# Patient Record
Sex: Male | Born: 1955 | Race: White | Hispanic: No | Marital: Married | Smoking: Never smoker
Health system: Southern US, Community
[De-identification: ages and names within clinical notes are randomized; demographics above are authoritative.]

## PROBLEM LIST (undated history)

## (undated) DIAGNOSIS — E559 Vitamin D deficiency, unspecified: Secondary | ICD-10-CM

## (undated) DIAGNOSIS — R011 Cardiac murmur, unspecified: Secondary | ICD-10-CM

## (undated) DIAGNOSIS — M1990 Unspecified osteoarthritis, unspecified site: Secondary | ICD-10-CM

## (undated) DIAGNOSIS — I2510 Atherosclerotic heart disease of native coronary artery without angina pectoris: Secondary | ICD-10-CM

## (undated) DIAGNOSIS — M109 Gout, unspecified: Secondary | ICD-10-CM

## (undated) DIAGNOSIS — E669 Obesity, unspecified: Secondary | ICD-10-CM

## (undated) DIAGNOSIS — I10 Essential (primary) hypertension: Secondary | ICD-10-CM

## (undated) DIAGNOSIS — E119 Type 2 diabetes mellitus without complications: Secondary | ICD-10-CM

## (undated) DIAGNOSIS — E785 Hyperlipidemia, unspecified: Secondary | ICD-10-CM

## (undated) DIAGNOSIS — N529 Male erectile dysfunction, unspecified: Secondary | ICD-10-CM

## (undated) HISTORY — DX: Obesity, unspecified: E66.9

## (undated) HISTORY — DX: Vitamin D deficiency, unspecified: E55.9

## (undated) HISTORY — PX: NO PAST SURGERIES: SHX2092

## (undated) HISTORY — DX: Type 2 diabetes mellitus without complications: E11.9

## (undated) HISTORY — PX: CARDIAC CATHETERIZATION: SHX172

## (undated) HISTORY — DX: Male erectile dysfunction, unspecified: N52.9

## (undated) HISTORY — DX: Hyperlipidemia, unspecified: E78.5

## (undated) HISTORY — DX: Gout, unspecified: M10.9

## (undated) HISTORY — DX: Unspecified osteoarthritis, unspecified site: M19.90

---

## 2015-01-07 ENCOUNTER — Other Ambulatory Visit: Payer: Self-pay | Admitting: Family Medicine

## 2015-01-07 ENCOUNTER — Ambulatory Visit (INDEPENDENT_AMBULATORY_CARE_PROVIDER_SITE_OTHER): Payer: No Typology Code available for payment source | Admitting: Family Medicine

## 2015-01-07 ENCOUNTER — Encounter: Payer: Self-pay | Admitting: Family Medicine

## 2015-01-07 VITALS — BP 130/72 | HR 72 | Temp 98.4°F | Resp 16 | Ht 71.5 in | Wt 280.8 lb

## 2015-01-07 DIAGNOSIS — E119 Type 2 diabetes mellitus without complications: Secondary | ICD-10-CM

## 2015-01-07 DIAGNOSIS — H6982 Other specified disorders of Eustachian tube, left ear: Secondary | ICD-10-CM | POA: Diagnosis not present

## 2015-01-07 DIAGNOSIS — N521 Erectile dysfunction due to diseases classified elsewhere: Secondary | ICD-10-CM | POA: Diagnosis not present

## 2015-01-07 DIAGNOSIS — N529 Male erectile dysfunction, unspecified: Secondary | ICD-10-CM

## 2015-01-07 DIAGNOSIS — E1169 Type 2 diabetes mellitus with other specified complication: Secondary | ICD-10-CM

## 2015-01-07 LAB — POCT GLYCOSYLATED HEMOGLOBIN (HGB A1C): Hemoglobin A1C: 7.2

## 2015-01-07 MED ORDER — SILDENAFIL CITRATE 50 MG PO TABS: 50.0000 mg | ORAL_TABLET | Freq: Every day | ORAL | Status: DC | PRN

## 2015-01-07 MED ORDER — FLUTICASONE PROPIONATE 50 MCG/ACT NA SUSP: 2.0000 | Freq: Every day | NASAL | Status: DC

## 2015-01-07 MED ORDER — METFORMIN HCL 500 MG PO TABS: 500.0000 mg | ORAL_TABLET | Freq: Two times a day (BID) | ORAL | Status: DC

## 2015-01-07 MED ORDER — SILDENAFIL CITRATE 20 MG PO TABS: ORAL_TABLET | ORAL | Status: DC

## 2015-01-07 NOTE — Progress Notes (Signed)
Subjective:     Patient ID: Jeffery West, male   DOB: [birth year], 59 y.o.   MRN: [id number]  HPI  Chief Complaint  Patient presents with  . Dizziness    Patient states that he woke up this morning and felt light headed. [name] states that he has had sinus pain/pressure above his eyes and that his left ear has felt "stopped up" for months.   States light headed feeling has improved. 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 He has been lost to f/u for diabetes, has gained weight and is no longer on medication (invokana) due to side effects. Also wishes refill on Viagra.   Review of Systems     Objective:   Physical Exam  Constitutional: He appears well-developed and well-nourished. No distress.  Ears: T.M's dull without inflammation Sinuses: mild frontal sinus tenderness Throat: no tonsillar enlargement or exudate Neck: no cervical adenopathy Lungs: clear     Assessment:    1. Type 2 diabetes mellitus without complication - POCT glycosylated hemoglobin (Hb A1C) - metFORMIN (GLUCOPHAGE) 500 MG tablet; Take 1 tablet (500 mg total) by mouth 2 (two) times daily with a meal.  Dispense: 60 tablet; Refill: 2  2. Eustachian tube dysfunction, left - fluticasone (FLONASE) 50 MCG/ACT nasal spray; Place 2 sprays into both nostrils daily.  Dispense: 16 g; Refill: 1  3. Erectile dysfunction associated with type 2 diabetes mellitus - sildenafil (VIAGRA) 50 MG tablet; Take 1 tablet (50 mg total) by mouth daily as needed for erectile dysfunction.  Dispense: 10 tablet; Refill: 2    Plan:    Schedule f/u or physical with your primary M.D, Dr. Rosanna Randy.

## 2015-01-07 NOTE — Patient Instructions (Signed)
Please schedule physical with Dr. Rosanna Randy

## 2015-01-24 ENCOUNTER — Encounter: Payer: Self-pay | Admitting: Family Medicine

## 2015-02-24 ENCOUNTER — Emergency Department: Payer: No Typology Code available for payment source

## 2015-02-24 ENCOUNTER — Emergency Department
Admission: EM | Admit: 2015-02-24 | Discharge: 2015-02-24 | Disposition: A | Discharge status: Home / Self Care | Payer: No Typology Code available for payment source | Attending: Emergency Medicine | Admitting: Emergency Medicine

## 2015-02-24 ENCOUNTER — Encounter: Payer: Self-pay | Admitting: Emergency Medicine

## 2015-02-24 DIAGNOSIS — Y9289 Other specified places as the place of occurrence of the external cause: Secondary | ICD-10-CM | POA: Diagnosis not present

## 2015-02-24 DIAGNOSIS — E1169 Type 2 diabetes mellitus with other specified complication: Secondary | ICD-10-CM | POA: Diagnosis not present

## 2015-02-24 DIAGNOSIS — Y990 Civilian activity done for income or pay: Secondary | ICD-10-CM | POA: Diagnosis not present

## 2015-02-24 DIAGNOSIS — Z79811 Long term (current) use of aromatase inhibitors: Secondary | ICD-10-CM | POA: Diagnosis not present

## 2015-02-24 DIAGNOSIS — X58XXXA Exposure to other specified factors, initial encounter: Secondary | ICD-10-CM | POA: Insufficient documentation

## 2015-02-24 DIAGNOSIS — Z79899 Other long term (current) drug therapy: Secondary | ICD-10-CM | POA: Insufficient documentation

## 2015-02-24 DIAGNOSIS — N521 Erectile dysfunction due to diseases classified elsewhere: Secondary | ICD-10-CM | POA: Insufficient documentation

## 2015-02-24 DIAGNOSIS — Y93F2 Activity, caregiving, lifting: Secondary | ICD-10-CM | POA: Diagnosis not present

## 2015-02-24 DIAGNOSIS — R1030 Lower abdominal pain, unspecified: Secondary | ICD-10-CM | POA: Diagnosis present

## 2015-02-24 DIAGNOSIS — Z880 Allergy status to penicillin: Secondary | ICD-10-CM | POA: Insufficient documentation

## 2015-02-24 DIAGNOSIS — S76212A Strain of adductor muscle, fascia and tendon of left thigh, initial encounter: Secondary | ICD-10-CM | POA: Insufficient documentation

## 2015-02-24 DIAGNOSIS — S76219A Strain of adductor muscle, fascia and tendon of unspecified thigh, initial encounter: Secondary | ICD-10-CM

## 2015-02-24 MED ORDER — CYCLOBENZAPRINE HCL 10 MG PO TABS
10.0000 mg | ORAL_TABLET | Freq: Once | ORAL | Status: AC
  Administered 2015-02-24: 10 mg via ORAL
  Filled 2015-02-24: qty 1

## 2015-02-24 MED ORDER — CYCLOBENZAPRINE HCL 10 MG PO TABS: 10.0000 mg | ORAL_TABLET | Freq: Three times a day (TID) | ORAL | Status: DC | PRN

## 2015-02-24 NOTE — Discharge Instructions (Signed)
Groin Strain °A groin strain (also called a groin pull) is an injury to the muscles or tendon on the upper inner part of the thigh. These muscles are called the adductor muscles or groin muscles. They are responsible for moving the leg across the body. A muscle strain occurs when a muscle is overstretched and some muscle fibers are torn. A groin strain can range from mild to severe depending on how many muscle fibers are affected and whether the muscle fibers are partially or completely torn.  °Groin strains usually occur during exercise or participation in sports. The injury often happens when a sudden, violent force is placed on a muscle, stretching the muscle too far. A strain is more likely to occur when your muscles are not warmed up or if you are not properly conditioned. Depending on the severity of the groin strain, recovery time may vary from a few weeks to several weeks. Severe injuries often require 4-6 weeks for recovery. In these cases, complete healing can take 4-5 months.  °CAUSES  °· Stretching the groin muscles too far or too suddenly, often during side-to-side motion with an abrupt change in direction. °· Putting repeated stress on the groin muscles over a long period of time. °· Performing vigorous activity without properly stretching the groin muscles beforehand. °SYMPTOMS  °· Pain and tenderness in the groin area. This begins as sharp pain and persists as a dull ache. °· Popping or snapping feeling when the injury occurs (for severe strains). °· Swelling or bruising. °· Muscle spasms. °· Weakness in the leg. °· Stiffness in the groin area with decreased ability to move the affected muscles. °DIAGNOSIS  °Your caregiver will perform a physical exam to diagnose a groin strain. You will be asked about your symptoms and how the injury occurred. X-rays are sometimes needed to rule out a broken bone or cartilage problems. Your caregiver may order a CT scan or MRI if a complete muscle tear is  suspected. °TREATMENT  °A groin strain will often heal on its own. Your caregiver may prescribe medicines to help manage pain and swelling (anti-inflammatory medicine). You may be told to use crutches for the first few days to minimize your pain. °HOME CARE INSTRUCTIONS  °· Rest. Do not use the strained muscle if it causes pain. °· Put ice on the injured area. °¨ Put ice in a plastic bag. °¨ Place a towel between your skin and the bag. °¨ Leave the ice on for 15-20 minutes, every 2-3 hours. Do this for the first 2 days after the injury.  °· Only take over-the-counter or prescription medicines as directed by your caregiver. °· Wrap the injured area with an elastic bandage as directed by your caregiver. °· Keep the injured leg raised (elevated). °· Walk, stretch, and perform range-of-motion exercises to improve blood flow to the injured area. Only perform these activities if you can do so without any pain. °To prevent muscle strains: °· Warm up before exercise. °· Develop proper conditioning and strength in the groin muscles. °SEEK IMMEDIATE MEDICAL CARE IF:  °· You have increased pain or swelling in the affected area.   °· Your symptoms are not improving or are getting worse. °MAKE SURE YOU:  °· Understand these instructions. °· Will watch your condition. °· Will get help right away if you are not doing well or get worse. °Document Released: 02/03/2004 Document Revised: 05/24/2012 Document Reviewed: 02/09/2012 °[employer]® Patient Information ©2015 [employer]. This information is not intended to replace advice given to you   by your health care provider. Make sure you discuss any questions you have with your health care provider. ° °

## 2015-02-24 NOTE — ED Notes (Signed)
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

## 2015-02-24 NOTE — ED Provider Notes (Signed)
[facility] Emergency Department Provider Note  ____________________________________________  Time seen: Approximately 1050AM  I have reviewed the triage vital signs and the nursing notes.   HISTORY  Chief Complaint Groin Pain    HPI Jeffery West is a 59 y.o. male with a history of diabetes who is presenting today with left-sided groin pain over the past 2 weeks. 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 However, over the past 2 weeks she has been having waxing and waning pain to his left groin and hip. He says that it is worse with movement, especially when standing up. He says that when he is lying still, like right now, he has no pain at all. He denies any urinary complaints. Has no history of kidney stones. He does admit to some left lower back pain as well but only mild. No loss of bowel or bladder continence. Has tried muscle relaxers at home as well as Naprosyn without relief.He denies any bulge to his groin or visible hernia sac.   Past Medical History  Diagnosis Date  . Diabetes mellitus without complication   . Vitamin D deficiency   . Hyperlipidemia   . Asthma   . Gout   . Osteoarthritis   . Erectile dysfunction   . Obesity     Patient Active Problem List   Diagnosis Date Noted  . Erectile dysfunction associated with type 2 diabetes mellitus 01/07/2015    History reviewed. No pertinent past surgical history.  Current Outpatient Rx  Name  Route  Sig  Dispense  Refill  . albuterol (PROVENTIL) (2.5 MG/3ML) 0.083% nebulizer solution   Inhalation   Inhale 2.5 mg into the lungs every 4 (four) hours as needed for wheezing or shortness of breath.         . allopurinol (ZYLOPRIM) 300 MG tablet   Oral   Take 300 mg by mouth daily.         . fluticasone (FLONASE) 50 MCG/ACT nasal spray   Each Nare   Place 2 sprays into both nostrils daily.   16 g   1   .  metFORMIN (GLUCOPHAGE) 500 MG tablet   Oral   Take 1 tablet (500 mg total) by mouth 2 (two) times daily with a meal.   60 tablet   2   . naproxen sodium (ANAPROX) 220 MG tablet   Oral   Take 220 mg by mouth daily.         . sildenafil (REVATIO) 20 MG tablet      Take 2-3 tablets daily as needed for E.D.   20 tablet   5   . simvastatin (ZOCOR) 20 MG tablet   Oral   Take 20 mg by mouth daily.           Allergies Penicillins  Family History  Problem Relation Age of Onset  . Cancer Father     possibly in liver and lung    Social History Social History  Substance Use Topics  . Smoking status: Never Smoker   . Smokeless tobacco: Never Used  . Alcohol Use: None    Review of Systems Constitutional: No fever/chills Eyes: No visual changes. ENT: No sore throat. Cardiovascular: Denies chest pain. Respiratory: Denies shortness of breath. Gastrointestinal: No abdominal pain.  No nausea, no vomiting.  No diarrhea.  No constipation. Genitourinary: Negative for dysuria. Musculoskeletal: As above Skin: Negative for  rash. Neurological: Negative for headaches, focal weakness or numbness.  10-point ROS otherwise negative.  ____________________________________________   PHYSICAL EXAM:  VITAL SIGNS: ED Triage Vitals  Enc Vitals Group     BP 02/24/15 0923 158/79 mmHg     Pulse Rate 02/24/15 0923 80     Resp 02/24/15 0923 18     Temp 02/24/15 0923 98.2 F (36.8 C)     Temp Source 02/24/15 0923 Oral     SpO2 02/24/15 0923 100 %     Weight 02/24/15 0923 270 lb (122.471 kg)     Height 02/24/15 0923 5\' 11"  (1.803 m)     Head Cir --      Peak Flow --      Pain Score 02/24/15 0923 8     Pain Loc --      Pain Edu? --      Excl. in [place]? --     Constitutional: Alert and oriented. Well appearing and in no acute distress. Eyes: Conjunctivae are normal. PERRL. EOMI. Head: Atraumatic. Nose: No congestion/rhinnorhea. Mouth/Throat: Mucous membranes are moist.  Oropharynx  non-erythematous. Neck: No stridor.   Cardiovascular: Normal rate, regular rhythm. Grossly normal heart sounds.  Good peripheral circulation. Respiratory: Normal respiratory effort.  No retractions. Lungs CTAB. Gastrointestinal: Soft and nontender. No distention. No abdominal bruits. No CVA tenderness. Genitourinary: No testicular tenderness or masses palpated. No hernia sac. Groin palpated with moderate point tenderness to the left inguinal area without any palpable mass. The tenderness is medial to the inguinal ligament. Musculoskeletal: No lower extremity tenderness nor edema.  No joint effusions. No tenderness to the back or step-off to the spine. Neurologic:  Normal speech and language. No Zentner focal neurologic deficits are appreciated. No gait instability. Able to ambulate without any visible signs of pain with a normal gait. Skin:  Skin is warm, dry and intact. No rash noted. Psychiatric: Mood and affect are normal. Speech and behavior are normal.  ____________________________________________   LABS (all labs ordered are listed, but only abnormal results are displayed)  Labs Reviewed - No data to display ____________________________________________  EKG   ____________________________________________  RADIOLOGY  No visible hernia sac on the ultrasound of the affected area. Hip x-ray with mild osteoarthritis bilaterally. Small 6 m sclerotic lesion in the left proximal femoral diaphysis likely presenting a bone island in the absence of known malignancy.  ____________________________________________   PROCEDURES  ____________________________________________   INITIAL IMPRESSION / ASSESSMENT AND PLAN / ED COURSE  Pertinent labs & imaging results that were available during my care of the patient were reviewed by me and considered in my medical decision making (see chart for details).  ----------------------------------------- 1:16 PM on  02/24/2015 -----------------------------------------  Patient is resting comfortable at this time. Reviewed the radiologic results with him including the x-ray with the bone island. He was given a copy of the radiology report will take with him to his primary care doctor appointment in 2 weeks. The pain does seem to be musculoskeletal in origin. 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 He continues to rest comfortably when not moving and reiterates that the pain is only with motion. I believe this represent groin strain. In addition to the patient's symptomatically treatment I also recommended rest. He will likely take 2-3 days off of work so that he may rest the muscles. ____________________________________________   FINAL CLINICAL IMPRESSION(S) / ED DIAGNOSES  Acute groin strain. Initial  visit.    Orbie Pyo, MD 02/24/15 [id number]

## 2015-02-24 NOTE — ED Notes (Signed)
Patient transported to Ultrasound 

## 2015-02-26 ENCOUNTER — Encounter: Payer: Self-pay | Admitting: Family Medicine

## 2015-02-26 ENCOUNTER — Telehealth: Payer: Self-pay | Admitting: Family Medicine

## 2015-02-26 ENCOUNTER — Ambulatory Visit (INDEPENDENT_AMBULATORY_CARE_PROVIDER_SITE_OTHER): Payer: No Typology Code available for payment source | Admitting: Family Medicine

## 2015-02-26 VITALS — BP 162/60 | HR 78 | Temp 97.5°F | Resp 16 | Wt 271.0 lb

## 2015-02-26 DIAGNOSIS — E785 Hyperlipidemia, unspecified: Secondary | ICD-10-CM | POA: Insufficient documentation

## 2015-02-26 DIAGNOSIS — F4320 Adjustment disorder, unspecified: Secondary | ICD-10-CM | POA: Insufficient documentation

## 2015-02-26 DIAGNOSIS — E66811 Obesity, class 1: Secondary | ICD-10-CM | POA: Insufficient documentation

## 2015-02-26 DIAGNOSIS — M1990 Unspecified osteoarthritis, unspecified site: Secondary | ICD-10-CM | POA: Insufficient documentation

## 2015-02-26 DIAGNOSIS — E1169 Type 2 diabetes mellitus with other specified complication: Secondary | ICD-10-CM | POA: Insufficient documentation

## 2015-02-26 DIAGNOSIS — N529 Male erectile dysfunction, unspecified: Secondary | ICD-10-CM | POA: Insufficient documentation

## 2015-02-26 DIAGNOSIS — J45909 Unspecified asthma, uncomplicated: Secondary | ICD-10-CM | POA: Insufficient documentation

## 2015-02-26 DIAGNOSIS — M109 Gout, unspecified: Secondary | ICD-10-CM | POA: Insufficient documentation

## 2015-02-26 DIAGNOSIS — E119 Type 2 diabetes mellitus without complications: Secondary | ICD-10-CM | POA: Insufficient documentation

## 2015-02-26 DIAGNOSIS — S39011S Strain of muscle, fascia and tendon of abdomen, sequela: Secondary | ICD-10-CM | POA: Diagnosis not present

## 2015-02-26 DIAGNOSIS — M549 Dorsalgia, unspecified: Secondary | ICD-10-CM | POA: Diagnosis not present

## 2015-02-26 DIAGNOSIS — E559 Vitamin D deficiency, unspecified: Secondary | ICD-10-CM | POA: Insufficient documentation

## 2015-02-26 DIAGNOSIS — S76219S Strain of adductor muscle, fascia and tendon of unspecified thigh, sequela: Secondary | ICD-10-CM

## 2015-02-26 DIAGNOSIS — J309 Allergic rhinitis, unspecified: Secondary | ICD-10-CM | POA: Insufficient documentation

## 2015-02-26 DIAGNOSIS — E1165 Type 2 diabetes mellitus with hyperglycemia: Secondary | ICD-10-CM | POA: Insufficient documentation

## 2015-02-26 MED ORDER — PREDNISONE 10 MG (48) PO TBPK: ORAL_TABLET | Freq: Every day | ORAL | Status: DC

## 2015-02-26 NOTE — Progress Notes (Signed)
Patient ID: Jeffery West, male   DOB: 22-Dec-1955, 58 y.o.   MRN: [id number]    Subjective:  HPI Pt was in the ER on 02/24/15 for left groin pain that had been going on 2 weeks prior to going to the ER. He had been lifting heavy objects at work. He reports that the pain is worse with movement or raising his leg. He has tried muscle relaxers and Naproxen. He describes the pain as a an "ache" and its not really too much pain until he gets up walks for goes from sitting to standing an vice versa.  The ER did labs and they were all normal. He had a u/s of the affected area and a xray of the right hip. A small 6 m sclerotic lesion in the left proximal femoral diaphysis likely presenting a bone island in the absence of known malignancy. Pt is still in pain today and is obvious in the room he can not stand or sit for long periods of time.   Prior to Admission medications   Medication Sig Start Date End Date Taking? Authorizing Provider  albuterol (PROAIR HFA) 108 (90 BASE) MCG/ACT inhaler Inhale into the lungs. 06/18/14  Yes Historical Provider, MD  allopurinol (ZYLOPRIM) 300 MG tablet Take by mouth. 05/06/14  Yes Historical Provider, MD  cyclobenzaprine (FLEXERIL) 10 MG tablet Take 1 tablet (10 mg total) by mouth 3 (three) times daily as needed for muscle spasms. 02/24/15  Yes Orbie Pyo, MD  fluticasone [facility]) 50 MCG/ACT nasal spray Place 2 sprays into both nostrils daily. 01/07/15  Yes Carmon Ginsberg, PA  loratadine ([facility]) 10 MG tablet Take by mouth.   Yes Historical Provider, MD  metFORMIN (GLUCOPHAGE) 500 MG tablet Take 1 tablet (500 mg total) by mouth 2 (two) times daily with a meal. 01/07/15  Yes Carmon Ginsberg, PA  Naproxen Sodium 220 MG CAPS Take by mouth.   Yes Historical Provider, MD  sildenafil (REVATIO) 20 MG tablet Take 2-3 tablets daily as needed for E.D. 01/07/15  Yes Carmon Ginsberg, PA  simvastatin (ZOCOR) 20 MG tablet Take by mouth. 04/26/14  Yes Historical Provider,  MD    Patient Active Problem List   Diagnosis Date Noted  . AB (asthmatic bronchitis) 02/26/2015  . Adaptation reaction 02/26/2015  . Allergic rhinitis 02/26/2015  . Diabetes 02/26/2015  . ED (erectile dysfunction) of organic origin 02/26/2015  . Gout 02/26/2015  . HLD (hyperlipidemia) 02/26/2015  . Adiposity 02/26/2015  . Arthritis, degenerative 02/26/2015  . Avitaminosis D 02/26/2015  . Erectile dysfunction associated with type 2 diabetes mellitus 01/07/2015    Past Medical History  Diagnosis Date  . Diabetes mellitus without complication   . Vitamin D deficiency   . Hyperlipidemia   . Asthma   . Gout   . Osteoarthritis   . Erectile dysfunction   . Obesity     Social History   Social History  . Marital Status: Married    Spouse Name: N/A  . Number of Children: N/A  . Years of Education: N/A   Occupational History  . Not on file.   Social History Main Topics  . Smoking status: Current Some Day Smoker    Types: Cigars  . Smokeless tobacco: Never Used     Comment: every once in a while  . Alcohol Use: 0.0 oz/week    0 Standard drinks or equivalent per week     Comment: occassionally  . Drug Use: No  . Sexual Activity: Not  on file   Other Topics Concern  . Not on file   Social History Narrative    Allergies  Allergen Reactions  . Penicillins Hives    Review of Systems  Constitutional: Negative.   HENT: Negative.   Eyes: Negative.   Respiratory: Negative.   Cardiovascular: Negative.   Gastrointestinal: Negative.   Genitourinary: Negative.   Musculoskeletal: Positive for myalgias and joint pain.  Skin: Negative.   Neurological: Negative.   Endo/Heme/Allergies: Negative.   Psychiatric/Behavioral: Negative.      There is no immunization history on file for this patient. Objective:  BP 162/60 mmHg  Pulse 78  Temp(Src) 97.5 F (36.4 C) (Oral)  Resp 16  Wt 271 lb (122.925 kg)  Physical Exam  Constitutional: He is oriented to person,  place, and time and well-developed, well-nourished, and in no distress.  Morbidly obese white male in no acute distress  HENT:  Head: Normocephalic and atraumatic.  Right Ear: External ear normal.  Left Ear: External ear normal.  Nose: Nose normal.  Eyes: Conjunctivae are normal.  Neck: Neck supple.  Cardiovascular: Normal rate, regular rhythm and normal heart sounds.   2/6 systolic murmur at the left lower sternal border  Pulmonary/Chest: Effort normal and breath sounds normal.  Abdominal: Soft.  Musculoskeletal: Normal range of motion.  Patient has full range of motion in his back and hip with negative figure-of-four maneuver. He does have some discomfort with active movement in his back and groin and hip flexors  Neurological: He is alert and oriented to person, place, and time.  Skin: Skin is warm and dry.  Psychiatric: Mood, memory, affect and judgment normal.    Lab Results  Component Value Date   HGBA1C 7.2 01/07/2015    CMP  No results found for: NA, K, CL, CO2, GLUCOSE, BUN, CREATININE, CALCIUM, PROT, ALBUMIN, AST, ALT, ALKPHOS, BILITOT, GFRNONAA, GFRAA  Assessment and Plan :  1. Groin strain, sequela I think this pain is all musculoskeletal pain. May need orthopedic referral or physical therapy referral but I do not think he'll need a repeat surgical intervention. I do not think he needs any further imaging at this time. - predniSONE (STERAPRED UNI-PAK 48 TAB) 10 MG (48) TBPK tablet; Take by mouth daily.  Dispense: 48 tablet; Refill: 0 - Ambulatory referral to Orthopedic Surgery  2. Back pain, unspecified location  - predniSONE (STERAPRED UNI-PAK 48 TAB) 10 MG (48) TBPK tablet; Take by mouth daily.  Dispense: 48 tablet; Refill: 0 - Ambulatory referral to Orthopedic Surgery 3. Morbid obesity with deconditioning Patient will return to work when he is able to. Discussed that he would do well to lose weight and strengthen his core as this may become a more frequent issue  for him  Miguel Aschoff MD [facility] 02/26/2015 2:39 PM

## 2015-02-26 NOTE — Telephone Encounter (Signed)
Pt wa in the ER Monday LKeft Hip pain.  They did [place] and Xray.  With some various findings.  They gave him flexaril and told him to follow up with you this week or next.  He says he needs to see you today.  He is in a lot of pain. Is there any time we can do

## 2015-02-28 NOTE — Telephone Encounter (Signed)
Information faxed and patient was seen on the 7th-aa

## 2015-02-28 NOTE — Telephone Encounter (Signed)
[facility] calling, [place], they need information  Faxed number [phone]. Need any OV notes pertaining to hip issues and his copy of insurance card also please.

## 2015-03-06 ENCOUNTER — Telehealth: Payer: Self-pay | Admitting: Family Medicine

## 2015-03-06 NOTE — Telephone Encounter (Signed)
Pt contacted office for refill request on the following medications:  cyclobenzaprine (FLEXERIL) 10 MG.  [facility] [place].  CB#[phone]/MW

## 2015-03-06 NOTE — Telephone Encounter (Signed)
See below please-aa 

## 2015-03-07 NOTE — Telephone Encounter (Signed)
Okay-3 times a day when necessary, #30, 3 refills

## 2015-03-10 ENCOUNTER — Other Ambulatory Visit: Payer: Self-pay

## 2015-03-10 MED ORDER — CYCLOBENZAPRINE HCL 10 MG PO TABS: 10.0000 mg | ORAL_TABLET | Freq: Three times a day (TID) | ORAL | Status: DC | PRN

## 2015-03-13 ENCOUNTER — Encounter: Payer: No Typology Code available for payment source | Admitting: Family Medicine

## 2015-05-20 ENCOUNTER — Other Ambulatory Visit: Payer: Self-pay | Admitting: Family Medicine

## 2015-05-21 ENCOUNTER — Other Ambulatory Visit: Payer: Self-pay

## 2015-05-21 DIAGNOSIS — H6982 Other specified disorders of Eustachian tube, left ear: Secondary | ICD-10-CM

## 2015-05-21 DIAGNOSIS — H6992 Unspecified Eustachian tube disorder, left ear: Secondary | ICD-10-CM

## 2015-05-21 MED ORDER — FLUTICASONE PROPIONATE 50 MCG/ACT NA SUSP: 2.0000 | Freq: Every day | NASAL | Status: DC

## 2015-06-04 ENCOUNTER — Encounter: Payer: Self-pay | Admitting: Family Medicine

## 2015-06-04 ENCOUNTER — Ambulatory Visit (INDEPENDENT_AMBULATORY_CARE_PROVIDER_SITE_OTHER): Payer: Commercial Managed Care - HMO | Admitting: Family Medicine

## 2015-06-04 VITALS — BP 142/64 | HR 78 | Temp 98.5°F | Resp 20 | Ht 71.0 in | Wt 276.0 lb

## 2015-06-04 DIAGNOSIS — E118 Type 2 diabetes mellitus with unspecified complications: Secondary | ICD-10-CM | POA: Diagnosis not present

## 2015-06-04 DIAGNOSIS — M109 Gout, unspecified: Secondary | ICD-10-CM

## 2015-06-04 DIAGNOSIS — J019 Acute sinusitis, unspecified: Secondary | ICD-10-CM

## 2015-06-04 DIAGNOSIS — J40 Bronchitis, not specified as acute or chronic: Secondary | ICD-10-CM

## 2015-06-04 DIAGNOSIS — E785 Hyperlipidemia, unspecified: Secondary | ICD-10-CM | POA: Diagnosis not present

## 2015-06-04 DIAGNOSIS — H6982 Other specified disorders of Eustachian tube, left ear: Secondary | ICD-10-CM

## 2015-06-04 LAB — POCT GLYCOSYLATED HEMOGLOBIN (HGB A1C): Hemoglobin A1C: 7.7

## 2015-06-04 MED ORDER — FLUTICASONE PROPIONATE 50 MCG/ACT NA SUSP: 2.0000 | Freq: Every day | NASAL | Status: DC

## 2015-06-04 MED ORDER — AZITHROMYCIN 250 MG PO TABS: ORAL_TABLET | ORAL | Status: DC

## 2015-06-04 MED ORDER — ALLOPURINOL 300 MG PO TABS: 300.0000 mg | ORAL_TABLET | Freq: Every day | ORAL | Status: DC

## 2015-06-04 MED ORDER — LORATADINE 10 MG PO TABS: 10.0000 mg | ORAL_TABLET | Freq: Every day | ORAL | Status: DC

## 2015-06-04 MED ORDER — ALBUTEROL SULFATE HFA 108 (90 BASE) MCG/ACT IN AERS: 1.0000 | INHALATION_SPRAY | Freq: Four times a day (QID) | RESPIRATORY_TRACT | Status: DC | PRN

## 2015-06-04 MED ORDER — SIMVASTATIN 20 MG PO TABS: 20.0000 mg | ORAL_TABLET | Freq: Every day | ORAL | Status: DC

## 2015-06-04 NOTE — Progress Notes (Signed)
Patient ID: Jeffery West, male   DOB: 1955-10-17, 59 y.o.   MRN: [id number]       Patient: Jeffery West Male    DOB: January 03, 1956   59 y.o.   MRN: [id number] Visit Date: 06/04/2015  Today's Provider: [name], MD   Chief Complaint  Patient presents with  . Diabetes  . Hyperlipidemia  . URI   Subjective:    Hyperlipidemia This is a chronic problem. Pertinent negatives include no chest pain, focal sensory loss, focal weakness, leg pain, myalgias or shortness of breath. Current antihyperlipidemic treatment includes statins. There are no compliance problems.   URI  This is a new problem. The current episode started in the past 7 days. The problem has been unchanged. There has been no fever. Associated symptoms include coughing, headaches, a plugged ear sensation, rhinorrhea, sinus pain, sneezing, a sore throat and wheezing. Pertinent negatives include no chest pain. He has tried antihistamine and decongestant for the symptoms. The treatment provided mild relief.    Diabetes Mellitus Type II, Follow-up:   Lab Results  Component Value Date   HGBA1C 7.7 06/04/2015   HGBA1C 7.2 01/07/2015    Last seen for diabetes 5 months ago.  Management since then includes change in diet and exercise. He reports fair compliance with treatment. He is not having side effects.  Home blood sugar records: not being checked.   Episodes of hypoglycemia? yes    Current Insulin Regimen: none Weight trend: increasing steadily Current diet: in general, an "unhealthy" diet Current exercise: none  Pertinent Labs: No results found for: CHOL, TRIG, HDL, LDLCALC, CREATININE  Wt Readings from Last 3 Encounters:  06/04/15 276 lb (125.193 kg)  02/26/15 271 lb (122.925 kg)  02/24/15 270 lb (122.471 kg)    ------------------------------------------------------------------------       Allergies  Allergen Reactions  . Penicillins Hives   Previous Medications   ALBUTEROL (PROAIR HFA) 108  (90 BASE) MCG/ACT INHALER    Inhale into the lungs.   ALLOPURINOL (ZYLOPRIM) 300 MG TABLET    TAKE ONE TABLET EVERY DAY   CYCLOBENZAPRINE (FLEXERIL) 10 MG TABLET    Take 1 tablet (10 mg total) by mouth 3 (three) times daily as needed for muscle spasms.   FLUTICASONE (FLONASE) 50 MCG/ACT NASAL SPRAY    Place 2 sprays into both nostrils daily.   LORATADINE ([facility] ALLERGY RELIEF) 10 MG TABLET    Take by mouth.   METFORMIN (GLUCOPHAGE) 500 MG TABLET    Take 1 tablet (500 mg total) by mouth 2 (two) times daily with a meal.   NAPROXEN SODIUM 220 MG CAPS    Take by mouth.   PREDNISONE (STERAPRED UNI-PAK 48 TAB) 10 MG (48) TBPK TABLET    Take by mouth daily.   SILDENAFIL (REVATIO) 20 MG TABLET    Take 2-3 tablets daily as needed for E.D.   SIMVASTATIN (ZOCOR) 20 MG TABLET    TAKE ONE TABLET BY MOUTH EVERY DAY    Review of Systems  HENT: Positive for rhinorrhea, sneezing and sore throat.   Respiratory: Positive for cough and wheezing. Negative for shortness of breath.   Cardiovascular: Negative for chest pain.  Endocrine: Negative.   Musculoskeletal: Positive for back pain. Negative for myalgias.  Allergic/Immunologic: Negative.   Neurological: Positive for headaches. Negative for focal weakness.  Psychiatric/Behavioral: Negative.     Social History  Substance Use Topics  . Smoking status: Current Some Day Smoker    Types: Cigars  . Smokeless  tobacco: Never Used     Comment: every once in a while  . Alcohol Use: 0.0 oz/week    0 Standard drinks or equivalent per week     Comment: occassionally   Objective:   BP 142/64 mmHg  Pulse 78  Temp(Src) 98.5 F (36.9 C)  Resp 20  Ht 5\' 11"  (1.803 m)  Wt 276 lb (125.193 kg)  BMI 38.51 kg/m2  Physical Exam  Constitutional: He is oriented to person, place, and time. He appears well-developed and well-nourished.  HENT:  Head: Normocephalic and atraumatic.  Right Ear: External ear normal.  Left Ear: External ear normal.  Nose: Nose normal.    Mouth/Throat: Oropharynx is clear and moist.  Eyes: Conjunctivae and EOM are normal. Pupils are equal, round, and reactive to light.  Neck: Normal range of motion. Neck supple.  Cardiovascular: Normal rate and regular rhythm.   Murmur heard. 2/6 systolic murmur  Pulmonary/Chest: Effort normal and breath sounds normal.  Neurological: He is alert and oriented to person, place, and time. He has normal reflexes.  Skin: Skin is warm and dry.  Psychiatric: He has a normal mood and affect. His behavior is normal. Judgment and thought content normal.  Nursing note and vitals reviewed.       Assessment & Plan:     1. Type 2 diabetes mellitus with complication, without long-term current use of insulin (HCC)  - POCT glycosylated hemoglobin (Hb A1C) - CBC with Differential/Platelet - Comprehensive metabolic panel - TSH - allopurinol (ZYLOPRIM) 300 MG tablet; Take 1 tablet (300 mg total) by mouth daily.  Dispense: 90 tablet; Refill: 0  2. Acute sinusitis, recurrence not specified, unspecified location   3. Bronchitis Zpak for now although likely viral. Pt very concerned as he might lose insurance end of year. - CBC with Differential/Platelet - loratadine ([facility] ALLERGY RELIEF) 10 MG tablet; Take 1 tablet (10 mg total) by mouth daily.  Dispense: 90 tablet; Refill: 0 - albuterol (PROAIR HFA) 108 (90 BASE) MCG/ACT inhaler; Inhale 1 puff into the lungs every 6 (six) hours as needed for wheezing or shortness of breath.  Dispense: 1 Inhaler; Refill: 3  4. HLD (hyperlipidemia)  - CBC with Differential/Platelet - Comprehensive metabolic panel - Lipid panel - TSH - simvastatin (ZOCOR) 20 MG tablet; Take 1 tablet (20 mg total) by mouth daily.  Dispense: 90 tablet; Refill: 0  5. Eustachian tube dysfunction, left  - fluticasone (FLONASE) 50 MCG/ACT nasal spray; Place 2 sprays into both nostrils daily.  Dispense: 16 g; Refill: 12 - loratadine ([facility] ALLERGY RELIEF) 10 MG tablet; Take 1 tablet (10 mg  total) by mouth daily.  Dispense: 90 tablet; Refill: 0  6. Gout without tophus, unspecified cause, unspecified chronicity, unspecified site 7.Morbid Obesity        [name], MD  [facility]

## 2015-06-05 ENCOUNTER — Telehealth: Payer: Self-pay

## 2015-06-05 LAB — CBC WITH DIFFERENTIAL/PLATELET
BASOS ABS: 0.1 10*3/uL (ref 0.0–0.2)
Basos: 1 %
EOS (ABSOLUTE): 0.3 10*3/uL (ref 0.0–0.4)
EOS: 4 %
Hematocrit: 45.2 % (ref 37.5–51.0)
Hemoglobin: 15.5 g/dL (ref 12.6–17.7)
IMMATURE GRANULOCYTES: 1 %
Immature Grans (Abs): 0.1 10*3/uL (ref 0.0–0.1)
LYMPHS ABS: 2.9 10*3/uL (ref 0.7–3.1)
Lymphs: 40 %
MCH: 30.5 pg (ref 26.6–33.0)
MCHC: 34.3 g/dL (ref 31.5–35.7)
MCV: 89 fL (ref 79–97)
MONOS ABS: 0.6 10*3/uL (ref 0.1–0.9)
Monocytes: 8 %
NEUTROS PCT: 46 %
Neutrophils Absolute: 3.4 10*3/uL (ref 1.4–7.0)
PLATELETS: 171 10*3/uL (ref 150–379)
RBC: 5.09 x10E6/uL (ref 4.14–5.80)
RDW: 13.8 % (ref 12.3–15.4)
WBC: 7.3 10*3/uL (ref 3.4–10.8)

## 2015-06-05 LAB — COMPREHENSIVE METABOLIC PANEL
A/G RATIO: 1.8 (ref 1.1–2.5)
ALK PHOS: 85 IU/L (ref 39–117)
ALT: 48 IU/L — AB (ref 0–44)
AST: 44 IU/L — AB (ref 0–40)
Albumin: 4.6 g/dL (ref 3.5–5.5)
BILIRUBIN TOTAL: 0.6 mg/dL (ref 0.0–1.2)
BUN/Creatinine Ratio: 14 (ref 9–20)
BUN: 11 mg/dL (ref 6–24)
CHLORIDE: 97 mmol/L (ref 96–106)
CO2: 27 mmol/L (ref 18–29)
Calcium: 10 mg/dL (ref 8.7–10.2)
Creatinine, Ser: 0.78 mg/dL (ref 0.76–1.27)
GFR calc Af Amer: 115 mL/min/{1.73_m2} (ref >59)
GFR calc non Af Amer: 99 mL/min/{1.73_m2} (ref >59)
Globulin, Total: 2.6 g/dL (ref 1.5–4.5)
Glucose: 190 mg/dL — ABNORMAL HIGH (ref 65–99)
POTASSIUM: 4.6 mmol/L (ref 3.5–5.2)
Sodium: 140 mmol/L (ref 134–144)
Total Protein: 7.2 g/dL (ref 6.0–8.5)

## 2015-06-05 LAB — LIPID PANEL
Chol/HDL Ratio: 5.5 ratio units — ABNORMAL HIGH (ref 0.0–5.0)
Cholesterol, Total: 188 mg/dL (ref 100–199)
HDL: 34 mg/dL — AB (ref >39)
LDL Calculated: 95 mg/dL (ref 0–99)
TRIGLYCERIDES: 293 mg/dL — AB (ref 0–149)
VLDL Cholesterol Cal: 59 mg/dL — ABNORMAL HIGH (ref 5–40)

## 2015-06-05 LAB — TSH: TSH: 2.77 u[IU]/mL (ref 0.450–4.500)

## 2015-06-05 NOTE — Telephone Encounter (Signed)
[id number] 

## 2015-06-05 NOTE — Telephone Encounter (Signed)
-----   Message from Jerrol Banana., MD sent at 06/05/2015  7:55 AM EST ----- Labs stable. Mild liver inflammation from fatty liver. The treatment for this is weight loss.

## 2015-06-05 NOTE — Telephone Encounter (Signed)
Pt advised-aa 

## 2015-06-20 ENCOUNTER — Ambulatory Visit (INDEPENDENT_AMBULATORY_CARE_PROVIDER_SITE_OTHER): Payer: Commercial Managed Care - HMO | Admitting: Family Medicine

## 2015-06-20 ENCOUNTER — Encounter: Payer: Self-pay | Admitting: Family Medicine

## 2015-06-20 VITALS — BP 158/80 | HR 92 | Temp 98.3°F | Resp 18 | Wt 282.0 lb

## 2015-06-20 DIAGNOSIS — J4520 Mild intermittent asthma, uncomplicated: Secondary | ICD-10-CM

## 2015-06-20 DIAGNOSIS — J683 Other acute and subacute respiratory conditions due to chemicals, gases, fumes and vapors: Secondary | ICD-10-CM

## 2015-06-20 MED ORDER — PREDNISONE 10 MG PO TABS: ORAL_TABLET | ORAL | Status: DC

## 2015-06-20 NOTE — Patient Instructions (Signed)
Continue expectorants 

## 2015-06-20 NOTE — Progress Notes (Signed)
Subjective:     Patient ID: Jeffery West, male   DOB: [birth year], 59 y.o.   MRN: [id number]  HPI  Chief Complaint  Patient presents with  . Wheezing    also has cough. Was treated about 2 weeks ago by Dr. Rosanna Randy with a Zpak. Improved but then got worse again.  States he continues to wheeze and feel some congestion in his airways he can't get up. States sinuses have cleared. Using the albuterol inhaler before bed and has used prior to his office visit.   Review of Systems  Cardiovascular:       Systolic murmur noted at last visit.He will discuss further at next o.v.with Dr. Rosanna Randy unless he develops additional symptoms prior to his appointment time.       Objective:   Physical Exam  Constitutional: He appears well-developed and well-nourished. No distress.  Cardiovascular:  Murmur heard.  Systolic murmur is present with a grade of 2/6  Ears: T.M's intact without inflammation Throat: no tonsillar enlargement or exudate Neck: no cervical adenopathy Lungs: clear     Assessment:    1. Reactive airways dysfunction syndrome, mild intermittent, uncomplicated - predniSONE (DELTASONE) 10 MG tablet; Taper daily as follows: 6 pills, 5, 4, 3, 2, 1  Dispense: 21 tablet; Refill: 0    Plan:    Continue expectorant

## 2015-07-10 ENCOUNTER — Encounter: Payer: Self-pay | Admitting: Emergency Medicine

## 2015-09-11 ENCOUNTER — Other Ambulatory Visit: Payer: Self-pay

## 2015-09-11 DIAGNOSIS — Z1211 Encounter for screening for malignant neoplasm of colon: Secondary | ICD-10-CM

## 2015-09-19 ENCOUNTER — Other Ambulatory Visit: Payer: Self-pay | Admitting: Family Medicine

## 2015-09-23 ENCOUNTER — Other Ambulatory Visit: Payer: Self-pay | Admitting: Family Medicine

## 2015-09-24 ENCOUNTER — Other Ambulatory Visit: Payer: Self-pay | Admitting: Family Medicine

## 2015-09-24 MED ORDER — ALLOPURINOL 300 MG PO TABS: 300.0000 mg | ORAL_TABLET | Freq: Every day | ORAL | Status: DC

## 2015-09-24 NOTE — Telephone Encounter (Signed)
Pt contacted office for refill request on the following medications: allopurinol (ZYLOPRIM) 300 MG tablet to [facility]. Pt is scheduled for a 4 month F/U on 10/06/15 but will run out of medication 10/04/15. Please advise. Thanks TNP

## 2015-09-24 NOTE — Telephone Encounter (Signed)
Done-aa 

## 2015-10-06 ENCOUNTER — Encounter: Payer: Self-pay | Admitting: Family Medicine

## 2015-10-06 ENCOUNTER — Ambulatory Visit (INDEPENDENT_AMBULATORY_CARE_PROVIDER_SITE_OTHER): Payer: 59 | Admitting: Family Medicine

## 2015-10-06 VITALS — BP 132/64 | HR 80 | Temp 98.0°F | Resp 14 | Wt 273.0 lb

## 2015-10-06 DIAGNOSIS — M109 Gout, unspecified: Secondary | ICD-10-CM

## 2015-10-06 DIAGNOSIS — E118 Type 2 diabetes mellitus with unspecified complications: Secondary | ICD-10-CM

## 2015-10-06 DIAGNOSIS — Z1211 Encounter for screening for malignant neoplasm of colon: Secondary | ICD-10-CM | POA: Diagnosis not present

## 2015-10-06 DIAGNOSIS — H6982 Other specified disorders of Eustachian tube, left ear: Secondary | ICD-10-CM

## 2015-10-06 DIAGNOSIS — J40 Bronchitis, not specified as acute or chronic: Secondary | ICD-10-CM | POA: Diagnosis not present

## 2015-10-06 DIAGNOSIS — E049 Nontoxic goiter, unspecified: Secondary | ICD-10-CM

## 2015-10-06 DIAGNOSIS — E785 Hyperlipidemia, unspecified: Secondary | ICD-10-CM

## 2015-10-06 LAB — POCT GLYCOSYLATED HEMOGLOBIN (HGB A1C): Hemoglobin A1C: 7.8

## 2015-10-06 MED ORDER — LORATADINE 10 MG PO TABS: 10.0000 mg | ORAL_TABLET | Freq: Every day | ORAL | Status: DC

## 2015-10-06 NOTE — Progress Notes (Signed)
Patient ID: Jeffery West, male   DOB: 05/13/1956, 60 y.o.   MRN: [id number]    Subjective:  HPI  Diabetes Mellitus Type II, Follow-up:   Lab Results  Component Value Date   HGBA1C 7.7 06/04/2015   HGBA1C 7.2 01/07/2015    Last seen for diabetes 4 months ago.  Management since then includes none. He reports good compliance with treatment. He is not having side effects.  Current symptoms include none and have been unchanged. Home blood sugar records: not being checked.  Episodes of hypoglycemia? no   Current Insulin Regimen: n/a Most Recent Eye Exam: yearly Weight trend: stable Current exercise: very active at work, no regular exercise.  Pertinent Labs:    Component Value Date/Time   CHOL 188 06/04/2015 1648   TRIG 293* 06/04/2015 1648   HDL 34* 06/04/2015 1648   LDLCALC 95 06/04/2015 1648   CREATININE 0.78 06/04/2015 1648    Wt Readings from Last 3 Encounters:  10/06/15 273 lb (123.832 kg)  06/20/15 282 lb (127.914 kg)  06/04/15 276 lb (125.193 kg)    ------------------------------------------------------------------------   Lipid/Cholesterol, Follow-up:   Last seen for this4 months ago.  Management changes since that visit include none. . Last Lipid Panel:    Component Value Date/Time   CHOL 188 06/04/2015 1648   TRIG 293* 06/04/2015 1648   HDL 34* 06/04/2015 1648   CHOLHDL 5.5* 06/04/2015 1648   LDLCALC 95 06/04/2015 1648    Risk factors for vascular disease include diabetes mellitus and hypercholesterolemia  He reports good compliance with treatment. He is not having side effects.  Current symptoms include none and have been unchanged. Weight trend: stable  Wt Readings from Last 3 Encounters:  10/06/15 273 lb (123.832 kg)  06/20/15 282 lb (127.914 kg)  06/04/15 276 lb (125.193 kg)    -------------------------------------------------------------------  Pt reports that since his last OV he has had 2 leg injuries that he has been seen  elsewhere for one. He hurt his left ankle at a customers home and had X-rays done. He is still having soreness and mild swelling in his ankle. He injries his left leg at work as well and is still sore. He had a bruise on his upper thigh area, but has cleared. He declined seeing a doctor for this at the time when his employer offered.    Prior to Admission medications   Medication Sig Start Date End Date Taking? Authorizing Provider  albuterol (PROAIR HFA) 108 (90 BASE) MCG/ACT inhaler Inhale 1 puff into the lungs every 6 (six) hours as needed for wheezing or shortness of breath. 06/04/15   Richard Maceo Pro., MD  allopurinol (ZYLOPRIM) 300 MG tablet Take 1 tablet (300 mg total) by mouth daily. 09/24/15   Richard Maceo Pro., MD  cyclobenzaprine (FLEXERIL) 10 MG tablet Take 1 tablet (10 mg total) by mouth 3 (three) times daily as needed for muscle spasms. 03/10/15   Richard Maceo Pro., MD  fluticasone (FLONASE) 50 MCG/ACT nasal spray Place 2 sprays into both nostrils daily. 06/04/15   Richard Maceo Pro., MD  loratadine ([facility] ALLERGY RELIEF) 10 MG tablet Take 1 tablet (10 mg total) by mouth daily. 06/04/15   Richard Maceo Pro., MD  metFORMIN (GLUCOPHAGE) 500 MG tablet Take 1 tablet (500 mg total) by mouth 2 (two) times daily with a meal. Patient not taking: Reported on 06/04/2015 01/07/15   Carmon Ginsberg, PA  Naproxen Sodium 220 MG CAPS Take by mouth.  Historical Provider, MD  predniSONE (DELTASONE) 10 MG tablet Taper daily as follows: 6 pills, 5, 4, 3, 2, 1 06/20/15   Carmon Ginsberg, PA  sildenafil (REVATIO) 20 MG tablet Take 2-3 tablets daily as needed for E.D. 01/07/15   Carmon Ginsberg, PA  simvastatin (ZOCOR) 20 MG tablet TAKE 1 TABLET (20 MG TOTAL) BY MOUTH DAILY. 09/19/15   Richard Maceo Pro., MD    Patient Active Problem List   Diagnosis Date Noted  . AB (asthmatic bronchitis) 02/26/2015  . Adaptation reaction 02/26/2015  . Allergic rhinitis 02/26/2015  . Diabetes ([place])  02/26/2015  . ED (erectile dysfunction) of organic origin 02/26/2015  . Gout 02/26/2015  . HLD (hyperlipidemia) 02/26/2015  . Adiposity 02/26/2015  . Arthritis, degenerative 02/26/2015  . Avitaminosis D 02/26/2015  . Erectile dysfunction associated with type 2 diabetes mellitus ([place]) 01/07/2015    Past Medical History  Diagnosis Date  . Diabetes mellitus without complication ([place])   . Vitamin D deficiency   . Hyperlipidemia   . Asthma   . Gout   . Osteoarthritis   . Erectile dysfunction   . Obesity     Social History   Social History  . Marital Status: Married    Spouse Name: N/A  . Number of Children: N/A  . Years of Education: N/A   Occupational History  . Not on file.   Social History Main Topics  . Smoking status: Former Smoker    Types: Cigars  . Smokeless tobacco: Never Used     Comment: every once in a while, very rare.  . Alcohol Use: 0.0 oz/week    0 Standard drinks or equivalent per week     Comment: occassionally  . Drug Use: No  . Sexual Activity: Not on file   Other Topics Concern  . Not on file   Social History Narrative    Allergies  Allergen Reactions  . Penicillins Hives    Review of Systems  Constitutional: Negative.   HENT: Negative.        Left ear fullness  Eyes: Negative.   Respiratory: Negative.   Cardiovascular: Negative.   Gastrointestinal: Negative.   Genitourinary: Negative.   Musculoskeletal: Positive for myalgias and joint pain.  Skin: Negative.   Neurological: Positive for dizziness.  Endo/Heme/Allergies: Negative.   Psychiatric/Behavioral: Negative.      There is no immunization history on file for this patient. Objective:  BP 132/64 mmHg  Pulse 80  Temp(Src) 98 F (36.7 C) (Oral)  Resp 14  Wt 273 lb (123.832 kg)  Physical Exam  Constitutional: He is oriented to person, place, and time and well-developed, well-nourished, and in no distress.  HENT:  Head: Normocephalic and atraumatic.  Right Ear:  External ear normal.  Nose: Nose normal.  Mouth/Throat: Oropharynx is clear and moist.  Left TM more dull than the right.   Eyes: Conjunctivae and EOM are normal. Pupils are equal, round, and reactive to light.  Neck: Normal range of motion. Neck supple. Thyromegaly (mild diffused on left side) present.  Cardiovascular: Normal rate, regular rhythm and intact distal pulses.   Murmur (low pitched 3/6 systolic murmur) heard. Pulmonary/Chest: Effort normal and breath sounds normal.  Abdominal: Soft.  Musculoskeletal: Normal range of motion.  Neurological: He is alert and oriented to person, place, and time. He has normal reflexes. Gait normal. GCS score is 15.  Skin: Skin is warm and dry.  Psychiatric: Mood, memory, affect and judgment normal.    Lab Results  Component  Value Date   WBC 7.3 06/04/2015   HCT 45.2 06/04/2015   PLT 171 06/04/2015   GLUCOSE 190* 06/04/2015   CHOL 188 06/04/2015   TRIG 293* 06/04/2015   HDL 34* 06/04/2015   LDLCALC 95 06/04/2015   TSH 2.770 06/04/2015   HGBA1C 7.7 06/04/2015    CMP     Component Value Date/Time   NA 140 06/04/2015 1648   K 4.6 06/04/2015 1648   CL 97 06/04/2015 1648   CO2 27 06/04/2015 1648   GLUCOSE 190* 06/04/2015 1648   BUN 11 06/04/2015 1648   CREATININE 0.78 06/04/2015 1648   CALCIUM 10.0 06/04/2015 1648   PROT 7.2 06/04/2015 1648   ALBUMIN 4.6 06/04/2015 1648   AST 44* 06/04/2015 1648   ALT 48* 06/04/2015 1648   ALKPHOS 85 06/04/2015 1648   BILITOT 0.6 06/04/2015 1648   GFRNONAA 99 06/04/2015 1648   GFRAA 115 06/04/2015 1648    Assessment and Plan :  1. Type 2 diabetes mellitus with complication, without long-term current use of insulin (HCC)  - POCT HgB A1C- 7.8 today. Discussed diet and exercise.   2. Gout without tophus, unspecified cause, unspecified chronicity, unspecified site   3. HLD (hyperlipidemia)   4. Eustachian tube dysfunction, left  - loratadine ([facility] ALLERGY RELIEF) 10 MG tablet; Take 1  tablet (10 mg total) by mouth daily.  Dispense: 90 tablet; Refill: 3  5. Thyroid enlargement Left side of thyroid. Questionable goiter. - Ambulatory referral to Endocrinology 6.Morbid obesity Diet  and exercise discussed today.  Patient was seen and examined by Dr. Miguel Aschoff, and noted scribed by Webb Laws, [place] [facility] 10/06/2015 8:47 AM

## 2015-12-03 ENCOUNTER — Ambulatory Visit (INDEPENDENT_AMBULATORY_CARE_PROVIDER_SITE_OTHER): Payer: Managed Care, Other (non HMO) | Admitting: Family Medicine

## 2015-12-03 ENCOUNTER — Encounter: Payer: Self-pay | Admitting: Family Medicine

## 2015-12-03 VITALS — BP 128/78 | HR 84 | Temp 97.7°F | Resp 16 | Wt 268.0 lb

## 2015-12-03 DIAGNOSIS — I10 Essential (primary) hypertension: Secondary | ICD-10-CM

## 2015-12-03 DIAGNOSIS — E118 Type 2 diabetes mellitus with unspecified complications: Secondary | ICD-10-CM | POA: Diagnosis not present

## 2015-12-03 LAB — POCT UA - MICROALBUMIN: MICROALBUMIN (UR) POC: 20 mg/L

## 2015-12-03 MED ORDER — LOSARTAN POTASSIUM 50 MG PO TABS: 50.0000 mg | ORAL_TABLET | Freq: Every day | ORAL | Status: DC
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
## 2015-12-04 ENCOUNTER — Encounter: Payer: Self-pay | Admitting: Family Medicine

## 2015-12-04 ENCOUNTER — Telehealth: Payer: Self-pay | Admitting: Family Medicine

## 2015-12-04 NOTE — Telephone Encounter (Signed)
Sop potassium.

## 2015-12-04 NOTE — Telephone Encounter (Signed)
Is he ok to take potassium supplement with losartan?-aa

## 2015-12-04 NOTE — Telephone Encounter (Signed)
LMTCB

## 2015-12-04 NOTE — Telephone Encounter (Signed)
Pt informed and voiced understanding of results. 

## 2015-12-04 NOTE — Telephone Encounter (Signed)
Pt states he was taking a potassium supplement and noticed the medication he was perscribe yesterday advised him not to take potassium with the medication.  He want to know if it is okay to take the supplement or not with the new medication given yesterday.

## 2016-01-01 ENCOUNTER — Ambulatory Visit: Payer: Managed Care, Other (non HMO) | Admitting: Family Medicine

## 2016-02-09 ENCOUNTER — Encounter: Payer: Self-pay | Admitting: Family Medicine

## 2016-02-09 ENCOUNTER — Ambulatory Visit (INDEPENDENT_AMBULATORY_CARE_PROVIDER_SITE_OTHER): Payer: Managed Care, Other (non HMO) | Admitting: Family Medicine

## 2016-02-09 VITALS — BP 128/70 | HR 84 | Temp 97.4°F | Resp 16 | Wt 274.0 lb

## 2016-02-09 DIAGNOSIS — I10 Essential (primary) hypertension: Secondary | ICD-10-CM | POA: Diagnosis not present

## 2016-02-09 DIAGNOSIS — E119 Type 2 diabetes mellitus without complications: Secondary | ICD-10-CM | POA: Diagnosis not present

## 2016-02-09 DIAGNOSIS — J309 Allergic rhinitis, unspecified: Secondary | ICD-10-CM | POA: Diagnosis not present

## 2016-02-09 LAB — POCT GLYCOSYLATED HEMOGLOBIN (HGB A1C): Hemoglobin A1C: 7.9

## 2016-02-09 MED ORDER — METFORMIN HCL 500 MG PO TABS: 500.0000 mg | ORAL_TABLET | Freq: Two times a day (BID) | ORAL | 12 refills | Status: DC

## 2016-02-09 NOTE — Progress Notes (Signed)
Subjective:  HPI   Hypertension, follow-up:  BP Readings from Last 3 Encounters:  02/09/16 128/70  12/03/15 128/78  10/06/15 132/64    He was last seen for hypertension 4 months ago.  BP at that visit was 128/78. Management since that visit includes started Losartan. He reports good compliance with treatment. He is not having side effects.  He is not exercising. He is not adherent to low salt diet.   Outside blood pressures are not being checked. He is experiencing none.  Patient denies chest pain, chest pressure/discomfort, claudication, dyspnea, exertional chest pressure/discomfort, fatigue, irregular heart beat, lower extremity edema, near-syncope, orthopnea, palpitations, paroxysmal nocturnal dyspnea and syncope.   Cardiovascular risk factors include diabetes mellitus, male gender, obesity (BMI >= 30 kg/m2) and smoking/ tobacco exposure.   Wt Readings from Last 3 Encounters:  02/09/16 274 lb (124.3 kg)  12/03/15 268 lb (121.6 kg)  10/06/15 273 lb (123.8 kg)   ------------------------------------------------------------------------    Diabetes Mellitus Type II, Follow-up:   Lab Results  Component Value Date   HGBA1C 7.8 10/06/2015   HGBA1C 7.7 06/04/2015   HGBA1C 7.2 01/07/2015    Last seen for diabetes 4 months ago.  Management since then includes none. However pt is still not taking Metformin "because I see a lot of stuff on TV about it" He reports good compliance with treatment. He is not having side effects.    Current Insulin Regimen: n/a  Pertinent Labs:    Component Value Date/Time   CHOL 188 06/04/2015 1648   TRIG 293 (H) 06/04/2015 1648   HDL 34 (L) 06/04/2015 1648   LDLCALC 95 06/04/2015 1648   CREATININE 0.78 06/04/2015 1648    Wt Readings from Last 3 Encounters:  02/09/16 274 lb (124.3 kg)  12/03/15 268 lb (121.6 kg)  10/06/15 273 lb (123.8 kg)    ------------------------------------------------------------------------    Prior  to Admission medications   Medication Sig Start Date End Date Taking? Authorizing Provider  albuterol (PROAIR HFA) 108 (90 BASE) MCG/ACT inhaler Inhale 1 puff into the lungs every 6 (six) hours as needed for wheezing or shortness of breath. 06/04/15  Yes Tavaras Goody Maceo Pro., MD  allopurinol (ZYLOPRIM) 300 MG tablet Take 1 tablet (300 mg total) by mouth daily. 09/24/15  Yes Jerene Yeager Maceo Pro., MD  losartan (COZAAR) 50 MG tablet Take 1 tablet (50 mg total) by mouth daily. 12/03/15  Yes Nam Vossler Maceo Pro., MD  meloxicam (MOBIC) 15 MG tablet Take by mouth. Reported on 12/03/2015 09/30/15 09/29/16 Yes Historical Provider, MD  Naproxen Sodium 220 MG CAPS Take by mouth. Reported on 12/03/2015   Yes Historical Provider, MD  sildenafil (REVATIO) 20 MG tablet Take 2-3 tablets daily as needed for E.D. 01/07/15  Yes Carmon Ginsberg, PA  simvastatin (ZOCOR) 20 MG tablet TAKE 1 TABLET (20 MG TOTAL) BY MOUTH DAILY. 09/19/15  Yes Meshelle Holness Maceo Pro., MD  fluticasone [facility]) 50 MCG/ACT nasal spray Place 2 sprays into both nostrils daily. Patient not taking: Reported on 10/06/2015 06/04/15   Jerrol Banana., MD  loratadine (CLARITIN) 10 MG tablet Take by mouth.    Historical Provider, MD  metFORMIN (GLUCOPHAGE) 500 MG tablet Take 1 tablet (500 mg total) by mouth 2 (two) times daily with a meal. Patient not taking: Reported on 06/04/2015 01/07/15   Carmon Ginsberg, PA    Patient Active Problem List   Diagnosis Date Noted  . AB (asthmatic bronchitis) 02/26/2015  . Adaptation reaction 02/26/2015  .  Allergic rhinitis 02/26/2015  . Diabetes ([place]) 02/26/2015  . ED (erectile dysfunction) of organic origin 02/26/2015  . Gout 02/26/2015  . HLD (hyperlipidemia) 02/26/2015  . Adiposity 02/26/2015  . Arthritis, degenerative 02/26/2015  . Avitaminosis D 02/26/2015  . Airway hyperreactivity 02/26/2015  . Controlled type 2 diabetes mellitus without complication ([place]) 99991111  . Erectile dysfunction associated  with type 2 diabetes mellitus ([place]) 01/07/2015    Past Medical History:  Diagnosis Date  . Asthma   . Diabetes mellitus without complication ([place])   . Erectile dysfunction   . Gout   . Hyperlipidemia   . Obesity   . Osteoarthritis   . Vitamin D deficiency     Social History   Social History  . Marital status: Married    Spouse name: N/A  . Number of children: N/A  . Years of education: N/A   Occupational History  . Not on file.   Social History Main Topics  . Smoking status: Former Smoker    Types: Cigars  . Smokeless tobacco: Never Used     Comment: every once in a while, very rare.  . Alcohol use 0.0 oz/week     Comment: occassionally  . Drug use: No  . Sexual activity: Not on file   Other Topics Concern  . Not on file   Social History Narrative  . No narrative on file    Allergies  Allergen Reactions  . Penicillins Hives    Review of Systems  Constitutional: Negative.   HENT: Negative.   Eyes: Negative.   Respiratory: Negative.   Cardiovascular: Negative.   Gastrointestinal: Negative.   Genitourinary: Negative.   Musculoskeletal: Negative.   Skin: Negative.   Neurological: Negative for headaches.  Endo/Heme/Allergies: Negative.   Psychiatric/Behavioral: Negative.      There is no immunization history on file for this patient. Objective:  BP 128/70 (BP Location: Left Arm, Patient Position: Sitting, Cuff Size: Large)   Pulse 84   Temp 97.4 F (36.3 C) (Oral)   Resp 16   Wt 274 lb (124.3 kg)   BMI 38.22 kg/m   Physical Exam  Constitutional: He is oriented to person, place, and time and well-developed, well-nourished, and in no distress.  HENT:  Head: Normocephalic and atraumatic.  Right Ear: External ear normal.  Left Ear: External ear normal.  Nose: Nose normal.  Eyes: Conjunctivae and EOM are normal. Pupils are equal, round, and reactive to light.  Neck: Normal range of motion. Neck supple.  Cardiovascular:  Murmur (3/6 systolic  murmur heard best at the left upper sternal boarder) heard. Pulmonary/Chest: Effort normal and breath sounds normal.  Musculoskeletal: Normal range of motion.  Neurological: He is alert and oriented to person, place, and time. He has normal reflexes. Gait normal. GCS score is 15.  Skin: Skin is warm and dry.  Psychiatric: Mood, memory, affect and judgment normal.    Lab Results  Component Value Date   WBC 7.3 06/04/2015   HCT 45.2 06/04/2015   PLT 171 06/04/2015   GLUCOSE 190 (H) 06/04/2015   CHOL 188 06/04/2015   TRIG 293 (H) 06/04/2015   HDL 34 (L) 06/04/2015   LDLCALC 95 06/04/2015   TSH 2.770 06/04/2015   HGBA1C 7.8 10/06/2015    CMP     Component Value Date/Time   NA 140 06/04/2015 1648   K 4.6 06/04/2015 1648   CL 97 06/04/2015 1648   CO2 27 06/04/2015 1648   GLUCOSE 190 (H) 06/04/2015 1648  BUN 11 06/04/2015 1648   CREATININE 0.78 06/04/2015 1648   CALCIUM 10.0 06/04/2015 1648   PROT 7.2 06/04/2015 1648   ALBUMIN 4.6 06/04/2015 1648   AST 44 (H) 06/04/2015 1648   ALT 48 (H) 06/04/2015 1648   ALKPHOS 85 06/04/2015 1648   BILITOT 0.6 06/04/2015 1648   GFRNONAA 99 06/04/2015 1648   GFRAA 115 06/04/2015 1648    Assessment and Plan :  1. Controlled type 2 diabetes mellitus without complication, without long-term current use of insulin (HCC)  - POCT HgB A1C 7.9 today. Restart Metformin.   - metFORMIN (GLUCOPHAGE) 500 MG tablet; Take 1 tablet (500 mg total) by mouth 2 (two) times daily with a meal.  Dispense: 60 tablet; Refill: 12  2. Essential hypertension Improved. Continue Losartan  3. Allergic rhinitis, unspecified allergic rhinitis type   4. Type 2 diabetes mellitus without complication, without long-term current use of insulin (HCC)  - metFORMIN (GLUCOPHAGE) 500 MG tablet; Take 1 tablet (500 mg total) by mouth 2 (two) times daily with a meal.  Dispense: 60 tablet; Refill: 12 5. Morbid obesity Weight loss through dietary changes discussed. Patient  says that he can work on a low carb diet.  Patient was seen and examined by Dr. Miguel Aschoff, and noted scribed by Webb Laws, CMA I have done the exam and reviewed the above chart and it is accurate to the best of my knowledge.  Miguel Aschoff MD [facility] 02/09/2016 8:33 AM

## 2016-02-09 NOTE — Progress Notes (Signed)
       Patient: Jeffery West Male    DOB: May 18, 1956   60 y.o.   MRN: [id number] Visit Date: 02/09/2016  Today's Provider: [name], MD   No chief complaint on file.  Subjective:    HPI  Diabetes Mellitus Type II, Follow-up:   Lab Results  Component Value Date   HGBA1C 7.8 10/06/2015   HGBA1C 7.7 06/04/2015   HGBA1C 7.2 01/07/2015    Last seen for diabetes 4 months ago.  Management since then includes Patient stopped his Invokamet due to commercials on TV He reports fair compliance with treatment. He is having side effects.--none Current symptoms include none and have been stable. Home blood sugar records: fasting range: 150s  Episodes of hypoglycemia? no   Current Insulin Regimen:  Most Recent Eye Exam:  Weight trend: stable Prior visit with dietician: no Current diet: well balanced Current exercise: none  Pertinent Labs:    Component Value Date/Time   CHOL 188 06/04/2015 1648   TRIG 293 (H) 06/04/2015 1648   HDL 34 (L) 06/04/2015 1648   LDLCALC 95 06/04/2015 1648   CREATININE 0.78 06/04/2015 1648    Wt Readings from Last 3 Encounters:  12/03/15 268 lb (121.6 kg)  10/06/15 273 lb (123.8 kg)  06/20/15 282 lb (127.9 kg)    ------------------------------------------------------------------------      Allergies  Allergen Reactions  . Penicillins Hives   No outpatient prescriptions have been marked as taking for the 02/09/16 encounter (Appointment) with Jerrol Banana., MD.    Review of Systems  Social History  Substance Use Topics  . Smoking status: Former Smoker    Types: Cigars  . Smokeless tobacco: Never Used     Comment: every once in a while, very rare.  . Alcohol use 0.0 oz/week     Comment: occassionally   Objective:   There were no vitals taken for this visit.  Physical Exam      Assessment & Plan:           [name], MD  [facility]

## 2016-03-05 ENCOUNTER — Other Ambulatory Visit: Payer: Self-pay | Admitting: Family Medicine

## 2016-03-05 NOTE — Telephone Encounter (Signed)
Dr. Gilbert's pt.   Thanks,   -Miesha Bachmann  

## 2016-06-28 ENCOUNTER — Ambulatory Visit: Payer: Managed Care, Other (non HMO) | Admitting: Family Medicine

## 2016-07-26 ENCOUNTER — Ambulatory Visit (INDEPENDENT_AMBULATORY_CARE_PROVIDER_SITE_OTHER): Payer: Managed Care, Other (non HMO) | Admitting: Family Medicine

## 2016-07-26 VITALS — BP 160/78 | HR 76 | Temp 97.5°F | Resp 16 | Wt 265.0 lb

## 2016-07-26 DIAGNOSIS — E785 Hyperlipidemia, unspecified: Secondary | ICD-10-CM | POA: Diagnosis not present

## 2016-07-26 DIAGNOSIS — Z125 Encounter for screening for malignant neoplasm of prostate: Secondary | ICD-10-CM | POA: Diagnosis not present

## 2016-07-26 DIAGNOSIS — E119 Type 2 diabetes mellitus without complications: Secondary | ICD-10-CM | POA: Diagnosis not present

## 2016-07-26 DIAGNOSIS — E1159 Type 2 diabetes mellitus with other circulatory complications: Secondary | ICD-10-CM | POA: Diagnosis not present

## 2016-07-26 DIAGNOSIS — R252 Cramp and spasm: Secondary | ICD-10-CM | POA: Diagnosis not present

## 2016-07-26 DIAGNOSIS — I10 Essential (primary) hypertension: Secondary | ICD-10-CM | POA: Diagnosis not present

## 2016-07-26 NOTE — Progress Notes (Signed)
Jeffery West  MRN: [id number] DOB: [birth year]  Subjective:  HPI   1. Controlled type 2 diabetes mellitus without complication, without long-term current use of insulin [facility]) The patient is a 61 year old male who presents for follow up of his diabetes.  He was last seen on 02/09/16.  His A1C at that time was 7.9.  He does not check his glucose at home.  It is of note that his weight is down 9 pounds.  2. Hyperlipidemia, unspecified hyperlipidemia type The patient is due for his labs   3. Hypertension associated with diabetes [facility]) The patient is also here for follow up of his hypertension.  He does not check his pressure outside of the office.   Patient Active Problem List   Diagnosis Date Noted  . AB (asthmatic bronchitis) 02/26/2015  . Adaptation reaction 02/26/2015  . Allergic rhinitis 02/26/2015  . Diabetes ([place]) 02/26/2015  . ED (erectile dysfunction) of organic origin 02/26/2015  . Gout 02/26/2015  . HLD (hyperlipidemia) 02/26/2015  . Adiposity 02/26/2015  . Arthritis, degenerative 02/26/2015  . Avitaminosis D 02/26/2015  . Airway hyperreactivity 02/26/2015  . Controlled type 2 diabetes mellitus without complication ([place]) 99991111  . Erectile dysfunction associated with type 2 diabetes mellitus ([place]) 01/07/2015    Past Medical History:  Diagnosis Date  . Asthma   . Diabetes mellitus without complication ([place])   . Erectile dysfunction   . Gout   . Hyperlipidemia   . Obesity   . Osteoarthritis   . Vitamin D deficiency     Social History   Social History  . Marital status: Married    Spouse name: N/A  . Number of children: N/A  . Years of education: N/A   Occupational History  . Not on file.   Social History Main Topics  . Smoking status: Former Smoker    Types: Cigars  . Smokeless tobacco: Never Used     Comment: every once in a while, very rare.  . Alcohol use 0.0 oz/week     Comment: occassionally  . Drug use: No  . Sexual activity: Not on  file   Other Topics Concern  . Not on file   Social History Narrative  . No narrative on file    Outpatient Encounter Prescriptions as of 07/26/2016  Medication Sig Note  . albuterol (PROAIR HFA) 108 (90 BASE) MCG/ACT inhaler Inhale 1 puff into the lungs every 6 (six) hours as needed for wheezing or shortness of breath.   . allopurinol (ZYLOPRIM) 300 MG tablet TAKE 1 TABLET (300 MG TOTAL) BY MOUTH DAILY.   . fluticasone (FLONASE) 50 MCG/ACT nasal spray Place 2 sprays into both nostrils daily.   Marland Kitchen loratadine (CLARITIN) 10 MG tablet Take by mouth. 02/09/2016: Received from: [place]: Take 10 mg by mouth once daily.  Marland Kitchen losartan (COZAAR) 50 MG tablet Take 1 tablet (50 mg total) by mouth daily.   . meloxicam (MOBIC) 15 MG tablet Take by mouth. Reported on 12/03/2015 10/06/2015: Received from: [place]  . metFORMIN (GLUCOPHAGE) 500 MG tablet Take 1 tablet (500 mg total) by mouth 2 (two) times daily with a meal.   . sildenafil (REVATIO) 20 MG tablet Take 2-3 tablets daily as needed for E.D.   . simvastatin (ZOCOR) 20 MG tablet TAKE 1 TABLET (20 MG TOTAL) BY MOUTH DAILY.   . [DISCONTINUED] Naproxen Sodium 220 MG CAPS Take by mouth. Reported on 12/03/2015 02/26/2015: Received from: [facility]  No facility-administered encounter medications on file as of 07/26/2016.     Allergies  Allergen Reactions  . Penicillins Hives    Review of Systems  Constitutional: Positive for weight loss (intentional). Negative for fever and malaise/fatigue.  Respiratory: Negative for cough, shortness of breath and wheezing.   Cardiovascular: Negative for chest pain, palpitations and leg swelling.  Gastrointestinal: Negative.   Musculoskeletal: Positive for myalgias (leg cramps).  Neurological: Negative for weakness.  Psychiatric/Behavioral: Negative.     Objective:  BP (!) 160/78 (BP Location: Right Arm, Patient Position: Sitting, Cuff Size: Normal)   Pulse  76   Temp 97.5 F (36.4 C) (Oral)   Resp 16   Wt 265 lb (120.2 kg)   BMI 36.96 kg/m   Physical Exam  Constitutional: He is oriented to person, place, and time and well-developed, well-nourished, and in no distress.  HENT:  Head: Normocephalic and atraumatic.  Right Ear: External ear normal.  Left Ear: External ear normal.  Nose: Nose normal.  Eyes: Conjunctivae are normal. Pupils are equal, round, and reactive to light. No scleral icterus.  Neck: Normal range of motion. No thyromegaly present.  Cardiovascular: Normal rate and regular rhythm.   Murmur (II/VI systolic murmur) heard. Pulmonary/Chest: Effort normal and breath sounds normal.  Abdominal: Soft.  Musculoskeletal: He exhibits no edema.  Neurological: He is alert and oriented to person, place, and time. Gait normal. GCS score is 15.  Skin: Skin is warm.  Psychiatric: Mood, memory, affect and judgment normal.   Diabetic Foot Exam - Simple   Simple Foot Form Diabetic Foot exam was performed with the following findings:  Yes 07/26/2016 10:56 AM  Visual Inspection No deformities, no ulcerations, no other skin breakdown bilaterally:  Yes Sensation Testing Intact to touch and monofilament testing bilaterally:  Yes Pulse Check Posterior Tibialis and Dorsalis pulse intact bilaterally:  Yes Comments      Assessment and Plan :   1. Controlled type 2 diabetes mellitus without complication, without long-term current use of insulin (HCC)  - Hemoglobin A1c  2. Hyperlipidemia, unspecified hyperlipidemia type Will discontinue today to see if having side effects. - Lipid Panel With LDL/HDL Ratio  3. Hypertension associated with diabetes ([place])  - CBC with Differential/Platelet - Comprehensive metabolic panel - TSH  4. Prostate cancer screening  - PSA  5. Muscle cramps Will discontinue Simvastatin to see if this is a side effect.  Will start Magnesium on his next visit if necessary. 6.Morbid obesity  HPI, Exam and  A&P Transcribed under the direction and in the presence of Miguel Aschoff, Brooke Bonito., MD. Electronically Signed: Althea Charon, RMA I have done the exam and reviewed the chart and it is accurate to the best of my knowledge. Development worker, community has been used and  any errors in dictation or transcription are unintentional. Miguel Aschoff M.D. [facility]

## 2016-07-27 ENCOUNTER — Telehealth: Payer: Self-pay

## 2016-07-27 LAB — LIPID PANEL WITH LDL/HDL RATIO
Cholesterol, Total: 197 mg/dL (ref 100–199)
HDL: 37 mg/dL — AB (ref >39)
LDL CALC: 89 mg/dL (ref 0–99)
LDl/HDL Ratio: 2.4 ratio units (ref 0.0–3.6)
TRIGLYCERIDES: 353 mg/dL — AB (ref 0–149)
VLDL Cholesterol Cal: 71 mg/dL — ABNORMAL HIGH (ref 5–40)

## 2016-07-27 LAB — COMPREHENSIVE METABOLIC PANEL
ALBUMIN: 4.5 g/dL (ref 3.5–5.5)
ALT: 33 IU/L (ref 0–44)
AST: 30 IU/L (ref 0–40)
Albumin/Globulin Ratio: 1.7 (ref 1.2–2.2)
Alkaline Phosphatase: 60 IU/L (ref 39–117)
BUN/Creatinine Ratio: 15 (ref 9–20)
BUN: 11 mg/dL (ref 6–24)
Bilirubin Total: 0.6 mg/dL (ref 0.0–1.2)
CALCIUM: 9.6 mg/dL (ref 8.7–10.2)
CHLORIDE: 98 mmol/L (ref 96–106)
CO2: 25 mmol/L (ref 18–29)
CREATININE: 0.74 mg/dL — AB (ref 0.76–1.27)
GFR calc non Af Amer: 101 mL/min/{1.73_m2} (ref >59)
GFR, EST AFRICAN AMERICAN: 117 mL/min/{1.73_m2} (ref >59)
Globulin, Total: 2.7 g/dL (ref 1.5–4.5)
Glucose: 127 mg/dL — ABNORMAL HIGH (ref 65–99)
Potassium: 4.2 mmol/L (ref 3.5–5.2)
Sodium: 140 mmol/L (ref 134–144)
TOTAL PROTEIN: 7.2 g/dL (ref 6.0–8.5)

## 2016-07-27 LAB — TSH: TSH: 2.67 u[IU]/mL (ref 0.450–4.500)

## 2016-07-27 LAB — CBC WITH DIFFERENTIAL/PLATELET
BASOS ABS: 0 10*3/uL (ref 0.0–0.2)
Basos: 1 %
EOS (ABSOLUTE): 0.2 10*3/uL (ref 0.0–0.4)
Eos: 2 %
HEMOGLOBIN: 14.8 g/dL (ref 13.0–17.7)
Hematocrit: 45.7 % (ref 37.5–51.0)
IMMATURE GRANS (ABS): 0.1 10*3/uL (ref 0.0–0.1)
IMMATURE GRANULOCYTES: 1 %
LYMPHS: 36 %
Lymphocytes Absolute: 3.1 10*3/uL (ref 0.7–3.1)
MCH: 30 pg (ref 26.6–33.0)
MCHC: 32.4 g/dL (ref 31.5–35.7)
MCV: 93 fL (ref 79–97)
MONOCYTES: 5 %
Monocytes Absolute: 0.5 10*3/uL (ref 0.1–0.9)
NEUTROS PCT: 55 %
Neutrophils Absolute: 4.8 10*3/uL (ref 1.4–7.0)
PLATELETS: 169 10*3/uL (ref 150–379)
RBC: 4.94 x10E6/uL (ref 4.14–5.80)
RDW: 14.1 % (ref 12.3–15.4)
WBC: 8.5 10*3/uL (ref 3.4–10.8)

## 2016-07-27 LAB — HEMOGLOBIN A1C
Est. average glucose Bld gHb Est-mCnc: 171 mg/dL
Hgb A1c MFr Bld: 7.6 % — ABNORMAL HIGH (ref 4.8–5.6)

## 2016-07-27 LAB — PSA: Prostate Specific Ag, Serum: 2.3 ng/mL (ref 0.0–4.0)

## 2016-07-27 NOTE — Telephone Encounter (Signed)
Lmtcb. Emily Drozdowski, CMA  

## 2016-07-27 NOTE — Telephone Encounter (Signed)
-----   Message from Jerrol Banana., MD sent at 07/27/2016  1:48 PM EST ----- Labs okay. Diet and exercise as discussed.

## 2016-07-28 NOTE — Telephone Encounter (Signed)
Patient advised.

## 2016-08-26 ENCOUNTER — Ambulatory Visit (INDEPENDENT_AMBULATORY_CARE_PROVIDER_SITE_OTHER): Payer: 59 | Admitting: Family Medicine

## 2016-08-26 VITALS — BP 134/72 | HR 68 | Temp 98.0°F | Resp 16 | Wt 266.0 lb

## 2016-08-26 DIAGNOSIS — M7541 Impingement syndrome of right shoulder: Secondary | ICD-10-CM | POA: Diagnosis not present

## 2016-08-26 DIAGNOSIS — R011 Cardiac murmur, unspecified: Secondary | ICD-10-CM | POA: Diagnosis not present

## 2016-08-26 DIAGNOSIS — M791 Myalgia, unspecified site: Secondary | ICD-10-CM

## 2016-08-26 DIAGNOSIS — E1159 Type 2 diabetes mellitus with other circulatory complications: Secondary | ICD-10-CM

## 2016-08-26 DIAGNOSIS — E785 Hyperlipidemia, unspecified: Secondary | ICD-10-CM

## 2016-08-26 DIAGNOSIS — I152 Hypertension secondary to endocrine disorders: Secondary | ICD-10-CM

## 2016-08-26 DIAGNOSIS — I10 Essential (primary) hypertension: Secondary | ICD-10-CM

## 2016-08-26 MED ORDER — ROSUVASTATIN CALCIUM 5 MG PO TABS: 5.0000 mg | ORAL_TABLET | Freq: Every day | ORAL | 12 refills | Status: DC

## 2016-08-26 MED ORDER — COENZYME Q-10 200 MG PO CAPS: 1.0000 | ORAL_CAPSULE | Freq: Every day | ORAL | 12 refills | Status: DC

## 2016-08-26 NOTE — Progress Notes (Signed)
Jeffery West  MRN: [id number] DOB: 11-26-1955  Subjective:  HPI   The patient is a 61 year old male who presents for follow up on his hypertension and myalgias.  Hypertension-The pateint was last seen on 07/26/16 and his blood pressure at that time was 160/78.  He is on Losartan and reports good compliance.  Myalgias-The patient was complaining of muscle pain on his last visit and it was decided to stop his Simvastatin to see if that helped.  He reports that the cramps are somewhat better but he is still having problems.  During the last visit it was noted that he may need to start Magnesium if they were not better today.  Shoulder pain-The patient started to ask for an evaluation of his right shoulder pain.  He states he has had problems that date back at least 10 years.  He was seen by Dr Precious Reel who told him he had arthritis in the shoulder.  He is questioning if there is something more wrong with it.  He takes Meloxicam but can not really say if it helps.   He does not have pain when not using the arm and complains mostly that certain movements are difficult for him.  He appears to have decrease range of motion with that arm.  When asked if he wanted to try something different of be referred he then said he needed to wait due to finances.   Patient Active Problem List   Diagnosis Date Noted  . AB (asthmatic bronchitis) 02/26/2015  . Adaptation reaction 02/26/2015  . Allergic rhinitis 02/26/2015  . Diabetes ([place]) 02/26/2015  . ED (erectile dysfunction) of organic origin 02/26/2015  . Gout 02/26/2015  . HLD (hyperlipidemia) 02/26/2015  . Adiposity 02/26/2015  . Arthritis, degenerative 02/26/2015  . Avitaminosis D 02/26/2015  . Airway hyperreactivity 02/26/2015  . Controlled type 2 diabetes mellitus without complication ([place]) 40/98/1191  . Erectile dysfunction associated with type 2 diabetes mellitus ([place]) 01/07/2015    Past Medical History:  Diagnosis Date  . Asthma   .  Diabetes mellitus without complication ([place])   . Erectile dysfunction   . Gout   . Hyperlipidemia   . Obesity   . Osteoarthritis   . Vitamin D deficiency     Social History   Social History  . Marital status: Married    Spouse name: N/A  . Number of children: N/A  . Years of education: N/A   Occupational History  . Not on file.   Social History Main Topics  . Smoking status: Former Smoker    Types: Cigars  . Smokeless tobacco: Never Used     Comment: every once in a while, very rare.  . Alcohol use 0.0 oz/week     Comment: occassionally  . Drug use: No  . Sexual activity: Not on file   Other Topics Concern  . Not on file   Social History Narrative  . No narrative on file    Outpatient Encounter Prescriptions as of 08/26/2016  Medication Sig Note  . albuterol (PROAIR HFA) 108 (90 BASE) MCG/ACT inhaler Inhale 1 puff into the lungs every 6 (six) hours as needed for wheezing or shortness of breath.   . allopurinol (ZYLOPRIM) 300 MG tablet TAKE 1 TABLET (300 MG TOTAL) BY MOUTH DAILY.   . fluticasone (FLONASE) 50 MCG/ACT nasal spray Place 2 sprays into both nostrils daily.   Marland Kitchen loratadine (CLARITIN) 10 MG tablet Take by mouth. 02/09/2016: Received from: [facility]  System Received Sig: Take 10 mg by mouth once daily.  Marland Kitchen losartan (COZAAR) 50 MG tablet Take 1 tablet (50 mg total) by mouth daily.   . meloxicam (MOBIC) 15 MG tablet Take by mouth. Reported on 12/03/2015 10/06/2015: Received from: [place]  . metFORMIN (GLUCOPHAGE) 500 MG tablet Take 1 tablet (500 mg total) by mouth 2 (two) times daily with a meal.   . sildenafil (REVATIO) 20 MG tablet Take 2-3 tablets daily as needed for E.D.    No facility-administered encounter medications on file as of 08/26/2016.     Allergies  Allergen Reactions  . Penicillins Hives    Review of Systems  Constitutional: Negative.   Eyes: Negative.   Respiratory: Negative.   Cardiovascular: Negative.   Gastrointestinal:  Negative.   Musculoskeletal: Positive for joint pain and myalgias.  Skin: Negative.   Neurological: Negative.   Endo/Heme/Allergies: Negative.   Psychiatric/Behavioral: Negative.       PHQ9 PATIENT REFUSED TO DO PHQ9 TODAY.  WILL ATTEMPT AGAIN AT A LATER DATE.  Objective:  BP 134/72 (BP Location: Right Arm, Patient Position: Sitting, Cuff Size: Normal)   Pulse 68   Temp 98 F (36.7 C) (Oral)   Resp 16   Wt 266 lb (120.7 kg)   BMI 37.10 kg/m   Physical Exam  Constitutional: He is oriented to person, place, and time and well-developed, well-nourished, and in no distress.  HENT:  Head: Normocephalic and atraumatic.  Right Ear: External ear normal.  Eyes: Pupils are equal, round, and reactive to light.  Neck: Normal range of motion.  Cardiovascular: Normal rate, regular rhythm and normal heart sounds.   Murmur: Low 2/6 M RUSB. Pulmonary/Chest: Effort normal and breath sounds normal.  Abdominal: Soft.  Musculoskeletal:  Decreased ROM of the right shoulder  Neurological: He is alert and oriented to person, place, and time.  Skin: Skin is warm and dry.  Psychiatric: Affect normal.    Assessment and Plan :   1. Hyperlipidemia, unspecified hyperlipidemia type Patient did not have significant improvement of the myalgias off of the Simvastatin.  Due to risk factor with diabetes we will put the patient on Crestor low dose.  Check lipids on his next visit.  - rosuvastatin (CRESTOR) 5 MG tablet; Take 1 tablet (5 mg total) by mouth daily.  Dispense: 30 tablet; Refill: 12  2. Myalgia Minimal imprpovement off of the Simvastatin.  Will start him on Co Q 10 today and put him on Crestor low dose as it may have less side effects. Follow up with CK on his next visit.  - Coenzyme Q-10 200 MG CAPS; Take 1 capsule (200 mg total) by mouth daily at 2 PM.  Dispense: 30 capsule; Refill: 12  3. Impingement syndrome of right shoulder Offered referral and patient declined.  Recommend continue  anti-inflammatory medication and will make referral when patient is ready.  4. Murmur, cardiac Patient states he never went and had this evaluated.  Will refer to cardiology today.  - Ambulatory referral to Cardiology  5. Hypertension associated with diabetes ([place])  Stable.  Improved reading today, continue current medication. 6.Morbid obesity with comorbididy of DM/HTN HPI, Exam and A&P Transcribed under the direction and in the presence of [name]., MD. Electronically Signed: Althea Charon, RMA

## 2016-09-01 ENCOUNTER — Encounter: Payer: Self-pay | Admitting: *Deleted

## 2016-09-15 ENCOUNTER — Other Ambulatory Visit: Payer: Self-pay | Admitting: Physician Assistant

## 2016-09-15 NOTE — Telephone Encounter (Signed)
Please review-aa 

## 2016-09-17 ENCOUNTER — Other Ambulatory Visit: Payer: Self-pay | Admitting: Physician Assistant

## 2016-10-11 LAB — HM DIABETES EYE EXAM

## 2016-11-10 ENCOUNTER — Ambulatory Visit: Payer: 59 | Admitting: Family Medicine

## 2016-12-19 ENCOUNTER — Other Ambulatory Visit: Payer: Self-pay | Admitting: Family Medicine

## 2016-12-19 DIAGNOSIS — I10 Essential (primary) hypertension: Secondary | ICD-10-CM

## 2017-02-14 ENCOUNTER — Ambulatory Visit (INDEPENDENT_AMBULATORY_CARE_PROVIDER_SITE_OTHER): Payer: 59 | Admitting: Family Medicine

## 2017-02-14 ENCOUNTER — Encounter: Payer: Self-pay | Admitting: Family Medicine

## 2017-02-14 VITALS — BP 132/68 | HR 88 | Temp 98.2°F | Resp 16 | Wt 275.0 lb

## 2017-02-14 DIAGNOSIS — R252 Cramp and spasm: Secondary | ICD-10-CM

## 2017-02-14 MED ORDER — MAGNESIUM OXIDE 400 MG PO TABS: 400.0000 mg | ORAL_TABLET | Freq: Two times a day (BID) | ORAL | 11 refills | Status: DC

## 2017-02-14 NOTE — Patient Instructions (Addendum)
Restart statin. Try magnesium Oxide that was sent to the pharmacy.

## 2017-02-14 NOTE — Progress Notes (Signed)
Patient: Jeffery West Male    DOB: 05-15-1956   61 y.o.   MRN: [id number] Visit Date: 02/14/2017  Today's Provider: [name], MD   Chief Complaint  Patient presents with  . Muscle Pain    muscle cramps   Subjective:    HPI Pt is here today for muscle cramps in his inner thighs and his calf muscle. He reports that he had been seen for this this in the past and his statin was changed, the cramps got worse. He has since stopped his statin and the cramps are still occurring. He stopped it around July 20 th. He gets them in the middle of the night. He drinks about a half gallon of water per day and does not sweat as much as he used to. He would like to have labs done to see what the cramps could be coming from.     Allergies  Allergen Reactions  . Penicillins Hives     Current Outpatient Prescriptions:  .  albuterol (PROAIR HFA) 108 (90 BASE) MCG/ACT inhaler, Inhale 1 puff into the lungs every 6 (six) hours as needed for wheezing or shortness of breath., Disp: 1 Inhaler, Rfl: 3 .  allopurinol (ZYLOPRIM) 300 MG tablet, TAKE 1 TABLET (300 MG TOTAL) BY MOUTH DAILY., Disp: 90 tablet, Rfl: 1 .  Coenzyme Q-10 200 MG CAPS, Take 1 capsule (200 mg total) by mouth daily at 2 PM., Disp: 30 capsule, Rfl: 12 .  losartan (COZAAR) 50 MG tablet, TAKE 1 TABLET (50 MG TOTAL) BY MOUTH DAILY., Disp: 30 tablet, Rfl: 6 .  metFORMIN (GLUCOPHAGE) 500 MG tablet, Take 1 tablet (500 mg total) by mouth 2 (two) times daily with a meal., Disp: 60 tablet, Rfl: 12 .  fluticasone (FLONASE) 50 MCG/ACT nasal spray, Place 2 sprays into both nostrils daily. (Patient not taking: Reported on 02/14/2017), Disp: 16 g, Rfl: 12 .  loratadine (CLARITIN) 10 MG tablet, Take by mouth., Disp: , Rfl:  .  rosuvastatin (CRESTOR) 5 MG tablet, Take 1 tablet (5 mg total) by mouth daily. (Patient not taking: Reported on 02/14/2017), Disp: 30 tablet, Rfl: 12 .  sildenafil (REVATIO) 20 MG tablet, Take 2-3 tablets daily as needed  for E.D., Disp: 20 tablet, Rfl: 5  Review of Systems  Constitutional: Negative.   HENT: Negative.   Eyes: Negative.   Respiratory: Negative.   Cardiovascular: Negative.   Gastrointestinal: Negative.   Endocrine: Negative.   Genitourinary: Negative.   Musculoskeletal: Positive for myalgias.  Skin: Negative.   Allergic/Immunologic: Negative.   Neurological: Negative.   Hematological: Negative.   Psychiatric/Behavioral: Negative.     Social History  Substance Use Topics  . Smoking status: Former Smoker    Types: Cigars  . Smokeless tobacco: Never Used     Comment: every once in a while, very rare.  . Alcohol use 0.0 oz/week     Comment: occassionally   Objective:   BP 132/68 (BP Location: Left Arm, Patient Position: Sitting, Cuff Size: Large)   Pulse 88   Temp 98.2 F (36.8 C) (Oral)   Resp 16   Wt 275 lb (124.7 kg)   BMI 38.35 kg/m  Vitals:   02/14/17 1116  BP: 132/68  Pulse: 88  Resp: 16  Temp: 98.2 F (36.8 C)  TempSrc: Oral  Weight: 275 lb (124.7 kg)     Physical Exam  Constitutional: He is oriented to person, place, and time. He appears well-developed and well-nourished.  Eyes: Pupils are equal, round, and reactive to light. Conjunctivae and EOM are normal.  Neck: Normal range of motion. Neck supple.  Cardiovascular: Normal rate, regular rhythm, normal heart sounds and intact distal pulses.   Pulmonary/Chest: Effort normal and breath sounds normal.  Musculoskeletal: Normal range of motion.  Neurological: He is alert and oriented to person, place, and time. He has normal reflexes.  Skin: Skin is warm and dry.  Psychiatric: He has a normal mood and affect. His behavior is normal. Judgment and thought content normal.        Assessment & Plan:     1. Muscle cramps  - Comprehensive metabolic panel - CK - Magnesium - magnesium oxide (MAG-OX) 400 MG tablet; Take 1 tablet (400 mg total) by mouth 2 (two) times daily.  Dispense: 60 tablet; Refill:  11 2.Obesity 3.TIIDM 4.HLD     HPI, Exam, and A&P Transcribed under the direction and in the presence of Tauni Sanks L. Cranford Mon, MD  Electronically Signed: Katina Dung, CMA I have done the exam and reviewed the above chart and it is accurate to the best of my knowledge. Development worker, community has been used in this note in any air is in the dictation or transcription are unintentional.  [name], MD  [place]

## 2017-02-15 LAB — COMPREHENSIVE METABOLIC PANEL
A/G RATIO: 1.5 (ref 1.2–2.2)
ALT: 58 IU/L — ABNORMAL HIGH (ref 0–44)
AST: 49 IU/L — AB (ref 0–40)
Albumin: 4.3 g/dL (ref 3.5–5.5)
Alkaline Phosphatase: 80 IU/L (ref 39–117)
BUN/Creatinine Ratio: 17 (ref 9–20)
BUN: 13 mg/dL (ref 6–24)
Bilirubin Total: 0.4 mg/dL (ref 0.0–1.2)
CALCIUM: 9.9 mg/dL (ref 8.7–10.2)
CO2: 25 mmol/L (ref 20–29)
Chloride: 97 mmol/L (ref 96–106)
Creatinine, Ser: 0.78 mg/dL (ref 0.76–1.27)
GFR calc Af Amer: 114 mL/min/{1.73_m2} (ref >59)
GFR, EST NON AFRICAN AMERICAN: 99 mL/min/{1.73_m2} (ref >59)
Globulin, Total: 2.8 g/dL (ref 1.5–4.5)
Glucose: 231 mg/dL — ABNORMAL HIGH (ref 65–99)
POTASSIUM: 4.9 mmol/L (ref 3.5–5.2)
Sodium: 137 mmol/L (ref 134–144)
Total Protein: 7.1 g/dL (ref 6.0–8.5)

## 2017-02-15 LAB — MAGNESIUM: MAGNESIUM: 1.8 mg/dL (ref 1.6–2.3)

## 2017-02-15 LAB — CK: Total CK: 1041 U/L (ref 24–204)

## 2017-02-17 ENCOUNTER — Other Ambulatory Visit: Payer: Self-pay | Admitting: Family Medicine

## 2017-02-17 DIAGNOSIS — E119 Type 2 diabetes mellitus without complications: Secondary | ICD-10-CM

## 2017-02-23 ENCOUNTER — Other Ambulatory Visit: Payer: Self-pay | Admitting: Emergency Medicine

## 2017-02-23 DIAGNOSIS — I10 Essential (primary) hypertension: Secondary | ICD-10-CM

## 2017-02-23 DIAGNOSIS — M791 Myalgia, unspecified site: Secondary | ICD-10-CM

## 2017-02-23 DIAGNOSIS — E1159 Type 2 diabetes mellitus with other circulatory complications: Secondary | ICD-10-CM

## 2017-02-23 DIAGNOSIS — E119 Type 2 diabetes mellitus without complications: Secondary | ICD-10-CM

## 2017-02-24 LAB — CK: Total CK: 261 U/L — ABNORMAL HIGH (ref 44–196)

## 2017-02-25 LAB — RENAL FUNCTION PANEL
Albumin: 4.4 g/dL (ref 3.6–5.1)
BUN: 17 mg/dL (ref 7–25)
CALCIUM: 10.2 mg/dL (ref 8.6–10.3)
CO2: 29 mmol/L (ref 20–32)
Chloride: 100 mmol/L (ref 98–110)
Creat: 0.82 mg/dL (ref 0.70–1.33)
Glucose, Bld: 239 mg/dL — ABNORMAL HIGH (ref 65–99)
POTASSIUM: 4.7 mmol/L (ref 3.5–5.3)
Phosphorus: 3.9 mg/dL (ref 2.5–4.5)
SODIUM: 136 mmol/L (ref 135–146)

## 2017-03-16 ENCOUNTER — Ambulatory Visit (INDEPENDENT_AMBULATORY_CARE_PROVIDER_SITE_OTHER): Payer: 59 | Admitting: Physician Assistant

## 2017-03-16 ENCOUNTER — Encounter: Payer: Self-pay | Admitting: Physician Assistant

## 2017-03-16 VITALS — BP 130/70 | HR 71 | Temp 98.3°F | Wt 274.2 lb

## 2017-03-16 DIAGNOSIS — R05 Cough: Secondary | ICD-10-CM | POA: Diagnosis not present

## 2017-03-16 DIAGNOSIS — J0110 Acute frontal sinusitis, unspecified: Secondary | ICD-10-CM | POA: Diagnosis not present

## 2017-03-16 DIAGNOSIS — R059 Cough, unspecified: Secondary | ICD-10-CM

## 2017-03-16 MED ORDER — DOXYCYCLINE HYCLATE 100 MG PO TABS: 100.0000 mg | ORAL_TABLET | Freq: Two times a day (BID) | ORAL | 0 refills | Status: AC

## 2017-03-16 MED ORDER — BENZONATATE 100 MG PO CAPS: 100.0000 mg | ORAL_CAPSULE | Freq: Three times a day (TID) | ORAL | 0 refills | Status: AC | PRN

## 2017-03-16 NOTE — Patient Instructions (Signed)

## 2017-03-16 NOTE — Progress Notes (Signed)
Patient: Jeffery West Male    DOB: 03-Jan-1956   61 y.o.   MRN: [id number] Visit Date: 03/17/2017  Today's Provider: Trinna Post, PA-C   Chief Complaint  Patient presents with  . URI   Subjective:      Jeffery West is a 61 y/o man with history of Type II DM presenting with 10 days of sinus congestion/pain. Has tried alkaseltzer cough and congestion without relief. Now with purulent rhinorrhea.   URI   This is a new problem. Episode onset: 03/05/17. The problem has been gradually improving. There has been no fever. Associated symptoms include congestion and coughing. Sinus pain: sinus pressure. Treatments tried: OTC cold and cough medication. The treatment provided mild relief.     Previous Medications   ALBUTEROL (PROAIR HFA) 108 (90 BASE) MCG/ACT INHALER    Inhale 1 puff into the lungs every 6 (six) hours as needed for wheezing or shortness of breath.   ALLOPURINOL (ZYLOPRIM) 300 MG TABLET    TAKE 1 TABLET (300 MG TOTAL) BY MOUTH DAILY.   COENZYME Q-10 200 MG CAPS    Take 1 capsule (200 mg total) by mouth daily at 2 PM.   FLUTICASONE (FLONASE) 50 MCG/ACT NASAL SPRAY    Place 2 sprays into both nostrils daily.   LORATADINE (CLARITIN) 10 MG TABLET    Take by mouth.   LOSARTAN (COZAAR) 50 MG TABLET    TAKE 1 TABLET (50 MG TOTAL) BY MOUTH DAILY.   MAGNESIUM OXIDE (MAG-OX) 400 MG TABLET    Take 1 tablet (400 mg total) by mouth 2 (two) times daily.   MELOXICAM (MOBIC) 15 MG TABLET       METFORMIN (GLUCOPHAGE) 500 MG TABLET    TAKE 1 TABLET (500 MG TOTAL) BY MOUTH 2 (TWO) TIMES DAILY WITH A MEAL.   ROSUVASTATIN (CRESTOR) 5 MG TABLET    Take 1 tablet (5 mg total) by mouth daily.   SILDENAFIL (REVATIO) 20 MG TABLET    Take 2-3 tablets daily as needed for E.D.    Review of Systems  Constitutional: Negative.   HENT: Positive for congestion and sinus pressure. Sinus pain: sinus pressure.   Respiratory: Positive for cough.   Cardiovascular: Negative.     Social History  Substance  Use Topics  . Smoking status: Former Smoker    Types: Cigars  . Smokeless tobacco: Never Used     Comment: every once in a while, very rare.  . Alcohol use 0.0 oz/week     Comment: occassionally   Objective:   BP 130/70 (BP Location: Right Arm, Patient Position: Sitting, Cuff Size: Normal)   Pulse 71   Temp 98.3 F (36.8 C) (Oral)   Wt 274 lb 3.2 oz (124.4 kg)   SpO2 97%   BMI 38.24 kg/m   Physical Exam  Constitutional: He is oriented to person, place, and time. He appears well-developed and well-nourished.  HENT:  Right Ear: Tympanic membrane and external ear normal.  Left Ear: Tympanic membrane and external ear normal.  Nose: Right sinus exhibits frontal sinus tenderness. Left sinus exhibits frontal sinus tenderness.  Mouth/Throat: Oropharynx is clear and moist. No oropharyngeal exudate.  Cardiovascular: Normal rate and regular rhythm.   Pulmonary/Chest: Effort normal and breath sounds normal. No respiratory distress. He has no wheezes.  Lymphadenopathy:    He has cervical adenopathy.  Neurological: He is alert and oriented to person, place, and time.  Skin: Skin is warm and dry.  Psychiatric: He has  a normal mood and affect. His behavior is normal.        Assessment & Plan:     1. Acute non-recurrent frontal sinusitis  - doxycycline (VIBRA-TABS) 100 MG tablet; Take 1 tablet (100 mg total) by mouth 2 (two) times daily.  Dispense: 20 tablet; Refill: 0  2. Cough  - benzonatate (TESSALON PERLES) 100 MG capsule; Take 1 capsule (100 mg total) by mouth 3 (three) times daily as needed for cough.  Dispense: 21 capsule; Refill: 0  The entirety of the information documented in the History of Present Illness, Review of Systems and Physical Exam were personally obtained by me. Portions of this information were initially documented by [facility], CMA and reviewed by me for thoroughness and accuracy.    Follow up: Return if symptoms worsen or fail to improve.

## 2017-03-23 ENCOUNTER — Encounter: Payer: Self-pay | Admitting: Family Medicine

## 2017-03-23 ENCOUNTER — Ambulatory Visit (INDEPENDENT_AMBULATORY_CARE_PROVIDER_SITE_OTHER): Payer: 59 | Admitting: Family Medicine

## 2017-03-23 VITALS — BP 114/60 | HR 84 | Temp 97.7°F | Resp 16 | Wt 273.0 lb

## 2017-03-23 DIAGNOSIS — Z1211 Encounter for screening for malignant neoplasm of colon: Secondary | ICD-10-CM

## 2017-03-23 DIAGNOSIS — R252 Cramp and spasm: Secondary | ICD-10-CM

## 2017-03-23 NOTE — Progress Notes (Signed)
Patient: Jeffery West Male    DOB: Aug 14, 1955   61 y.o.   MRN: [id number] Visit Date: 03/23/2017  Today's Provider: [name], MD   Chief Complaint  Patient presents with  . Follow-up   Subjective:    HPI Pt was seen for muscle cramps, started Mag ox checked met C and CK. CK was elevated showing muscle inflammation. Stopped Crestor and RTC 2-4 weeks. Labs rechecked on 02/24/17 and had improved. Pt is here today to follow up on this. He reports that he is feeling better, and having less cramps. He is due for colonoscopy (will place ordered today), Tdap, flu, pneumovax and shingles vaccines. He would like to wait on the pneumonia and Tdap until he comes back for his normal follow up in November. He has had a cold last week and was not treat with antibiotics, he is getting better.        Allergies  Allergen Reactions  . Penicillins Hives     Current Outpatient Prescriptions:  .  allopurinol (ZYLOPRIM) 300 MG tablet, TAKE 1 TABLET (300 MG TOTAL) BY MOUTH DAILY., Disp: 90 tablet, Rfl: 1 .  benzonatate (TESSALON PERLES) 100 MG capsule, Take 1 capsule (100 mg total) by mouth 3 (three) times daily as needed for cough., Disp: 21 capsule, Rfl: 0 .  losartan (COZAAR) 50 MG tablet, TAKE 1 TABLET (50 MG TOTAL) BY MOUTH DAILY., Disp: 30 tablet, Rfl: 6 .  magnesium oxide (MAG-OX) 400 MG tablet, Take 1 tablet (400 mg total) by mouth 2 (two) times daily., Disp: 60 tablet, Rfl: 11 .  meloxicam (MOBIC) 15 MG tablet, , Disp: , Rfl:  .  metFORMIN (GLUCOPHAGE) 500 MG tablet, TAKE 1 TABLET (500 MG TOTAL) BY MOUTH 2 (TWO) TIMES DAILY WITH A MEAL., Disp: 60 tablet, Rfl: 11 .  sildenafil (REVATIO) 20 MG tablet, Take 2-3 tablets daily as needed for E.D., Disp: 20 tablet, Rfl: 5 .  albuterol (PROAIR HFA) 108 (90 BASE) MCG/ACT inhaler, Inhale 1 puff into the lungs every 6 (six) hours as needed for wheezing or shortness of breath. (Patient not taking: Reported on 03/16/2017), Disp: 1 Inhaler, Rfl:  3 .  Coenzyme Q-10 200 MG CAPS, Take 1 capsule (200 mg total) by mouth daily at 2 PM. (Patient not taking: Reported on 03/23/2017), Disp: 30 capsule, Rfl: 12 .  doxycycline (VIBRA-TABS) 100 MG tablet, Take 1 tablet (100 mg total) by mouth 2 (two) times daily. (Patient not taking: Reported on 03/23/2017), Disp: 20 tablet, Rfl: 0 .  fluticasone (FLONASE) 50 MCG/ACT nasal spray, Place 2 sprays into both nostrils daily. (Patient not taking: Reported on 02/14/2017), Disp: 16 g, Rfl: 12 .  loratadine (CLARITIN) 10 MG tablet, Take by mouth., Disp: , Rfl:  .  rosuvastatin (CRESTOR) 5 MG tablet, Take 1 tablet (5 mg total) by mouth daily. (Patient not taking: Reported on 03/16/2017), Disp: 30 tablet, Rfl: 12  Review of Systems  Constitutional: Negative.   HENT: Negative.   Eyes: Negative.   Respiratory: Negative.   Cardiovascular: Negative.   Gastrointestinal: Negative.   Endocrine: Negative.   Genitourinary: Negative.   Musculoskeletal: Positive for myalgias.  Skin: Negative.   Allergic/Immunologic: Negative.   Neurological: Negative.   Hematological: Negative.   Psychiatric/Behavioral: Negative.     Social History  Substance Use Topics  . Smoking status: Former Smoker    Types: Cigars  . Smokeless tobacco: Never Used     Comment: every once in a while, very  rare.  . Alcohol use 0.0 oz/week     Comment: occassionally   Objective:   BP 114/60 (BP Location: Left Arm, Patient Position: Sitting, Cuff Size: Large)   Pulse 84   Temp 97.7 F (36.5 C) (Oral)   Resp 16   Wt 273 lb (123.8 kg)   BMI 38.08 kg/m  Vitals:   03/23/17 1452  BP: 114/60  Pulse: 84  Resp: 16  Temp: 97.7 F (36.5 C)  TempSrc: Oral  Weight: 273 lb (123.8 kg)     Physical Exam  Constitutional: He is oriented to person, place, and time. He appears well-developed and well-nourished.  HENT:  Head: Normocephalic and atraumatic.  Right Ear: External ear normal.  Left Ear: External ear normal.  Nose: Nose normal.   Eyes: Conjunctivae are normal. No scleral icterus.  Neck: No thyromegaly present.  Cardiovascular: Normal rate, regular rhythm and normal heart sounds.   Pulmonary/Chest: Effort normal and breath sounds normal.  Abdominal: Soft.  Neurological: He is alert and oriented to person, place, and time.  Skin: Skin is warm and dry.  Psychiatric: He has a normal mood and affect. His behavior is normal. Judgment and thought content normal.        Assessment & Plan:     1. Muscle cramps Improved.   2. Colon cancer screening  - Ambulatory referral to Gastroenterology  No vaccines given today as pt has had a URI. Will get vaccines at next OV.  3.Obesity 4.TIIDM     HPI, Exam, and A&P Transcribed under the direction and in the presence of Heiress Williamson L. Cranford Mon, MD  Electronically Signed: Katina Dung, CMA I have done the exam and reviewed the chart and it is accurate to the best of my knowledge. Development worker, community has been used and  any errors in dictation or transcription are unintentional. Miguel Aschoff M.D. [place], MD  [facility]

## 2017-03-30 ENCOUNTER — Other Ambulatory Visit: Payer: Self-pay | Admitting: Family Medicine

## 2017-03-30 NOTE — Telephone Encounter (Signed)
Pt contacted office for refill request on the following medications:  1. benzonatate (TESSALON PERLES) 100 MG capsule 2. doxycycline (VIBRA-TABS) 100 MG tablet  [facility]  Pt stated he saw Adriana on 03/16/17 and was started on the medications for sinus infection. Pt stated that his symptoms had started getting better but now they have returned and pt is going out of town Friday. Pt would like another round of the medication sent to [facility]. Please advise. Thanks TNP

## 2017-03-31 MED ORDER — DOXYCYCLINE HYCLATE 100 MG PO TABS: 100.0000 mg | ORAL_TABLET | Freq: Two times a day (BID) | ORAL | 0 refills | Status: DC

## 2017-03-31 MED ORDER — BENZONATATE 100 MG PO CAPS: 100.0000 mg | ORAL_CAPSULE | Freq: Two times a day (BID) | ORAL | 0 refills | Status: DC | PRN

## 2017-03-31 NOTE — Telephone Encounter (Signed)
Medication was sent into the pharmacy. L/M stating it was sent in.

## 2017-03-31 NOTE — Telephone Encounter (Signed)
ok 

## 2017-04-14 ENCOUNTER — Encounter: Payer: Self-pay | Admitting: Family Medicine

## 2017-05-04 ENCOUNTER — Encounter: Payer: Self-pay | Admitting: Family Medicine

## 2017-05-04 ENCOUNTER — Ambulatory Visit (INDEPENDENT_AMBULATORY_CARE_PROVIDER_SITE_OTHER): Payer: 59 | Admitting: Family Medicine

## 2017-05-04 VITALS — BP 150/82 | HR 90 | Temp 98.4°F | Resp 16 | Wt 279.2 lb

## 2017-05-04 DIAGNOSIS — K1120 Sialoadenitis, unspecified: Secondary | ICD-10-CM

## 2017-05-04 MED ORDER — CLINDAMYCIN HCL 300 MG PO CAPS: 300.0000 mg | ORAL_CAPSULE | Freq: Three times a day (TID) | ORAL | 0 refills | Status: DC

## 2017-05-04 NOTE — Patient Instructions (Signed)
Discussed use of sour candy to stimulate salivary gland secretion. Let us know if not improving over the next 24-48 hours.

## 2017-05-04 NOTE — Progress Notes (Signed)
Subjective:     Patient ID: Jeffery West, male   DOB: [birth year], 61 y.o.   MRN: [id number]  HPI  Chief Complaint  Patient presents with  . Facial Swelling    Patient comes in office this morning after noticing this morning upon awakening that the left side of his face was swollen. Patient reports sorness when pressing again left ear that radiates down to his jawline.   Reports regular dental visits and completing course of doxycycline last month for a sinus infection. No fever, chills or pain with chewing.   Review of Systems     Objective:   Physical Exam  Constitutional: He appears well-developed and well-nourished. No distress.  HENT:  No cervical adenopathy or tenderness on percussion of his teeth. Fluctuant swelling below posterior left lower jaw line. Prominence of left Stensen's duct but no purulence expressed or stone felt on palpation. No TMJ tenderness or crepitus. TM's intact without inflammation       Assessment:    1. Parotiditis: clindamycin 300 mg.tid #21    Plan:    Encouraged use of sialogogues. To call if not improving in the next 24-48 hours for ENT referral.

## 2017-05-23 ENCOUNTER — Ambulatory Visit (INDEPENDENT_AMBULATORY_CARE_PROVIDER_SITE_OTHER): Payer: 59 | Admitting: Family Medicine

## 2017-05-23 ENCOUNTER — Encounter: Payer: Self-pay | Admitting: Family Medicine

## 2017-05-23 VITALS — BP 124/64 | HR 82 | Temp 98.0°F | Resp 16 | Wt 279.0 lb

## 2017-05-23 DIAGNOSIS — Z1159 Encounter for screening for other viral diseases: Secondary | ICD-10-CM | POA: Diagnosis not present

## 2017-05-23 DIAGNOSIS — Z23 Encounter for immunization: Secondary | ICD-10-CM

## 2017-05-23 DIAGNOSIS — R011 Cardiac murmur, unspecified: Secondary | ICD-10-CM | POA: Diagnosis not present

## 2017-05-23 DIAGNOSIS — Z1211 Encounter for screening for malignant neoplasm of colon: Secondary | ICD-10-CM | POA: Diagnosis not present

## 2017-05-23 DIAGNOSIS — I10 Essential (primary) hypertension: Secondary | ICD-10-CM

## 2017-05-23 DIAGNOSIS — E785 Hyperlipidemia, unspecified: Secondary | ICD-10-CM | POA: Diagnosis not present

## 2017-05-23 DIAGNOSIS — E119 Type 2 diabetes mellitus without complications: Secondary | ICD-10-CM | POA: Diagnosis not present

## 2017-05-23 LAB — POCT GLYCOSYLATED HEMOGLOBIN (HGB A1C): Hemoglobin A1C: 9.9

## 2017-05-23 NOTE — Progress Notes (Signed)
Patient: Jeffery West Male    DOB: May 29, 1956   61 y.o.   MRN: [id number] Visit Date: 05/23/2017  Today's Provider: [name], MD   Chief Complaint  Patient presents with  . Diabetes   Subjective:    HPI  Diabetes Mellitus Type II, Follow-up:   Lab Results  Component Value Date   HGBA1C 9.9 05/23/2017   HGBA1C 7.6 (H) 07/26/2016   HGBA1C 7.9 02/09/2016    Last seen for diabetes 8 months ago.  Management since then includes none. He reports good compliance with treatment. He is not having side effects.  Home blood sugar records: not being checked  Episodes of hypoglycemia? no   Current Insulin Regimen: n/a  Pertinent Labs:    Component Value Date/Time   CHOL 197 07/26/2016 1507   TRIG 353 (H) 07/26/2016 1507   HDL 37 (L) 07/26/2016 1507   LDLCALC 89 07/26/2016 1507   CREATININE 0.82 02/24/2017 1543    Wt Readings from Last 3 Encounters:  05/23/17 279 lb (126.6 kg)  05/04/17 279 lb 3.2 oz (126.6 kg)  03/23/17 273 lb (123.8 kg)    ------------------------------------------------------------------------ Pt reports that he will also need labs today.      Allergies  Allergen Reactions  . Penicillins Hives     Current Outpatient Medications:  .  albuterol (PROAIR HFA) 108 (90 BASE) MCG/ACT inhaler, Inhale 1 puff into the lungs every 6 (six) hours as needed for wheezing or shortness of breath., Disp: 1 Inhaler, Rfl: 3 .  allopurinol (ZYLOPRIM) 300 MG tablet, TAKE 1 TABLET (300 MG TOTAL) BY MOUTH DAILY., Disp: 90 tablet, Rfl: 1 .  losartan (COZAAR) 50 MG tablet, TAKE 1 TABLET (50 MG TOTAL) BY MOUTH DAILY., Disp: 30 tablet, Rfl: 6 .  magnesium oxide (MAG-OX) 400 MG tablet, Take 1 tablet (400 mg total) by mouth 2 (two) times daily., Disp: 60 tablet, Rfl: 11 .  meloxicam (MOBIC) 15 MG tablet, , Disp: , Rfl:  .  metFORMIN (GLUCOPHAGE) 500 MG tablet, TAKE 1 TABLET (500 MG TOTAL) BY MOUTH 2 (TWO) TIMES DAILY WITH A MEAL., Disp: 60 tablet, Rfl:  11 .  clindamycin (CLEOCIN) 300 MG capsule, Take 1 capsule (300 mg total) 3 (three) times daily by mouth. (Patient not taking: Reported on 05/23/2017), Disp: 21 capsule, Rfl: 0 .  Coenzyme Q-10 200 MG CAPS, Take 1 capsule (200 mg total) by mouth daily at 2 PM. (Patient not taking: Reported on 05/23/2017), Disp: 30 capsule, Rfl: 12 .  fluticasone (FLONASE) 50 MCG/ACT nasal spray, Place 2 sprays into both nostrils daily. (Patient not taking: Reported on 05/23/2017), Disp: 16 g, Rfl: 12 .  loratadine (CLARITIN) 10 MG tablet, Take by mouth., Disp: , Rfl:  .  sildenafil (REVATIO) 20 MG tablet, Take 2-3 tablets daily as needed for E.D. (Patient not taking: Reported on 05/23/2017), Disp: 20 tablet, Rfl: 5  Review of Systems  Constitutional: Negative.   HENT: Negative.   Eyes: Negative.   Respiratory: Negative.   Cardiovascular: Negative.   Gastrointestinal: Negative.   Endocrine: Negative.   Genitourinary: Negative.   Musculoskeletal: Positive for arthralgias.  Skin: Negative.   Allergic/Immunologic: Negative.   Neurological: Negative.   Hematological: Negative.   Psychiatric/Behavioral: Negative.     Social History   Tobacco Use  . Smoking status: Former Smoker    Types: Cigars  . Smokeless tobacco: Never Used  . Tobacco comment: every once in a while, very rare.  Substance Use Topics  .  Alcohol use: Yes    Alcohol/week: 0.0 oz    Comment: occassionally   Objective:   BP 124/64 (BP Location: Left Arm, Patient Position: Sitting, Cuff Size: Large)   Pulse 82   Temp 98 F (36.7 C) (Oral)   Resp 16   Wt 279 lb (126.6 kg)   BMI 38.91 kg/m  Vitals:   05/23/17 1134  BP: 124/64  Pulse: 82  Resp: 16  Temp: 98 F (36.7 C)  TempSrc: Oral  Weight: 279 lb (126.6 kg)     Physical Exam  Constitutional: He is oriented to person, place, and time. He appears well-developed and well-nourished.  Eyes: Conjunctivae and EOM are normal. Pupils are equal, round, and reactive to light.   Neck: Neck supple.  Cardiovascular:  Murmur (2/6 systolic murmur across the entire chest) heard. Pulmonary/Chest: Effort normal and breath sounds normal.  Musculoskeletal: Normal range of motion.  Neurological: He is alert and oriented to person, place, and time. He has normal reflexes.  Skin: Skin is warm and dry.  Psychiatric: He has a normal mood and affect. His behavior is normal. Judgment and thought content normal.        Assessment & Plan:     1. Controlled type 2 diabetes mellitus without complication, without long-term current use of insulin ([place]) Pt states he had yeast balanitis from [place]. - POCT HgB A1C 9.9 today. Worse. Pt wants to work on habits first. Follow up 3 months. May need to start additional medications/injections.   2. Colon cancer screening  - Ambulatory referral to Gastroenterology  3. Murmur, cardiac  - Ambulatory referral to Cardiology  4. Need for hepatitis C screening test  - Hepatitis C Antibody  5. Hyperlipidemia, unspecified hyperlipidemia type  - Lipid panel - Comprehensive metabolic panel  6. Essential hypertension - TSH - CBC with Differential/Platelet 7.Morbid Obesity 8.Right Shoulder Pain Per Dr Sabra Heck.     HPI, Exam, and A&P Transcribed under the direction and in the presence of Aroura Vasudevan L. Cranford Mon, MD  Electronically Signed: Katina Dung, [place], MD  [facility]

## 2017-05-24 LAB — CBC WITH DIFFERENTIAL/PLATELET
BASOS ABS: 90 {cells}/uL (ref 0–200)
Basophils Relative: 1.4 %
EOS PCT: 1.9 %
Eosinophils Absolute: 122 cells/uL (ref 15–500)
HCT: 43 % (ref 38.5–50.0)
Hemoglobin: 14.9 g/dL (ref 13.2–17.1)
Lymphs Abs: 2259 cells/uL (ref 850–3900)
MCH: 31 pg (ref 27.0–33.0)
MCHC: 34.7 g/dL (ref 32.0–36.0)
MCV: 89.4 fL (ref 80.0–100.0)
MONOS PCT: 7.5 %
MPV: 12.1 fL (ref 7.5–12.5)
NEUTROS PCT: 53.9 %
Neutro Abs: 3450 cells/uL (ref 1500–7800)
Platelets: 136 10*3/uL — ABNORMAL LOW (ref 140–400)
RBC: 4.81 10*6/uL (ref 4.20–5.80)
RDW: 12.7 % (ref 11.0–15.0)
Total Lymphocyte: 35.3 %
WBC mixed population: 480 cells/uL (ref 200–950)
WBC: 6.4 10*3/uL (ref 3.8–10.8)

## 2017-05-24 LAB — COMPLETE METABOLIC PANEL WITH GFR
AG RATIO: 1.4 (calc) (ref 1.0–2.5)
ALBUMIN MSPROF: 4.2 g/dL (ref 3.6–5.1)
ALKALINE PHOSPHATASE (APISO): 75 U/L (ref 40–115)
ALT: 68 U/L — ABNORMAL HIGH (ref 9–46)
AST: 60 U/L — AB (ref 10–35)
BUN: 11 mg/dL (ref 7–25)
CO2: 28 mmol/L (ref 20–32)
CREATININE: 0.77 mg/dL (ref 0.70–1.25)
Calcium: 9.6 mg/dL (ref 8.6–10.3)
Chloride: 99 mmol/L (ref 98–110)
GFR, EST NON AFRICAN AMERICAN: 99 mL/min/{1.73_m2} (ref >=60)
GFR, Est African American: 114 mL/min/{1.73_m2} (ref >=60)
GLOBULIN: 2.9 g/dL (ref 1.9–3.7)
Glucose, Bld: 228 mg/dL — ABNORMAL HIGH (ref 65–99)
POTASSIUM: 4.3 mmol/L (ref 3.5–5.3)
SODIUM: 136 mmol/L (ref 135–146)
Total Bilirubin: 0.5 mg/dL (ref 0.2–1.2)
Total Protein: 7.1 g/dL (ref 6.1–8.1)

## 2017-05-24 LAB — LIPID PANEL
CHOL/HDL RATIO: 7.4 (calc) — AB (ref <5.0)
Cholesterol: 251 mg/dL — ABNORMAL HIGH (ref <200)
HDL: 34 mg/dL — AB (ref >40)
NON-HDL CHOLESTEROL (CALC): 217 mg/dL — AB (ref <130)
TRIGLYCERIDES: 489 mg/dL — AB (ref <150)

## 2017-05-24 LAB — TSH: TSH: 1.99 mIU/L (ref 0.40–4.50)

## 2017-05-24 LAB — HEPATITIS C ANTIBODY
Hepatitis C Ab: NONREACTIVE
SIGNAL TO CUT-OFF: 0.01 (ref <1.00)

## 2017-05-25 ENCOUNTER — Telehealth: Payer: Self-pay | Admitting: Family Medicine

## 2017-05-25 NOTE — Telephone Encounter (Signed)
Pt returned call about lab results. Please advise. Thanks TNP °

## 2017-06-07 DIAGNOSIS — M75120 Complete rotator cuff tear or rupture of unspecified shoulder, not specified as traumatic: Secondary | ICD-10-CM | POA: Insufficient documentation

## 2017-06-28 ENCOUNTER — Encounter: Payer: Self-pay | Admitting: Family Medicine

## 2017-06-28 ENCOUNTER — Ambulatory Visit (INDEPENDENT_AMBULATORY_CARE_PROVIDER_SITE_OTHER): Payer: 59 | Admitting: Family Medicine

## 2017-06-28 VITALS — BP 132/62 | HR 90 | Temp 97.8°F | Resp 14 | Ht 71.0 in | Wt 274.0 lb

## 2017-06-28 DIAGNOSIS — Z0001 Encounter for general adult medical examination with abnormal findings: Secondary | ICD-10-CM

## 2017-06-28 DIAGNOSIS — E785 Hyperlipidemia, unspecified: Secondary | ICD-10-CM | POA: Diagnosis not present

## 2017-06-28 DIAGNOSIS — E119 Type 2 diabetes mellitus without complications: Secondary | ICD-10-CM

## 2017-06-28 DIAGNOSIS — B354 Tinea corporis: Secondary | ICD-10-CM | POA: Diagnosis not present

## 2017-06-28 DIAGNOSIS — N529 Male erectile dysfunction, unspecified: Secondary | ICD-10-CM

## 2017-06-28 DIAGNOSIS — Z0000 Encounter for general adult medical examination without abnormal findings: Secondary | ICD-10-CM

## 2017-06-28 MED ORDER — CLOTRIMAZOLE-BETAMETHASONE 1-0.05 % EX CREA: 1.0000 "application " | TOPICAL_CREAM | Freq: Two times a day (BID) | CUTANEOUS | 0 refills | Status: DC

## 2017-06-28 MED ORDER — EZETIMIBE 10 MG PO TABS: 10.0000 mg | ORAL_TABLET | Freq: Every day | ORAL | 3 refills | Status: DC

## 2017-06-28 NOTE — Progress Notes (Signed)
Patient: Jeffery West, Male    DOB: October 14, 1955, 62 y.o.   MRN: [id number] Visit Date: 06/28/2017  Today's Provider: [name], MD   Chief Complaint  Patient presents with  . Annual Exam   Subjective:    Annual physical exam Jeffery West is a 62 y.o. male who presents today for health maintenance and complete physical. He feels well. He reports exercising, some. He has been trying to walk but cannot do much because of his shoulder. He reports he is sleeping fairly well, he is still getting some cramps at night but not as severe.  ----------------------------------------------------------------- Colonoscopy- never, referred pt at last OV. Pt wants to hold off right now due him possibly having to have shoulder surgery.    Review of Systems  Constitutional: Negative.   HENT: Positive for facial swelling and sinus pressure.   Eyes: Positive for itching.  Respiratory: Negative.   Cardiovascular: Negative.   Gastrointestinal: Negative.   Endocrine: Negative.   Genitourinary: Negative.   Musculoskeletal: Positive for arthralgias.  Skin: Negative.   Allergic/Immunologic: Negative.   Neurological: Negative.   Hematological: Negative.   Psychiatric/Behavioral: Negative.     Social History      He  reports that he has quit smoking. His smoking use included cigars. he has never used smokeless tobacco. He reports that he drinks alcohol. He reports that he does not use drugs.       Social History   Socioeconomic History  . Marital status: Married    Spouse name: None  . Number of children: None  . Years of education: None  . Highest education level: None  Social Needs  . Financial resource strain: None  . Food insecurity - worry: None  . Food insecurity - inability: None  . Transportation needs - medical: None  . Transportation needs - non-medical: None  Occupational History  . None  Tobacco Use  . Smoking status: Former Smoker    Types: Cigars  .  Smokeless tobacco: Never Used  . Tobacco comment: every once in a while, very rare.  Substance and Sexual Activity  . Alcohol use: Yes    Alcohol/week: 0.0 oz    Comment: occassionally  . Drug use: No  . Sexual activity: None  Other Topics Concern  . None  Social History Narrative  . None    Past Medical History:  Diagnosis Date  . Asthma   . Diabetes mellitus without complication ([place])   . Erectile dysfunction   . Gout   . Hyperlipidemia   . Obesity   . Osteoarthritis   . Vitamin D deficiency      Patient Active Problem List   Diagnosis Date Noted  . AB (asthmatic bronchitis) 02/26/2015  . Adaptation reaction 02/26/2015  . Allergic rhinitis 02/26/2015  . Diabetes ([place]) 02/26/2015  . ED (erectile dysfunction) of organic origin 02/26/2015  . Gout 02/26/2015  . HLD (hyperlipidemia) 02/26/2015  . Adiposity 02/26/2015  . Arthritis, degenerative 02/26/2015  . Avitaminosis D 02/26/2015  . Airway hyperreactivity 02/26/2015  . Controlled type 2 diabetes mellitus without complication ([place]) 77/41/2878  . Erectile dysfunction associated with type 2 diabetes mellitus ([place]) 01/07/2015    History reviewed. No pertinent surgical history.  Family History        Family Status  Relation Name Status  . Mother  Alive  . Father  Deceased        His family history includes Cancer in his father.  Allergies  Allergen Reactions  . Penicillins Hives     Current Outpatient Medications:  .  albuterol (PROAIR HFA) 108 (90 BASE) MCG/ACT inhaler, Inhale 1 puff into the lungs every 6 (six) hours as needed for wheezing or shortness of breath., Disp: 1 Inhaler, Rfl: 3 .  allopurinol (ZYLOPRIM) 300 MG tablet, TAKE 1 TABLET (300 MG TOTAL) BY MOUTH DAILY., Disp: 90 tablet, Rfl: 1 .  losartan (COZAAR) 50 MG tablet, TAKE 1 TABLET (50 MG TOTAL) BY MOUTH DAILY., Disp: 30 tablet, Rfl: 6 .  magnesium oxide (MAG-OX) 400 MG tablet, Take 1 tablet (400 mg total) by mouth 2 (two) times daily.,  Disp: 60 tablet, Rfl: 11 .  meloxicam (MOBIC) 15 MG tablet, , Disp: , Rfl:  .  metFORMIN (GLUCOPHAGE) 500 MG tablet, TAKE 1 TABLET (500 MG TOTAL) BY MOUTH 2 (TWO) TIMES DAILY WITH A MEAL., Disp: 60 tablet, Rfl: 11 .  clindamycin (CLEOCIN) 300 MG capsule, Take 1 capsule (300 mg total) 3 (three) times daily by mouth. (Patient not taking: Reported on 05/23/2017), Disp: 21 capsule, Rfl: 0 .  Coenzyme Q-10 200 MG CAPS, Take 1 capsule (200 mg total) by mouth daily at 2 PM. (Patient not taking: Reported on 05/23/2017), Disp: 30 capsule, Rfl: 12 .  fluticasone (FLONASE) 50 MCG/ACT nasal spray, Place 2 sprays into both nostrils daily. (Patient not taking: Reported on 05/23/2017), Disp: 16 g, Rfl: 12 .  loratadine (CLARITIN) 10 MG tablet, Take by mouth., Disp: , Rfl:  .  sildenafil (REVATIO) 20 MG tablet, Take 2-3 tablets daily as needed for E.D. (Patient not taking: Reported on 05/23/2017), Disp: 20 tablet, Rfl: 5   Patient Care Team: Jerrol Banana., MD as PCP - General (Family Medicine)      Objective:   Vitals: BP 132/62 (BP Location: Left Arm, Patient Position: Sitting, Cuff Size: Large)   Pulse 90   Temp 97.8 F (36.6 C) (Oral)   Resp 14   Ht 5\' 11"  (1.803 m)   Wt 274 lb (124.3 kg)   BMI 38.22 kg/m    Vitals:   06/28/17 1035  BP: 132/62  Pulse: 90  Resp: 14  Temp: 97.8 F (36.6 C)  TempSrc: Oral  Weight: 274 lb (124.3 kg)  Height: 5\' 11"  (1.803 m)     Physical Exam  Constitutional: He is oriented to person, place, and time. He appears well-developed and well-nourished.  HENT:  Head: Normocephalic and atraumatic.  Right Ear: External ear normal.  Left Ear: External ear normal.  Nose: Nose normal.  Mouth/Throat: Oropharynx is clear and moist.  Eyes: Conjunctivae and EOM are normal. Pupils are equal, round, and reactive to light.  Neck: Normal range of motion. Neck supple.  Cardiovascular: Normal rate, regular rhythm, normal heart sounds and intact distal pulses.    Pulmonary/Chest: Effort normal and breath sounds normal.  Abdominal: Soft. Bowel sounds are normal.  Genitourinary: Rectum normal, prostate normal and penis normal.  Genitourinary Comments: Yeast balanitis almost resolved.  Musculoskeletal: Normal range of motion.  Neurological: He is alert and oriented to person, place, and time. He has normal reflexes.  Skin: Skin is warm and dry.  Sligh rash left chest c/w tinea  Psychiatric: He has a normal mood and affect. His behavior is normal. Judgment and thought content normal.     Depression Screen PHQ 2/9 Scores 06/28/2017 02/14/2017  PHQ - 2 Score 0 0  PHQ- 9 Score 0 -      Assessment & Plan:  Routine Health Maintenance and Physical Exam  Exercise Activities and Dietary recommendations Goals    None      Immunization History  Administered Date(s) Administered  . Influenza-Unspecified 04/29/2017  . Pneumococcal Polysaccharide-23 04/29/2017  . Tdap 05/23/2017    Health Maintenance  Topic Date Due  . HIV Screening  03/12/1972  . COLONOSCOPY  11/26/2017 (Originally 03/13/2007)  . FOOT EXAM  07/26/2017  . OPHTHALMOLOGY EXAM  10/11/2017  . HEMOGLOBIN A1C  11/21/2017  . PNEUMOCOCCAL POLYSACCHARIDE VACCINE (2) 04/29/2022  . TETANUS/TDAP  05/24/2027  . INFLUENZA VACCINE  Completed  . Hepatitis C Screening  Completed     Discussed health benefits of physical activity, and encouraged him to engage in regular exercise appropriate for his age and condition.  Morbid Obesity TIIDM HLD Myalgias better off of statin. Try Zetia. Tinea Corporis Shoulder Arthropathy For surgery per Dr Raliegh Ip.   --------------------------------------------------------------------   I have done the exam and reviewed the above chart and it is accurate to the best of my knowledge. Development worker, community has been used in this note in any air is in the dictation or transcription are unintentional.  [name], MD  [place]

## 2017-07-09 DIAGNOSIS — E1159 Type 2 diabetes mellitus with other circulatory complications: Secondary | ICD-10-CM | POA: Insufficient documentation

## 2017-07-09 DIAGNOSIS — F17200 Nicotine dependence, unspecified, uncomplicated: Secondary | ICD-10-CM | POA: Insufficient documentation

## 2017-07-09 DIAGNOSIS — R011 Cardiac murmur, unspecified: Secondary | ICD-10-CM | POA: Insufficient documentation

## 2017-07-09 DIAGNOSIS — I10 Essential (primary) hypertension: Secondary | ICD-10-CM | POA: Insufficient documentation

## 2017-07-09 NOTE — Progress Notes (Signed)
Cardiology Office Note  Date:  07/11/2017   ID:  [name], DOB Feb 16, 1956, MRN [id number]  PCP:  Jerrol Banana., MD   Chief Complaint  Patient presents with  . other    Heart murmur. Pt would like to discuss if he should be on BP medications. Meds reviewed verbally with pt.    HPI:  Mr Cammack is a 62 year old gentleman with past medical history of Diabetes  HBA1C 9.9 Smoker, remote HTN Hyperlipidemia Morbid obesity  aortic valve stenosis  on echocardiogram June 2017 Mild to Moderate aortic valve stenosis Who presents by referral from Dr. Rosanna Randy for consultation of his aortic valve stenosis and murmur  In follow-up today he reports having Torn tendon right shoulder Scheduled to see sports medicine orthopedic surgery tomorrow  Referred by Dr. Rosanna Randy for murmur, Murmur noted over the past several years  Previous echocardiogram done by endocrinology for thyroid nodule, Dr. 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  PSH:   History reviewed. No pertinent surgical history.  Current Outpatient Medications  Medication Sig Dispense Refill  . albuterol (PROAIR HFA) 108 (90 BASE) MCG/ACT inhaler Inhale 1 puff into the lungs  every 6 (six) hours as needed for wheezing or shortness of breath. 1 Inhaler 3  . allopurinol (ZYLOPRIM) 300 MG tablet TAKE 1 TABLET (300 MG TOTAL) BY MOUTH DAILY. 90 tablet 1  . Cholecalciferol (VITAMIN D) 2000 units CAPS Take by mouth daily.    Marland Kitchen ezetimibe (ZETIA) 10 MG tablet Take 1 tablet (10 mg total) by mouth daily. 90 tablet 3  . losartan (COZAAR) 50 MG tablet TAKE 1 TABLET (50 MG TOTAL) BY MOUTH DAILY. 30 tablet 6  . magnesium oxide (MAG-OX) 400 MG tablet Take 1 tablet (400 mg total) by mouth 2 (two) times daily. 60 tablet 11  . meloxicam (MOBIC) 15 MG tablet Take 15 mg by mouth daily.     . metFORMIN (GLUCOPHAGE) 500 MG tablet TAKE 1 TABLET (500 MG TOTAL) BY MOUTH 2 (TWO) TIMES DAILY WITH A MEAL. 60 tablet 11   No current facility-administered medications for this visit.      Allergies:   Penicillins   Social History:  The patient  reports that he has quit smoking. His smoking use included cigars. he has never used smokeless tobacco. He reports that he drinks alcohol. He reports that he does not use drugs.   Family History:   family history includes Cancer in his father.    Review of Systems: Review of Systems  Constitutional: Negative.   Respiratory: Negative.   Cardiovascular: Negative.   Gastrointestinal: Negative.   Musculoskeletal: Negative.        Right shoulder pain  Neurological: Negative.   Psychiatric/Behavioral: Negative.   All other systems reviewed and are negative.    PHYSICAL  EXAM: VS:  BP 140/74 (BP Location: Right Arm, Patient Position: Sitting, Cuff Size: Normal)   Pulse 80   Ht 5\' 11"  (1.803 m)   Wt 271 lb 8 oz (123.2 kg)   BMI 37.87 kg/m  , BMI Body mass index is 37.87 kg/m. GEN: Well nourished, well developed, in no acute distress obese HEENT: normal  Neck: no JVD, carotid bruits, or masses Cardiac: RRR; no murmurs, rubs, or gallops,no edema  Respiratory:  clear to auscultation bilaterally, normal work of breathing GI: soft, nontender,  nondistended, + BS MS: no deformity or atrophy  Skin: warm and dry, no rash Neuro:  Strength and sensation are intact Psych: euthymic mood, full affect    Recent Labs: 02/14/2017: Magnesium 1.8 05/23/2017: ALT 68; BUN 11; Creat 0.77; Hemoglobin 14.9; Platelets 136; Potassium 4.3; Sodium 136; TSH 1.99    Lipid Panel Lab Results  Component Value Date   CHOL 251 (H) 05/23/2017   HDL 34 (L) 05/23/2017   LDLCALC 89 07/26/2016   TRIG 489 (H) 05/23/2017      Wt Readings from Last 3 Encounters:  07/11/17 271 lb 8 oz (123.2 kg)  06/28/17 274 lb (124.3 kg)  05/23/17 279 lb (126.6 kg)       ASSESSMENT AND PLAN:  Poorly controlled type 2 diabetes mellitus (HCC) Recommended weight loss, low carbohydrates Dietary guide provided Recommended regular walking for weight loss  Mixed hyperlipidemia Tolerating Zetia, started several weeks ago Would likely benefit from low-dose statin. Reports having difficulty on 2 statins, possible myalgias CT coronary calcium score discussed with him, could be done for risk stratification He will think about this and let us know   Essential hypertension Blood pressure is well controlled on today's visit. No changes made to the medications.  Smoker Reports that he stop smoking   Murmur - Plan: EKG 12-Lead Secondary to aortic valve stenosis  as documented on echocardiogram June 2017   Aortic valve stenosis, etiology of cardiac valve disease unspecified - Plan: ECHOCARDIOGRAM COMPLETE Recommended repeat echocardiogram June 2019   currently asymptomatic   discussed surgical options if valve stenosis becomes severe or critical  Erectile dysfunction Acceptable risk for him to take medications for ED  Preoperative cardiovascular Acceptable risk for shoulder surgery if needed No further testing needed  Patient seen in consultation for Dr. Rosanna Randy and will be referred back to his office for ongoing care of the issues to tell above   Total  encounter time more than 60 minutes  Greater than 50% was spent in counseling and coordination of care with the patient  Disposition:   F/U  12 months   Orders Placed This Encounter  Procedures  . EKG 12-Lead  . ECHOCARDIOGRAM COMPLETE     Signed, Esmond Plants, M.D., Ph.D. 07/11/2017  [place], [place]

## 2017-07-11 ENCOUNTER — Encounter: Payer: Self-pay | Admitting: *Deleted

## 2017-07-11 ENCOUNTER — Encounter: Payer: Self-pay | Admitting: Cardiovascular Disease

## 2017-07-11 ENCOUNTER — Ambulatory Visit: Payer: 59 | Admitting: Cardiovascular Disease

## 2017-07-11 VITALS — BP 140/74 | HR 80 | Ht 71.0 in | Wt 271.5 lb

## 2017-07-11 DIAGNOSIS — F17200 Nicotine dependence, unspecified, uncomplicated: Secondary | ICD-10-CM

## 2017-07-11 DIAGNOSIS — I350 Nonrheumatic aortic (valve) stenosis: Secondary | ICD-10-CM

## 2017-07-11 DIAGNOSIS — I060 Rheumatic aortic stenosis: Secondary | ICD-10-CM

## 2017-07-11 DIAGNOSIS — N521 Erectile dysfunction due to diseases classified elsewhere: Secondary | ICD-10-CM

## 2017-07-11 DIAGNOSIS — E1165 Type 2 diabetes mellitus with hyperglycemia: Secondary | ICD-10-CM

## 2017-07-11 DIAGNOSIS — E782 Mixed hyperlipidemia: Secondary | ICD-10-CM | POA: Diagnosis not present

## 2017-07-11 DIAGNOSIS — E1169 Type 2 diabetes mellitus with other specified complication: Secondary | ICD-10-CM

## 2017-07-11 DIAGNOSIS — R011 Cardiac murmur, unspecified: Secondary | ICD-10-CM

## 2017-07-11 DIAGNOSIS — I10 Essential (primary) hypertension: Secondary | ICD-10-CM

## 2017-07-11 NOTE — Patient Instructions (Addendum)
Medication Instructions:   No medication changes made  Labwork:  No new labs needed  Testing/Procedures:  We will schedule an echocardiogram for aortic valve stenosis June 2018   Follow-Up: It was a pleasure seeing you in the office today. Please call us if you have new issues that need to be addressed before your next appt.  [phone]  Your physician wants you to follow-up in: 12 months.  You will receive a reminder letter in the mail two months in advance. If you don't receive a letter, please call our office to schedule the follow-up appointment.  If you need a refill on your cardiac medications before your next appointment, please call your pharmacy.

## 2017-07-13 ENCOUNTER — Other Ambulatory Visit: Payer: Self-pay | Admitting: Orthopedic Surgery

## 2017-07-13 ENCOUNTER — Telehealth: Payer: Self-pay | Admitting: Cardiovascular Disease

## 2017-07-13 NOTE — Telephone Encounter (Signed)
   [facility] Pre-operative Risk Assessment    Request for surgical clearance:  1. What type of surgery is being performed? Right shoulder arthroscopy  When is this surgery scheduled? Not listed  2. What type of clearance is required (medical clearance vs. Pharmacy clearance to hold med vs. Both)? Not listed  3. Are there any medications that need to be held prior to surgery and how long?not listed  4. Practice name and name of physician performing surgery? [facility], Dr. Mack Guise  5. What is your office phone and fax number? [phone], fax [phone]  6. Anesthesia type (None, local, MAC, general) ? general   Lucienne Minks 07/13/2017, 7:20 AM  _________________________________________________________________   (provider comments below)

## 2017-07-13 NOTE — Telephone Encounter (Signed)
Office visit note with cardiac clearance routed to number provided.

## 2017-07-15 ENCOUNTER — Encounter: Payer: Self-pay | Admitting: *Deleted

## 2017-07-15 NOTE — Telephone Encounter (Signed)
Faxed most recent OV notes addressing cardiac clearance to [facility], [phone]. Called [facility] to confirm receipt but Judeen Hammans, Medical sales representative, is not in office.  LM for her to call back Monday if not received.  Left detailed message on pt's home VM (ok per DPR) with update.

## 2017-07-15 NOTE — Telephone Encounter (Signed)
Patient calling in regards to cardiac clearance for [facility] Checking on status - [facility] as of today states they have not received

## 2017-07-16 ENCOUNTER — Other Ambulatory Visit: Payer: Self-pay | Admitting: Family Medicine

## 2017-07-16 DIAGNOSIS — I10 Essential (primary) hypertension: Secondary | ICD-10-CM

## 2017-07-19 ENCOUNTER — Other Ambulatory Visit: Payer: Self-pay

## 2017-07-19 ENCOUNTER — Telehealth: Payer: Self-pay | Admitting: Gastroenterology

## 2017-07-19 ENCOUNTER — Encounter
Admission: RE | Admit: 2017-07-19 | Discharge: 2017-07-19 | Disposition: A | Discharge status: Home / Self Care | Payer: 59 | Source: Ambulatory Visit | Attending: Orthopedic Surgery | Admitting: Orthopedic Surgery

## 2017-07-19 DIAGNOSIS — I350 Nonrheumatic aortic (valve) stenosis: Secondary | ICD-10-CM | POA: Diagnosis not present

## 2017-07-19 DIAGNOSIS — M109 Gout, unspecified: Secondary | ICD-10-CM | POA: Diagnosis not present

## 2017-07-19 DIAGNOSIS — E785 Hyperlipidemia, unspecified: Secondary | ICD-10-CM | POA: Diagnosis not present

## 2017-07-19 DIAGNOSIS — N521 Erectile dysfunction due to diseases classified elsewhere: Secondary | ICD-10-CM | POA: Insufficient documentation

## 2017-07-19 DIAGNOSIS — E1169 Type 2 diabetes mellitus with other specified complication: Secondary | ICD-10-CM | POA: Insufficient documentation

## 2017-07-19 DIAGNOSIS — R011 Cardiac murmur, unspecified: Secondary | ICD-10-CM | POA: Diagnosis not present

## 2017-07-19 DIAGNOSIS — F17200 Nicotine dependence, unspecified, uncomplicated: Secondary | ICD-10-CM | POA: Insufficient documentation

## 2017-07-19 DIAGNOSIS — I10 Essential (primary) hypertension: Secondary | ICD-10-CM | POA: Insufficient documentation

## 2017-07-19 DIAGNOSIS — J45909 Unspecified asthma, uncomplicated: Secondary | ICD-10-CM | POA: Diagnosis not present

## 2017-07-19 DIAGNOSIS — Z01812 Encounter for preprocedural laboratory examination: Secondary | ICD-10-CM | POA: Diagnosis not present

## 2017-07-19 DIAGNOSIS — E559 Vitamin D deficiency, unspecified: Secondary | ICD-10-CM | POA: Insufficient documentation

## 2017-07-19 DIAGNOSIS — E1165 Type 2 diabetes mellitus with hyperglycemia: Secondary | ICD-10-CM | POA: Insufficient documentation

## 2017-07-19 HISTORY — DX: Cardiac murmur, unspecified: R01.1

## 2017-07-19 HISTORY — DX: Essential (primary) hypertension: I10

## 2017-07-19 LAB — CBC WITH DIFFERENTIAL/PLATELET
Basophils Absolute: 0 10*3/uL (ref 0–0.1)
Basophils Relative: 1 %
EOS ABS: 0.1 10*3/uL (ref 0–0.7)
EOS PCT: 1 %
HCT: 46.6 % (ref 40.0–52.0)
Hemoglobin: 15.5 g/dL (ref 13.0–18.0)
LYMPHS ABS: 2.1 10*3/uL (ref 1.0–3.6)
Lymphocytes Relative: 31 %
MCH: 30.3 pg (ref 26.0–34.0)
MCHC: 33.4 g/dL (ref 32.0–36.0)
MCV: 90.7 fL (ref 80.0–100.0)
MONO ABS: 0.5 10*3/uL (ref 0.2–1.0)
Monocytes Relative: 7 %
Neutro Abs: 4.1 10*3/uL (ref 1.4–6.5)
Neutrophils Relative %: 60 %
PLATELETS: 131 10*3/uL — AB (ref 150–440)
RBC: 5.13 MIL/uL (ref 4.40–5.90)
RDW: 13.4 % (ref 11.5–14.5)
WBC: 6.8 10*3/uL (ref 3.8–10.6)

## 2017-07-19 LAB — BASIC METABOLIC PANEL
Anion gap: 9 (ref 5–15)
BUN: 15 mg/dL (ref 6–20)
CHLORIDE: 101 mmol/L (ref 101–111)
CO2: 24 mmol/L (ref 22–32)
CREATININE: 0.88 mg/dL (ref 0.61–1.24)
Calcium: 9.6 mg/dL (ref 8.9–10.3)
GFR calc Af Amer: >60 mL/min (ref >60)
GFR calc non Af Amer: >60 mL/min (ref >60)
GLUCOSE: 370 mg/dL — AB (ref 65–99)
Potassium: 4.3 mmol/L (ref 3.5–5.1)
Sodium: 134 mmol/L — ABNORMAL LOW (ref 135–145)

## 2017-07-19 LAB — APTT: APTT: 26 s (ref 24–36)

## 2017-07-19 LAB — PROTIME-INR
INR: 0.91
PROTHROMBIN TIME: 12.2 s (ref 11.4–15.2)

## 2017-07-19 NOTE — Telephone Encounter (Signed)
Patient called to schedule colonoscopy.

## 2017-07-19 NOTE — Patient Instructions (Signed)
Your procedure is scheduled on:  Tuesday, July 26, 2017 Report to Same Day Surgery on the 2nd floor in the [facility]. To find out your arrival time, please call [phone] between 1PM - 3PM on: Monday, July 25, 2017  REMEMBER: Instructions that are not followed completely may result in serious medical risk, up to and including death; or upon the discretion of your surgeon and anesthesiologist your surgery may need to be rescheduled.  Do not eat food after midnight the night before your procedure.  No gum chewing or hard candies.  You may however, drink water up to 2 hours before you are scheduled to arrive at the hospital for your procedure.  Do not drink water within 2 hours of the start of your surgery.  No Alcohol for 24 hours before or after surgery.  No Smoking including e-cigarettes for 24 hours prior to surgery. No chewable tobacco products for at least 6 hours prior to surgery. No nicotine patches on the day of surgery.  On the morning of surgery brush your teeth with toothpaste and water, you may rinse your mouth with mouthwash if you wish. Do not swallow any  toothpaste of mouthwash.  Notify your doctor if there is any change in your medical condition (cold, fever, infection).  Do not wear jewelry, make-up, hairpins, clips or nail polish.  Do not wear lotions, powders, or perfumes. You may wear deodorant.  Do not shave 48 hours prior to surgery. Men may shave face and neck.  Contacts and dentures may not be worn into surgery.  Do not bring valuables to the hospital. [facility] is not responsible for any belongings or valuables.  TAKE THESE MEDICATIONS THE MORNING OF SURGERY WITH A SIP OF WATER:  1.  Albuterol inhaler (and bring to hospital with you)  Use CHG Soap as directed on instruction sheet.  Stop Metformin 2 days prior to surgery. Last dose taken on Saturday, February 2); then resume after surgery.  NOW!  Stop Anti-inflammatories such as  MELOXICAM, Advil, Aleve, Ibuprofen, Motrin, Naproxen, Naprosyn, Goodie powder, or aspirin products. (May take Tylenol or Acetaminophen if needed.)  NOW!  Stop ANY OVER THE COUNTER supplements until after surgery. (May continue Vitamin D.)  If you are being discharged the day of surgery, you will not be allowed to drive home. You will need someone to drive you home and stay with you that night.   If you are taking public transportation, you will need to have a responsible adult to with you.  Please call the number above if you have any questions about these instructions.

## 2017-07-20 NOTE — Pre-Procedure Instructions (Signed)
Dr. Rosey Bath (anesthesia) notified of increased glucose level on pre-admission testing labs and that this patient has medical and cardiac clearances on the chart. Ok to proceed with surgery. Will check FBS morning of surgery. Labs faxed to Dr. Harden Mo office for review also.

## 2017-07-20 NOTE — Telephone Encounter (Signed)
Returned patients call to schedule colonoscopy.  Unable to leave message on cell or home no voice mail.

## 2017-07-21 ENCOUNTER — Telehealth: Payer: Self-pay | Admitting: Family Medicine

## 2017-07-21 NOTE — Telephone Encounter (Signed)
Jeffery West with [facility] stated Dr. Mack Guise asked that we recheck pt blood sugar as a fasting lab. Pt is scheduled for surgery on 07/26/17. Pt had his sugar checked at the hospital on 07/19/17 and it was 370 non-fasting. Dr. Mack Guise is asking that it be rechecked and discussed prior to pt having surgery on 07/26/17. Please advise. Thanks TNP  CB# [phone]

## 2017-07-21 NOTE — Telephone Encounter (Signed)
He needs fasting random sugar and appt early am to adjust. Maybe Monday with me or tomorrow with Dr B?

## 2017-07-21 NOTE — Telephone Encounter (Signed)
Is this ok to just order or do you need to see patient for an appointment?-Yashua Bracco V Khairi Garman, RMA

## 2017-07-21 NOTE — Telephone Encounter (Signed)
Pt called to make sure that [facility] contacted [place] about his sugar and get an update on what he should do about getting it rechecked. Pt is concerned that this will delay his surgery that is scheduled for 07/26/17.   Pt requested a call back on CB#[phone]. Pt stated that if he doesn't answer to please leave him a message. Pt stated not to leave a message on his cell phone b/c he is likely not to get the message.  Please advise. Thanks TNP

## 2017-07-21 NOTE — Telephone Encounter (Signed)
Pt advised and appointment made to see Fenton Malling, Venia Carbon, RMA

## 2017-07-21 NOTE — Progress Notes (Signed)
Patient: Jeffery West Male    DOB: 1955-06-27   62 y.o.   MRN: [id number] Visit Date: 07/22/2017  Today's Provider: Mar Daring, PA-C   Chief Complaint  Patient presents with  . Diabetes   Subjective:    HPI Patient here today to recheck fasting blood sugar.  Patient was seen at [facility] for pre admission for surgery on 07/19/17 and non fasting blood sugar was over 370. Patient is scheduled to have torn tendon right shoulder surgery with Dr. Mack Guise on 07/26/2017. Patient reports that he does not have a meter at home to check fasting blood sugars. Patient is currently on Metformin 500mg  BID. Last A1c was 9.9 on 12.3.18.     Allergies  Allergen Reactions  . Penicillins Hives, Swelling and Other (See Comments)    Has patient had a PCN reaction causing immediate rash, facial/tongue/throat swelling, SOB or lightheadedness with hypotension: Unknown Has patient had a PCN reaction causing severe rash involving mucus membranes or skin necrosis: No Has patient had a PCN reaction that required hospitalization: Yes Has patient had a PCN reaction occurring within the last 10 years: No If all of the above answers are "NO", then may proceed with Cephalosporin use.      Current Outpatient Medications:  .  acetaminophen (TYLENOL) 500 MG tablet, Take 1,000 mg by mouth every 6 (six) hours as needed for moderate pain or headache., Disp: , Rfl:  .  albuterol (PROAIR HFA) 108 (90 BASE) MCG/ACT inhaler, Inhale 1 puff into the lungs every 6 (six) hours as needed for wheezing or shortness of breath. (Patient taking differently: Inhale 1-2 puffs into the lungs every 6 (six) hours as needed for wheezing or shortness of breath. ), Disp: 1 Inhaler, Rfl: 3 .  allopurinol (ZYLOPRIM) 300 MG tablet, TAKE 1 TABLET (300 MG TOTAL) BY MOUTH DAILY., Disp: 90 tablet, Rfl: 1 .  Carboxymethylcellulose Sodium (MOISTURIZING LUBRICANT EYE OP), Place 1 drop into both eyes daily as needed (for dry eyes)., Disp: ,  Rfl:  .  Cholecalciferol (VITAMIN D) 2000 units CAPS, Take 2,000 Units by mouth every evening. , Disp: , Rfl:  .  ezetimibe (ZETIA) 10 MG tablet, Take 1 tablet (10 mg total) by mouth daily., Disp: 90 tablet, Rfl: 3 .  losartan (COZAAR) 50 MG tablet, TAKE 1 TABLET BY MOUTH EVERY DAY, Disp: 30 tablet, Rfl: 6 .  magnesium oxide (MAG-OX) 400 MG tablet, Take 1 tablet (400 mg total) by mouth 2 (two) times daily., Disp: 60 tablet, Rfl: 11 .  meloxicam (MOBIC) 15 MG tablet, Take 15 mg by mouth daily. , Disp: , Rfl:  .  metFORMIN (GLUCOPHAGE) 500 MG tablet, TAKE 1 TABLET (500 MG TOTAL) BY MOUTH 2 (TWO) TIMES DAILY WITH A MEAL., Disp: 60 tablet, Rfl: 11  Review of Systems  Constitutional: Negative.   HENT: Negative.   Cardiovascular: Negative.   Endocrine: Negative.   Musculoskeletal: Positive for arthralgias.    Social History   Tobacco Use  . Smoking status: Former Smoker    Types: Cigars  . Smokeless tobacco: Never Used  . Tobacco comment: every once in a while, very rare.  Substance Use Topics  . Alcohol use: Yes    Alcohol/week: 0.0 oz    Comment: social   Objective:   BP 130/68 (BP Location: Left Arm, Patient Position: Sitting, Cuff Size: Large)   Pulse 84   Temp 97.9 F (36.6 C) (Oral)   Resp 16   Wt 274  lb (124.3 kg)   SpO2 96%   BMI 38.22 kg/m  Vitals:   07/22/17 0811  BP: 130/68  Pulse: 84  Resp: 16  Temp: 97.9 F (36.6 C)  TempSrc: Oral  SpO2: 96%  Weight: 274 lb (124.3 kg)     Physical Exam  Constitutional: He appears well-developed and well-nourished. No distress.  HENT:  Head: Normocephalic and atraumatic.  Neck: Normal range of motion. Neck supple.  Cardiovascular: Normal rate, regular rhythm and normal heart sounds. Exam reveals no gallop and no friction rub.  No murmur heard. Pulmonary/Chest: Effort normal and breath sounds normal. No respiratory distress. He has no wheezes. He has no rales.  Musculoskeletal: He exhibits no edema.  Skin: He is not  diaphoretic.  Vitals reviewed.      Assessment & Plan:     1. Type 2 diabetes mellitus without complication, without long-term current use of insulin (HCC) Fasting glucose this morning is 316. Patient was instructed on how to perform insulin injections with autoinjector pen today in the office. He performed the first injection today in the office. Instructed to start home injections Saturday night before bedtime. He is on 24 units of Tresiba for the next 4 days prior to surgery to optimize sugars. He is to continue metformin 500mg  BID. Call if has any questions.  - POCT CBG (Fasting - Glucose) - insulin degludec (TRESIBA) 100 UNIT/ML SOPN FlexTouch Pen; Inject 0.24 mLs (24 Units total) into the skin daily at 10 pm.  Dispense: 1 pen; Refill: 0       Mar Daring, PA-C  [facility]

## 2017-07-22 ENCOUNTER — Ambulatory Visit (INDEPENDENT_AMBULATORY_CARE_PROVIDER_SITE_OTHER): Payer: 59 | Admitting: Physician Assistant

## 2017-07-22 ENCOUNTER — Encounter: Payer: Self-pay | Admitting: Physician Assistant

## 2017-07-22 VITALS — BP 130/68 | HR 84 | Temp 97.9°F | Resp 16 | Wt 274.0 lb

## 2017-07-22 DIAGNOSIS — E119 Type 2 diabetes mellitus without complications: Secondary | ICD-10-CM | POA: Diagnosis not present

## 2017-07-22 LAB — POCT CBG (FASTING - GLUCOSE)-MANUAL ENTRY: GLUCOSE FASTING, POC: 313 mg/dL — AB (ref 70–99)

## 2017-07-22 MED ORDER — INSULIN DEGLUDEC 100 UNIT/ML ~~LOC~~ SOPN
24.0000 [IU] | PEN_INJECTOR | Freq: Every day | SUBCUTANEOUS | 0 refills | Status: DC
Start: 2017-07-22 — End: 2017-08-08

## 2017-07-22 NOTE — Patient Instructions (Signed)
Insulin Degludec injection What is this medicine? INSULIN DEGLUDEC (IN su lin de GLOO dek) is a human-made form of insulin. This drug lowers the amount of sugar in your blood. It is a long-acting insulin that is usually given once a day. This medicine may be used for other purposes; ask your health care provider or pharmacist if you have questions. COMMON BRAND NAME(S): Tyler Aas What should I tell my health care provider before I take this medicine? They need to know if you have any of these conditions: -episodes of low blood sugar -kidney disease -liver disease -an unusual or allergic reaction to insulin, other medicines, foods, dyes, or preservatives -pregnant or trying to get pregnant -breast-feeding How should I use this medicine? This medicine is for injection under the skin. Use exactly as directed. This insulin should never be mixed in the same syringe with other insulins before injection. Do not vigorously shake before use. You will be taught how to use this medicine and how to adjust doses for activities and illness. Do not use more insulin than prescribed. Always check the appearance of your insulin before using it. This medicine should be clear and colorless like water. Do not use it if it is cloudy, thickened, colored, or has solid particles in it. It is important that you put your used needles and syringes in a special sharps container. Do not put them in a trash can. If you do not have a sharps container, call your pharmacist or healthcare provider to get one. Talk to your pediatrician regarding the use of this medicine in children. While this drug may be prescribed for children as young as 1 year for selected conditions, precautions do apply. Overdosage: If you think you have taken too much of this medicine contact a poison control center or emergency room at once. NOTE: This medicine is only for you. Do not share this medicine with others. What if I miss a dose? For adults: If you  miss a dose, take it as soon as you can. Make sure your next dose is taken at least 8 hours later. Do not take double or extra doses. For adolescents and children: It is important not to miss a dose. Your health care professional or doctor should discuss a plan for missed doses with you. If you do miss a dose, follow their plan. Do not take double doses. What may interact with this medicine? -other medicines for diabetes Many medications may cause changes in blood sugar, these include: -alcohol containing beverages -antiviral medicines for HIV or AIDS -aspirin and aspirin-like drugs -certain medicines for blood pressure, heart disease, irregular heart beat -chromium -diuretics -male hormones, such as estrogens or progestins, birth control pills -fenofibrate -gemfibrozil -isoniazid -lanreotide -male hormones or anabolic steroids -MAOIs like Carbex, Eldepryl, Marplan, Nardil, and Parnate -medicines for weight loss -medicines for allergies, asthma, cold, or cough -medicines for depression, anxiety, or psychotic disturbances -niacin -nicotine -NSAIDs, medicines for pain and inflammation, like ibuprofen or naproxen -octreotide -pasireotide -pentamidine -phenytoin -probenecid -quinolone antibiotics such as ciprofloxacin, levofloxacin, ofloxacin -some herbal dietary supplements -steroid medicines such as prednisone or cortisone -sulfamethoxazole; trimethoprim -thyroid hormones Some medications can hide the warning symptoms of low blood sugar (hypoglycemia). You may need to monitor your blood sugar more closely if you are taking one of these medications. These include: -beta-blockers, often used for high blood pressure or heart problems (examples include atenolol, metoprolol, propranolol) -clonidine -guanethidine -reserpine This list may not describe all possible interactions. Give your health care provider a  list of all the medicines, herbs, non-prescription drugs, or dietary  supplements you use. Also tell them if you smoke, drink alcohol, or use illegal drugs. Some items may interact with your medicine. What should I watch for while using this medicine? Visit your health care professional or doctor for regular checks on your progress. A test called the HbA1C (A1C) will be monitored. This is a simple blood test. It measures your blood sugar control over the last 2 to 3 months. You will receive this test every 3 to 6 months. Learn how to check your blood sugar. Learn the symptoms of low and high blood sugar and how to manage them. Always carry a quick-source of sugar with you in case you have symptoms of low blood sugar. Examples include hard sugar candy or glucose tablets. Make sure others know that you can choke if you eat or drink when you develop serious symptoms of low blood sugar, such as seizures or unconsciousness. They must get medical help at once. Tell your doctor or health care professional if you have high blood sugar. You might need to change the dose of your medicine. If you are sick or exercising more than usual, you might need to change the dose of your medicine. Do not skip meals. Ask your doctor or health care professional if you should avoid alcohol. Many nonprescription cough and cold products contain sugar or alcohol. These can affect blood sugar. Make sure that you have the right kind of syringe for the type of insulin you use. Try not to change the brand and type of insulin or syringe unless your health care professional or doctor tells you to. Switching insulin brand or type can cause dangerously high or low blood sugar. Always keep an extra supply of insulin, syringes, and needles on hand. Use a syringe one time only. Throw away syringe and needle in a closed container to prevent accidental needle sticks. Insulin pens and cartridges should never be shared. Even if the needle is changed, sharing may result in passing of viruses like hepatitis or  HIV. Wear a medical ID bracelet or chain, and carry a card that describes your disease and details of your medicine and dosage times. What side effects may I notice from receiving this medicine? Side effects that you should report to your doctor or health care professional as soon as possible: -allergic reactions like skin rash, itching or hives, swelling of the face, lips, or tongue -breathing problems -signs and symptoms of high blood sugar such as dizziness, dry mouth, dry skin, fruity breath, nausea, stomach pain, increased hunger or thirst, increased urination -signs and symptoms of low blood sugar such as feeling anxious, confusion, dizziness, increased hunger, unusually weak or tired, sweating, shakiness, cold, irritable, headache, blurred vision, fast heartbeat, loss of consciousness Side effects that usually do not require medical attention (report to your doctor or health care professional if they continue or are bothersome): -increase or decrease in fatty tissue under the skin due to overuse of a particular injection site -itching, burning, swelling, or rash at site where injected This list may not describe all possible side effects. Call your doctor for medical advice about side effects. You may report side effects to FDA at 1-800-FDA-1088. Where should I keep my medicine? Keep out of the reach of children. Store unopened pen in a refrigerator between 2 and 8 degrees C (36 and 46 degrees F). Do not freeze or store directly next to the refrigerator cooling element. Do not use  if the insulin if it has been frozen. Store an in-use pen in a refrigerator between 2 and 8 degrees C (36 and 46 degrees F) or at room temperature, below 30 degrees C (86 degrees F). The in-use pen may be used for up to 56 days (8 weeks) after being opened, if it is refrigerated or kept at room temperature. Protect from light and excessive heat. Throw away any unused medicine after the expiration date or after the  specified time for room temperature storage has passed. NOTE: This sheet is a summary. It may not cover all possible information. If you have questions about this medicine, talk to your doctor, pharmacist, or health care provider.  2018 Elsevier/Gold Standard (2015-12-30 14:31:11)

## 2017-07-25 MED ORDER — VANCOMYCIN HCL 10 G IV SOLR
1500.0000 mg | INTRAVENOUS | Status: AC
  Administered 2017-07-26: 1500 mg via INTRAVENOUS
  Filled 2017-07-25: qty 1500

## 2017-07-25 NOTE — Anesthesia Pain Management Evaluation Note (Signed)
STARTED ON INSULIN 3 DAYS AGO. LD METFORMIN 07/24/17. OK TO TAKE FULL DOSE INSULIN TONIGHT AS INSTRUCTED BY PCP.

## 2017-07-25 NOTE — Anesthesia Pain Management Evaluation Note (Signed)
STARTED ON INSULIN BY PCP SO CAN PROCEED WITH SURGERY 07/26/17

## 2017-07-25 NOTE — Pre-Procedure Instructions (Signed)
PATIENT CALLED IN WITH QUESTIONS ABOUT MEDS. DISCUSSED INSTRUCTIONS BY HIS PCP. INSTRUCED NO METFORMIN TODAY,LD 07/24/17. TO TAKE FULL DOSE OF INSULIN TONIGHT.

## 2017-07-25 NOTE — Pre-Procedure Instructions (Signed)
NOTE FROM PCP 07/22/17 STARTED ON INSULIN  AND INSTRUCTED BY THEM AS TO DOSAGE UNTIL SURGERY 07/26/17

## 2017-07-26 ENCOUNTER — Other Ambulatory Visit: Payer: Self-pay

## 2017-07-26 ENCOUNTER — Ambulatory Visit
Admission: RE | Admit: 2017-07-26 | Discharge: 2017-07-26 | Disposition: A | Discharge status: Home / Self Care | Payer: No Typology Code available for payment source | Source: Ambulatory Visit | Attending: Orthopedic Surgery | Admitting: Orthopedic Surgery

## 2017-07-26 ENCOUNTER — Telehealth: Payer: Self-pay

## 2017-07-26 ENCOUNTER — Encounter: Payer: Self-pay | Admitting: *Deleted

## 2017-07-26 ENCOUNTER — Encounter
Admission: RE | Disposition: A | Discharge status: Home / Self Care | Payer: Self-pay | Source: Ambulatory Visit | Attending: Orthopedic Surgery

## 2017-07-26 ENCOUNTER — Ambulatory Visit: Payer: No Typology Code available for payment source | Admitting: Anesthesiology

## 2017-07-26 DIAGNOSIS — Z825 Family history of asthma and other chronic lower respiratory diseases: Secondary | ICD-10-CM | POA: Diagnosis not present

## 2017-07-26 DIAGNOSIS — M19011 Primary osteoarthritis, right shoulder: Secondary | ICD-10-CM | POA: Insufficient documentation

## 2017-07-26 DIAGNOSIS — Y939 Activity, unspecified: Secondary | ICD-10-CM | POA: Insufficient documentation

## 2017-07-26 DIAGNOSIS — M75121 Complete rotator cuff tear or rupture of right shoulder, not specified as traumatic: Secondary | ICD-10-CM | POA: Insufficient documentation

## 2017-07-26 DIAGNOSIS — Z809 Family history of malignant neoplasm, unspecified: Secondary | ICD-10-CM | POA: Diagnosis not present

## 2017-07-26 DIAGNOSIS — E119 Type 2 diabetes mellitus without complications: Secondary | ICD-10-CM | POA: Diagnosis not present

## 2017-07-26 DIAGNOSIS — Z794 Long term (current) use of insulin: Secondary | ICD-10-CM | POA: Insufficient documentation

## 2017-07-26 DIAGNOSIS — Z6838 Body mass index (BMI) 38.0-38.9, adult: Secondary | ICD-10-CM | POA: Insufficient documentation

## 2017-07-26 DIAGNOSIS — M109 Gout, unspecified: Secondary | ICD-10-CM | POA: Diagnosis not present

## 2017-07-26 DIAGNOSIS — E785 Hyperlipidemia, unspecified: Secondary | ICD-10-CM | POA: Diagnosis not present

## 2017-07-26 DIAGNOSIS — Z87891 Personal history of nicotine dependence: Secondary | ICD-10-CM | POA: Diagnosis not present

## 2017-07-26 DIAGNOSIS — M75101 Unspecified rotator cuff tear or rupture of right shoulder, not specified as traumatic: Secondary | ICD-10-CM | POA: Diagnosis present

## 2017-07-26 DIAGNOSIS — M1990 Unspecified osteoarthritis, unspecified site: Secondary | ICD-10-CM | POA: Insufficient documentation

## 2017-07-26 DIAGNOSIS — M7541 Impingement syndrome of right shoulder: Secondary | ICD-10-CM | POA: Insufficient documentation

## 2017-07-26 DIAGNOSIS — Z880 Allergy status to penicillin: Secondary | ICD-10-CM | POA: Insufficient documentation

## 2017-07-26 DIAGNOSIS — Z79899 Other long term (current) drug therapy: Secondary | ICD-10-CM | POA: Insufficient documentation

## 2017-07-26 DIAGNOSIS — I10 Essential (primary) hypertension: Secondary | ICD-10-CM | POA: Diagnosis not present

## 2017-07-26 DIAGNOSIS — S46111A Strain of muscle, fascia and tendon of long head of biceps, right arm, initial encounter: Secondary | ICD-10-CM | POA: Diagnosis not present

## 2017-07-26 DIAGNOSIS — E669 Obesity, unspecified: Secondary | ICD-10-CM | POA: Insufficient documentation

## 2017-07-26 DIAGNOSIS — R011 Cardiac murmur, unspecified: Secondary | ICD-10-CM | POA: Diagnosis not present

## 2017-07-26 DIAGNOSIS — X58XXXA Exposure to other specified factors, initial encounter: Secondary | ICD-10-CM | POA: Insufficient documentation

## 2017-07-26 DIAGNOSIS — E559 Vitamin D deficiency, unspecified: Secondary | ICD-10-CM | POA: Diagnosis not present

## 2017-07-26 DIAGNOSIS — J449 Chronic obstructive pulmonary disease, unspecified: Secondary | ICD-10-CM | POA: Insufficient documentation

## 2017-07-26 HISTORY — PX: SHOULDER ARTHROSCOPY WITH OPEN ROTATOR CUFF REPAIR: SHX6092

## 2017-07-26 LAB — GLUCOSE, CAPILLARY
GLUCOSE-CAPILLARY: 247 mg/dL — AB (ref 65–99)
GLUCOSE-CAPILLARY: 244 mg/dL — AB (ref 65–99)
GLUCOSE-CAPILLARY: 290 mg/dL — AB (ref 65–99)
GLUCOSE-CAPILLARY: 270 mg/dL — AB (ref 65–99)

## 2017-07-26 SURGERY — ARTHROSCOPY, SHOULDER WITH REPAIR, ROTATOR CUFF, OPEN
Anesthesia: General | Site: Shoulder | Laterality: Right | Wound class: Clean

## 2017-07-26 MED ORDER — OXYCODONE HCL 5 MG PO TABS
ORAL_TABLET | ORAL | Status: AC
  Administered 2017-07-26: 5 mg via ORAL
  Filled 2017-07-26: qty 1

## 2017-07-26 MED ORDER — INSULIN ASPART 100 UNIT/ML ~~LOC~~ SOLN
4.0000 [IU] | Freq: Once | SUBCUTANEOUS | Status: AC
  Administered 2017-07-26: 4 [IU] via SUBCUTANEOUS

## 2017-07-26 MED ORDER — LIDOCAINE HCL (CARDIAC) 20 MG/ML IV SOLN
INTRAVENOUS | Status: DC | PRN
  Administered 2017-07-26: 100 mg via INTRAVENOUS

## 2017-07-26 MED ORDER — BUPIVACAINE HCL (PF) 0.25 % IJ SOLN
INTRAMUSCULAR | Status: DC | PRN
  Administered 2017-07-26: 30 mL

## 2017-07-26 MED ORDER — SUCCINYLCHOLINE CHLORIDE 20 MG/ML IJ SOLN
INTRAMUSCULAR | Status: AC
  Filled 2017-07-26: qty 1

## 2017-07-26 MED ORDER — FENTANYL CITRATE (PF) 100 MCG/2ML IJ SOLN
INTRAMUSCULAR | Status: AC
  Administered 2017-07-26: 25 ug via INTRAVENOUS
  Filled 2017-07-26: qty 2

## 2017-07-26 MED ORDER — ONDANSETRON HCL 4 MG/2ML IJ SOLN
INTRAMUSCULAR | Status: AC
  Filled 2017-07-26: qty 2

## 2017-07-26 MED ORDER — ROCURONIUM BROMIDE 50 MG/5ML IV SOLN
INTRAVENOUS | Status: AC
  Filled 2017-07-26: qty 1

## 2017-07-26 MED ORDER — ROCURONIUM BROMIDE 100 MG/10ML IV SOLN
INTRAVENOUS | Status: DC | PRN
  Administered 2017-07-26: 50 mg via INTRAVENOUS

## 2017-07-26 MED ORDER — LIDOCAINE HCL (PF) 1 % IJ SOLN
INTRAMUSCULAR | Status: AC
  Filled 2017-07-26: qty 30

## 2017-07-26 MED ORDER — ACETAMINOPHEN 10 MG/ML IV SOLN
INTRAVENOUS | Status: AC
  Filled 2017-07-26: qty 100

## 2017-07-26 MED ORDER — ONDANSETRON HCL 4 MG/2ML IJ SOLN: 4.0000 mg | Freq: Once | INTRAMUSCULAR | Status: DC | PRN

## 2017-07-26 MED ORDER — INSULIN ASPART 100 UNIT/ML ~~LOC~~ SOLN
SUBCUTANEOUS | Status: AC
  Administered 2017-07-26: 5 [IU] via SUBCUTANEOUS
  Filled 2017-07-26: qty 1

## 2017-07-26 MED ORDER — IPRATROPIUM-ALBUTEROL 0.5-2.5 (3) MG/3ML IN SOLN
RESPIRATORY_TRACT | Status: AC
  Filled 2017-07-26: qty 3

## 2017-07-26 MED ORDER — ACETAMINOPHEN 10 MG/ML IV SOLN
INTRAVENOUS | Status: DC | PRN
  Administered 2017-07-26: 1000 mg via INTRAVENOUS

## 2017-07-26 MED ORDER — ONDANSETRON HCL 4 MG PO TABS: 4.0000 mg | ORAL_TABLET | Freq: Three times a day (TID) | ORAL | 0 refills | Status: DC | PRN

## 2017-07-26 MED ORDER — INSULIN ASPART 100 UNIT/ML ~~LOC~~ SOLN
SUBCUTANEOUS | Status: AC
  Filled 2017-07-26: qty 1

## 2017-07-26 MED ORDER — INSULIN ASPART 100 UNIT/ML ~~LOC~~ SOLN
5.0000 [IU] | Freq: Once | SUBCUTANEOUS | Status: AC
  Administered 2017-07-26: 5 [IU] via SUBCUTANEOUS

## 2017-07-26 MED ORDER — ONDANSETRON HCL 4 MG/2ML IJ SOLN
INTRAMUSCULAR | Status: DC | PRN
  Administered 2017-07-26: 4 mg via INTRAVENOUS

## 2017-07-26 MED ORDER — IPRATROPIUM-ALBUTEROL 0.5-2.5 (3) MG/3ML IN SOLN
3.0000 mL | Freq: Once | RESPIRATORY_TRACT | Status: AC
  Administered 2017-07-26: 3 mL via RESPIRATORY_TRACT

## 2017-07-26 MED ORDER — LIDOCAINE HCL (PF) 1 % IJ SOLN
INTRAMUSCULAR | Status: DC | PRN
  Administered 2017-07-26: 10 mL

## 2017-07-26 MED ORDER — FENTANYL CITRATE (PF) 100 MCG/2ML IJ SOLN
INTRAMUSCULAR | Status: AC
  Filled 2017-07-26: qty 2

## 2017-07-26 MED ORDER — SUGAMMADEX SODIUM 500 MG/5ML IV SOLN
INTRAVENOUS | Status: DC | PRN
  Administered 2017-07-26: 250 mg via INTRAVENOUS

## 2017-07-26 MED ORDER — CHLORHEXIDINE GLUCONATE CLOTH 2 % EX PADS: 6.0000 | MEDICATED_PAD | Freq: Once | CUTANEOUS | Status: DC

## 2017-07-26 MED ORDER — MIDAZOLAM HCL 2 MG/2ML IJ SOLN
INTRAMUSCULAR | Status: AC
  Filled 2017-07-26: qty 2

## 2017-07-26 MED ORDER — EPINEPHRINE PF 1 MG/ML IJ SOLN
INTRAMUSCULAR | Status: DC | PRN
  Administered 2017-07-26: 14 mL

## 2017-07-26 MED ORDER — OXYCODONE HCL 5 MG PO TABS
5.0000 mg | ORAL_TABLET | ORAL | Status: DC | PRN
  Administered 2017-07-26: 5 mg via ORAL
  Filled 2017-07-26: qty 1

## 2017-07-26 MED ORDER — EPINEPHRINE 30 MG/30ML IJ SOLN
INTRAMUSCULAR | Status: AC
  Filled 2017-07-26: qty 1

## 2017-07-26 MED ORDER — SUCCINYLCHOLINE CHLORIDE 20 MG/ML IJ SOLN
INTRAMUSCULAR | Status: DC | PRN
  Administered 2017-07-26: 120 mg via INTRAVENOUS

## 2017-07-26 MED ORDER — OXYCODONE HCL 5 MG PO TABS
5.0000 mg | ORAL_TABLET | Freq: Once | ORAL | Status: AC
  Administered 2017-07-26: 5 mg via ORAL

## 2017-07-26 MED ORDER — LIDOCAINE HCL (PF) 2 % IJ SOLN
INTRAMUSCULAR | Status: AC
  Filled 2017-07-26: qty 10

## 2017-07-26 MED ORDER — MIDAZOLAM HCL 2 MG/2ML IJ SOLN
INTRAMUSCULAR | Status: DC | PRN
  Administered 2017-07-26: 2 mg via INTRAVENOUS

## 2017-07-26 MED ORDER — SEVOFLURANE IN SOLN
RESPIRATORY_TRACT | Status: AC
  Filled 2017-07-26: qty 250

## 2017-07-26 MED ORDER — BUPIVACAINE HCL (PF) 0.25 % IJ SOLN
INTRAMUSCULAR | Status: AC
  Filled 2017-07-26: qty 30

## 2017-07-26 MED ORDER — OXYCODONE HCL 5 MG PO TABS: 5.0000 mg | ORAL_TABLET | ORAL | 0 refills | Status: DC | PRN

## 2017-07-26 MED ORDER — FENTANYL CITRATE (PF) 100 MCG/2ML IJ SOLN
25.0000 ug | INTRAMUSCULAR | Status: DC | PRN
  Administered 2017-07-26 (×4): 25 ug via INTRAVENOUS

## 2017-07-26 MED ORDER — OXYCODONE HCL 5 MG PO TABS
ORAL_TABLET | ORAL | Status: AC
  Filled 2017-07-26: qty 1

## 2017-07-26 MED ORDER — IPRATROPIUM-ALBUTEROL 0.5-2.5 (3) MG/3ML IN SOLN: 3.0000 mL | Freq: Once | RESPIRATORY_TRACT | Status: DC

## 2017-07-26 MED ORDER — PROPOFOL 10 MG/ML IV BOLUS
INTRAVENOUS | Status: AC
  Filled 2017-07-26: qty 20

## 2017-07-26 MED ORDER — FENTANYL CITRATE (PF) 100 MCG/2ML IJ SOLN
INTRAMUSCULAR | Status: DC | PRN
  Administered 2017-07-26 (×4): 50 ug via INTRAVENOUS

## 2017-07-26 MED ORDER — PROPOFOL 10 MG/ML IV BOLUS
INTRAVENOUS | Status: DC | PRN
  Administered 2017-07-26: 150 mg via INTRAVENOUS

## 2017-07-26 MED ORDER — NEOMYCIN-POLYMYXIN B GU 40-200000 IR SOLN
Status: AC
  Filled 2017-07-26: qty 2

## 2017-07-26 MED ORDER — SUGAMMADEX SODIUM 500 MG/5ML IV SOLN
INTRAVENOUS | Status: AC
  Filled 2017-07-26: qty 5

## 2017-07-26 MED ORDER — NEOMYCIN-POLYMYXIN B GU 40-200000 IR SOLN
Status: DC | PRN
  Administered 2017-07-26: 2 mL

## 2017-07-26 MED ORDER — FAMOTIDINE 20 MG PO TABS
20.0000 mg | ORAL_TABLET | Freq: Once | ORAL | Status: AC
  Administered 2017-07-26: 20 mg via ORAL

## 2017-07-26 MED ORDER — SODIUM CHLORIDE 0.9 % IV SOLN
INTRAVENOUS | Status: DC
  Administered 2017-07-26: 07:00:00 via INTRAVENOUS

## 2017-07-26 MED ORDER — FAMOTIDINE 20 MG PO TABS
ORAL_TABLET | ORAL | Status: AC
  Administered 2017-07-26: 20 mg via ORAL
  Filled 2017-07-26: qty 1

## 2017-07-26 SURGICAL SUPPLY — 73 items
ADAPTER IRRIG TUBE 2 SPIKE SOL (ADAPTER) ×6 IMPLANT
ANCHOR ALL-SUT Q-FIX 2.8 (Anchor) ×6 IMPLANT
BUR RADIUS 4.0X18.5 (BURR) ×3 IMPLANT
BUR RADIUS 5.5 (BURR) ×3 IMPLANT
CANNULA 5.75X7 CRYSTAL CLEAR (CANNULA) ×6 IMPLANT
CANNULA PARTIAL THREAD 2X7 (CANNULA) ×3 IMPLANT
CANNULA TWIST IN 8.25X9CM (CANNULA) IMPLANT
CLOSURE WOUND 1/2 X4 (GAUZE/BANDAGES/DRESSINGS) ×2
CONNECTOR PERFECT PASSER (CONNECTOR) ×3 IMPLANT
COOLER POLAR GLACIER W/PUMP (MISCELLANEOUS) ×3 IMPLANT
CRADLE LAMINECT ARM (MISCELLANEOUS) ×3 IMPLANT
DEVICE SUCT BLK HOLE OR FLOOR (MISCELLANEOUS) ×3 IMPLANT
DRAPE IMP U-DRAPE 54X76 (DRAPES) ×6 IMPLANT
DRAPE INCISE IOBAN 66X45 STRL (DRAPES) ×6 IMPLANT
DRAPE SHEET LG 3/4 BI-LAMINATE (DRAPES) ×3 IMPLANT
DRAPE U-SHAPE 47X51 STRL (DRAPES) IMPLANT
DURAPREP 26ML APPLICATOR (WOUND CARE) ×12 IMPLANT
ELECT REM PT RETURN 9FT ADLT (ELECTROSURGICAL) ×3
ELECTRODE REM PT RTRN 9FT ADLT (ELECTROSURGICAL) ×1 IMPLANT
GAUZE PETRO XEROFOAM 1X8 (MISCELLANEOUS) ×3 IMPLANT
GAUZE SPONGE 4X4 12PLY STRL (GAUZE/BANDAGES/DRESSINGS) ×6 IMPLANT
GLOVE BIOGEL PI IND STRL 9 (GLOVE) ×1 IMPLANT
GLOVE BIOGEL PI INDICATOR 9 (GLOVE) ×2
GLOVE SURG 9.0 ORTHO LTXF (GLOVE) ×6 IMPLANT
GOWN STRL REUS TWL 2XL XL LVL4 (GOWN DISPOSABLE) ×3 IMPLANT
GOWN STRL REUS W/ TWL LRG LVL3 (GOWN DISPOSABLE) ×1 IMPLANT
GOWN STRL REUS W/ TWL LRG LVL4 (GOWN DISPOSABLE) ×3 IMPLANT
GOWN STRL REUS W/TWL LRG LVL3 (GOWN DISPOSABLE) ×2
GOWN STRL REUS W/TWL LRG LVL4 (GOWN DISPOSABLE) ×6
IV LACTATED RINGER IRRG 3000ML (IV SOLUTION) ×28
IV LR IRRIG 3000ML ARTHROMATIC (IV SOLUTION) ×14 IMPLANT
KIT STABILIZATION SHOULDER (MISCELLANEOUS) ×3 IMPLANT
KIT SUTURE 2.8 Q-FIX DISP (MISCELLANEOUS) ×3 IMPLANT
KIT SUTURETAK 3.0 INSERT PERC (KITS) IMPLANT
KIT TURNOVER KIT A (KITS) ×3 IMPLANT
MANIFOLD NEPTUNE II (INSTRUMENTS) ×3 IMPLANT
MASK FACE SPIDER DISP (MASK) ×3 IMPLANT
MAT BLUE FLOOR 46X72 FLO (MISCELLANEOUS) ×6 IMPLANT
NDL SAFETY ECLIPSE 18X1.5 (NEEDLE) ×1 IMPLANT
NEEDLE HYPO 18GX1.5 SHARP (NEEDLE) ×2
NEEDLE HYPO 22GX1.5 SAFETY (NEEDLE) ×3 IMPLANT
NS IRRIG 500ML POUR BTL (IV SOLUTION) ×6 IMPLANT
PACK ARTHROSCOPY SHOULDER (MISCELLANEOUS) ×3 IMPLANT
PAD WRAPON POLAR SHDR XLG (MISCELLANEOUS) ×1 IMPLANT
PASSER SUT CAPTURE FIRST (SUTURE) ×3 IMPLANT
SET TUBE SUCT SHAVER OUTFL 24K (TUBING) ×3 IMPLANT
SET TUBE TIP INTRA-ARTICULAR (MISCELLANEOUS) ×3 IMPLANT
SLING ULTRA II LG (MISCELLANEOUS) ×3 IMPLANT
STRAP SAFETY 5IN WIDE (MISCELLANEOUS) ×6 IMPLANT
STRIP CLOSURE SKIN 1/2X4 (GAUZE/BANDAGES/DRESSINGS) ×4 IMPLANT
SUT ETHILON 4-0 (SUTURE) ×2
SUT ETHILON 4-0 FS2 18XMFL BLK (SUTURE) ×1
SUT KNTLS 2.8 MAGNUM (Anchor) ×12 IMPLANT
SUT LASSO 90 DEG SD STR (SUTURE) IMPLANT
SUT MNCRL 4-0 (SUTURE) ×2
SUT MNCRL 4-0 27XMFL (SUTURE) ×1
SUT PDS AB 0 CT1 27 (SUTURE) ×3 IMPLANT
SUT PERFECTPASSER WHITE CART (SUTURE) ×12 IMPLANT
SUT SMART STITCH CARTRIDGE (SUTURE) ×9 IMPLANT
SUT VIC AB 0 CT1 36 (SUTURE) ×3 IMPLANT
SUT VIC AB 2-0 CT2 27 (SUTURE) ×3 IMPLANT
SUTURE ETHLN 4-0 FS2 18XMF BLK (SUTURE) ×1 IMPLANT
SUTURE MAGNUM WIRE 2X48 BLK (SUTURE) IMPLANT
SUTURE MNCRL 4-0 27XMF (SUTURE) ×1 IMPLANT
SYR 10ML 18GX1 1/2 (NEEDLE) ×3 IMPLANT
SYR 10ML LL (SYRINGE) ×3 IMPLANT
SYR 3ML 18GX1 1/2 (SYRINGE) ×3 IMPLANT
TAPE MICROFOAM 4IN (TAPE) ×3 IMPLANT
TUBING ARTHRO INFLOW-ONLY STRL (TUBING) ×3 IMPLANT
TUBING CONNECTING 10 (TUBING) ×2 IMPLANT
TUBING CONNECTING 10' (TUBING) ×1
WAND HAND CNTRL MULTIVAC 90 (MISCELLANEOUS) ×3 IMPLANT
WRAPON POLAR PAD SHDR XLG (MISCELLANEOUS) ×3

## 2017-07-26 NOTE — Progress Notes (Signed)
Duo neb given for low sat and diminished breath sounds on right side

## 2017-07-26 NOTE — Discharge Instructions (Signed)

## 2017-07-26 NOTE — Anesthesia Postprocedure Evaluation (Signed)
Anesthesia Post Note  Patient: Jeffery West  Procedure(s) Performed: SHOULDER ARTHROSCOPY WITH OPEN ROTATOR CUFF REPAIR,SUBACROMINAL DECOMPRESSION,DISTAL CLAVICLE EXCISION (Right Shoulder)  Patient location during evaluation: PACU Anesthesia Type: General Level of consciousness: awake and alert and oriented Pain management: pain level controlled Vital Signs Assessment: post-procedure vital signs reviewed and stable Respiratory status: spontaneous breathing Cardiovascular status: blood pressure returned to baseline Anesthetic complications: no Comments: Post op sats running 90-94% on room air and will go up to 97% with deep breathing.  Chest is clear to auscultation.  We called the patient's MD and their office will follow the patient's sugars.     Last Vitals:  Vitals:   07/26/17 1310 07/26/17 1333  BP: (!) 158/69 (!) 163/65  Pulse: 90 93  Resp: 16 19  Temp: 36.7 C   SpO2: (!) 89%     Last Pain:  Vitals:   07/26/17 1333  TempSrc:   PainSc: 5                  Jeffery West

## 2017-07-26 NOTE — Telephone Encounter (Signed)
Patient advised as below.  

## 2017-07-26 NOTE — Telephone Encounter (Signed)
Continue Tresiba/insulin  for now.

## 2017-07-26 NOTE — Anesthesia Preprocedure Evaluation (Addendum)
Anesthesia Evaluation  Patient identified by MRN, date of birth, ID band Patient awake    Reviewed: Allergy & Precautions, NPO status , Patient's Chart, lab work & pertinent test results  Airway Mallampati: III  TM Distance: >3 FB     Dental  (+) Chipped, Caps   Pulmonary asthma , COPD,  COPD inhaler, former smoker,           Cardiovascular hypertension, Pt. on medications Normal cardiovascular exam+ Valvular Problems/Murmurs AS      Neuro/Psych negative neurological ROS     GI/Hepatic negative GI ROS, Neg liver ROS,   Endo/Other  diabetes, Poorly Controlled, Insulin Dependent, Oral Hypoglycemic Agents  Renal/GU negative Renal ROS     Musculoskeletal  (+) Arthritis , Osteoarthritis,    Abdominal (+) + obese,   Peds  Hematology negative hematology ROS (+)   Anesthesia Other Findings   Reproductive/Obstetrics                            Anesthesia Physical Anesthesia Plan  ASA: III  Anesthesia Plan: General   Post-op Pain Management:    Induction:   PONV Risk Score and Plan:   Airway Management Planned: Oral ETT  Additional Equipment:   Intra-op Plan:   Post-operative Plan: Extubation in OR  Informed Consent: I have reviewed the patients History and Physical, chart, labs and discussed the procedure including the risks, benefits and alternatives for the proposed anesthesia with the patient or authorized representative who has indicated his/her understanding and acceptance.   Dental advisory given  Plan Discussed with: CRNA and Surgeon  Anesthesia Plan Comments: (Preop sats of 93-94% with a history of reactive airway disease.  We discussed the pros and cons of doing a regional interscalene block for postop pain and decided against thia procedure for fear of driving sats lower with increased difficulty breathing.  We reserved the right to do a reduced block post op if the pain is  intractable.  Patient and family understand and agree.)       Anesthesia Quick Evaluation

## 2017-07-26 NOTE — Anesthesia Procedure Notes (Signed)
Procedure Name: Intubation Date/Time: 07/26/2017 8:08 AM Performed by: Aline Brochure, CRNA Pre-anesthesia Checklist: Patient identified, Emergency Drugs available, Suction available and Patient being monitored Patient Re-evaluated:Patient Re-evaluated prior to induction Oxygen Delivery Method: Circle system utilized Preoxygenation: Pre-oxygenation with 100% oxygen Induction Type: IV induction Ventilation: Mask ventilation with difficulty Laryngoscope Size: Mac and 4 Grade View: Grade II Tube type: Oral Tube size: 7.5 mm Number of attempts: 1 Airway Equipment and Method: Stylet Placement Confirmation: ETT inserted through vocal cords under direct vision,  positive ETCO2 and breath sounds checked- equal and bilateral Secured at: 22 cm Tube secured with: Tape Dental Injury: Teeth and Oropharynx as per pre-operative assessment

## 2017-07-26 NOTE — Progress Notes (Addendum)
Dr. Kayleen Memos stated ok for pt O2 sats to be above 89% on room air to go home. Pt O2 sats maintaining between 89-97%. Pt CBG was 290. Dr. Kayleen Memos notified. Orders received. Jorje Vanatta E 1510PM 07/26/2017

## 2017-07-26 NOTE — Progress Notes (Signed)
Pt arrival to pacu   Pulling at shoulder and crying out in pain

## 2017-07-26 NOTE — Transfer of Care (Signed)
Immediate Anesthesia Transfer of Care Note  Patient: Jeffery West  Procedure(s) Performed: SHOULDER ARTHROSCOPY WITH OPEN ROTATOR CUFF REPAIR,SUBACROMINAL DECOMPRESSION,DISTAL CLAVICLE EXCISION (Right Shoulder)  Patient Location: PACU  Anesthesia Type:General  Level of Consciousness: awake  Airway & Oxygen Therapy: Patient connected to face mask oxygen  Post-op Assessment: Post -op Vital signs reviewed and stable  Post vital signs: stable  Last Vitals:  Vitals:   07/26/17 0718 07/26/17 1110  BP: (!) 148/73 98/70  Pulse: 78 91  Resp: 16   Temp:    SpO2: 96% 97%    Last Pain:  Vitals:   07/26/17 0617  TempSrc: Temporal         Complications: No apparent anesthesia complications

## 2017-07-26 NOTE — H&P (Signed)
PREOPERATIVE H&P  Chief Complaint: M75.121 Complete rotatr-cuff tear/ruptr of r shoulder, not trauma  HPI: Jeffery West is a 62 y.o. male who presents for preoperative history and physical with a diagnosis of full thickness rotator cuff of the right shoulder. Symptoms of pain, weakness and limited ROM are impairing activities of daily living.  He has elected for surgical management.   Past Medical History:  Diagnosis Date  . Diabetes mellitus without complication ([place])   . Erectile dysfunction   . Gout   . Heart murmur   . Hyperlipidemia   . Hypertension   . Obesity   . Osteoarthritis   . Vitamin D deficiency    Past Surgical History:  Procedure Laterality Date  . NO PAST SURGERIES     Social History   Socioeconomic History  . Marital status: Married    Spouse name: None  . Number of children: None  . Years of education: None  . Highest education level: None  Social Needs  . Financial resource strain: None  . Food insecurity - worry: None  . Food insecurity - inability: None  . Transportation needs - medical: None  . Transportation needs - non-medical: None  Occupational History  . None  Tobacco Use  . Smoking status: Former Smoker    Types: Cigars  . Smokeless tobacco: Never Used  . Tobacco comment: every once in a while, very rare.  Substance and Sexual Activity  . Alcohol use: Yes    Alcohol/week: 0.0 oz    Comment: social  . Drug use: No  . Sexual activity: None  Other Topics Concern  . None  Social History Narrative  . None   Family History  Problem Relation Age of Onset  . Asthma Mother   . Cancer Father        possibly in liver and lung   Allergies  Allergen Reactions  . Penicillins Hives, Swelling and Other (See Comments)    Has patient had a PCN reaction causing immediate rash, facial/tongue/throat swelling, SOB or lightheadedness with hypotension: Unknown Has patient had a PCN reaction causing severe rash involving mucus membranes or skin  necrosis: No Has patient had a PCN reaction that required hospitalization: Yes Has patient had a PCN reaction occurring within the last 10 years: No If all of the above answers are "NO", then may proceed with Cephalosporin use.    Prior to Admission medications   Medication Sig Start Date End Date Taking? Authorizing Provider  acetaminophen (TYLENOL) 500 MG tablet Take 1,000 mg by mouth every 6 (six) hours as needed for moderate pain or headache.   Yes [provider]  albuterol (PROAIR HFA) 108 (90 BASE) MCG/ACT inhaler Inhale 1 puff into the lungs every 6 (six) hours as needed for wheezing or shortness of breath. Patient taking differently: Inhale 1-2 puffs into the lungs every 6 (six) hours as needed for wheezing or shortness of breath.  06/04/15  Yes Jerrol Banana., MD  allopurinol (ZYLOPRIM) 300 MG tablet TAKE 1 TABLET (300 MG TOTAL) BY MOUTH DAILY. 09/17/16  Yes Mar Daring, PA-C  Carboxymethylcellulose Sodium (MOISTURIZING LUBRICANT EYE OP) Place 1 drop into both eyes daily as needed (for dry eyes).   Yes [provider]  Cholecalciferol (VITAMIN D) 2000 units CAPS Take 2,000 Units by mouth every evening.    Yes [provider]  ezetimibe (ZETIA) 10 MG tablet Take 1 tablet (10 mg total) by mouth daily. 06/28/17  Yes Jerrol Banana.,  MD  insulin degludec (TRESIBA) 100 UNIT/ML SOPN FlexTouch Pen Inject 0.24 mLs (24 Units total) into the skin daily at 10 pm. 07/22/17  Yes Mar Daring, PA-C  losartan (COZAAR) 50 MG tablet TAKE 1 TABLET BY MOUTH EVERY DAY 07/16/17  Yes Jerrol Banana., MD  magnesium oxide (MAG-OX) 400 MG tablet Take 1 tablet (400 mg total) by mouth 2 (two) times daily. 02/14/17  Yes Jerrol Banana., MD  meloxicam (MOBIC) 15 MG tablet Take 15 mg by mouth daily.  02/16/17  Yes [provider]  metFORMIN (GLUCOPHAGE) 500 MG tablet TAKE 1 TABLET (500 MG TOTAL) BY MOUTH 2 (TWO) TIMES DAILY WITH A MEAL. 02/17/17   Yes Jerrol Banana., MD     Positive ROS: All other systems have been reviewed and were otherwise negative with the exception of those mentioned in the HPI and as above.  Physical Exam: General: Alert, no acute distress Cardiovascular: Regular rate and rhythm, no murmurs rubs or gallops.  No pedal edema Respiratory: Clear to auscultation bilaterally, no wheezes rales or rhonchi. No cyanosis, no use of accessory musculature GI: No organomegaly, abdomen is soft and non-tender nondistended with positive bowel sounds. Skin: Skin intact, no lesions within the operative field. Neurologic: Sensation intact distally Psychiatric: Patient is competent for consent with normal mood and affect Lymphatic: No cervical lymphadenopathy  MUSCULOSKELETAL: Right shoulder:  Skin intact.  NVI.  + External rotator weakness.   Pain with resisted abduction.  Full digit, wrist and elbow ROM.  + impingement signs, no apprehension or instability.  Assessment: M75.121 Complete rotatr-cuff tear/ruptr of r shoulder, not trauma  Plan: Plan for Procedure(s): RIGHT SHOULDER ARTHROSCOPY WITH MINI OPEN ROTATOR CUFF REPAIR  I have discussed the details of surgery and the post-op course.  I discussed the risks and benefits of surgery. The patient understands the risks include but are not limited to infection, bleeding requiring blood transfusion, nerve or blood vessel injury, joint stiffness or loss of motion, persistent pain, weakness or instability, malunion, nonunion and hardware failure and the need for further surgery. Medical risks include but are not limited to DVT and pulmonary embolism, myocardial infarction, stroke, pneumonia, respiratory failure and death. Patient understood these risks and wished to proceed.    Thornton Park, MD   07/26/2017 7:53 AM

## 2017-07-26 NOTE — Op Note (Signed)
07/26/2017  11:24 AM  PATIENT:  Jeffery West  62 y.o. male  PRE-OPERATIVE DIAGNOSIS:  Full thickness and retracted rotator cuff tear, right shoulder  POST-OPERATIVE DIAGNOSIS: Full thickness and retracted rotator cuff tear, spontaneous rupture of biceps tendon right shoulder, subacromial impingement and acromioclavicular joint arthrosis  PROCEDURE:  Procedure(s): RIGHT SHOULDER ARTHROSCOPIC SUBACROMINAL DECOMPRESSION, DISTAL CLAVICLE EXCISION WITH MINI OPEN ROTATOR CUFF REPAIR  SURGEON:  Surgeon(s) and Role:    Thornton Park, MD - Primary  ANESTHESIA:   local, general and paracervical block   PREOPERATIVE INDICATIONS:  Jeffery West is a  62 y.o. male with a diagnosis of full thickness and retracted tear of the rotator cuff or the right shoulder and elected for surgical management.    The risks benefits and alternatives were discussed with the patient preoperatively including but not limited to the risks of infection, bleeding, nerve injury, persistent pain or weakness, failure of the hardware, re-tear of the rotator cuff and the need for further surgery. Medical risks include DVT and pulmonary embolism, myocardial infarction, stroke, pneumonia, respiratory failure and death. Patient understood these risks and wished to proceed.  OPERATIVE IMPLANTS: ArthroCare Magnum 2 anchors x 4 & Smith [employer]hew Q Fix anchors x 2  OPERATIVE FINDINGS: full thickness tear of the supra and infraspinatus tendons, shoulder impingement, acromioclavicular arthritis and spontaneous rupture of the long head of the biceps tendon  OPERATIVE PROCEDURE: The patient was met in the preoperative area. The right shoulder was signed with the word yes and my initials according the hospital's correct site of surgery protocol.   History and physical was performed.   Patient was brought to the operating room where he underwent general anesthesia.  The patient was placed in a beachchair position.  A spider arm positioner  was used for this case. Examination under anesthesia revealed no limitation of motion or instability with load shift testing. The patient had a negative sulcus sign.  Patient was prepped and draped in a sterile fashion. A timeout was performed to verify the patient's name, date of birth, medical record number, correct site of surgery and correct procedure to be performed there was also used to verify the patient received antibiotics that all appropriate instruments, implants and radiographs studies were available in the room. Once all in attendance were in agreement case began.  Bony landmarks were drawn out with a surgical marker along with proposed arthroscopy incisions. These were pre-injected with 1% lidocaine plain. An 11 blade was used to establish a posterior portal through which the arthroscope was placed in the glenohumeral joint. A full diagnostic examination of the shoulder was performed.  Patient was found to have full thickness, retracted tear of the supra and infraspinatus tendons.   The arthroscope was then placed in the subacromial space.  Extensive bursitis was encountered and debrided using a 4-0 resector shaver blade and a 90 ArthroCare wand from a lateral portal which was established under direct visualization using an 18-gauge spinal needle. A subacromial decompression was also performed using a 5.5 mm resector shaver blade from the lateral portal and a distal clavicle excision from the anterior portal also using the 5.5 mm resector blade.  Four Perfect Pass sutures were placed in the lateral border of the rotator cuff tear. All arthroscopic instruments were then removed and the mini-open portion of the procedure began.   A saber-type incision was made along the lateral border of the acromion. The deltoid muscle was identified and split in line with its  fibers which allowed visualization of the rotator cuff. The Perfect Pass sutures previously placed in the lateral border of the  rotator cuff were brought out through the deltoid split.  A 5.5 mm resector shaver blade was then used to debride the greater tuberosity of all torn fibers of the rotator cuff.  Two Smith and [employer] Fix anchors were placed at the articular margin of the humeral head with the greater tuberosity. The suture limbs of the Q Fix anchor were passed medially through the rotator cuff using a First Pass suture passer.   The four Perfect Pass sutures were then anchored to the greater tuberosity footprint using four Magnum 2 anchors. These anchors were tensioned to allow for anatomic reduction of the rotator cuff to the greater tuberosity. The medial row sutures were then tied down using an arthroscopic knot tying technique.  Arthroscopic images of the repair were taken with the arthroscope both externally and from inside the glenohumeral joint.  All incisions were copiously irrigated. The deltoid fascia was repaired using a 0 Vicryl suture.  The subcutaneous tissue of all incisions were closed with a 2-0 Vicryl. Skin closure for the arthroscopic incisions was performed with 4-0 nylon. The skin edges of the saber incision was approximated with a running 4-0 undyed Monocryl.  0.25% Marcaine plain was then injected into the subacromial space for postoperative pain control. A dry sterile dressing was applied.  The patient was placed in an abduction sling and a Polar Care was applied to the shoulder.  All sharp and it instrument counts were correct at the conclusion of the case. I was scrubbed and present for the entire case. I spoke with the patient's wife postoperatively to let her know the case had gone without complication and the patient was stable in recovery room.

## 2017-07-26 NOTE — Anesthesia Post-op Follow-up Note (Signed)
Anesthesia QCDR form completed.        

## 2017-07-26 NOTE — Telephone Encounter (Signed)
Christina from post op at [facility] called to report on patients glucose results for today. Patient is at [facility] at this time for his shoulder surgery. Margreta Journey reports that patients glucose was 247 before going into surgery. Patient was tested again right after surgery and glucose was 244 he was given 4 units of insulin. Patients glucose was retested after about 2 hours and glucose was 290, they gave patient another 5 units of Novalog. Margreta Journey would like to know if any special instructions should be given to patient. Patient will be discharged this afternoon. We will need to call patient at home phone number. Please advise. sd

## 2017-07-26 NOTE — Progress Notes (Signed)
Per request of Dr. Kayleen Memos, [facility] called to notify of pt's elevated CBG prior to and after surgery. Requested for PCP to follow up with pt to recommend treatment for CBG post surgery. PCP to follow up with pt. Jeffery West E 1:50 PM 07/26/2017

## 2017-07-27 ENCOUNTER — Other Ambulatory Visit: Payer: Self-pay | Admitting: Emergency Medicine

## 2017-07-27 ENCOUNTER — Telehealth: Payer: Self-pay | Admitting: Physician Assistant

## 2017-07-27 ENCOUNTER — Encounter: Payer: Self-pay | Admitting: Orthopedic Surgery

## 2017-07-27 DIAGNOSIS — E119 Type 2 diabetes mellitus without complications: Secondary | ICD-10-CM

## 2017-07-27 MED ORDER — ONETOUCH LANCETS MISC: 1.0000 | Freq: Two times a day (BID) | 11 refills | Status: DC

## 2017-07-27 MED ORDER — ONETOUCH VERIO W/DEVICE KIT: 1.0000 | PACK | Freq: Once | Status: AC

## 2017-07-27 MED ORDER — GLUCOSE BLOOD VI STRP: ORAL_STRIP | 12 refills | Status: DC

## 2017-07-27 NOTE — Telephone Encounter (Signed)
Spoke with patient on the phone. He was advised to schedule an appt with Dr. Darnell Level in the next 2 weeks to discuss T2DM. Patient will need needles to go with sample of tresiba given to him to last the 2 weeks. These are currently on my desk for patient and will be picked up tomorrow (07/28/17)

## 2017-07-27 NOTE — Telephone Encounter (Signed)
Pt is requesting to speak with Tawanna Sat. Pt stated that Tawanna Sat gave pt a sample of insulin degludec (TRESIBA) 100 UNIT/ML SOPN FlexTouch Pen on Friday 07/22/17. Pt stated that his wife came to pick up another sample that they were advised to come pick up but when his wife arrived at the office she was advised that there wasn't information left and they didn't have anything listed for pt.   Pt stated that he is confused about his insulin and since Tawanna Sat helped pt last week when Dr. Rosanna Randy was out of the office he would like to discuss this with Tawanna Sat. I tried to get more details of what exactly the pt was needing and he requested that he speak with Tawanna Sat or a nurse. Please advise. Thanks TNP

## 2017-07-27 NOTE — Telephone Encounter (Signed)
Please Review.  Thanks,  -Aneshia Jacquet 

## 2017-08-02 ENCOUNTER — Other Ambulatory Visit: Payer: Self-pay

## 2017-08-08 ENCOUNTER — Ambulatory Visit (INDEPENDENT_AMBULATORY_CARE_PROVIDER_SITE_OTHER): Payer: 59 | Admitting: Family Medicine

## 2017-08-08 ENCOUNTER — Encounter: Payer: Self-pay | Admitting: Family Medicine

## 2017-08-08 VITALS — BP 122/60 | HR 70 | Temp 97.6°F | Resp 16 | Wt 268.0 lb

## 2017-08-08 DIAGNOSIS — E119 Type 2 diabetes mellitus without complications: Secondary | ICD-10-CM

## 2017-08-08 LAB — GLUCOSE, POCT (MANUAL RESULT ENTRY): POC Glucose: 197 mg/dl — AB (ref 70–99)

## 2017-08-08 MED ORDER — ONETOUCH VERIO W/DEVICE KIT: 1.0000 | PACK | Freq: Once | 0 refills | Status: AC

## 2017-08-08 MED ORDER — ONETOUCH VERIO W/DEVICE KIT: 1.0000 | PACK | Freq: Once | 0 refills | Status: DC

## 2017-08-08 MED ORDER — PEN NEEDLES 32G X 4 MM MISC: 1.0000 | Freq: Every day | 11 refills | Status: DC

## 2017-08-08 MED ORDER — ONETOUCH LANCETS MISC: 1.0000 | Freq: Two times a day (BID) | 11 refills | Status: DC

## 2017-08-08 MED ORDER — GLUCOSE BLOOD VI STRP: ORAL_STRIP | 12 refills | Status: DC

## 2017-08-08 MED ORDER — INSULIN DEGLUDEC 100 UNIT/ML ~~LOC~~ SOPN: 30.0000 [IU] | PEN_INJECTOR | Freq: Every day | SUBCUTANEOUS | 11 refills | Status: DC

## 2017-08-08 NOTE — Progress Notes (Signed)
Patient: Jeffery West Male    DOB: 20-Jun-1956   62 y.o.   MRN: [id number] Visit Date: 08/08/2017  Today's Provider: [name], MD   Chief Complaint  Patient presents with  . Diabetes   Subjective:    HPI  Diabetes Mellitus Type II, Follow-up:   Lab Results  Component Value Date   HGBA1C 9.9 05/23/2017   HGBA1C 7.6 (H) 07/26/2016   HGBA1C 7.9 02/09/2016    Last seen for diabetes 2 months ago.  Management since then includes saw Tawanna Sat on 07/22/17 Started Tresiba due to elevated Blood sugar being 316 fasting and having shoulder surgery, continuing metformin. He is taking 24 units every night. He reports good compliance with treatment. It is too early to do his A1c today.    Pertinent Labs:    Component Value Date/Time   CHOL 251 (H) 05/23/2017 1222   CHOL 197 07/26/2016 1507   TRIG 489 (H) 05/23/2017 1222   HDL 34 (L) 05/23/2017 1222   HDL 37 (L) 07/26/2016 1507   LDLCALC 89 07/26/2016 1507   CREATININE 0.88 07/19/2017 1351   CREATININE 0.77 05/23/2017 1222    Wt Readings from Last 3 Encounters:  08/08/17 268 lb (121.6 kg)  07/26/17 274 lb (124.3 kg)  07/22/17 274 lb (124.3 kg)    ------------------------------------------------------------------------    Allergies  Allergen Reactions  . Penicillins Hives, Swelling and Other (See Comments)    Has patient had a PCN reaction causing immediate rash, facial/tongue/throat swelling, SOB or lightheadedness with hypotension: Unknown Has patient had a PCN reaction causing severe rash involving mucus membranes or skin necrosis: No Has patient had a PCN reaction that required hospitalization: Yes Has patient had a PCN reaction occurring within the last 10 years: No If all of the above answers are "NO", then may proceed with Cephalosporin use.      Current Outpatient Medications:  .  acetaminophen (TYLENOL) 500 MG tablet, Take 1,000 mg by mouth every 6 (six) hours as needed for moderate pain or  headache., Disp: , Rfl:  .  albuterol (PROAIR HFA) 108 (90 BASE) MCG/ACT inhaler, Inhale 1 puff into the lungs every 6 (six) hours as needed for wheezing or shortness of breath. (Patient taking differently: Inhale 1-2 puffs into the lungs every 6 (six) hours as needed for wheezing or shortness of breath. ), Disp: 1 Inhaler, Rfl: 3 .  allopurinol (ZYLOPRIM) 300 MG tablet, TAKE 1 TABLET (300 MG TOTAL) BY MOUTH DAILY., Disp: 90 tablet, Rfl: 1 .  Blood Glucose Monitoring Suppl (ONETOUCH VERIO) w/Device KIT, 1 each by Does not apply route once for 1 dose., Disp: 1 kit, Rfl: 0 .  Carboxymethylcellulose Sodium (MOISTURIZING LUBRICANT EYE OP), Place 1 drop into both eyes daily as needed (for dry eyes)., Disp: , Rfl:  .  Cholecalciferol (VITAMIN D) 2000 units CAPS, Take 2,000 Units by mouth every evening. , Disp: , Rfl:  .  ezetimibe (ZETIA) 10 MG tablet, Take 1 tablet (10 mg total) by mouth daily., Disp: 90 tablet, Rfl: 3 .  glucose blood (ONETOUCH VERIO) test strip, Use as instructed; BID, Disp: 100 each, Rfl: 12 .  insulin degludec (TRESIBA) 100 UNIT/ML SOPN FlexTouch Pen, Inject 0.24 mLs (24 Units total) into the skin daily at 10 pm., Disp: 1 pen, Rfl: 0 .  losartan (COZAAR) 50 MG tablet, TAKE 1 TABLET BY MOUTH EVERY DAY, Disp: 30 tablet, Rfl: 6 .  magnesium oxide (MAG-OX) 400 MG tablet, Take 1 tablet (  400 mg total) by mouth 2 (two) times daily., Disp: 60 tablet, Rfl: 11 .  meloxicam (MOBIC) 15 MG tablet, Take 15 mg by mouth daily. , Disp: , Rfl:  .  metFORMIN (GLUCOPHAGE) 500 MG tablet, TAKE 1 TABLET (500 MG TOTAL) BY MOUTH 2 (TWO) TIMES DAILY WITH A MEAL., Disp: 60 tablet, Rfl: 11 .  ondansetron (ZOFRAN) 4 MG tablet, Take 1 tablet (4 mg total) by mouth every 8 (eight) hours as needed for nausea or vomiting., Disp: 30 tablet, Rfl: 0 .  ONE TOUCH LANCETS MISC, 1 Device by Does not apply route 2 (two) times daily., Disp: 60 each, Rfl: 11 .  oxyCODONE (OXY IR/ROXICODONE) 5 MG immediate release tablet, Take 1  tablet (5 mg total) by mouth every 4 (four) hours as needed for severe pain., Disp: 40 tablet, Rfl: 0  Review of Systems  Constitutional: Negative.   HENT: Negative.   Eyes: Negative.   Respiratory: Negative.   Cardiovascular: Negative.   Gastrointestinal: Negative.   Endocrine: Negative.   Genitourinary: Negative.   Musculoskeletal: Positive for arthralgias.  Skin: Negative.   Allergic/Immunologic: Negative.   Hematological: Negative.   Psychiatric/Behavioral: Negative.     Social History   Tobacco Use  . Smoking status: Former Smoker    Types: Cigars  . Smokeless tobacco: Never Used  . Tobacco comment: every once in a while, very rare.  Substance Use Topics  . Alcohol use: Yes    Alcohol/week: 0.0 oz    Comment: social   Objective:   BP 122/60 (BP Location: Left Arm, Patient Position: Sitting, Cuff Size: Large)   Pulse 70   Temp 97.6 F (36.4 C) (Oral)   Resp 16   Wt 268 lb (121.6 kg)   BMI 37.38 kg/m  Vitals:   08/08/17 1125  BP: 122/60  Pulse: 70  Resp: 16  Temp: 97.6 F (36.4 C)  TempSrc: Oral  Weight: 268 lb (121.6 kg)     Physical Exam  Constitutional: He is oriented to person, place, and time. He appears well-developed and well-nourished.  Obese WM NAD.  Eyes: Conjunctivae and EOM are normal. Pupils are equal, round, and reactive to light.  Neck: Normal range of motion. Neck supple.  Cardiovascular: Normal rate, regular rhythm, normal heart sounds and intact distal pulses.  Pulmonary/Chest: Effort normal and breath sounds normal.  Musculoskeletal: Normal range of motion.  Neurological: He is alert and oriented to person, place, and time. He has normal reflexes.  Skin: Skin is warm and dry.  Psychiatric: He has a normal mood and affect. His behavior is normal. Judgment and thought content normal.        Assessment & Plan:     1. UnControlled type 2 diabetes mellitus without complication, without long-term current use of insulin (HCC) Pt  noncompliant with any lifestyle changes. - ONE TOUCH LANCETS MISC; 1 Device by Does not apply route 2 (two) times daily.  Dispense: 60 each; Refill: 11 - glucose blood (ONETOUCH VERIO) test strip; Use as instructed; BID  Dispense: 100 each; Refill: 12 - Blood Glucose Monitoring Suppl (ONETOUCH VERIO) w/Device KIT; 1 each by Does not apply route once for 1 dose. E11.9  Dispense: 1 kit; Refill: 0 - Referral to Nutrition and Diabetes Services - POCT Glucose (CBG)  2. Type 2 diabetes mellitus without complication, without long-term current use of insulin (HCC)  - insulin degludec (TRESIBA) 100 UNIT/ML SOPN FlexTouch Pen; Inject 0.3 mLs (30 Units total) into the skin daily at 10 pm.  Dispense: 1 pen; Refill: 11 3.Morbid Obesity 4.Recent Shoulder Surgery    HPI, Exam, and A&P Transcribed under the direction and in the presence of Tavien Chestnut L. Cranford Mon, MD  Electronically Signed: Katina Dung, [place], MD  [facility]

## 2017-08-11 ENCOUNTER — Telehealth: Payer: Self-pay

## 2017-08-11 NOTE — Telephone Encounter (Signed)
Patient called saying that he was referred to the lifestyle clinic and he reports that he is not able to go because it is too expensive. He was under the impression that this would be covered th

## 2017-08-22 ENCOUNTER — Ambulatory Visit: Payer: Self-pay | Admitting: Family Medicine

## 2017-09-05 ENCOUNTER — Encounter: Payer: Self-pay | Admitting: Family Medicine

## 2017-09-05 ENCOUNTER — Ambulatory Visit (INDEPENDENT_AMBULATORY_CARE_PROVIDER_SITE_OTHER): Payer: 59 | Admitting: Family Medicine

## 2017-09-05 VITALS — BP 140/80 | HR 78 | Temp 97.9°F | Resp 16

## 2017-09-05 DIAGNOSIS — E119 Type 2 diabetes mellitus without complications: Secondary | ICD-10-CM | POA: Diagnosis not present

## 2017-09-05 DIAGNOSIS — E1165 Type 2 diabetes mellitus with hyperglycemia: Secondary | ICD-10-CM

## 2017-09-05 LAB — POCT GLYCOSYLATED HEMOGLOBIN (HGB A1C)
ESTIMATED AVERAGE GLUCOSE: 246
HEMOGLOBIN A1C: 10.2

## 2017-09-05 NOTE — Progress Notes (Signed)
Patient: Jeffery West Male    DOB: 1955-09-02   62 y.o.   MRN: [id number] Visit Date: 09/05/2017  Today's Provider: [name], MD   Chief Complaint  Patient presents with  . Follow-up    T2DM   Subjective:    HPI  Diabetes Mellitus Type II, Follow-up:   Lab Results  Component Value Date   HGBA1C 10.2 09/05/2017   HGBA1C 9.9 05/23/2017   HGBA1C 7.6 (H) 07/26/2016    Last seen for diabetes 1 weeks ago.  Management since then includes Tresiba continuing metformin. He is taking 24 units every night. He reports excellent compliance with treatment. He is not having side effects.  Current symptoms include none and have been stable. Home blood sugar records: not checking Patient reports that the lifestyle center was expensive for him. Reports that he does not have the kit to check his sugar levels.  Episodes of hypoglycemia? Not sure per patient.   Current Insulin Regimen: Tyler Aas Most Recent Eye Exam: Due this spring. Goes to [facility]. Weight trend: stable Prior visit with dietician: was referred to the [facility] Current diet: trying to eat healthy Current exercise: none Is doing stretching and some movement for his shoulder.  Pertinent Labs:    Component Value Date/Time   CHOL 251 (H) 05/23/2017 1222   CHOL 197 07/26/2016 1507   TRIG 489 (H) 05/23/2017 1222   HDL 34 (L) 05/23/2017 1222   HDL 37 (L) 07/26/2016 1507   LDLCALC  05/23/2017 1222     Comment:     . LDL cholesterol not calculated. Triglyceride levels greater than 400 mg/dL invalidate calculated LDL results. . Reference range: <100 . Desirable range <100 mg/dL for primary prevention;   <70 mg/dL for patients with CHD or diabetic patients  with > or = 2 CHD risk factors. Marland Kitchen LDL-C is now calculated using the Martin-Hopkins  calculation, which is a validated novel method providing  better accuracy than the Friedewald equation in the  estimation of LDL-C.  Cresenciano Genre et  al. Annamaria Helling. 0102;725(36): 2061-2068  (http://education.[facility].com/faq/FAQ164)    CREATININE 0.88 07/19/2017 1351   CREATININE 0.77 05/23/2017 1222    Wt Readings from Last 3 Encounters:  08/08/17 268 lb (121.6 kg)  07/26/17 274 lb (124.3 kg)  07/22/17 274 lb (124.3 kg)    ------------------------------------------------------------------------ Shoulder Surgery: He had his surgery July 26, 2017. Reports the therapist told him that he is having good progress with his right arm. Reports he goes BID a week. He is using Oxycodone PRN.     Allergies  Allergen Reactions  . Penicillins Hives, Swelling and Other (See Comments)    Has patient had a PCN reaction causing immediate rash, facial/tongue/throat swelling, SOB or lightheadedness with hypotension: Unknown Has patient had a PCN reaction causing severe rash involving mucus membranes or skin necrosis: No Has patient had a PCN reaction that required hospitalization: Yes Has patient had a PCN reaction occurring within the last 10 years: No If all of the above answers are "NO", then may proceed with Cephalosporin use.      Current Outpatient Medications:  .  acetaminophen (TYLENOL) 500 MG tablet, Take 1,000 mg by mouth every 6 (six) hours as needed for moderate pain or headache., Disp: , Rfl:  .  albuterol (PROAIR HFA) 108 (90 BASE) MCG/ACT inhaler, Inhale 1 puff into the lungs every 6 (six) hours as needed for wheezing or shortness of breath. (Patient taking differently: Inhale  1-2 puffs into the lungs every 6 (six) hours as needed for wheezing or shortness of breath. ), Disp: 1 Inhaler, Rfl: 3 .  allopurinol (ZYLOPRIM) 300 MG tablet, TAKE 1 TABLET (300 MG TOTAL) BY MOUTH DAILY., Disp: 90 tablet, Rfl: 1 .  Cholecalciferol (VITAMIN D) 2000 units CAPS, Take 2,000 Units by mouth every evening. , Disp: , Rfl:  .  ezetimibe (ZETIA) 10 MG tablet, Take 1 tablet (10 mg total) by mouth daily., Disp: 90 tablet, Rfl: 3 .  insulin  degludec (TRESIBA) 100 UNIT/ML SOPN FlexTouch Pen, Inject 0.3 mLs (30 Units total) into the skin daily at 10 pm., Disp: 1 pen, Rfl: 11 .  Insulin Pen Needle (PEN NEEDLES) 32G X 4 MM MISC, 1 each by Does not apply route daily., Disp: 100 each, Rfl: 11 .  losartan (COZAAR) 50 MG tablet, TAKE 1 TABLET BY MOUTH EVERY DAY, Disp: 30 tablet, Rfl: 6 .  magnesium oxide (MAG-OX) 400 MG tablet, Take 1 tablet (400 mg total) by mouth 2 (two) times daily., Disp: 60 tablet, Rfl: 11 .  meloxicam (MOBIC) 15 MG tablet, Take 15 mg by mouth daily. , Disp: , Rfl:  .  metFORMIN (GLUCOPHAGE) 500 MG tablet, TAKE 1 TABLET (500 MG TOTAL) BY MOUTH 2 (TWO) TIMES DAILY WITH A MEAL., Disp: 60 tablet, Rfl: 11 .  oxyCODONE (OXY IR/ROXICODONE) 5 MG immediate release tablet, Take 1 tablet (5 mg total) by mouth every 4 (four) hours as needed for severe pain., Disp: 40 tablet, Rfl: 0 .  Carboxymethylcellulose Sodium (MOISTURIZING LUBRICANT EYE OP), Place 1 drop into both eyes daily as needed (for dry eyes)., Disp: , Rfl:  .  glucose blood (ONETOUCH VERIO) test strip, Use as instructed; BID (Patient not taking: Reported on 09/05/2017), Disp: 100 each, Rfl: 12 .  ondansetron (ZOFRAN) 4 MG tablet, Take 1 tablet (4 mg total) by mouth every 8 (eight) hours as needed for nausea or vomiting. (Patient not taking: Reported on 09/05/2017), Disp: 30 tablet, Rfl: 0 .  ONE TOUCH LANCETS MISC, 1 Device by Does not apply route 2 (two) times daily. (Patient not taking: Reported on 09/05/2017), Disp: 60 each, Rfl: 11  Review of Systems  Constitutional: Negative.   HENT: Negative.   Eyes: Negative.  Negative for visual disturbance.  Cardiovascular: Negative for chest pain, palpitations and leg swelling.  Endocrine: Negative.   Allergic/Immunologic: Negative.   Neurological: Negative for dizziness, light-headedness, numbness and headaches.  Psychiatric/Behavioral: Negative.     Social History   Tobacco Use  . Smoking status: Former Smoker     Types: Cigars  . Smokeless tobacco: Never Used  . Tobacco comment: every once in a while, very rare.  Substance Use Topics  . Alcohol use: Yes    Alcohol/week: 0.0 oz    Comment: social   Objective:   BP 140/80 (BP Location: Left Arm, Patient Position: Sitting, Cuff Size: Normal)   Pulse 78   Temp 97.9 F (36.6 C) (Oral)   Resp 16    Physical Exam      Assessment & Plan:  1. UnControlled type 2 diabetes mellitus without complication, without long-term current use of insulin ([place]): -A1C went from 9.9 to 10.2. Pt not doing anything that has been instructed. He is told at least to check a FBS daily. He does not watch eating nor does he exercise  Continue Metformin 574m BID. - POCT glycosylated hemoglobin (Hb A1C) 2.Obesity 3.Recent Shoulder surgery    I have done the exam and reviewed  the chart and it is accurate to the best of my knowledge. Development worker, community has been used and  any errors in dictation or transcription are unintentional. Miguel Aschoff M.D. [place], MD  [facility]

## 2017-10-12 ENCOUNTER — Encounter: Payer: Self-pay | Admitting: Family Medicine

## 2017-10-12 ENCOUNTER — Ambulatory Visit (INDEPENDENT_AMBULATORY_CARE_PROVIDER_SITE_OTHER): Payer: 59 | Admitting: Family Medicine

## 2017-10-12 VITALS — BP 120/60 | HR 79 | Resp 79 | Ht 71.0 in | Wt 268.0 lb

## 2017-10-12 DIAGNOSIS — I10 Essential (primary) hypertension: Secondary | ICD-10-CM | POA: Diagnosis not present

## 2017-10-12 DIAGNOSIS — E1165 Type 2 diabetes mellitus with hyperglycemia: Secondary | ICD-10-CM | POA: Diagnosis not present

## 2017-10-12 DIAGNOSIS — I060 Rheumatic aortic stenosis: Secondary | ICD-10-CM | POA: Diagnosis not present

## 2017-10-12 NOTE — Progress Notes (Signed)
Patient: Jeffery West Male    DOB: 1955-11-29   62 y.o.   MRN: [id number] Visit Date: 10/12/2017  Today's Provider: [name], MD   Chief Complaint  Patient presents with  . Follow-up  . Diabetes   Subjective:    HPI   Diabetes Mellitus Type II, Follow-up:   Lab Results  Component Value Date   HGBA1C 10.2 09/05/2017   HGBA1C 9.9 05/23/2017   HGBA1C 7.6 (H) 07/26/2016   Last seen for diabetes 1 months ago.  Management since then includes; continue current medications. He reports good compliance with treatment. He is not having side effects. none Current symptoms include none and have been unchanged. Home blood sugar records: fasting range: 199-250  Episodes of hypoglycemia? no   Current Insulin Regimen: Tresbia 26 units qd Most Recent Eye Exam: 10/11/2016 Weight trend: stable Prior visit with dietician: no Current diet: well balanced Current exercise: none  ----------------------------------------------------------------   Allergies  Allergen Reactions  . Penicillins Hives, Swelling and Other (See Comments)    Has patient had a PCN reaction causing immediate rash, facial/tongue/throat swelling, SOB or lightheadedness with hypotension: Unknown Has patient had a PCN reaction causing severe rash involving mucus membranes or skin necrosis: No Has patient had a PCN reaction that required hospitalization: Yes Has patient had a PCN reaction occurring within the last 10 years: No If all of the above answers are "NO", then may proceed with Cephalosporin use.      Current Outpatient Medications:  .  acetaminophen (TYLENOL) 500 MG tablet, Take 1,000 mg by mouth every 6 (six) hours as needed for moderate pain or headache., Disp: , Rfl:  .  albuterol (PROAIR HFA) 108 (90 BASE) MCG/ACT inhaler, Inhale 1 puff into the lungs every 6 (six) hours as needed for wheezing or shortness of breath. (Patient taking differently: Inhale 1-2 puffs into the lungs every 6  (six) hours as needed for wheezing or shortness of breath. ), Disp: 1 Inhaler, Rfl: 3 .  allopurinol (ZYLOPRIM) 300 MG tablet, TAKE 1 TABLET (300 MG TOTAL) BY MOUTH DAILY., Disp: 90 tablet, Rfl: 1 .  Carboxymethylcellulose Sodium (MOISTURIZING LUBRICANT EYE OP), Place 1 drop into both eyes daily as needed (for dry eyes)., Disp: , Rfl:  .  Cholecalciferol (VITAMIN D) 2000 units CAPS, Take 2,000 Units by mouth every evening. , Disp: , Rfl:  .  ezetimibe (ZETIA) 10 MG tablet, Take 1 tablet (10 mg total) by mouth daily., Disp: 90 tablet, Rfl: 3 .  glucose blood (ONETOUCH VERIO) test strip, Use as instructed; BID, Disp: 100 each, Rfl: 12 .  insulin degludec (TRESIBA) 100 UNIT/ML SOPN FlexTouch Pen, Inject 0.3 mLs (30 Units total) into the skin daily at 10 pm., Disp: 1 pen, Rfl: 11 .  Insulin Pen Needle (PEN NEEDLES) 32G X 4 MM MISC, 1 each by Does not apply route daily., Disp: 100 each, Rfl: 11 .  losartan (COZAAR) 50 MG tablet, TAKE 1 TABLET BY MOUTH EVERY DAY, Disp: 30 tablet, Rfl: 6 .  magnesium oxide (MAG-OX) 400 MG tablet, Take 1 tablet (400 mg total) by mouth 2 (two) times daily., Disp: 60 tablet, Rfl: 11 .  meloxicam (MOBIC) 15 MG tablet, Take 15 mg by mouth daily. , Disp: , Rfl:  .  metFORMIN (GLUCOPHAGE) 500 MG tablet, TAKE 1 TABLET (500 MG TOTAL) BY MOUTH 2 (TWO) TIMES DAILY WITH A MEAL., Disp: 60 tablet, Rfl: 11 .  ondansetron (ZOFRAN) 4 MG tablet, Take 1  tablet (4 mg total) by mouth every 8 (eight) hours as needed for nausea or vomiting., Disp: 30 tablet, Rfl: 0 .  ONE TOUCH LANCETS MISC, 1 Device by Does not apply route 2 (two) times daily., Disp: 60 each, Rfl: 11 .  oxyCODONE (OXY IR/ROXICODONE) 5 MG immediate release tablet, Take 1 tablet (5 mg total) by mouth every 4 (four) hours as needed for severe pain., Disp: 40 tablet, Rfl: 0  Review of Systems  Constitutional: Negative for appetite change, chills and fever.  Eyes: Negative.   Respiratory: Negative for chest tightness, shortness of  breath and wheezing.   Cardiovascular: Negative for chest pain and palpitations.  Gastrointestinal: Negative for abdominal pain, nausea and vomiting.  Endocrine: Negative.   Genitourinary: Negative.   Allergic/Immunologic: Negative.   Neurological: Negative.   Psychiatric/Behavioral: Negative.     Social History   Tobacco Use  . Smoking status: Former Smoker    Types: Cigars  . Smokeless tobacco: Never Used  . Tobacco comment: every once in a while, very rare.  Substance Use Topics  . Alcohol use: Yes    Alcohol/week: 0.0 oz    Comment: social   Objective:   BP 120/60 (BP Location: Left Arm, Patient Position: Sitting, Cuff Size: Large)   Pulse 79   Resp (!) 79   Ht 5\' 11"  (1.803 m)   Wt 268 lb (121.6 kg)   SpO2 94%   BMI 37.38 kg/m  Vitals:   10/12/17 1628  BP: 120/60  Pulse: 79  Resp: (!) 79  SpO2: 94%  Weight: 268 lb (121.6 kg)  Height: 5\' 11"  (1.803 m)     Physical Exam  Constitutional: He is oriented to person, place, and time. He appears well-developed and well-nourished.  HENT:  Head: Normocephalic and atraumatic.  Eyes: No scleral icterus.  Neck: No thyromegaly present.  Cardiovascular: Normal rate and regular rhythm.  Murmur heard. 3/6 systolic murmur.  Pulmonary/Chest: Effort normal and breath sounds normal.  Abdominal: Soft.  Musculoskeletal: He exhibits no edema.  Lymphadenopathy:    He has no cervical adenopathy.  Neurological: He is alert and oriented to person, place, and time.  Skin: Skin is warm and dry.  Psychiatric: He has a normal mood and affect. His behavior is normal. Judgment and thought content normal.        Assessment & Plan:     TIIDM Poor control. Increase Insulin to 30 units daily.RTC 2-3 months. Obesity Noncompliance with Lifestyle changes. Recent Shoulder surgery     I have done the exam and reviewed the chart and it is accurate to the best of my knowledge. Development worker, community has been used and  any errors in  dictation or transcription are unintentional. Miguel Aschoff M.D. [place], MD  [facility]

## 2017-10-12 NOTE — Patient Instructions (Addendum)
1. UnControlled type 2 diabetes mellitus without complication, without long-term current use of insulin ([place]): Increase Tresbia to 30 units daily, samples given to patient  Return for follow up in 2-3 months

## 2017-10-20 ENCOUNTER — Other Ambulatory Visit: Payer: Self-pay | Admitting: Physician Assistant

## 2017-10-21 NOTE — Telephone Encounter (Signed)
Pharmacy requesting refills. Thanks!  

## 2017-10-25 LAB — HM DIABETES EYE EXAM

## 2017-12-12 ENCOUNTER — Ambulatory Visit: Payer: Self-pay | Admitting: Family Medicine

## 2017-12-13 ENCOUNTER — Ambulatory Visit: Payer: 59 | Admitting: Family Medicine

## 2017-12-13 ENCOUNTER — Encounter: Payer: Self-pay | Admitting: Family Medicine

## 2017-12-13 VITALS — BP 122/76 | HR 70 | Temp 98.4°F | Resp 16 | Ht 71.0 in | Wt 263.0 lb

## 2017-12-13 DIAGNOSIS — I10 Essential (primary) hypertension: Secondary | ICD-10-CM

## 2017-12-13 DIAGNOSIS — I060 Rheumatic aortic stenosis: Secondary | ICD-10-CM

## 2017-12-13 DIAGNOSIS — E1165 Type 2 diabetes mellitus with hyperglycemia: Secondary | ICD-10-CM | POA: Diagnosis not present

## 2017-12-13 LAB — POCT GLYCOSYLATED HEMOGLOBIN (HGB A1C)
Est. average glucose Bld gHb Est-mCnc: 192
HEMOGLOBIN A1C: 8.3 % — AB (ref 4.0–5.6)

## 2017-12-13 NOTE — Progress Notes (Signed)
Patient: Jeffery West Male    DOB: 02/19/1956   62 y.o.   MRN: [id number] Visit Date: 12/13/2017  Today's Provider: [name], MD   Chief Complaint  Patient presents with  . Diabetes   Subjective:    HPI  Diabetes Mellitus Type II, Follow-up:   Lab Results  Component Value Date   HGBA1C 8.3 (A) 12/13/2017   HGBA1C 10.2 09/05/2017   HGBA1C 9.9 05/23/2017    Last seen for diabetes 2 months ago.  Management since then includes increasing insulin to 30 units daily. He reports good compliance with treatment. He is not having side effects.  Current symptoms include none and have been stable. Home blood sugar records: trend: stable  Episodes of hypoglycemia? no   Current Insulin Regimen: 30 units daily Most Recent Eye Exam: up to date Weight trend: stable Prior visit with dietician: no Current diet: well balanced Current exercise: no regular exercise  Pertinent Labs:    Component Value Date/Time   CHOL 251 (H) 05/23/2017 1222   CHOL 197 07/26/2016 1507   TRIG 489 (H) 05/23/2017 1222   HDL 34 (L) 05/23/2017 1222   HDL 37 (L) 07/26/2016 1507   [place]  05/23/2017 1222     Comment:     . LDL cholesterol not calculated. Triglyceride levels greater than 400 mg/dL invalidate calculated LDL results. . Reference range: <100 . Desirable range <100 mg/dL for primary prevention;   <70 mg/dL for patients with CHD or diabetic patients  with > or = 2 CHD risk factors. Marland Kitchen LDL-C is now calculated using the Martin-Hopkins  calculation, which is a validated novel method providing  better accuracy than the Friedewald equation in the  estimation of LDL-C.  Cresenciano Genre et al. Annamaria Helling. 8250;539(76): 2061-2068  (http://education.[facility].com/faq/FAQ164)    CREATININE 0.88 07/19/2017 1351   CREATININE 0.77 05/23/2017 1222    Wt Readings from Last 3 Encounters:  12/13/17 263 lb (119.3 kg)  10/12/17 268 lb (121.6 kg)  08/08/17 268 lb (121.6 kg)      Allergies  Allergen Reactions  . Penicillins Hives, Swelling and Other (See Comments)    Has patient had a PCN reaction causing immediate rash, facial/tongue/throat swelling, SOB or lightheadedness with hypotension: Unknown Has patient had a PCN reaction causing severe rash involving mucus membranes or skin necrosis: No Has patient had a PCN reaction that required hospitalization: Yes Has patient had a PCN reaction occurring within the last 10 years: No If all of the above answers are "NO", then may proceed with Cephalosporin use.      Current Outpatient Medications:  .  acetaminophen (TYLENOL) 500 MG tablet, Take 1,000 mg by mouth every 6 (six) hours as needed for moderate pain or headache., Disp: , Rfl:  .  albuterol (PROAIR HFA) 108 (90 BASE) MCG/ACT inhaler, Inhale 1 puff into the lungs every 6 (six) hours as needed for wheezing or shortness of breath. (Patient taking differently: Inhale 1-2 puffs into the lungs every 6 (six) hours as needed for wheezing or shortness of breath. ), Disp: 1 Inhaler, Rfl: 3 .  allopurinol (ZYLOPRIM) 300 MG tablet, TAKE 1 TABLET (300 MG TOTAL) BY MOUTH DAILY., Disp: 90 tablet, Rfl: 3 .  Carboxymethylcellulose Sodium (MOISTURIZING LUBRICANT EYE OP), Place 1 drop into both eyes daily as needed (for dry eyes)., Disp: , Rfl:  .  Cholecalciferol (VITAMIN D) 2000 units CAPS, Take 2,000 Units by mouth every evening. , Disp: , Rfl:  .  ezetimibe (ZETIA) 10 MG tablet, Take 1 tablet (10 mg total) by mouth daily., Disp: 90 tablet, Rfl: 3 .  glucose blood (ONETOUCH VERIO) test strip, Use as instructed; BID, Disp: 100 each, Rfl: 12 .  insulin degludec (TRESIBA) 100 UNIT/ML SOPN FlexTouch Pen, Inject 0.3 mLs (30 Units total) into the skin daily at 10 pm., Disp: 1 pen, Rfl: 11 .  Insulin Pen Needle (PEN NEEDLES) 32G X 4 MM MISC, 1 each by Does not apply route daily., Disp: 100 each, Rfl: 11 .  losartan (COZAAR) 50 MG tablet, TAKE 1 TABLET BY MOUTH EVERY DAY, Disp: 30  tablet, Rfl: 6 .  magnesium oxide (MAG-OX) 400 MG tablet, Take 1 tablet (400 mg total) by mouth 2 (two) times daily., Disp: 60 tablet, Rfl: 11 .  meloxicam (MOBIC) 15 MG tablet, Take 15 mg by mouth daily. , Disp: , Rfl:  .  metFORMIN (GLUCOPHAGE) 500 MG tablet, TAKE 1 TABLET (500 MG TOTAL) BY MOUTH 2 (TWO) TIMES DAILY WITH A MEAL., Disp: 60 tablet, Rfl: 11 .  ondansetron (ZOFRAN) 4 MG tablet, Take 1 tablet (4 mg total) by mouth every 8 (eight) hours as needed for nausea or vomiting., Disp: 30 tablet, Rfl: 0 .  ONE TOUCH LANCETS MISC, 1 Device by Does not apply route 2 (two) times daily., Disp: 60 each, Rfl: 11 .  oxyCODONE (OXY IR/ROXICODONE) 5 MG immediate release tablet, Take 1 tablet (5 mg total) by mouth every 4 (four) hours as needed for severe pain., Disp: 40 tablet, Rfl: 0  Review of Systems  Constitutional: Negative for activity change, appetite change, diaphoresis and fatigue.  HENT: Negative.   Eyes: Negative.   Respiratory: Negative for cough.   Cardiovascular: Negative for chest pain, palpitations and leg swelling.  Gastrointestinal: Negative.   Endocrine: Negative for cold intolerance, heat intolerance, polydipsia, polyphagia and polyuria.  Musculoskeletal: Positive for arthralgias.  Allergic/Immunologic: Negative.   Neurological: Negative for dizziness, weakness, light-headedness and headaches.  Psychiatric/Behavioral: Negative.     Social History   Tobacco Use  . Smoking status: Former Smoker    Types: Cigars  . Smokeless tobacco: Never Used  . Tobacco comment: every once in a while, very rare.  Substance Use Topics  . Alcohol use: Yes    Alcohol/week: 0.0 oz    Comment: social   Objective:   BP 122/76 (BP Location: Left Arm, Patient Position: Sitting, Cuff Size: Large)   Pulse 70   Temp 98.4 F (36.9 C)   Resp 16   Ht 5\' 11"  (1.803 m)   Wt 263 lb (119.3 kg)   SpO2 97%   BMI 36.68 kg/m  Vitals:   12/13/17 1328  BP: 122/76  Pulse: 70  Resp: 16  Temp:  98.4 F (36.9 C)  SpO2: 97%  Weight: 263 lb (119.3 kg)  Height: 5\' 11"  (1.803 m)     Physical Exam  Constitutional: He is oriented to person, place, and time. He appears well-developed and well-nourished.  HENT:  Head: Normocephalic and atraumatic.  Right Ear: External ear normal.  Left Ear: External ear normal.  Nose: Nose normal.  Eyes: Conjunctivae are normal. No scleral icterus.  Neck: No thyromegaly present.  Cardiovascular: Normal rate, regular rhythm and normal heart sounds.  Pulmonary/Chest: Effort normal and breath sounds normal.  Abdominal: Soft.  Musculoskeletal: He exhibits no edema.  Neurological: He is alert and oriented to person, place, and time.  Skin: Skin is warm and dry.  Psychiatric: He has a normal mood and  affect. His behavior is normal. Judgment and thought content normal.        Assessment & Plan:     1. Poorly controlled type 2 diabetes mellitus (HCC) Improved from 10.2 to 8.3.Lifestyle stressed.RTC 4 months. - POCT glycosylated hemoglobin (Hb A1C) 2.Morbid Obesity 3.HTN 4.HLD 5.Aortic valve stenosis Per Dr Rockey Situ     I have done the exam and reviewed the above chart and it is accurate to the best of my knowledge. Development worker, community has been used in this note in any air is in the dictation or transcription are unintentional.  [name], MD  [place]

## 2018-02-19 ENCOUNTER — Other Ambulatory Visit: Payer: Self-pay | Admitting: Family Medicine

## 2018-02-19 DIAGNOSIS — I10 Essential (primary) hypertension: Secondary | ICD-10-CM

## 2018-02-20 ENCOUNTER — Other Ambulatory Visit: Payer: Self-pay | Admitting: Family Medicine

## 2018-02-20 DIAGNOSIS — E119 Type 2 diabetes mellitus without complications: Secondary | ICD-10-CM

## 2018-02-20 DIAGNOSIS — R252 Cramp and spasm: Secondary | ICD-10-CM

## 2018-03-25 ENCOUNTER — Other Ambulatory Visit: Payer: Self-pay | Admitting: Family Medicine

## 2018-03-25 DIAGNOSIS — I10 Essential (primary) hypertension: Secondary | ICD-10-CM

## 2018-03-29 MED ORDER — TELMISARTAN 40 MG PO TABS: ORAL_TABLET | ORAL | 11 refills | Status: DC

## 2018-03-29 NOTE — Addendum Note (Signed)
Addended by: Althea Charon D on: 03/29/2018 12:14 PM   Modules accepted: Orders

## 2018-04-11 ENCOUNTER — Encounter: Payer: Self-pay | Admitting: Cardiovascular Disease

## 2018-04-18 ENCOUNTER — Ambulatory Visit (INDEPENDENT_AMBULATORY_CARE_PROVIDER_SITE_OTHER): Payer: 59 | Admitting: Family Medicine

## 2018-04-18 ENCOUNTER — Encounter: Payer: Self-pay | Admitting: Family Medicine

## 2018-04-18 ENCOUNTER — Ambulatory Visit: Payer: Self-pay | Admitting: Family Medicine

## 2018-04-18 VITALS — BP 112/64 | HR 72 | Temp 98.7°F | Resp 16 | Wt 267.0 lb

## 2018-04-18 DIAGNOSIS — E782 Mixed hyperlipidemia: Secondary | ICD-10-CM | POA: Diagnosis not present

## 2018-04-18 DIAGNOSIS — I10 Essential (primary) hypertension: Secondary | ICD-10-CM | POA: Diagnosis not present

## 2018-04-18 DIAGNOSIS — Z23 Encounter for immunization: Secondary | ICD-10-CM | POA: Diagnosis not present

## 2018-04-18 DIAGNOSIS — E1165 Type 2 diabetes mellitus with hyperglycemia: Secondary | ICD-10-CM

## 2018-04-18 LAB — POCT GLYCOSYLATED HEMOGLOBIN (HGB A1C): HEMOGLOBIN A1C: 8.3 % — AB (ref 4.0–5.6)

## 2018-04-18 LAB — POCT UA - MICROALBUMIN: MICROALBUMIN (UR) POC: 50 mg/L

## 2018-04-18 NOTE — Progress Notes (Signed)
Patient: Jeffery West Male    DOB: May 30, 1956   62 y.o.   MRN: [id number] Visit Date: 04/18/2018  Today's Provider: [name], MD   Chief Complaint  Patient presents with  . Hyperlipidemia  . Hypertension  . Diabetes   Subjective:    Hyperlipidemia  Pertinent negatives include no chest pain, myalgias or shortness of breath.  Diabetes  He presents for his follow-up diabetic visit. He has type 2 diabetes mellitus. His disease course has been stable. Pertinent negatives for hypoglycemia include no dizziness or headaches. There are no diabetic associated symptoms. Pertinent negatives for diabetes include no blurred vision and no chest pain. Symptoms are stable. He is following a diabetic and generally healthy diet. (Fasting Blood sugars mid to high 100's. ) He does not see a podiatrist.Eye exam is current.  Hypertension  This is a chronic problem. The problem is unchanged. The problem is controlled. Pertinent negatives include no blurred vision, chest pain, headaches, malaise/fatigue, neck pain, peripheral edema or shortness of breath. There are no associated agents to hypertension. There are no compliance problems.    Lab Results  Component Value Date   HGBA1C 8.3 (A) 12/13/2017   Lab Results  Component Value Date   CHOL 251 (H) 05/23/2017   CHOL 197 07/26/2016   CHOL 188 06/04/2015   Lab Results  Component Value Date   HDL 34 (L) 05/23/2017   HDL 37 (L) 07/26/2016   HDL 34 (L) 06/04/2015   Lab Results  Component Value Date   [facility]  05/23/2017     Comment:     . LDL cholesterol not calculated. Triglyceride levels greater than 400 mg/dL invalidate calculated LDL results. . Reference range: <100 . Desirable range <100 mg/dL for primary prevention;   <70 mg/dL for patients with CHD or diabetic patients  with > or = 2 CHD risk factors. Marland Kitchen LDL-C is now calculated using the Martin-Hopkins  calculation, which is a validated novel method providing    better accuracy than the Friedewald equation in the  estimation of LDL-C.  Cresenciano Genre et al. Annamaria Helling. 7628;315(17): 2061-2068  (http://education.[employer].com/faq/FAQ164)    LDLCALC 89 07/26/2016   LDLCALC 95 06/04/2015   Lab Results  Component Value Date   TRIG 489 (H) 05/23/2017   TRIG 353 (H) 07/26/2016   TRIG 293 (H) 06/04/2015   Lab Results  Component Value Date   CHOLHDL 7.4 (H) 05/23/2017   CHOLHDL 5.5 (H) 06/04/2015   No results found for: LDLDIRECT BP Readings from Last 3 Encounters:  04/18/18 112/64  12/13/17 122/76  10/12/17 120/60   Wt Readings from Last 3 Encounters:  04/18/18 267 lb (121.1 kg)  12/13/17 263 lb (119.3 kg)  10/12/17 268 lb (121.6 kg)       Allergies  Allergen Reactions  . Penicillins Hives, Swelling and Other (See Comments)    Has patient had a PCN reaction causing immediate rash, facial/tongue/throat swelling, SOB or lightheadedness with hypotension: Unknown Has patient had a PCN reaction causing severe rash involving mucus membranes or skin necrosis: No Has patient had a PCN reaction that required hospitalization: Yes Has patient had a PCN reaction occurring within the last 10 years: No If all of the above answers are "NO", then may proceed with Cephalosporin use.      Current Outpatient Medications:  .  acetaminophen (TYLENOL) 500 MG tablet, Take 1,000 mg by mouth every 6 (six) hours as needed for moderate pain or headache., Disp: ,  Rfl:  .  albuterol (PROAIR HFA) 108 (90 BASE) MCG/ACT inhaler, Inhale 1 puff into the lungs every 6 (six) hours as needed for wheezing or shortness of breath. (Patient taking differently: Inhale 1-2 puffs into the lungs every 6 (six) hours as needed for wheezing or shortness of breath. ), Disp: 1 Inhaler, Rfl: 3 .  allopurinol (ZYLOPRIM) 300 MG tablet, TAKE 1 TABLET (300 MG TOTAL) BY MOUTH DAILY., Disp: 90 tablet, Rfl: 3 .  Carboxymethylcellulose Sodium (MOISTURIZING LUBRICANT EYE OP), Place 1 drop into  both eyes daily as needed (for dry eyes)., Disp: , Rfl:  .  Cholecalciferol (VITAMIN D) 2000 units CAPS, Take 2,000 Units by mouth every evening. , Disp: , Rfl:  .  ezetimibe (ZETIA) 10 MG tablet, Take 1 tablet (10 mg total) by mouth daily., Disp: 90 tablet, Rfl: 3 .  glucose blood (ONETOUCH VERIO) test strip, Use as instructed; BID, Disp: 100 each, Rfl: 12 .  insulin degludec (TRESIBA) 100 UNIT/ML SOPN FlexTouch Pen, Inject 0.3 mLs (30 Units total) into the skin daily at 10 pm., Disp: 1 pen, Rfl: 11 .  Insulin Pen Needle (PEN NEEDLES) 32G X 4 MM MISC, 1 each by Does not apply route daily., Disp: 100 each, Rfl: 11 .  magnesium oxide (MAG-OX) 400 (241.3 Mg) MG tablet, TAKE 1 TABLET (400 MG TOTAL) BY MOUTH 2 (TWO) TIMES DAILY., Disp: 60 tablet, Rfl: 11 .  meloxicam (MOBIC) 15 MG tablet, Take 15 mg by mouth daily. , Disp: , Rfl:  .  metFORMIN (GLUCOPHAGE) 500 MG tablet, TAKE 1 TABLET BY MOUTH TWICE A DAY WITH MEALS, Disp: 60 tablet, Rfl: 11 .  ondansetron (ZOFRAN) 4 MG tablet, Take 1 tablet (4 mg total) by mouth every 8 (eight) hours as needed for nausea or vomiting., Disp: 30 tablet, Rfl: 0 .  ONE TOUCH LANCETS MISC, 1 Device by Does not apply route 2 (two) times daily., Disp: 60 each, Rfl: 11 .  oxyCODONE (OXY IR/ROXICODONE) 5 MG immediate release tablet, Take 1 tablet (5 mg total) by mouth every 4 (four) hours as needed for severe pain., Disp: 40 tablet, Rfl: 0 .  telmisartan (MICARDIS) 40 MG tablet, Please specify directions, refills and quantity, Disp: 30 tablet, Rfl: 11  Review of Systems  Constitutional: Negative.  Negative for malaise/fatigue.  Eyes: Negative for blurred vision.  Respiratory: Negative.  Negative for shortness of breath.   Cardiovascular: Negative.  Negative for chest pain.  Gastrointestinal: Negative.   Endocrine: Negative.   Musculoskeletal: Positive for arthralgias. Negative for back pain, gait problem, joint swelling, myalgias, neck pain and neck stiffness.    Allergic/Immunologic: Negative.   Neurological: Negative for dizziness, light-headedness and headaches.  Psychiatric/Behavioral: Negative.     Social History   Tobacco Use  . Smoking status: Former Smoker    Types: Cigars  . Smokeless tobacco: Never Used  . Tobacco comment: every once in a while, very rare.  Substance Use Topics  . Alcohol use: Yes    Alcohol/week: 0.0 standard drinks    Comment: social   Objective:   BP 112/64 (BP Location: Left Arm, Patient Position: Sitting, Cuff Size: Large)   Pulse 72   Temp 98.7 F (37.1 C) (Oral)   Resp 16   Wt 267 lb (121.1 kg)   BMI 37.24 kg/m  Vitals:   04/18/18 1601  BP: 112/64  Pulse: 72  Resp: 16  Temp: 98.7 F (37.1 C)  TempSrc: Oral  Weight: 267 lb (121.1 kg)   Results for  orders placed or performed in visit on 04/18/18  POCT glycosylated hemoglobin (Hb A1C)  Result Value Ref Range   Hemoglobin A1C 8.3 (A) 4.0 - 5.6 %  POCT UA - Microalbumin  Result Value Ref Range   Microalbumin Ur, POC 50 mg/L      Physical Exam  Constitutional: He is oriented to person, place, and time. He appears well-developed and well-nourished.  HENT:  Head: Normocephalic and atraumatic.  Right Ear: External ear normal.  Left Ear: External ear normal.  Nose: Nose normal.  Mouth/Throat: Oropharynx is clear and moist.  Eyes: Conjunctivae are normal.  Cardiovascular: Normal rate and regular rhythm.  Pulmonary/Chest: Effort normal and breath sounds normal.  Abdominal: Soft.  Musculoskeletal: He exhibits no edema.  Neurological: He is alert and oriented to person, place, and time.  Skin: Skin is warm and dry.  Psychiatric: He has a normal mood and affect. His behavior is normal. Judgment and thought content normal.        Assessment & Plan:     1. Essential hypertension  - CBC with Differential/Platelet - TSH - Comprehensive metabolic panel  2. Poorly controlled type 2 diabetes mellitus ([place]) Stressed the need to work on  lifestyle. Pt noncompliant with this.RTC 3-4 months. - CBC with Differential/Platelet - TSH - Comprehensive metabolic panel - POCT glycosylated hemoglobin (Hb A1C)--8.3  - POCT UA - Microalbumin  3. Mixed hyperlipidemia  - CBC with Differential/Platelet - TSH - Comprehensive metabolic panel - Lipid panel  4. Need for influenza vaccination  - Flu Vaccine QUAD 6+ mos PF IM (Fluarix Quad PF) 5.Morbid Obesity     I have done the exam and reviewed the above chart and it is accurate to the best of my knowledge. Development worker, community has been used in this note in any air is in the dictation or transcription are unintentional.  [name], MD  [place]

## 2018-06-19 ENCOUNTER — Other Ambulatory Visit: Payer: Self-pay | Admitting: Family Medicine

## 2018-06-19 DIAGNOSIS — I10 Essential (primary) hypertension: Secondary | ICD-10-CM

## 2018-06-26 ENCOUNTER — Other Ambulatory Visit: Payer: Self-pay | Admitting: Family Medicine

## 2018-06-26 MED ORDER — LOSARTAN POTASSIUM 25 MG PO TABS: 50.0000 mg | ORAL_TABLET | Freq: Every day | ORAL | 0 refills | Status: DC

## 2018-06-26 NOTE — Telephone Encounter (Signed)
Rx as requested sent in for 1 month

## 2018-06-26 NOTE — Telephone Encounter (Signed)
Pt's Rx is on back order:  losartan (COZAAR) 50 MG tablet Pharmacy says the 50 mg is on back order.  Pharmacy asking if pt's Rx can be rewritten for 25 mg - take 2 one time a day so pt's Rx can be filled.  Pt is almost out of Rx.  Please advise.  Thanks, [name]

## 2018-07-11 ENCOUNTER — Other Ambulatory Visit: Payer: Self-pay | Admitting: Family Medicine

## 2018-07-11 DIAGNOSIS — E785 Hyperlipidemia, unspecified: Secondary | ICD-10-CM

## 2018-08-08 ENCOUNTER — Encounter: Payer: Self-pay | Admitting: Family Medicine

## 2018-08-08 ENCOUNTER — Ambulatory Visit (INDEPENDENT_AMBULATORY_CARE_PROVIDER_SITE_OTHER): Payer: 59 | Admitting: Family Medicine

## 2018-08-08 VITALS — BP 146/74 | HR 71 | Temp 98.1°F | Resp 16 | Wt 271.8 lb

## 2018-08-08 DIAGNOSIS — J0110 Acute frontal sinusitis, unspecified: Secondary | ICD-10-CM

## 2018-08-08 MED ORDER — DOXYCYCLINE HYCLATE 100 MG PO TABS: 100.0000 mg | ORAL_TABLET | Freq: Two times a day (BID) | ORAL | 0 refills | Status: DC

## 2018-08-08 NOTE — Progress Notes (Signed)
  Subjective:     Patient ID: Jeffery West, male   DOB: 1955-09-02, 63 y.o.   MRN: [id number] Chief Complaint  Patient presents with  . Nasal Congestion    Pt reports to the office for nasal congestion, cough, wheezing and sinus pressure x1 week. Pt reports over the last 5 days symptoms have gotten worse. Pt reports he has a productive cough with brownish sputum. Pt has tried OTC allergy medication and Alka-selzer cold . Pt also has been using an inhaler that he was prescribed for the wheezing.    HPI Patient reports increased sinus pressure, purulent sinus drainage, post nasal drainage and accompanying cough  Review of Systems     Objective:   Physical Exam Constitutional:      General: He is not in acute distress. Neurological:     Mental Status: He is alert.   Ears: T.M's intact without inflammation Sinuses: mild frontal sinus tenderness Throat: no tonsillar enlargement or exudate Neck: no cervical adenopathy Lungs: clear     Assessment:    1. Acute non-recurrent frontal sinusitis: doxycycline    Plan:    Discussed use of saline nasal irrigation along with Robitussin DM. May add Benadryl at night for PND. Avoid decongestants due to bp elevation.

## 2018-08-08 NOTE — Patient Instructions (Addendum)
Use salt water nasal spray for congestion along with the Robitussin DM. Add Benadryl at night to help you sleep and dry up post nasal drainage.

## 2018-08-10 ENCOUNTER — Encounter: Payer: 59 | Admitting: Family Medicine

## 2018-08-14 ENCOUNTER — Telehealth: Payer: Self-pay | Admitting: Family Medicine

## 2018-08-14 DIAGNOSIS — E119 Type 2 diabetes mellitus without complications: Secondary | ICD-10-CM

## 2018-08-14 NOTE — Telephone Encounter (Signed)
Pt called needing a refill on Tresiba 100 unit flex touch pens. The pharmacy told him he would have to come in to see Dr. Rosanna Randy before they can refill.  Pt said he has 3 doses left and cannot come in until he checks with work to find out when he can get off  He said they have changed their attendance policy and it is hard to get off..  Can we refill enough to get him through until he can get in  [facility]  Pt's CB# [phone]  Thanks Con [employer]

## 2018-08-15 MED ORDER — INSULIN DEGLUDEC 100 UNIT/ML ~~LOC~~ SOPN: 30.0000 [IU] | PEN_INJECTOR | Freq: Every day | SUBCUTANEOUS | 11 refills | Status: DC

## 2018-08-15 NOTE — Telephone Encounter (Signed)
Pt's wife Therisa Doyne went by to pick up the insulin degludec (TRESIBA) 100 UNIT/ML SOPN FlexTouch Pen.  It hasn't been called in.  Wife can be reached at [phone].  Please advise.  Thanks, [name]

## 2018-08-15 NOTE — Telephone Encounter (Signed)
Done  Ed

## 2018-08-15 NOTE — Telephone Encounter (Signed)
Patient advised.

## 2018-08-15 NOTE — Telephone Encounter (Signed)
This was done on 08/08/18 with refills

## 2018-08-21 ENCOUNTER — Telehealth: Payer: Self-pay | Admitting: Family Medicine

## 2018-08-21 DIAGNOSIS — E119 Type 2 diabetes mellitus without complications: Secondary | ICD-10-CM

## 2018-08-21 MED ORDER — INSULIN DEGLUDEC 100 UNIT/ML ~~LOC~~ SOPN: 30.0000 [IU] | PEN_INJECTOR | Freq: Every day | SUBCUTANEOUS | 11 refills | Status: DC

## 2018-08-21 NOTE — Telephone Encounter (Signed)
Letter from [employer] regarding: insulin degludec (TRESIBA) 100 UNIT/ML SOPN FlexTouch Pen All future  Refills will need to be 90 day refills starting March 24th.  He picked up a refill on Feb 28 that had only 3 pens usually 5 pens. He doesn't know if this was due to the 90 day refills starting March 24th.  Nothing needing to be done.  Just letting Dr. Rosanna Randy know of the letter.  Thanks, [name]

## 2018-09-25 ENCOUNTER — Other Ambulatory Visit: Payer: Self-pay | Admitting: Family Medicine

## 2018-09-25 DIAGNOSIS — E119 Type 2 diabetes mellitus without complications: Secondary | ICD-10-CM

## 2018-09-28 ENCOUNTER — Other Ambulatory Visit: Payer: Self-pay | Admitting: Family Medicine

## 2018-10-22 ENCOUNTER — Other Ambulatory Visit: Payer: Self-pay | Admitting: Family Medicine

## 2018-10-23 NOTE — Telephone Encounter (Signed)
Please Review

## 2018-11-30 ENCOUNTER — Other Ambulatory Visit: Payer: Self-pay | Admitting: Family Medicine

## 2018-11-30 DIAGNOSIS — E119 Type 2 diabetes mellitus without complications: Secondary | ICD-10-CM

## 2019-01-17 ENCOUNTER — Other Ambulatory Visit: Payer: Self-pay | Admitting: Family Medicine

## 2019-01-17 DIAGNOSIS — E119 Type 2 diabetes mellitus without complications: Secondary | ICD-10-CM

## 2019-01-17 DIAGNOSIS — R252 Cramp and spasm: Secondary | ICD-10-CM

## 2019-01-22 ENCOUNTER — Other Ambulatory Visit: Payer: Self-pay | Admitting: Family Medicine

## 2019-01-22 DIAGNOSIS — E119 Type 2 diabetes mellitus without complications: Secondary | ICD-10-CM

## 2019-01-22 NOTE — Telephone Encounter (Signed)
Spoke to patient's spouse regarding scheduling a follow up appointment for diabetes. Jeffery West

## 2019-04-01 ENCOUNTER — Other Ambulatory Visit: Payer: Self-pay | Admitting: Family Medicine

## 2019-04-01 DIAGNOSIS — I10 Essential (primary) hypertension: Secondary | ICD-10-CM

## 2019-07-12 NOTE — Progress Notes (Signed)
Patient: Jeffery West, Male    DOB: 05-Apr-1956, 64 y.o.   MRN: [id number] Visit Date: 07/16/2019  Today's Provider: [name], MD   Chief Complaint  Patient presents with  . Annual Exam   Subjective:     Annual physical exam DONLEY BIERMANN is a 64 y.o. male who presents today for health maintenance and complete physical. He feels fairly well. He reports no exercising, other than what he does at work. He reports he is sleeping fairly well. Patient's wife has retired.  He continues to work some.  He has never had screening colonoscopy. He has several concerns today.  One is nocturia and some urinary hesitancy.  Another is nocturnal leg cramps.  He has tried Mag-Ox and mustard.  He needs a refill on albuterol for mild as needed asthma.  He also complains about his abdominal girth.  He gets no exercise and does not watch his eating. ----------------------------------------------------------------- Patient complains of recurring muscle cramps and frequent urination.   Review of Systems  Constitutional: Negative.   HENT: Negative.   Eyes: Negative.   Respiratory: Negative.   Cardiovascular: Negative.   Gastrointestinal: Positive for abdominal distention.  Endocrine: Positive for polydipsia and polyuria.  Genitourinary: Negative.   Musculoskeletal: Negative.   Skin: Negative.   Allergic/Immunologic: Negative.   Neurological: Negative.   Hematological: Negative.   Psychiatric/Behavioral: Negative.     Social History      He  reports that he has quit smoking. His smoking use included cigars. He has never used smokeless tobacco. He reports current alcohol use. He reports that he does not use drugs.       Social History   Socioeconomic History  . Marital status: Married    Spouse name: Not on file  . Number of children: Not on file  . Years of education: Not on file  . Highest education level: Not on file  Occupational History  . Not on file  Tobacco Use  .  Smoking status: Former Smoker    Types: Cigars  . Smokeless tobacco: Never Used  . Tobacco comment: every once in a while, very rare.  Substance and Sexual Activity  . Alcohol use: Yes    Alcohol/week: 0.0 standard drinks    Comment: social  . Drug use: No  . Sexual activity: Not on file  Other Topics Concern  . Not on file  Social History Narrative  . Not on file   Social Determinants of Health   Financial Resource Strain:   . Difficulty of Paying Living Expenses: Not on file  Food Insecurity:   . Worried About Charity fundraiser in the Last Year: Not on file  . Ran Out of Food in the Last Year: Not on file  Transportation Needs:   . Lack of Transportation (Medical): Not on file  . Lack of Transportation (Non-Medical): Not on file  Physical Activity:   . Days of Exercise per Week: Not on file  . Minutes of Exercise per Session: Not on file  Stress:   . Feeling of Stress : Not on file  Social Connections:   . Frequency of Communication with Friends and Family: Not on file  . Frequency of Social Gatherings with Friends and Family: Not on file  . Attends Religious Services: Not on file  . Active Member of Clubs or Organizations: Not on file  . Attends Archivist Meetings: Not on file  . Marital Status: Not  on file    Past Medical History:  Diagnosis Date  . Diabetes mellitus without complication ([place])   . Erectile dysfunction   . Gout   . Heart murmur   . Hyperlipidemia   . Hypertension   . Obesity   . Osteoarthritis   . Vitamin D deficiency      Patient Active Problem List   Diagnosis Date Noted  . Aortic valve stenosis 07/11/2017  . HTN (hypertension) 07/09/2017  . Smoker 07/09/2017  . Murmur 07/09/2017  . AB (asthmatic bronchitis) 02/26/2015  . Adaptation reaction 02/26/2015  . Allergic rhinitis 02/26/2015  . Poorly controlled type 2 diabetes mellitus ([place]) 02/26/2015  . ED (erectile dysfunction) of organic origin 02/26/2015  . Gout  02/26/2015  . HLD (hyperlipidemia) 02/26/2015  . Morbid obesity ([place]) 02/26/2015  . Arthritis, degenerative 02/26/2015  . Avitaminosis D 02/26/2015  . Airway hyperreactivity 02/26/2015  . Controlled type 2 diabetes mellitus without complication ([place]) 99991111  . Erectile dysfunction associated with type 2 diabetes mellitus ([place]) 01/07/2015    Past Surgical History:  Procedure Laterality Date  . NO PAST SURGERIES    . SHOULDER ARTHROSCOPY WITH OPEN ROTATOR CUFF REPAIR Right 07/26/2017   Procedure: SHOULDER ARTHROSCOPY WITH OPEN ROTATOR CUFF REPAIR,SUBACROMINAL DECOMPRESSION,DISTAL CLAVICLE EXCISION;  Surgeon: Thornton Park, MD;  Location: [facility] ORS;  Service: Orthopedics;  Laterality: Right;    Family History        Family Status  Relation Name Status  . Mother  Alive  . Father  Deceased        His family history includes Asthma in his mother; Cancer in his father.      Allergies  Allergen Reactions  . Penicillins Hives, Swelling and Other (See Comments)    Has patient had a PCN reaction causing immediate rash, facial/tongue/throat swelling, SOB or lightheadedness with hypotension: Unknown Has patient had a PCN reaction causing severe rash involving mucus membranes or skin necrosis: No Has patient had a PCN reaction that required hospitalization: Yes Has patient had a PCN reaction occurring within the last 10 years: No If all of the above answers are "NO", then may proceed with Cephalosporin use.      Current Outpatient Medications:  .  acetaminophen (TYLENOL) 500 MG tablet, Take 1,000 mg by mouth every 6 (six) hours as needed for moderate pain or headache., Disp: , Rfl:  .  albuterol (PROAIR HFA) 108 (90 BASE) MCG/ACT inhaler, Inhale 1 puff into the lungs every 6 (six) hours as needed for wheezing or shortness of breath. (Patient taking differently: Inhale 1-2 puffs into the lungs every 6 (six) hours as needed for wheezing or shortness of breath. ), Disp: 1 Inhaler, Rfl:  3 .  allopurinol (ZYLOPRIM) 300 MG tablet, TAKE 1 TABLET BY MOUTH EVERY DAY, Disp: 90 tablet, Rfl: 3 .  BD PEN NEEDLE NANO U/F 32G X 4 MM MISC, 1 EACH BY DOES NOT APPLY ROUTE DAILY., Disp: 100 each, Rfl: 11 .  Carboxymethylcellulose Sodium (MOISTURIZING LUBRICANT EYE OP), Place 1 drop into both eyes daily as needed (for dry eyes)., Disp: , Rfl:  .  Cholecalciferol (VITAMIN D) 2000 units CAPS, Take 2,000 Units by mouth every evening. , Disp: , Rfl:  .  doxycycline (VIBRA-TABS) 100 MG tablet, Take 1 tablet (100 mg total) by mouth 2 (two) times daily., Disp: 20 tablet, Rfl: 0 .  ezetimibe (ZETIA) 10 MG tablet, TAKE 1 TABLET BY MOUTH EVERY DAY, Disp: 90 tablet, Rfl: 3 .  glucose blood (ONETOUCH VERIO) test  strip, Use as instructed; BID, Disp: 100 each, Rfl: 12 .  losartan (COZAAR) 100 MG tablet, TAKE 1/2 TABLETS BY MOUTH EVERY DAY, Disp: 60 tablet, Rfl: 0 .  losartan (COZAAR) 50 MG tablet, TAKE 1 TABLET BY MOUTH EVERY DAY, Disp: 90 tablet, Rfl: 2 .  magnesium oxide (MAG-OX) 400 MG tablet, TAKE 1 TABLET BY MOUTH TWICE A DAY, Disp: 180 tablet, Rfl: 3 .  meloxicam (MOBIC) 15 MG tablet, Take 15 mg by mouth daily. , Disp: , Rfl:  .  metFORMIN (GLUCOPHAGE) 500 MG tablet, TAKE 1 TABLET BY MOUTH TWICE A DAY WITH MEALS, Disp: 180 tablet, Rfl: 3 .  ondansetron (ZOFRAN) 4 MG tablet, Take 1 tablet (4 mg total) by mouth every 8 (eight) hours as needed for nausea or vomiting., Disp: 30 tablet, Rfl: 0 .  ONE TOUCH LANCETS MISC, 1 Device by Does not apply route 2 (two) times daily., Disp: 60 each, Rfl: 11 .  oxyCODONE (OXY IR/ROXICODONE) 5 MG immediate release tablet, Take 1 tablet (5 mg total) by mouth every 4 (four) hours as needed for severe pain., Disp: 40 tablet, Rfl: 0 .  telmisartan (MICARDIS) 40 MG tablet, Please specify directions, refills and quantity (Patient not taking: Reported on 08/08/2018), Disp: 30 tablet, Rfl: 11 .  TRESIBA FLEXTOUCH 100 UNIT/ML SOPN FlexTouch Pen, INJECT 30 UNITS TOTAL INTO THE SKIN  DAILY AT 10 PM., Disp: 27 pen, Rfl: 1   Patient Care Team: Jerrol Banana., MD as PCP - General (Family Medicine)    Objective:    Vitals: There were no vitals taken for this visit.  There were no vitals filed for this visit.   Physical Exam Vitals reviewed.  Constitutional:      Appearance: He is well-developed. He is obese.  HENT:     Head: Normocephalic and atraumatic.     Right Ear: External ear normal.     Left Ear: External ear normal.     Nose: Nose normal.  Eyes:     General: No scleral icterus.    Conjunctiva/sclera: Conjunctivae normal.  Neck:     Thyroid: No thyromegaly.  Cardiovascular:     Rate and Rhythm: Normal rate and regular rhythm.     Heart sounds: Murmur present.     Comments: 2/6 murmur loudest at the right upper sternal border Pulmonary:     Effort: Pulmonary effort is normal.     Breath sounds: Normal breath sounds.  Abdominal:     Palpations: Abdomen is soft.  Skin:    General: Skin is warm and dry.     Comments: Atypical nevus of the back.  Neurological:     General: No focal deficit present.     Mental Status: He is alert and oriented to person, place, and time.  Psychiatric:        Mood and Affect: Mood normal.        Behavior: Behavior normal.        Thought Content: Thought content normal.        Judgment: Judgment normal.      Depression Screen PHQ 2/9 Scores 06/28/2017 02/14/2017  PHQ - 2 Score 0 0  PHQ- 9 Score 0 -       Assessment & Plan:     Routine Health Maintenance and Physical Exam  Exercise Activities and Dietary recommendations Goals   None     Immunization History  Administered Date(s) Administered  . Influenza Split 07/05/2011  . Influenza,inj,Quad PF,6+ Mos 05/28/2013, 04/18/2018  .  Influenza-Unspecified 04/29/2017  . Pneumococcal Polysaccharide-23 04/29/2017  . Td 12/30/2003  . Tdap 05/23/2017    Health Maintenance  Topic Date Due  . HIV Screening  03/12/1972  . COLONOSCOPY  03/13/2007   . FOOT EXAM  07/26/2017  . HEMOGLOBIN A1C  10/18/2018  . OPHTHALMOLOGY EXAM  10/26/2018  . TETANUS/TDAP  05/24/2027  . INFLUENZA VACCINE  Completed  . PNEUMOCOCCAL POLYSACCHARIDE VACCINE AGE 80-64 HIGH RISK  Completed  . Hepatitis C Screening  Completed     Discussed health benefits of physical activity, and encouraged him to engage in regular exercise appropriate for his age and condition.   1. Annual physical exam Diet and exercise stressed. - Comprehensive metabolic panel - CBC with Differential/Platelet - TSH - Lipid panel - PSA - HgB A1c  2. Benign prostatic hyperplasia, unspecified whether lower urinary tract symptoms present Try Flomax - Comprehensive metabolic panel - CBC with Differential/Platelet - TSH - Lipid panel - PSA - HgB A1c - tamsulosin (FLOMAX) 0.4 MG CAPS capsule; Take 1 capsule (0.4 mg total) by mouth daily.  Dispense: 30 capsule; Refill: 2  3. Essential hypertension Controlled.  On ARB. - Comprehensive metabolic panel - CBC with Differential/Platelet - TSH - Lipid panel - PSA - HgB A1c  4. Mixed hyperlipidemia On Zetia.  Patient states he gets myalgias with statins - Comprehensive metabolic panel - CBC with Differential/Platelet - TSH - Lipid panel - PSA - HgB A1c  5. Controlled type 2 diabetes mellitus without complication, without long-term current use of insulin ([place]) Lifestyle stressed.  He is on [place]. - Comprehensive metabolic panel - CBC with Differential/Platelet - TSH - Lipid panel - PSA - HgB A1c  6. Screening for prostate cancer  - Comprehensive metabolic panel - CBC with Differential/Platelet - TSH - Lipid panel - PSA - HgB A1c  7. Atypical nevus  - Ambulatory referral to Dermatology  8. 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  Bronchitis/asthma  - albuterol (PROAIR HFA) 108 (90 Base) MCG/ACT inhaler; Inhale 1 puff into the lungs every 6 (six) hours as needed for wheezing or shortness of breath.  Dispense: 18 g; Refill: 3 11. Morbid Obesity 12. Myalgias Try co-q-10 daily for now. Labs ordered. --------------------------------------------------------------------    [name], MD  [facility]

## 2019-07-16 ENCOUNTER — Other Ambulatory Visit: Payer: Self-pay

## 2019-07-16 ENCOUNTER — Encounter: Payer: Self-pay | Admitting: Family Medicine

## 2019-07-16 ENCOUNTER — Ambulatory Visit (INDEPENDENT_AMBULATORY_CARE_PROVIDER_SITE_OTHER): Payer: 59 | Admitting: Family Medicine

## 2019-07-16 ENCOUNTER — Other Ambulatory Visit: Payer: Self-pay | Admitting: Family Medicine

## 2019-07-16 VITALS — BP 132/71 | HR 82 | Temp 95.3°F | Wt 274.2 lb

## 2019-07-16 DIAGNOSIS — E782 Mixed hyperlipidemia: Secondary | ICD-10-CM | POA: Diagnosis not present

## 2019-07-16 DIAGNOSIS — D229 Melanocytic nevi, unspecified: Secondary | ICD-10-CM

## 2019-07-16 DIAGNOSIS — Z0000 Encounter for general adult medical examination without abnormal findings: Secondary | ICD-10-CM | POA: Diagnosis not present

## 2019-07-16 DIAGNOSIS — E119 Type 2 diabetes mellitus without complications: Secondary | ICD-10-CM

## 2019-07-16 DIAGNOSIS — Z1211 Encounter for screening for malignant neoplasm of colon: Secondary | ICD-10-CM | POA: Diagnosis not present

## 2019-07-16 DIAGNOSIS — Z125 Encounter for screening for malignant neoplasm of prostate: Secondary | ICD-10-CM

## 2019-07-16 DIAGNOSIS — N400 Enlarged prostate without lower urinary tract symptoms: Secondary | ICD-10-CM

## 2019-07-16 DIAGNOSIS — J40 Bronchitis, not specified as acute or chronic: Secondary | ICD-10-CM

## 2019-07-16 DIAGNOSIS — I060 Rheumatic aortic stenosis: Secondary | ICD-10-CM

## 2019-07-16 DIAGNOSIS — I10 Essential (primary) hypertension: Secondary | ICD-10-CM

## 2019-07-16 DIAGNOSIS — E785 Hyperlipidemia, unspecified: Secondary | ICD-10-CM

## 2019-07-16 MED ORDER — ALBUTEROL SULFATE HFA 108 (90 BASE) MCG/ACT IN AERS: 1.0000 | INHALATION_SPRAY | Freq: Four times a day (QID) | RESPIRATORY_TRACT | 3 refills | Status: DC | PRN

## 2019-07-16 MED ORDER — TAMSULOSIN HCL 0.4 MG PO CAPS: 0.4000 mg | ORAL_CAPSULE | Freq: Every day | ORAL | 2 refills | Status: DC

## 2019-07-16 NOTE — Telephone Encounter (Signed)
Requested medication (s) are due for refill today: yes  Requested medication (s) are on the active medication list: yes  Last refill:04/18/2019  Future visit scheduled: yes  Notes to clinic:  last lipid panel 04/18/18    Requested Prescriptions  Pending Prescriptions Disp Refills   ezetimibe (ZETIA) 10 MG tablet [Pharmacy Med Name: EZETIMIBE 10 MG TABLET] 90 tablet 3    Sig: TAKE 1 TABLET BY MOUTH EVERY DAY      Cardiovascular:  Antilipid - Sterol Transport Inhibitors Failed - 07/16/2019  1:27 AM      Failed - Total Cholesterol in normal range and within 360 days    Cholesterol, Total  Date Value Ref Range Status  07/26/2016 197 100 - 199 mg/dL Final   Cholesterol  Date Value Ref Range Status  05/23/2017 251 (H) <200 mg/dL Final          Failed - LDL in normal range and within 360 days    LDL Cholesterol (Calc)  Date Value Ref Range Status  05/23/2017  mg/dL (calc) Final    Comment:    . LDL cholesterol not calculated. Triglyceride levels greater than 400 mg/dL invalidate calculated LDL results. . Reference range: <100 . Desirable range <100 mg/dL for primary prevention;   <70 mg/dL for patients with CHD or diabetic patients  with > or = 2 CHD risk factors. Marland Kitchen LDL-C is now calculated using the Martin-Hopkins  calculation, which is a validated novel method providing  better accuracy than the Friedewald equation in the  estimation of LDL-C.  Cresenciano Genre et al. Annamaria Helling. [id number]): 2061-2068  (http://education.[facility].com/faq/FAQ164)           Failed - HDL in normal range and within 360 days    HDL  Date Value Ref Range Status  05/23/2017 34 (L) >40 mg/dL Final  07/26/2016 37 (L) >39 mg/dL Final          Failed - Triglycerides in normal range and within 360 days    Triglycerides  Date Value Ref Range Status  05/23/2017 489 (H) <150 mg/dL Final          Passed - Valid encounter within last 12 months    Recent Outpatient Visits           11  months ago Acute non-recurrent frontal sinusitis   [facility] [place], [place]   1 year ago Essential hypertension   [facility] Jerrol Banana., MD   1 year ago Poorly controlled type 2 diabetes mellitus [facility])   [facility] Jerrol Banana., MD   1 year ago Essential hypertension   [facility] Jerrol Banana., MD   1 year ago Controlled type 2 diabetes mellitus without complication, without long-term current use of insulin [facility])   [facility] Jerrol Banana., MD

## 2019-07-16 NOTE — Patient Instructions (Signed)
Try OTC Co-Q-10 1 at night. Follow up 2-3 months.

## 2019-07-17 ENCOUNTER — Telehealth: Payer: Self-pay | Admitting: Gastroenterology

## 2019-07-17 NOTE — Telephone Encounter (Signed)
Pt left vm to schedule a referral from his PCP for a colonoscopy

## 2019-07-18 ENCOUNTER — Other Ambulatory Visit: Payer: Self-pay | Admitting: Family Medicine

## 2019-07-19 NOTE — Telephone Encounter (Signed)
Tried to call patient but patient answer and disconnected the phone

## 2019-07-21 LAB — CBC WITH DIFFERENTIAL/PLATELET
Basophils Absolute: 0.1 10*3/uL (ref 0.0–0.2)
Basos: 1 %
EOS (ABSOLUTE): 0.1 10*3/uL (ref 0.0–0.4)
Eos: 2 %
Hematocrit: 48.2 % (ref 37.5–51.0)
Hemoglobin: 15.9 g/dL (ref 13.0–17.7)
Immature Grans (Abs): 0 10*3/uL (ref 0.0–0.1)
Immature Granulocytes: 1 %
Lymphocytes Absolute: 1.9 10*3/uL (ref 0.7–3.1)
Lymphs: 37 %
MCH: 30.8 pg (ref 26.6–33.0)
MCHC: 33 g/dL (ref 31.5–35.7)
MCV: 93 fL (ref 79–97)
Monocytes Absolute: 0.4 10*3/uL (ref 0.1–0.9)
Monocytes: 8 %
Neutrophils Absolute: 2.6 10*3/uL (ref 1.4–7.0)
Neutrophils: 51 %
Platelets: 131 10*3/uL — ABNORMAL LOW (ref 150–450)
RBC: 5.17 x10E6/uL (ref 4.14–5.80)
RDW: 13 % (ref 11.6–15.4)
WBC: 5 10*3/uL (ref 3.4–10.8)

## 2019-07-21 LAB — COMPREHENSIVE METABOLIC PANEL
ALT: 68 IU/L — ABNORMAL HIGH (ref 0–44)
AST: 64 IU/L — ABNORMAL HIGH (ref 0–40)
Albumin/Globulin Ratio: 1.6 (ref 1.2–2.2)
Albumin: 4.1 g/dL (ref 3.8–4.8)
Alkaline Phosphatase: 95 IU/L (ref 39–117)
BUN/Creatinine Ratio: 14 (ref 10–24)
BUN: 12 mg/dL (ref 8–27)
Bilirubin Total: 0.5 mg/dL (ref 0.0–1.2)
CO2: 23 mmol/L (ref 20–29)
Calcium: 9.3 mg/dL (ref 8.6–10.2)
Chloride: 99 mmol/L (ref 96–106)
Creatinine, Ser: 0.84 mg/dL (ref 0.76–1.27)
GFR calc Af Amer: 108 mL/min/{1.73_m2} (ref >59)
GFR calc non Af Amer: 94 mL/min/{1.73_m2} (ref >59)
Globulin, Total: 2.6 g/dL (ref 1.5–4.5)
Glucose: 284 mg/dL — ABNORMAL HIGH (ref 65–99)
Potassium: 4.4 mmol/L (ref 3.5–5.2)
Sodium: 137 mmol/L (ref 134–144)
Total Protein: 6.7 g/dL (ref 6.0–8.5)

## 2019-07-21 LAB — HEMOGLOBIN A1C
Est. average glucose Bld gHb Est-mCnc: 280 mg/dL
Hgb A1c MFr Bld: 11.4 % — ABNORMAL HIGH (ref 4.8–5.6)

## 2019-07-21 LAB — LIPID PANEL
Chol/HDL Ratio: 6.7 ratio — ABNORMAL HIGH (ref 0.0–5.0)
Cholesterol, Total: 227 mg/dL — ABNORMAL HIGH (ref 100–199)
HDL: 34 mg/dL — ABNORMAL LOW (ref >39)
LDL Chol Calc (NIH): 120 mg/dL — ABNORMAL HIGH (ref 0–99)
Triglycerides: 414 mg/dL — ABNORMAL HIGH (ref 0–149)
VLDL Cholesterol Cal: 73 mg/dL — ABNORMAL HIGH (ref 5–40)

## 2019-07-21 LAB — TSH: TSH: 1.97 u[IU]/mL (ref 0.450–4.500)

## 2019-07-21 LAB — PSA: Prostate Specific Ag, Serum: 1.4 ng/mL (ref 0.0–4.0)

## 2019-07-24 ENCOUNTER — Encounter: Payer: Self-pay | Admitting: *Deleted

## 2019-07-25 ENCOUNTER — Telehealth: Payer: Self-pay

## 2019-07-25 NOTE — Telephone Encounter (Signed)
Lab results given to patient's wife Gwinda Passe.

## 2019-07-30 ENCOUNTER — Other Ambulatory Visit: Payer: Self-pay

## 2019-07-30 ENCOUNTER — Telehealth: Payer: Self-pay

## 2019-07-30 DIAGNOSIS — Z1211 Encounter for screening for malignant neoplasm of colon: Secondary | ICD-10-CM

## 2019-07-30 NOTE — Telephone Encounter (Signed)
Gastroenterology Pre-Procedure Review  Request Date: 08/24/19 Requesting Physician: Dr. Allen Norris  PATIENT REVIEW QUESTIONS: The patient responded to the following health history questions as indicated:    1. Are you having any GI issues? no 2. Do you have a personal history of Polyps? no 3. Do you have a family history of Colon Cancer or Polyps? no 4. Diabetes Mellitus? no 5. Joint replacements in the past 12 months?no 6. Major health problems in the past 3 months?no 7. Any artificial heart valves, MVP, or defibrillator?no    MEDICATIONS & ALLERGIES:    Patient reports the following regarding taking any anticoagulation/antiplatelet therapy:   Plavix, Coumadin, Eliquis, Xarelto, Lovenox, Pradaxa, Brilinta, or Effient? no Aspirin? no  Patient confirms/reports the following medications:  Current Outpatient Medications  Medication Sig Dispense Refill  . acetaminophen (TYLENOL) 500 MG tablet Take 1,000 mg by mouth every 6 (six) hours as needed for moderate pain or headache.    . albuterol (PROAIR HFA) 108 (90 Base) MCG/ACT inhaler Inhale 1 puff into the lungs every 6 (six) hours as needed for wheezing or shortness of breath. 18 g 3  . allopurinol (ZYLOPRIM) 300 MG tablet TAKE 1 TABLET BY MOUTH EVERY DAY 90 tablet 3  . BD PEN NEEDLE NANO U/F 32G X 4 MM MISC 1 EACH BY DOES NOT APPLY ROUTE DAILY. 100 each 11  . Carboxymethylcellulose Sodium (MOISTURIZING LUBRICANT EYE OP) Place 1 drop into both eyes daily as needed (for dry eyes).    . Cholecalciferol (VITAMIN D) 2000 units CAPS Take 2,000 Units by mouth every evening.     Marland Kitchen doxycycline (VIBRA-TABS) 100 MG tablet Take 1 tablet (100 mg total) by mouth 2 (two) times daily. 20 tablet 0  . ezetimibe (ZETIA) 10 MG tablet TAKE 1 TABLET BY MOUTH EVERY DAY 90 tablet 3  . glucose blood (ONETOUCH VERIO) test strip Use as instructed; BID 100 each 12  . losartan (COZAAR) 100 MG tablet TAKE 1/2 TABLETS BY MOUTH EVERY DAY (Patient not taking: Reported on  07/16/2019) 60 tablet 0  . losartan (COZAAR) 50 MG tablet TAKE 1 TABLET BY MOUTH EVERY DAY 90 tablet 2  . magnesium oxide (MAG-OX) 400 MG tablet TAKE 1 TABLET BY MOUTH TWICE A DAY 180 tablet 3  . meloxicam (MOBIC) 15 MG tablet Take 15 mg by mouth daily.     . metFORMIN (GLUCOPHAGE) 500 MG tablet TAKE 1 TABLET BY MOUTH TWICE A DAY WITH MEALS 180 tablet 3  . ondansetron (ZOFRAN) 4 MG tablet Take 1 tablet (4 mg total) by mouth every 8 (eight) hours as needed for nausea or vomiting. (Patient not taking: Reported on 07/16/2019) 30 tablet 0  . ONE TOUCH LANCETS MISC 1 Device by Does not apply route 2 (two) times daily. 60 each 11  . oxyCODONE (OXY IR/ROXICODONE) 5 MG immediate release tablet Take 1 tablet (5 mg total) by mouth every 4 (four) hours as needed for severe pain. (Patient not taking: Reported on 07/16/2019) 40 tablet 0  . tamsulosin (FLOMAX) 0.4 MG CAPS capsule Take 1 capsule (0.4 mg total) by mouth daily. 30 capsule 2  . telmisartan (MICARDIS) 40 MG tablet Please specify directions, refills and quantity (Patient not taking: Reported on 08/08/2018) 30 tablet 11  . TRESIBA FLEXTOUCH 100 UNIT/ML SOPN FlexTouch Pen INJECT 30 UNITS TOTAL INTO THE SKIN DAILY AT 10 PM. 27 pen 1   No current facility-administered medications for this visit.    Patient confirms/reports the following allergies:  Allergies  Allergen Reactions  .  Penicillins Hives, Swelling and Other (See Comments)    Has patient had a PCN reaction causing immediate rash, facial/tongue/throat swelling, SOB or lightheadedness with hypotension: Unknown Has patient had a PCN reaction causing severe rash involving mucus membranes or skin necrosis: No Has patient had a PCN reaction that required hospitalization: Yes Has patient had a PCN reaction occurring within the last 10 years: No If all of the above answers are "NO", then may proceed with Cephalosporin use.     No orders of the defined types were placed in this  encounter.   AUTHORIZATION INFORMATION Primary Insurance: 1D#: Group #:  Secondary Insurance: 1D#: Group #:  SCHEDULE INFORMATION: Date: Friday 08/24/19 Time: Location:[facility]

## 2019-08-11 NOTE — Progress Notes (Addendum)
Cardiology Office Note  Date:  08/13/2019   ID:  Jeffery, West 1955/12/19, MRN [id number]  PCP:  Jerrol Banana., MD   Chief Complaint  Patient presents with  . office visit    Murmur-Meds verbally reviewed with patient.    HPI:  Jeffery West is a 64 year old gentleman with past medical history of Diabetes  HBA1C 9.9 Smoker, remote HTN Hyperlipidemia Morbid obesity  aortic valve stenosis  on echocardiogram June 2017  Moderate aortic valve stenosis Who presents for f/u of his aortic valve stenosis and murmur  More tired, Dozing off Will get up several times per night to urinate affecting his sleep  Prior echocardiogram reviewed with him in detail Prior gradient in 2017 Mean gradient 25 mm Hg peak gradient 40 mmHg, peak velocity 300 cm/sec No echocardiogram since that time  Lab work reviewed HBA1C 11.4 in Jan 2021 Total chol 227 LDL 120 Elevated AST/ALT  Leg cramping, severe at times sedentary  EKG personally reviewed by myself on todays visit Shows normal sinus rhythm rate 89 bpm nonspecific ST and T wave abnormality in lead I and aVL   PMH:   has a past medical history of Diabetes mellitus without complication ([place]), Erectile dysfunction, Gout, Heart murmur, Hyperlipidemia, Hypertension, Obesity, Osteoarthritis, and Vitamin D deficiency.  PSH:    Past Surgical History:  Procedure Laterality Date  . NO PAST SURGERIES    . SHOULDER ARTHROSCOPY WITH OPEN ROTATOR CUFF REPAIR Right 07/26/2017   Procedure: SHOULDER ARTHROSCOPY WITH OPEN ROTATOR CUFF REPAIR,SUBACROMINAL DECOMPRESSION,DISTAL CLAVICLE EXCISION;  Surgeon: Thornton Park, MD;  Location: [facility] ORS;  Service: Orthopedics;  Laterality: Right;    Current Outpatient Medications  Medication Sig Dispense Refill  . acetaminophen (TYLENOL) 500 MG tablet Take 1,000 mg by mouth every 6 (six) hours as needed for moderate pain or headache.    . albuterol (PROAIR HFA) 108 (90 Base) MCG/ACT inhaler Inhale 1  puff into the lungs every 6 (six) hours as needed for wheezing or shortness of breath. 18 g 3  . allopurinol (ZYLOPRIM) 300 MG tablet TAKE 1 TABLET BY MOUTH EVERY DAY 90 tablet 3  . BD PEN NEEDLE NANO U/F 32G X 4 MM MISC 1 EACH BY DOES NOT APPLY ROUTE DAILY. 100 each 11  . Cholecalciferol (VITAMIN D) 2000 units CAPS Take 2,000 Units by mouth every evening.     . ezetimibe (ZETIA) 10 MG tablet TAKE 1 TABLET BY MOUTH EVERY DAY 90 tablet 3  . glucose blood (ONETOUCH VERIO) test strip Use as instructed; BID 100 each 12  . losartan (COZAAR) 50 MG tablet TAKE 1 TABLET BY MOUTH EVERY DAY 90 tablet 2  . magnesium oxide (MAG-OX) 400 MG tablet TAKE 1 TABLET BY MOUTH TWICE A DAY 180 tablet 3  . meloxicam (MOBIC) 15 MG tablet Take 15 mg by mouth daily.     . metFORMIN (GLUCOPHAGE) 500 MG tablet TAKE 1 TABLET BY MOUTH TWICE A DAY WITH MEALS 180 tablet 3  . ONE TOUCH LANCETS MISC 1 Device by Does not apply route 2 (two) times daily. 60 each 11  . tamsulosin (FLOMAX) 0.4 MG CAPS capsule Take 1 capsule (0.4 mg total) by mouth daily. 30 capsule 2  . TRESIBA FLEXTOUCH 100 UNIT/ML SOPN FlexTouch Pen INJECT 30 UNITS TOTAL INTO THE SKIN DAILY AT 10 PM. 27 pen 1  . Carboxymethylcellulose Sodium (MOISTURIZING LUBRICANT EYE OP) Place 1 drop into both eyes daily as needed (for dry eyes).    Marland Kitchen doxycycline (VIBRA-TABS)  100 MG tablet Take 1 tablet (100 mg total) by mouth 2 (two) times daily. (Patient not taking: Reported on 08/13/2019) 20 tablet 0  . losartan (COZAAR) 100 MG tablet TAKE 1/2 TABLETS BY MOUTH EVERY DAY (Patient not taking: Reported on 07/16/2019) 60 tablet 0  . ondansetron (ZOFRAN) 4 MG tablet Take 1 tablet (4 mg total) by mouth every 8 (eight) hours as needed for nausea or vomiting. (Patient not taking: Reported on 07/16/2019) 30 tablet 0  . oxyCODONE (OXY IR/ROXICODONE) 5 MG immediate release tablet Take 1 tablet (5 mg total) by mouth every 4 (four) hours as needed for severe pain. (Patient not taking: Reported  on 07/16/2019) 40 tablet 0  . telmisartan (MICARDIS) 40 MG tablet Please specify directions, refills and quantity (Patient not taking: Reported on 08/08/2018) 30 tablet 11   No current facility-administered medications for this visit.     Allergies:   Penicillins   Social History:  The patient  reports that he has quit smoking. His smoking use included cigars. He has never used smokeless tobacco. He reports current alcohol use. He reports that he does not use drugs.   Family History:   family history includes Asthma in his mother; Cancer in his father.    Review of Systems: Review of Systems  Constitutional: Negative.   HENT: Negative.   Respiratory: Negative.   Cardiovascular: Negative.   Gastrointestinal: Negative.   Musculoskeletal: Negative.   Neurological: Negative.   Psychiatric/Behavioral: Negative.   All other systems reviewed and are negative.   PHYSICAL EXAM: VS:  BP 110/74   Pulse 89   Ht 5' 11.5" (1.816 m)   Wt 270 lb (122.5 kg)   SpO2 96%   BMI 37.13 kg/m  , BMI Body mass index is 37.13 kg/m. Constitutional:  oriented to person, place, and time. No distress.  HENT:  Head: Grossly normal Eyes:  no discharge. No scleral icterus.  Neck: No JVD, no carotid bruits  Cardiovascular: Regular rate and rhythm, no murmurs appreciated [id number] systolic ejection murmur right sternal border Pulmonary/Chest: Clear to auscultation bilaterally, no wheezes or rails Abdominal: Soft.  no distension.  no tenderness.  Musculoskeletal: Normal range of motion Neurological:  normal muscle tone. Coordination normal. No atrophy Skin: Skin warm and dry Psychiatric: normal affect, pleasant   Recent Labs: 07/20/2019: ALT 68; BUN 12; Creatinine, Ser 0.84; Hemoglobin 15.9; Platelets 131; Potassium 4.4; Sodium 137; TSH 1.970    Lipid Panel Lab Results  Component Value Date   CHOL 227 (H) 07/20/2019   HDL 34 (L) 07/20/2019   LDLCALC 120 (H) 07/20/2019   TRIG 414 (H) 07/20/2019       Wt Readings from Last 3 Encounters:  08/13/19 270 lb (122.5 kg)  07/16/19 274 lb 3.2 oz (124.4 kg)  08/08/18 271 lb 12.8 oz (123.3 kg)      ASSESSMENT AND PLAN:  Poorly controlled type 2 diabetes mellitus ([place]) We have encouraged continued exercise, careful diet management in an effort to lose weight. Recommended strict diet May need follow-up with endocrine.  In the past was seen by Dr. Solum/endocrine  Mixed hyperlipidemia Tolerating Zetia,  Off statin secondary to cramps   Essential hypertension Blood pressure is well controlled on today's visit. No changes made to the medications.  Smoker Reports that he stop smoking   Aortic valve stenosis, etiology of cardiac valve disease unspecified -  Echo ordered, Long discussion with him concerning previous echocardiogram, and progression of the aortic valve stenosis, various options for treatment if  it does get to severe stenosis  Erectile dysfunction Acceptable risk for him to take medications for ED We have given him a prescription for sildenafil   Total encounter time more than 25 minutes  Greater than 50% was spent in counseling and coordination of care with the patient  Disposition:   F/U  12 months   No orders of the defined types were placed in this encounter.    Signed, Jeffery West, M.D., Ph.D. 08/13/2019  [place], [place]

## 2019-08-13 ENCOUNTER — Encounter: Payer: Self-pay | Admitting: Cardiovascular Disease

## 2019-08-13 ENCOUNTER — Other Ambulatory Visit: Payer: Self-pay

## 2019-08-13 ENCOUNTER — Ambulatory Visit (INDEPENDENT_AMBULATORY_CARE_PROVIDER_SITE_OTHER): Payer: 59 | Admitting: Cardiovascular Disease

## 2019-08-13 VITALS — BP 110/74 | HR 89 | Ht 71.5 in | Wt 270.0 lb

## 2019-08-13 DIAGNOSIS — I10 Essential (primary) hypertension: Secondary | ICD-10-CM

## 2019-08-13 DIAGNOSIS — I060 Rheumatic aortic stenosis: Secondary | ICD-10-CM | POA: Diagnosis not present

## 2019-08-13 DIAGNOSIS — F17200 Nicotine dependence, unspecified, uncomplicated: Secondary | ICD-10-CM

## 2019-08-13 DIAGNOSIS — E1165 Type 2 diabetes mellitus with hyperglycemia: Secondary | ICD-10-CM

## 2019-08-13 MED ORDER — SILDENAFIL CITRATE 20 MG PO TABS: 20.0000 mg | ORAL_TABLET | Freq: Three times a day (TID) | ORAL | 1 refills | Status: DC | PRN

## 2019-08-13 NOTE — Patient Instructions (Addendum)
Medication Instructions:  No changes  If you need a refill on your cardiac medications before your next appointment, please call your pharmacy.    Lab work: No new labs needed   If you have labs (blood work) drawn today and your tests are completely normal, you will receive your results only by: . MyChart Message (if you have MyChart) OR . A paper copy in the mail If you have any lab test that is abnormal or we need to change your treatment, we will call you to review the results.   Testing/Procedures: Echo for aortic valve stenosis Your physician has requested that you have an echocardiogram. Echocardiography is a painless test that uses sound waves to create images of your heart. It provides your doctor with information about the size and shape of your heart and how well your heart's chambers and valves are working. This procedure takes approximately one hour. There are no restrictions for this procedure.     Follow-Up: At [facility], you and your health needs are our priority.  As part of our continuing mission to provide you with exceptional heart care, we have created designated Provider Care Teams.  These Care Teams include your primary Cardiologist (physician) and Advanced Practice Providers (APPs -  Physician Assistants and Nurse Practitioners) who all work together to provide you with the care you need, when you need it.  . You will need a follow up appointment in 12 months  . Providers on your designated Care Team:   . Christopher Berge, NP . Ryan Dunn, PA-C . Jacquelyn Visser, PA-C  Any Other Special Instructions Will Be Listed Below (If Applicable).  COVID-19 Vaccine Information can be found at: [email or web address] For questions related to vaccine distribution or appointments, please email [email or web address] or call 336-890-1188.     

## 2019-08-16 NOTE — Addendum Note (Signed)
Addended by: Raelene Bott, Tracie Lindbloom L on: 08/16/2019 08:27 AM   Modules accepted: Orders

## 2019-08-22 ENCOUNTER — Other Ambulatory Visit
Admission: RE | Admit: 2019-08-22 | Discharge: 2019-08-22 | Disposition: A | Discharge status: Home / Self Care | Payer: 59 | Source: Ambulatory Visit | Attending: Gastroenterology | Admitting: Gastroenterology

## 2019-08-22 ENCOUNTER — Other Ambulatory Visit: Payer: Self-pay

## 2019-08-22 DIAGNOSIS — Z20822 Contact with and (suspected) exposure to covid-19: Secondary | ICD-10-CM | POA: Insufficient documentation

## 2019-08-22 DIAGNOSIS — Z01812 Encounter for preprocedural laboratory examination: Secondary | ICD-10-CM | POA: Diagnosis not present

## 2019-08-22 LAB — SARS CORONAVIRUS 2 (TAT 6-24 HRS): SARS Coronavirus 2: NEGATIVE

## 2019-08-24 ENCOUNTER — Encounter
Admission: RE | Disposition: A | Discharge status: Home / Self Care | Payer: Self-pay | Source: Ambulatory Visit | Attending: Gastroenterology

## 2019-08-24 ENCOUNTER — Ambulatory Visit: Payer: No Typology Code available for payment source | Admitting: Anesthesiology

## 2019-08-24 ENCOUNTER — Ambulatory Visit
Admission: RE | Admit: 2019-08-24 | Discharge: 2019-08-24 | Disposition: A | Discharge status: Home / Self Care | Payer: No Typology Code available for payment source | Source: Ambulatory Visit | Attending: Gastroenterology | Admitting: Gastroenterology

## 2019-08-24 ENCOUNTER — Other Ambulatory Visit: Payer: Self-pay

## 2019-08-24 DIAGNOSIS — M109 Gout, unspecified: Secondary | ICD-10-CM | POA: Insufficient documentation

## 2019-08-24 DIAGNOSIS — Z7984 Long term (current) use of oral hypoglycemic drugs: Secondary | ICD-10-CM | POA: Insufficient documentation

## 2019-08-24 DIAGNOSIS — E785 Hyperlipidemia, unspecified: Secondary | ICD-10-CM | POA: Insufficient documentation

## 2019-08-24 DIAGNOSIS — K635 Polyp of colon: Secondary | ICD-10-CM | POA: Diagnosis not present

## 2019-08-24 DIAGNOSIS — E669 Obesity, unspecified: Secondary | ICD-10-CM | POA: Diagnosis not present

## 2019-08-24 DIAGNOSIS — K640 First degree hemorrhoids: Secondary | ICD-10-CM | POA: Diagnosis not present

## 2019-08-24 DIAGNOSIS — Z1211 Encounter for screening for malignant neoplasm of colon: Secondary | ICD-10-CM | POA: Diagnosis present

## 2019-08-24 DIAGNOSIS — Z6837 Body mass index (BMI) 37.0-37.9, adult: Secondary | ICD-10-CM | POA: Insufficient documentation

## 2019-08-24 DIAGNOSIS — Z79899 Other long term (current) drug therapy: Secondary | ICD-10-CM | POA: Insufficient documentation

## 2019-08-24 DIAGNOSIS — E559 Vitamin D deficiency, unspecified: Secondary | ICD-10-CM | POA: Insufficient documentation

## 2019-08-24 DIAGNOSIS — E119 Type 2 diabetes mellitus without complications: Secondary | ICD-10-CM | POA: Diagnosis not present

## 2019-08-24 DIAGNOSIS — D125 Benign neoplasm of sigmoid colon: Secondary | ICD-10-CM | POA: Diagnosis not present

## 2019-08-24 DIAGNOSIS — Z87891 Personal history of nicotine dependence: Secondary | ICD-10-CM | POA: Diagnosis not present

## 2019-08-24 DIAGNOSIS — I10 Essential (primary) hypertension: Secondary | ICD-10-CM | POA: Insufficient documentation

## 2019-08-24 DIAGNOSIS — Z791 Long term (current) use of non-steroidal anti-inflammatories (NSAID): Secondary | ICD-10-CM | POA: Diagnosis not present

## 2019-08-24 HISTORY — PX: COLONOSCOPY WITH PROPOFOL: SHX5780

## 2019-08-24 LAB — GLUCOSE, CAPILLARY: Glucose-Capillary: 261 mg/dL — ABNORMAL HIGH (ref 70–99)

## 2019-08-24 SURGERY — COLONOSCOPY WITH PROPOFOL
Anesthesia: General

## 2019-08-24 MED ORDER — SODIUM CHLORIDE 0.9 % IV SOLN
INTRAVENOUS | Status: DC
  Administered 2019-08-24: 1000 mL via INTRAVENOUS

## 2019-08-24 MED ORDER — PROPOFOL 500 MG/50ML IV EMUL
INTRAVENOUS | Status: DC | PRN
  Administered 2019-08-24: 140 ug/kg/min via INTRAVENOUS

## 2019-08-24 NOTE — Anesthesia Postprocedure Evaluation (Signed)
Anesthesia Post Note  Patient: Jeffery West  Procedure(s) Performed: COLONOSCOPY WITH PROPOFOL (N/A )  Patient location during evaluation: Endoscopy Anesthesia Type: General Level of consciousness: awake and alert Pain management: pain level controlled Vital Signs Assessment: post-procedure vital signs reviewed and stable Respiratory status: spontaneous breathing, nonlabored ventilation, respiratory function stable and patient connected to nasal cannula oxygen Cardiovascular status: blood pressure returned to baseline and stable Postop Assessment: no apparent nausea or vomiting Anesthetic complications: no     Last Vitals:  Vitals:   08/24/19 1003 08/24/19 1013  BP: 106/67 126/74  Pulse: 89   Resp: 16   Temp: 36.8 C   SpO2: 93%     Last Pain:  Vitals:   08/24/19 1013  TempSrc:   PainSc: 0-No pain                 Arita Miss

## 2019-08-24 NOTE — H&P (Signed)
Lucilla Lame, MD [address] [place], [place] [address] Phone: [phone] Fax : [phone]  Primary Care Physician:  Jerrol Banana., MD Primary Gastroenterologist:  Dr. Allen Norris  Pre-Procedure History & Physical: HPI:  Jeffery West is a 64 y.o. male is here for a screening colonoscopy.   Past Medical History:  Diagnosis Date  . Diabetes mellitus without complication ([place])   . Erectile dysfunction   . Gout   . Heart murmur   . Hyperlipidemia   . Hypertension   . Obesity   . Osteoarthritis   . Vitamin D deficiency     Past Surgical History:  Procedure Laterality Date  . NO PAST SURGERIES    . SHOULDER ARTHROSCOPY WITH OPEN ROTATOR CUFF REPAIR Right 07/26/2017   Procedure: SHOULDER ARTHROSCOPY WITH OPEN ROTATOR CUFF REPAIR,SUBACROMINAL DECOMPRESSION,DISTAL CLAVICLE EXCISION;  Surgeon: Thornton Park, MD;  Location: [facility] ORS;  Service: Orthopedics;  Laterality: Right;    Prior to Admission medications   Medication Sig Start Date End Date Taking? Authorizing Provider  acetaminophen (TYLENOL) 500 MG tablet Take 1,000 mg by mouth every 6 (six) hours as needed for moderate pain or headache.   Yes [provider]  albuterol (PROAIR HFA) 108 (90 Base) MCG/ACT inhaler Inhale 1 puff into the lungs every 6 (six) hours as needed for wheezing or shortness of breath. 07/16/19  Yes Jerrol Banana., MD  allopurinol (ZYLOPRIM) 300 MG tablet TAKE 1 TABLET BY MOUTH EVERY DAY 07/18/19  Yes Jerrol Banana., MD  BD PEN NEEDLE NANO U/F 32G X 4 MM MISC 1 EACH BY DOES NOT APPLY ROUTE DAILY. 09/26/18  Yes Jerrol Banana., MD  Carboxymethylcellulose Sodium (MOISTURIZING LUBRICANT EYE OP) Place 1 drop into both eyes daily as needed (for dry eyes).   Yes [provider]  Cholecalciferol (VITAMIN D) 2000 units CAPS Take 2,000 Units by mouth every evening.    Yes [provider]  doxycycline (VIBRA-TABS) 100 MG tablet Take 1 tablet (100 mg  total) by mouth 2 (two) times daily. 08/08/18  Yes Carmon Ginsberg, PA  ezetimibe (ZETIA) 10 MG tablet TAKE 1 TABLET BY MOUTH EVERY DAY 07/17/19  Yes Jerrol Banana., MD  glucose blood [facility] VERIO) test strip Use as instructed; BID 08/08/17  Yes Jerrol Banana., MD  losartan (COZAAR) 100 MG tablet TAKE 1/2 TABLETS BY MOUTH EVERY DAY 10/23/18  Yes Jerrol Banana., MD  losartan (COZAAR) 50 MG tablet TAKE 1 TABLET BY MOUTH EVERY DAY 04/02/19  Yes Jerrol Banana., MD  magnesium oxide (MAG-OX) 400 MG tablet TAKE 1 TABLET BY MOUTH TWICE A DAY 01/17/19  Yes Jerrol Banana., MD  meloxicam (MOBIC) 15 MG tablet Take 15 mg by mouth daily.  02/16/17  Yes [provider]  metFORMIN (GLUCOPHAGE) 500 MG tablet TAKE 1 TABLET BY MOUTH TWICE A DAY WITH MEALS 01/17/19  Yes Jerrol Banana., MD  ONE TOUCH LANCETS MISC 1 Device by Does not apply route 2 (two) times daily. 08/08/17  Yes Jerrol Banana., MD  sildenafil (REVATIO) 20 MG tablet Take 1 tablet (20 mg total) by mouth 3 (three) times daily as needed. 08/13/19  Yes Minna Merritts, MD  tamsulosin (FLOMAX) 0.4 MG CAPS capsule Take 1 capsule (0.4 mg total) by mouth daily. 07/16/19  Yes Jerrol Banana., MD  telmisartan (MICARDIS) 40 MG tablet Please specify directions, refills and quantity 03/29/18  Yes  Jerrol Banana., MD  TRESIBA FLEXTOUCH 100 UNIT/ML SOPN FlexTouch Pen INJECT 30 UNITS TOTAL INTO THE SKIN DAILY AT 10 PM. 01/23/19  Yes Jerrol Banana., MD  ondansetron (ZOFRAN) 4 MG tablet Take 1 tablet (4 mg total) by mouth every 8 (eight) hours as needed for nausea or vomiting. Patient not taking: Reported on 08/24/2019 07/26/17   Thornton Park, MD  oxyCODONE (OXY IR/ROXICODONE) 5 MG immediate release tablet Take 1 tablet (5 mg total) by mouth every 4 (four) hours as needed for severe pain. Patient not taking: Reported on 07/16/2019 07/26/17   Thornton Park, MD    Allergies as of 07/31/2019 -  Review Complete 07/16/2019  Allergen Reaction Noted  . Penicillins Hives, Swelling, and Other (See Comments) 01/07/2015    Family History  Problem Relation Age of Onset  . Asthma Mother   . Cancer Father        possibly in liver and lung    Social History   Socioeconomic History  . Marital status: Married    Spouse name: Not on file  . Number of children: Not on file  . Years of education: Not on file  . Highest education level: Not on file  Occupational History  . Not on file  Tobacco Use  . Smoking status: Former Smoker    Types: Cigars  . Smokeless tobacco: Never Used  . Tobacco comment: every once in a while, very rare.  Substance and Sexual Activity  . Alcohol use: Yes    Alcohol/week: 0.0 standard drinks    Comment: social  . Drug use: No  . Sexual activity: Not on file  Other Topics Concern  . Not on file  Social History Narrative  . Not on file   Social Determinants of Health   Financial Resource Strain:   . Difficulty of Paying Living Expenses: Not on file  Food Insecurity:   . Worried About Charity fundraiser in the Last Year: Not on file  . Ran Out of Food in the Last Year: Not on file  Transportation Needs:   . Lack of Transportation (Medical): Not on file  . Lack of Transportation (Non-Medical): Not on file  Physical Activity:   . Days of Exercise per Week: Not on file  . Minutes of Exercise per Session: Not on file  Stress:   . Feeling of Stress : Not on file  Social Connections:   . Frequency of Communication with Friends and Family: Not on file  . Frequency of Social Gatherings with Friends and Family: Not on file  . Attends Religious Services: Not on file  . Active Member of Clubs or Organizations: Not on file  . Attends Archivist Meetings: Not on file  . Marital Status: Not on file  Intimate Partner Violence:   . Fear of Current or Ex-Partner: Not on file  . Emotionally Abused: Not on file  . Physically Abused: Not on file    . Sexually Abused: Not on file    Review of Systems: See HPI, otherwise negative ROS  Physical Exam: BP 138/69   Pulse 89   Temp (!) 96.5 F (35.8 C) (Temporal)   Resp 18   Ht 5' 11.5" (1.816 m)   Wt 122.5 kg   SpO2 97%   BMI 37.13 kg/m  General:   Alert,  pleasant and cooperative in NAD Head:  Normocephalic and atraumatic. Neck:  Supple; no masses or thyromegaly. Lungs:  Clear throughout to auscultation.  Heart:  Regular rate and rhythm. Abdomen:  Soft, nontender and nondistended. Normal bowel sounds, without guarding, and without rebound.   Neurologic:  Alert and  oriented x4;  grossly normal neurologically.  Impression/Plan: Jeffery West is now here to undergo a screening colonoscopy.  Risks, benefits, and alternatives regarding colonoscopy have been reviewed with the patient.  Questions have been answered.  All parties agreeable.

## 2019-08-24 NOTE — Anesthesia Preprocedure Evaluation (Signed)
Anesthesia Evaluation  Patient identified by MRN, date of birth, ID band Patient awake    Reviewed: Allergy & Precautions, NPO status , Patient's Chart, lab work & pertinent test results  History of Anesthesia Complications Negative for: history of anesthetic complications  Airway Mallampati: II  TM Distance: >3 FB Neck ROM: Full   Comment: Large neck, large beard Dental no notable dental hx. (+) Teeth Intact   Pulmonary neg pulmonary ROS, asthma , neg sleep apnea, neg COPD, Patient abstained from smoking.Not current smoker, former smoker,  Mild asthma triggered by pet dander; does not use inhalers regularly   Pulmonary exam normal breath sounds clear to auscultation       Cardiovascular Exercise Tolerance: Good METShypertension, Pt. on medications (-) CAD and (-) Past MI negative cardio ROS  (-) dysrhythmias + Valvular Problems/Murmurs AS  Rhythm:Regular Rate:Normal + Systolic murmurs Moderate aortic stenosis, no symptoms.   Neuro/Psych negative neurological ROS  negative psych ROS   GI/Hepatic neg GERD  ,(+)     (-) substance abuse  ,   Endo/Other  diabetes, Type 2, Insulin Dependent  Renal/GU negative Renal ROS     Musculoskeletal   Abdominal   Peds  Hematology   Anesthesia Other Findings Past Medical History: No date: Diabetes mellitus without complication ([place]) No date: Erectile dysfunction No date: Gout No date: Heart murmur No date: Hyperlipidemia No date: Hypertension No date: Obesity No date: Osteoarthritis No date: Vitamin D deficiency  Reproductive/Obstetrics                            Anesthesia Physical Anesthesia Plan  ASA: II  Anesthesia Plan: General   Post-op Pain Management:    Induction: Intravenous  PONV Risk Score and Plan: 2 and Ondansetron, Propofol infusion and TIVA  Airway Management Planned: Nasal Cannula  Additional Equipment:  None  Intra-op Plan:   Post-operative Plan:   Informed Consent: I have reviewed the patients History and Physical, chart, labs and discussed the procedure including the risks, benefits and alternatives for the proposed anesthesia with the patient or authorized representative who has indicated his/her understanding and acceptance.     Dental advisory given  Plan Discussed with: CRNA and Surgeon  Anesthesia Plan Comments: (Discussed risks of anesthesia with patient, including possibility of difficulty with spontaneous ventilation under anesthesia necessitating airway intervention, PONV, and rare risks such as cardiac or respiratory or neurological events. Patient understands.)        Anesthesia Quick Evaluation

## 2019-08-24 NOTE — Op Note (Signed)
[facility] Gastroenterology Patient Name: Jeffery West Procedure Date: 08/24/2019 9:29 AM MRN: [id number] Account #: [id number] Date of Birth: 1956/04/07 Admit Type: Outpatient Age: 64 Room: [facility] ENDO ROOM 4 Gender: Male Note Status: Finalized Procedure:             Colonoscopy Indications:           Screening for colorectal malignant neoplasm Providers:             Lucilla Lame MD, MD Referring MD:          Janine Ores. Rosanna Randy, MD (Referring MD) Medicines:             Propofol per Anesthesia Complications:         No immediate complications. Procedure:             Pre-Anesthesia Assessment:                        - Prior to the procedure, a History and Physical was                         performed, and patient medications and allergies were                         reviewed. The patient's tolerance of previous                         anesthesia was also reviewed. The risks and benefits                         of the procedure and the sedation options and risks                         were discussed with the patient. All questions were                         answered, and informed consent was obtained. Prior                         Anticoagulants: The patient has taken no previous                         anticoagulant or antiplatelet agents. ASA Grade                         Assessment: II - A patient with mild systemic disease.                         After reviewing the risks and benefits, the patient                         was deemed in satisfactory condition to undergo the                         procedure.                        After obtaining informed consent, the colonoscope was  passed under direct vision. Throughout the procedure,                         the patient's blood pressure, pulse, and oxygen                         saturations were monitored continuously. The                         Colonoscope was introduced through the  anus and                         advanced to the the cecum, identified by appendiceal                         orifice and ileocecal valve. The colonoscopy was                         performed without difficulty. The patient tolerated                         the procedure well. The quality of the bowel                         preparation was excellent. Findings:      The perianal and digital rectal examinations were normal.      A 5 mm polyp was found in the sigmoid colon. The polyp was sessile. The       polyp was removed with a cold snare. Resection and retrieval were       complete.      Three sessile polyps were found in the ascending colon. The polyps were       3 to 5 mm in size. These polyps were removed with a cold snare.       Resection and retrieval were complete.      A 4 mm polyp was found in the transverse colon. The polyp was sessile.       The polyp was removed with a cold snare. Resection and retrieval were       complete.      Non-bleeding internal hemorrhoids were found during retroflexion. The       hemorrhoids were Grade I (internal hemorrhoids that do not prolapse). Impression:            - One 5 mm polyp in the sigmoid colon, removed with a                         cold snare. Resected and retrieved.                        - Three 3 to 5 mm polyps in the ascending colon,                         removed with a cold snare. Resected and retrieved.                        - One 4 mm polyp in the transverse colon, removed with  a cold snare. Resected and retrieved.                        - Non-bleeding internal hemorrhoids. Recommendation:        - Discharge patient to home.                        - Resume previous diet.                        - Continue present medications.                        - Repeat colonoscopy in 5 years if polyp adenoma and                         10 years if hyperplastic Procedure Code(s):     --- Professional ---                         [phone], Colonoscopy, flexible; with removal of                         tumor(s), polyp(s), or other lesion(s) by snare                         technique Diagnosis Code(s):     --- Professional ---                        Z12.11, Encounter for screening for malignant neoplasm                         of colon CPT copyright 2019 American Medical Association. All rights reserved. The codes documented in this report are preliminary and upon coder review may  be revised to meet current compliance requirements. Lucilla Lame MD, MD 08/24/2019 10:01:19 AM This report has been signed electronically. Number of Addenda: 0 Note Initiated On: 08/24/2019 9:29 AM Scope Withdrawal Time: 0 hours 10 minutes 5 seconds  Total Procedure Duration: 0 hours 17 minutes 57 seconds  Estimated Blood Loss:  Estimated blood loss: none.      [facility]

## 2019-08-24 NOTE — Transfer of Care (Signed)
Immediate Anesthesia Transfer of Care Note  Patient: Jeffery West  Procedure(s) Performed: COLONOSCOPY WITH PROPOFOL (N/A )  Patient Location: PACU  Anesthesia Type:General  Level of Consciousness: sedated  Airway & Oxygen Therapy: Patient Spontanous Breathing and Patient connected to nasal cannula oxygen  Post-op Assessment: Report given to RN and Post -op Vital signs reviewed and stable  Post vital signs: Reviewed and stable  Last Vitals:  Vitals Value Taken Time  BP 106/67 08/24/19 1003  Temp 36.8 C 08/24/19 1003  Pulse 94 08/24/19 1010  Resp 26 08/24/19 1010  SpO2 95 % 08/24/19 1010  Vitals shown include unvalidated device data.  Last Pain:  Vitals:   08/24/19 1003  TempSrc: Temporal  PainSc: Asleep         Complications: No apparent anesthesia complications

## 2019-08-27 ENCOUNTER — Encounter: Payer: Self-pay | Admitting: Gastroenterology

## 2019-08-27 ENCOUNTER — Encounter: Payer: Self-pay | Admitting: *Deleted

## 2019-08-27 LAB — SURGICAL PATHOLOGY

## 2019-08-28 ENCOUNTER — Other Ambulatory Visit: Payer: Self-pay

## 2019-09-28 ENCOUNTER — Other Ambulatory Visit: Payer: Self-pay

## 2019-09-28 ENCOUNTER — Ambulatory Visit (INDEPENDENT_AMBULATORY_CARE_PROVIDER_SITE_OTHER): Payer: 59

## 2019-09-28 DIAGNOSIS — I060 Rheumatic aortic stenosis: Secondary | ICD-10-CM | POA: Diagnosis not present

## 2019-09-28 MED ORDER — PERFLUTREN LIPID MICROSPHERE
1.0000 mL | INTRAVENOUS | Status: AC | PRN
  Administered 2019-09-28: 2 mL via INTRAVENOUS

## 2019-10-01 ENCOUNTER — Telehealth: Payer: Self-pay | Admitting: *Deleted

## 2019-10-01 NOTE — Telephone Encounter (Signed)
Left voicemail message for patient to call back for review of results  

## 2019-10-01 NOTE — Telephone Encounter (Signed)
-----   Message from Minna Merritts, MD sent at 09/30/2019 10:45 AM EDT ----- Aortic valve on echo now with severe stenosis, it has progressed in 4 years Heavily calcified Would recommend we meet again to discuss options

## 2019-10-01 NOTE — Telephone Encounter (Signed)
Patient calling to discuss recent echo testing results  ° °Please call  ° °

## 2019-10-02 ENCOUNTER — Encounter: Payer: Self-pay | Admitting: Cardiovascular Disease

## 2019-10-02 ENCOUNTER — Other Ambulatory Visit: Payer: Self-pay

## 2019-10-02 ENCOUNTER — Ambulatory Visit (INDEPENDENT_AMBULATORY_CARE_PROVIDER_SITE_OTHER): Payer: 59 | Admitting: Cardiovascular Disease

## 2019-10-02 VITALS — BP 130/70 | HR 91 | Ht 71.0 in | Wt 267.5 lb

## 2019-10-02 DIAGNOSIS — I060 Rheumatic aortic stenosis: Secondary | ICD-10-CM | POA: Diagnosis not present

## 2019-10-02 NOTE — Telephone Encounter (Signed)
Spoke with patient and reviewed provider results and recommendations. Scheduled him to come in later today to see provider to discuss plan of care. He was appreciative for the call with no further questions at this time.

## 2019-10-02 NOTE — Patient Instructions (Addendum)
Cardiac cath for aortic valve stenosis. Call on Thursday or Friday and ask for Olin Hauser to schedule  Medication Instructions:  No changes  If you need a refill on your cardiac medications before your next appointment, please call your pharmacy.    Lab work: CBC & BMP to be done prior to your procedure at the [facility]. No appointment is needed just check in at the registration desk.   2 days before your procedure you will need to drive thru the [facility] to have your COVID swab. Then you will need to go home and quarantine until your procedure day.    If you have labs (blood work) drawn today and your tests are completely normal, you will receive your results only by: Marland Kitchen MyChart Message (if you have MyChart) OR . A paper copy in the mail If you have any lab test that is abnormal or we need to change your treatment, we will call you to review the results.   Testing/Procedures: [facility] Cardiac Cath Instructions   You are scheduled for a Cardiac Cath on:_________________________  Please arrive at _______am on the day of your procedure  Please expect a call from our [facility] to pre-register you  Do not eat/drink anything after midnight  Someone will need to drive you home  It is recommended someone be with you for the first 24 hours after your procedure  Wear clothes that are easy to get on/off and wear slip on shoes if possible   Medications bring a current list of all medications with you  _XX__ You may take all of your medications the morning of your procedure with enough water to swallow safely  _XX__ Only take 15 units of Tresiba the night before  _XX__ Do not take these medications before your procedure: Metformin the morning of your procedure.   Day of your procedure: Arrive at the [facility] entrance.  Free valet service is available.  After entering the [facility] please check-in at the registration desk (1st desk on  your right) to receive your armband. After receiving your armband someone will escort you to the cardiac cath/special procedures waiting area.  The usual length of stay after your procedure is about 2 to 3 hours.  This can vary.  If you have any questions, please call our office at [phone], or you may call the cardiac cath lab at [facility] directly at [phone]    Follow-Up: At [facility], you and your health needs are our priority.  As part of our continuing mission to provide you with exceptional heart care, we have created designated Provider Care Teams.  These Care Teams include your primary Cardiologist (physician) and Advanced Practice Providers (APPs -  Physician Assistants and Nurse Practitioners) who all work together to provide you with the care you need, when you need it.  . You will need a follow up appointment in 1 month   . Providers on your designated Care Team:   . Murray Hodgkins, NP . Christell Faith, PA-C . Marrianne Mood, PA-C  Any Other Special Instructions Will Be Listed Below (If Applicable).  For educational health videos Log in to : [email or web address] Or : [email or web address], password : [email or web address]

## 2019-10-02 NOTE — Progress Notes (Signed)
Cardiology Office Note  Date:  10/02/2019   ID:  Jeffery West, DOB 1956-04-27, MRN [id number]  PCP:  Jeffery West., MD   Chief Complaint  Patient presents with  . OTHER    ABN Echo no complaints today. Meds reviewed verbally with pt.    HPI:  Jeffery West is a 64 year old gentleman with past medical history of Diabetes  HBA1C 9.9  up to 11.4 Smoker, remote HTN Hyperlipidemia Morbid obesity  aortic valve stenosis  on echocardiogram June 2017 Severe aortic valve stenosis Who presents for f/u of his aortic valve stenosis , cardiac risk factors  In follow-up today reports having periods of fatigue Chronic leg cramps etiology unclear Gout, "messed up ankles", moves slow Sedentary, weight has been running high Wife feels he has shortness of breath on exertion Denies chest pain  Discussed previous echocardiograms with him in detail Echo 11/2015 25 mm Hg mean gradient 324 peak velocity c/sec 42 peak gradient  Echo 2021 493 peak velocity cm/sec Mean gradient 53 mm Hg Peak gradient in the 90s  Lab work reviewed HBA1C 11.4 in Jan 2021 Total chol 227 LDL 120 Elevated AST/ALT  EKG personally reviewed by myself on todays visit Shows normal sinus rhythm rate 91 bpm nonspecific ST and T wave abnormality in lead I and aVL, incomplete right bundle branch block No significant change in EKG   PMH:   has a past medical history of Diabetes mellitus without complication ([place]), Erectile dysfunction, Gout, Heart murmur, Hyperlipidemia, Hypertension, Obesity, Osteoarthritis, and Vitamin D deficiency.  PSH:    Past Surgical History:  Procedure Laterality Date  . COLONOSCOPY WITH PROPOFOL N/A 08/24/2019   Procedure: COLONOSCOPY WITH PROPOFOL;  Surgeon: Lucilla Lame, MD;  Location: [facility] ENDOSCOPY;  Service: Endoscopy;  Laterality: N/A;  . NO PAST SURGERIES    . SHOULDER ARTHROSCOPY WITH OPEN ROTATOR CUFF REPAIR Right 07/26/2017   Procedure: SHOULDER ARTHROSCOPY WITH OPEN ROTATOR CUFF  REPAIR,SUBACROMINAL DECOMPRESSION,DISTAL CLAVICLE EXCISION;  Surgeon: Thornton Park, MD;  Location: [facility] ORS;  Service: Orthopedics;  Laterality: Right;    Current Outpatient Medications  Medication Sig Dispense Refill  . acetaminophen (TYLENOL) 500 MG tablet Take 1,000 mg by mouth every 6 (six) hours as needed for moderate pain or headache.    . albuterol (PROAIR HFA) 108 (90 Base) MCG/ACT inhaler Inhale 1 puff into the lungs every 6 (six) hours as needed for wheezing or shortness of breath. 18 g 3  . allopurinol (ZYLOPRIM) 300 MG tablet TAKE 1 TABLET BY MOUTH EVERY DAY 90 tablet 3  . BD PEN NEEDLE NANO U/F 32G X 4 MM MISC 1 EACH BY DOES NOT APPLY ROUTE DAILY. 100 each 11  . Carboxymethylcellulose Sodium (MOISTURIZING LUBRICANT EYE OP) Place 1 drop into both eyes daily as needed (for dry eyes).    . Cholecalciferol (VITAMIN D) 2000 units CAPS Take 2,000 Units by mouth every evening.     . ezetimibe (ZETIA) 10 MG tablet TAKE 1 TABLET BY MOUTH EVERY DAY 90 tablet 3  . glucose blood (ONETOUCH VERIO) test strip Use as instructed; BID 100 each 12  . losartan (COZAAR) 50 MG tablet TAKE 1 TABLET BY MOUTH EVERY DAY 90 tablet 2  . magnesium oxide (MAG-OX) 400 MG tablet TAKE 1 TABLET BY MOUTH TWICE A DAY 180 tablet 3  . meloxicam (MOBIC) 15 MG tablet Take 15 mg by mouth daily.     . metFORMIN (GLUCOPHAGE) 500 MG tablet TAKE 1 TABLET BY MOUTH TWICE A DAY WITH MEALS  180 tablet 3  . ONE TOUCH LANCETS MISC 1 Device by Does not apply route 2 (two) times daily. 60 each 11  . sildenafil (REVATIO) 20 MG tablet Take 1 tablet (20 mg total) by mouth 3 (three) times daily as needed. 90 tablet 1  . TRESIBA FLEXTOUCH 100 UNIT/ML SOPN FlexTouch Pen INJECT 30 UNITS TOTAL INTO THE SKIN DAILY AT 10 PM. 27 pen 1  . oxyCODONE (OXY IR/ROXICODONE) 5 MG immediate release tablet Take 1 tablet (5 mg total) by mouth every 4 (four) hours as needed for severe pain. (Patient not taking: Reported on 07/16/2019) 40 tablet 0  .  tamsulosin (FLOMAX) 0.4 MG CAPS capsule Take 1 capsule (0.4 mg total) by mouth daily. (Patient not taking: Reported on 10/02/2019) 30 capsule 2   No current facility-administered medications for this visit.     Allergies:   Penicillins   Social History:  The patient  reports that he has quit smoking. His smoking use included cigars. He has never used smokeless tobacco. He reports current alcohol use. He reports that he does not use drugs.   Family History:   family history includes Asthma in his mother; Cancer in his father.    Review of Systems: Review of Systems  Constitutional: Negative.   HENT: Negative.   Respiratory: Negative.   Cardiovascular: Negative.   Gastrointestinal: Negative.   Musculoskeletal: Negative.   Neurological: Negative.   Psychiatric/Behavioral: Negative.   All other systems reviewed and are negative.   PHYSICAL EXAM: VS:  BP 130/70 (BP Location: Left Arm, Patient Position: Sitting, Cuff Size: Normal)   Pulse 91   Ht 5\' 11"  (1.803 m)   Wt 267 lb 8 oz (121.3 kg)   SpO2 98%   BMI 37.31 kg/m  , BMI Body mass index is 37.31 kg/m. Constitutional:  oriented to person, place, and time. No distress.  HENT:  Head: Grossly normal Eyes:  no discharge. No scleral icterus.  Neck: No JVD, no carotid bruits  Cardiovascular: Regular rate and rhythm, no murmurs appreciated [id number] systolic ejection murmur right sternal border Pulmonary/Chest: Clear to auscultation bilaterally, no wheezes or rails Abdominal: Soft.  no distension.  no tenderness.  Musculoskeletal: Normal range of motion Neurological:  normal muscle tone. Coordination normal. No atrophy Skin: Skin warm and dry Psychiatric: normal affect, pleasant   Recent Labs: 07/20/2019: ALT 68; BUN 12; Creatinine, Ser 0.84; Hemoglobin 15.9; Platelets 131; Potassium 4.4; Sodium 137; TSH 1.970    Lipid Panel Lab Results  Component Value Date   CHOL 227 (H) 07/20/2019   HDL 34 (L) 07/20/2019   LDLCALC 120  (H) 07/20/2019   TRIG 414 (H) 07/20/2019      Wt Readings from Last 3 Encounters:  10/02/19 267 lb 8 oz (121.3 kg)  08/24/19 270 lb (122.5 kg)  08/13/19 270 lb (122.5 kg)      ASSESSMENT AND PLAN:  Poorly controlled type 2 diabetes mellitus ([place])  In the past was seen by Dr. Ronalee Belts Lifestyle modification recommended Medication compliance  Mixed hyperlipidemia Tolerating Zetia,  Off statin secondary to cramps Discussed his cramps with him again, discussed all types of remedies he has tried   Essential hypertension Blood pressure is well controlled on today's visit. No changes made to the medications.  Smoker Reports that he stop smoking   Severe aortic valve stenosis Recent echocardiogram findings discussed with him, all of the valve parameters have markedly increased consistent with severe stenosis Consistent with his exam --Discussed various treatment options for management  of his aortic valve Discussed there are several ways to fix his valve and we would recommend referral to the team in [facility] -Cardiac catheterization scheduled in preparation  he prefers to wait several weeks time, needs to talk to his boss at work about timing  I have reviewed the risks, indications, and alternatives to cardiac catheterization, possible angioplasty, and stenting with the patient. Risks include but are not limited to bleeding, infection, vascular injury, stroke, myocardial infection, arrhythmia, kidney injury, radiation-related injury in the case of prolonged fluoroscopy use, emergency cardiac surgery, and death. The patient understands the risks of serious complication is 1-2 in [id number] with diagnostic cardiac cath and 1-2% or less with angioplasty/stenting.      Total encounter time more than 45 minutes  Greater than 50% was spent in counseling and coordination of care with the patient  Disposition:   F/U  2 months   No orders of the defined types were placed in this  encounter.    Signed, Esmond Plants, M.D., Ph.D. 10/02/2019  [place], [place]

## 2019-10-02 NOTE — Telephone Encounter (Signed)
Patient calling back.   °

## 2019-10-04 ENCOUNTER — Telehealth: Payer: Self-pay | Admitting: Cardiovascular Disease

## 2019-10-04 DIAGNOSIS — I060 Rheumatic aortic stenosis: Secondary | ICD-10-CM

## 2019-10-04 DIAGNOSIS — I350 Nonrheumatic aortic (valve) stenosis: Secondary | ICD-10-CM

## 2019-10-04 NOTE — Telephone Encounter (Signed)
Spoke with patient wife re fu in 1 month   Patient wants to do cath on 4/30 .  Please call wife to confirm this date time and pre procedure instructions.  Please also call to discuss a referral to a cards provider in [place] office that was mentioned during visit.

## 2019-10-04 NOTE — Telephone Encounter (Signed)
Spoke with patients wife per release form. Left heart cath scheduled 10/19/19 at 09:30 AM with arrival time of 08:30AM at the [facility] Entrance. Instructed her to please have him go to have labs done on 10/17/19 then go to the [facility] building to have the COVID Swab done. She verbalized understanding of all instructions and had no further questions at this time.

## 2019-10-04 NOTE — Telephone Encounter (Signed)
Left voicemail message to call back about scheduling patients heart cath.

## 2019-10-07 NOTE — Telephone Encounter (Signed)
Can we place a referral to Dr. Roxy Manns, CT surgery How we proceed with valve surgery will depend on cath results end of the month Perhaps we can make Dr. Roxy Manns appt in May. Dr. Roxy Manns could discuss the various ways the valve could be managed (tavr/minimal invasive/open heart surg)

## 2019-10-08 ENCOUNTER — Other Ambulatory Visit: Payer: Self-pay | Admitting: Family Medicine

## 2019-10-08 MED ORDER — ALLOPURINOL 300 MG PO TABS: 300.0000 mg | ORAL_TABLET | Freq: Every day | ORAL | 3 refills | Status: DC

## 2019-10-08 NOTE — Telephone Encounter (Signed)
Spoke with patients wife per release form and reviewed provider message about placement of referral and that they may call to schedule appointment for sometime in May. She verbalized understanding with no further questions at this time.

## 2019-10-08 NOTE — Telephone Encounter (Signed)
Requested medication (s) are due for refill today: yes  Requested medication (s) are on the active medication list:yes  Last refill:  07/18/19 #90  0 refills  Pharmacy has never received order  Future visit scheduled:No  Notes to clinic: Uric acid needed    Requested Prescriptions  Pending Prescriptions Disp Refills   allopurinol (ZYLOPRIM) 300 MG tablet 90 tablet 3    Sig: Take 1 tablet (300 mg total) by mouth daily.      Endocrinology:  Gout Agents Failed - 10/08/2019  4:08 PM      Failed - Uric Acid in normal range and within 360 days    No results found for: POCURA, LABURIC        Failed - Valid encounter within last 12 months    Recent Outpatient Visits           2 months ago Annual physical exam   [facility] Jerrol Banana., MD   1 year ago Acute non-recurrent frontal sinusitis   [facility] [place], [place]   1 year ago Essential hypertension   [facility] Jerrol Banana., MD   1 year ago Poorly controlled type 2 diabetes mellitus [facility])   [facility] Jerrol Banana., MD   1 year ago Essential hypertension   [facility] Jerrol Banana., MD       Future Appointments             In 4 weeks Gollan, [name], MD [facility]            [place] in normal range and within 360 days    Creat  Date Value Ref Range Status  05/23/2017 0.77 0.70 - 1.25 mg/dL Final    Comment:    For patients >60 years of age, the reference limit for Creatinine is approximately 13% higher for people identified as African-American. .    Creatinine, Ser  Date Value Ref Range Status  07/20/2019 0.84 0.76 - 1.27 mg/dL Final

## 2019-10-08 NOTE — Telephone Encounter (Signed)
Medication Refill - Medication: allopurinol   Has the patient contacted their pharmacy? Yes.   (Agent: If no, request that the patient contact the pharmacy for the refill.) (Agent: If yes, when and what did the pharmacy advise?)  Preferred Pharmacy (with phone number or street name):  [id number] - [place], [place] [address]  [address] [place] [address]  Phone: [phone] Fax: [phone]  Not a 24 hour pharmacy; exact hours not known.     Agent: Please be advised that RX refills may take up to 3 business days. We ask that you follow-up with your pharmacy.

## 2019-10-09 NOTE — Telephone Encounter (Signed)
Patient wants to make sure his fu appt is not too far out from visit with Dr. Earlene Plater.  He isnt sure if these need to be closer together.

## 2019-10-09 NOTE — Telephone Encounter (Signed)
Left voicemail message that appointments were fine and did not need changing with instructions to call back if questions.

## 2019-10-10 ENCOUNTER — Other Ambulatory Visit: Payer: Self-pay | Admitting: Family Medicine

## 2019-10-10 DIAGNOSIS — N400 Enlarged prostate without lower urinary tract symptoms: Secondary | ICD-10-CM

## 2019-10-10 NOTE — Telephone Encounter (Signed)
Requested Prescriptions  Pending Prescriptions Disp Refills  . tamsulosin (FLOMAX) 0.4 MG CAPS capsule [Pharmacy Med Name: TAMSULOSIN HCL 0.4 MG CAPSULE] 90 capsule 3    Sig: TAKE 1 CAPSULE BY MOUTH EVERY DAY     Urology: Alpha-Adrenergic Blocker Failed - 10/10/2019  3:17 AM      Failed - Valid encounter within last 12 months    Recent Outpatient Visits          2 months ago Annual physical exam   [facility] Jeffery West., MD   1 year ago Acute non-recurrent frontal sinusitis   [facility] Jeffery West, Jeffery West   1 year ago Essential hypertension   [facility] Jeffery West., MD   1 year ago Poorly controlled type 2 diabetes mellitus [facility])   [facility] Jeffery West., MD   1 year ago Essential hypertension   [facility] Jeffery West., MD      Future Appointments            In 3 weeks Gollan, [name], MD [facility]           [place] BP in normal range    BP Readings from Last 1 Encounters:  10/02/19 130/70

## 2019-10-17 ENCOUNTER — Other Ambulatory Visit
Admission: RE | Admit: 2019-10-17 | Discharge: 2019-10-17 | Disposition: A | Discharge status: Home / Self Care | Payer: 59 | Source: Ambulatory Visit | Attending: Cardiovascular Disease | Admitting: Cardiovascular Disease

## 2019-10-17 ENCOUNTER — Other Ambulatory Visit: Payer: Self-pay | Admitting: Cardiovascular Disease

## 2019-10-17 ENCOUNTER — Telehealth: Payer: Self-pay | Admitting: *Deleted

## 2019-10-17 ENCOUNTER — Ambulatory Visit: Payer: Self-pay | Admitting: Family Medicine

## 2019-10-17 DIAGNOSIS — Z20822 Contact with and (suspected) exposure to covid-19: Secondary | ICD-10-CM | POA: Insufficient documentation

## 2019-10-17 DIAGNOSIS — Z01812 Encounter for preprocedural laboratory examination: Secondary | ICD-10-CM | POA: Diagnosis present

## 2019-10-17 DIAGNOSIS — I060 Rheumatic aortic stenosis: Secondary | ICD-10-CM

## 2019-10-17 LAB — BASIC METABOLIC PANEL
Anion gap: 9 (ref 5–15)
BUN: 17 mg/dL (ref 8–23)
CO2: 26 mmol/L (ref 22–32)
Calcium: 9.4 mg/dL (ref 8.9–10.3)
Chloride: 99 mmol/L (ref 98–111)
Creatinine, Ser: 0.9 mg/dL (ref 0.61–1.24)
GFR calc Af Amer: >60 mL/min (ref >60)
GFR calc non Af Amer: >60 mL/min (ref >60)
Glucose, Bld: 392 mg/dL — ABNORMAL HIGH (ref 70–99)
Potassium: 4.4 mmol/L (ref 3.5–5.1)
Sodium: 134 mmol/L — ABNORMAL LOW (ref 135–145)

## 2019-10-17 LAB — SARS CORONAVIRUS 2 (TAT 6-24 HRS): SARS Coronavirus 2: NEGATIVE

## 2019-10-17 LAB — CBC
HCT: 44.4 % (ref 39.0–52.0)
Hemoglobin: 15.4 g/dL (ref 13.0–17.0)
MCH: 30.8 pg (ref 26.0–34.0)
MCHC: 34.7 g/dL (ref 30.0–36.0)
MCV: 88.8 fL (ref 80.0–100.0)
Platelets: 127 10*3/uL — ABNORMAL LOW (ref 150–400)
RBC: 5 MIL/uL (ref 4.22–5.81)
RDW: 12.2 % (ref 11.5–15.5)
WBC: 5.7 10*3/uL (ref 4.0–10.5)
nRBC: 0 % (ref 0.0–0.2)

## 2019-10-17 NOTE — Telephone Encounter (Signed)
Spoke with patients wife per release form and confirmed arrival time for his heart cath on Friday at 6:30 AM. She stated that she would certainly contact patient relations about interaction with staff at registration desk. Reviewed that I would also make our administrator aware as well. Phone got disconnected and called back. Spoke to Mr. Paler and he confirmed all information with no questions at this time.

## 2019-10-17 NOTE — Telephone Encounter (Signed)
Called and spoke with patient to confirm if he was able to get his labs and COVID swab done today. He states that he went to [facility] and the lady at registration desk told him to go over to the drive thru for his labs and test. Patient was instructed to go over to [place] entrance for labs and then go to drive thru for COVID swab by our office. He reported this information to the lady at registration and she just kept telling him to go over to drive thru testing. I did check with PAT and they want patients to go to [facility] for those. Reassured him that we have been doing this process for some time and this has never happened. He verbalized understanding and acknowledged that it was not our mistake but the registration lady that got him so upset.

## 2019-10-17 NOTE — Addendum Note (Signed)
Addended by: Valora Corporal on: 10/17/2019 10:09 AM   Modules accepted: Orders

## 2019-10-19 ENCOUNTER — Encounter: Payer: Self-pay | Admitting: Cardiovascular Disease

## 2019-10-19 ENCOUNTER — Encounter
Admission: RE | Disposition: A | Discharge status: Home / Self Care | Payer: Self-pay | Source: Home / Self Care | Attending: Cardiovascular Disease

## 2019-10-19 ENCOUNTER — Ambulatory Visit
Admission: RE | Admit: 2019-10-19 | Discharge: 2019-10-19 | Disposition: A | Discharge status: Home / Self Care | Payer: No Typology Code available for payment source | Attending: Cardiovascular Disease | Admitting: Cardiovascular Disease

## 2019-10-19 ENCOUNTER — Other Ambulatory Visit: Payer: Self-pay

## 2019-10-19 DIAGNOSIS — R0602 Shortness of breath: Secondary | ICD-10-CM | POA: Diagnosis not present

## 2019-10-19 DIAGNOSIS — E782 Mixed hyperlipidemia: Secondary | ICD-10-CM | POA: Diagnosis not present

## 2019-10-19 DIAGNOSIS — Z87891 Personal history of nicotine dependence: Secondary | ICD-10-CM | POA: Insufficient documentation

## 2019-10-19 DIAGNOSIS — E559 Vitamin D deficiency, unspecified: Secondary | ICD-10-CM | POA: Diagnosis not present

## 2019-10-19 DIAGNOSIS — I25118 Atherosclerotic heart disease of native coronary artery with other forms of angina pectoris: Secondary | ICD-10-CM | POA: Diagnosis not present

## 2019-10-19 DIAGNOSIS — Z79899 Other long term (current) drug therapy: Secondary | ICD-10-CM | POA: Insufficient documentation

## 2019-10-19 DIAGNOSIS — I10 Essential (primary) hypertension: Secondary | ICD-10-CM | POA: Diagnosis not present

## 2019-10-19 DIAGNOSIS — Z6837 Body mass index (BMI) 37.0-37.9, adult: Secondary | ICD-10-CM | POA: Diagnosis not present

## 2019-10-19 DIAGNOSIS — I200 Unstable angina: Secondary | ICD-10-CM

## 2019-10-19 DIAGNOSIS — E119 Type 2 diabetes mellitus without complications: Secondary | ICD-10-CM | POA: Diagnosis not present

## 2019-10-19 DIAGNOSIS — I25110 Atherosclerotic heart disease of native coronary artery with unstable angina pectoris: Secondary | ICD-10-CM | POA: Diagnosis not present

## 2019-10-19 DIAGNOSIS — M109 Gout, unspecified: Secondary | ICD-10-CM | POA: Insufficient documentation

## 2019-10-19 DIAGNOSIS — I350 Nonrheumatic aortic (valve) stenosis: Secondary | ICD-10-CM | POA: Diagnosis not present

## 2019-10-19 DIAGNOSIS — Z794 Long term (current) use of insulin: Secondary | ICD-10-CM | POA: Insufficient documentation

## 2019-10-19 DIAGNOSIS — I060 Rheumatic aortic stenosis: Secondary | ICD-10-CM

## 2019-10-19 HISTORY — PX: LEFT HEART CATH AND CORONARY ANGIOGRAPHY: CATH118249

## 2019-10-19 HISTORY — PX: RIGHT HEART CATH: CATH118263

## 2019-10-19 LAB — GLUCOSE, CAPILLARY: Glucose-Capillary: 310 mg/dL — ABNORMAL HIGH (ref 70–99)

## 2019-10-19 SURGERY — LEFT HEART CATH AND CORONARY ANGIOGRAPHY
Anesthesia: Moderate Sedation

## 2019-10-19 SURGERY — RIGHT AND LEFT HEART CATH
Anesthesia: Moderate Sedation

## 2019-10-19 MED ORDER — FENTANYL CITRATE (PF) 100 MCG/2ML IJ SOLN
INTRAMUSCULAR | Status: AC
  Filled 2019-10-19: qty 2

## 2019-10-19 MED ORDER — HEPARIN (PORCINE) IN NACL 1000-0.9 UT/500ML-% IV SOLN
INTRAVENOUS | Status: DC | PRN
  Administered 2019-10-19: 500 mL

## 2019-10-19 MED ORDER — IOHEXOL 300 MG/ML  SOLN
INTRAMUSCULAR | Status: DC | PRN
  Administered 2019-10-19: 120 mL

## 2019-10-19 MED ORDER — MIDAZOLAM HCL 2 MG/2ML IJ SOLN
INTRAMUSCULAR | Status: DC | PRN
  Administered 2019-10-19 (×2): 1 mg via INTRAVENOUS

## 2019-10-19 MED ORDER — MIDAZOLAM HCL 2 MG/2ML IJ SOLN
INTRAMUSCULAR | Status: AC
  Filled 2019-10-19: qty 2

## 2019-10-19 MED ORDER — ASPIRIN 81 MG PO CHEW
CHEWABLE_TABLET | ORAL | Status: AC
  Filled 2019-10-19: qty 1

## 2019-10-19 MED ORDER — HEPARIN (PORCINE) IN NACL 1000-0.9 UT/500ML-% IV SOLN
INTRAVENOUS | Status: AC
  Filled 2019-10-19: qty 1000

## 2019-10-19 MED ORDER — FENTANYL CITRATE (PF) 100 MCG/2ML IJ SOLN
INTRAMUSCULAR | Status: DC | PRN
  Administered 2019-10-19: 50 ug via INTRAVENOUS
  Administered 2019-10-19 (×2): 25 ug via INTRAVENOUS

## 2019-10-19 MED ORDER — ASPIRIN 81 MG PO CHEW
81.0000 mg | CHEWABLE_TABLET | ORAL | Status: AC
  Administered 2019-10-19: 81 mg via ORAL

## 2019-10-19 MED ORDER — SODIUM CHLORIDE 0.9 % IV SOLN
INTRAVENOUS | Status: DC
  Administered 2019-10-19: 1000 mL via INTRAVENOUS

## 2019-10-19 SURGICAL SUPPLY — 12 items
CATH INFINITI 5FR ANG PIGTAIL (CATHETERS) IMPLANT
CATH INFINITI 5FR JL4 (CATHETERS) ×4 IMPLANT
CATH INFINITI JR4 5F (CATHETERS) ×4 IMPLANT
CATH SWANZ 7F THERMO (CATHETERS) ×4 IMPLANT
KIT MANI 3VAL PERCEP (MISCELLANEOUS) ×4 IMPLANT
NEEDLE PERC 18GX7CM (NEEDLE) ×4 IMPLANT
NEEDLE SMART REG 18GX2-3/4 (NEEDLE) ×4 IMPLANT
PACK CARDIAC CATH (CUSTOM PROCEDURE TRAY) ×4 IMPLANT
SHEATH AVANTI 5FR X 11CM (SHEATH) ×4 IMPLANT
SHEATH AVANTI 7FRX11 (SHEATH) ×4 IMPLANT
WIRE EMERALD ST .035X150CM (WIRE) ×4 IMPLANT
WIRE GUIDERIGHT .035X150 (WIRE) ×4 IMPLANT

## 2019-10-21 NOTE — H&P (Signed)
H&P Addendum, precardiac catheterization  Patient was seen and evaluated prior to Cardiac catheterization procedure Symptoms, prior testing details again confirmed with the patient Patient examined, no significant change from prior exam Lab work reviewed in detail personally by myself Patient understands risk and benefit of the procedure, willing to proceed  Signed, Tim Daphine Loch, MD, Ph.D [facility]    

## 2019-10-22 ENCOUNTER — Telehealth: Payer: Self-pay | Admitting: Cardiovascular Disease

## 2019-10-22 ENCOUNTER — Encounter: Payer: Self-pay | Admitting: *Deleted

## 2019-10-22 NOTE — Telephone Encounter (Signed)
Spoke with patients wife per release form and she reports that he went back to work today. He still has bandage from procedure in place and wife reports that they were not given any specific instructions for restrictions or about going back to work. Advised that I would review with provider and give her a call back with updates. Reviewed that they could remove that dressing and place band aide on that area with instructions to call if any bleeding. She verbalized understanding and states that he will be home later today. She did state that he needed letter for him being out of work from 4/28-4/30. Will make provider aware and call patient back with information.

## 2019-10-22 NOTE — Telephone Encounter (Signed)
Verbal discussion with Dr. Rockey Situ regarding patient dressing, work, and letter. Patient may remove dressing and will write letter for him to pick up. Will keep appointment until we see how visit goes with Dr. Cyndia Bent this week and we see the plan and if they move forward then we can cancel at that time. Will convey this information to the patient and/or his wife.

## 2019-10-22 NOTE — Telephone Encounter (Signed)
Patient returning my call from earlier and reviewed all information with him. He provided me with fax number [phone] to attention Christly Ovid Curd in [place] for his letter for being out for work. Also discussed that I would mail him letter as well. He verbalized understanding with no further questions at this time.

## 2019-10-22 NOTE — Telephone Encounter (Signed)
Patient would like a note stateing he was out of work for his procedure. Please call to discuss.

## 2019-10-22 NOTE — Telephone Encounter (Signed)
Spoke with patients wife per release form and reviewed provider recommendations to include dressing, work, letter, and appointment. Patient may remove dressing to that area, will provide letter for being out of work, and that we would leave appointment until his appointment with [facility]. She verbalized understanding and states that she will have him call me with fax number for letter. She had no further questions at this time.

## 2019-10-23 NOTE — Telephone Encounter (Signed)
Letter was faxed to number provided and placed in outgoing mail.

## 2019-10-24 ENCOUNTER — Other Ambulatory Visit: Payer: Self-pay

## 2019-10-24 ENCOUNTER — Institutional Professional Consult (permissible substitution) (INDEPENDENT_AMBULATORY_CARE_PROVIDER_SITE_OTHER): Payer: 59 | Admitting: Surgery

## 2019-10-24 ENCOUNTER — Encounter: Payer: Self-pay | Admitting: Surgery

## 2019-10-24 VITALS — BP 142/87 | HR 86 | Temp 98.4°F | Resp 20 | Ht 71.0 in | Wt 268.5 lb

## 2019-10-24 DIAGNOSIS — I350 Nonrheumatic aortic (valve) stenosis: Secondary | ICD-10-CM | POA: Diagnosis not present

## 2019-10-24 NOTE — Progress Notes (Signed)
Cardiothoracic Surgery Consultation   PCP is Jerrol Banana., MD Referring Provider is Minna Merritts, MD  Chief Complaint  Patient presents with  . Aortic Stenosis    Surgical eval, Cardiac Cath 10/19/19, ECHO 09/28/19    HPI:  The patient is a 64 year old gentleman with a history of obesity, hypertension, hyperlipidemia, poorly controlled diabetes, remote cigar smoking, and aortic stenosis that has been followed by periodic echocardiograms.  He had a 2D echocardiogram at [facility] in June 2017 that showed a mean aortic valve gradient of 25.5 mmHg.  His most recent echocardiogram on 09/28/2019 showed an increase in the aortic valve mean gradient to 53.5 mmHg.  Left ventricular ejection fraction was 60 to 65% with grade 1 diastolic dysfunction.  Cardiac catheterization on 10/19/2019 showed a 80% stenosis in the mid LAD.  There was 70% first diagonal stenosis.  The patient is here today with his wife.  They both report a history of exertional fatigue and shortness of breath which is progressed over the past 6 months or so.  He denies any chest discomfort.  He has had no dizziness or syncope.  He denies orthopnea and peripheral edema.  Past Medical History:  Diagnosis Date  . Diabetes mellitus without complication ([place])   . Erectile dysfunction   . Gout   . Heart murmur   . Hyperlipidemia   . Hypertension   . Obesity   . Osteoarthritis   . Vitamin D deficiency     Past Surgical History:  Procedure Laterality Date  . COLONOSCOPY WITH PROPOFOL N/A 08/24/2019   Procedure: COLONOSCOPY WITH PROPOFOL;  Surgeon: Lucilla Lame, MD;  Location: [facility] ENDOSCOPY;  Service: Endoscopy;  Laterality: N/A;  . LEFT HEART CATH AND CORONARY ANGIOGRAPHY Left 10/19/2019   Procedure: LEFT HEART CATH AND CORONARY ANGIOGRAPHY;  Surgeon: Minna Merritts, MD;  Location: [place] CV LAB;  Service: Cardiovascular;  Laterality: Left;  . NO PAST SURGERIES    . RIGHT HEART CATH N/A 10/19/2019   Procedure:  RIGHT HEART CATH;  Surgeon: Minna Merritts, MD;  Location: [place] [facility];  Service: Cardiovascular;  Laterality: N/A;  . SHOULDER ARTHROSCOPY WITH OPEN ROTATOR CUFF REPAIR Right 07/26/2017   Procedure: SHOULDER ARTHROSCOPY WITH OPEN ROTATOR CUFF REPAIR,SUBACROMINAL DECOMPRESSION,DISTAL CLAVICLE EXCISION;  Surgeon: Thornton Park, MD;  Location: [facility] ORS;  Service: Orthopedics;  Laterality: Right;    Family History  Problem Relation Age of Onset  . Asthma Mother   . Cancer Father        possibly in liver and lung    Social History Social History   Tobacco Use  . Smoking status: Former Smoker    Types: Cigars  . Smokeless tobacco: Never Used  . Tobacco comment: every once in a while, very rare.  Substance Use Topics  . Alcohol use: Yes    Alcohol/week: 0.0 standard drinks    Comment: social  . Drug use: No    Current Outpatient Medications  Medication Sig Dispense Refill  . acetaminophen (TYLENOL) 500 MG tablet Take 1,000 mg by mouth every 6 (six) hours as needed for moderate pain or headache.    . albuterol (PROAIR HFA) 108 (90 Base) MCG/ACT inhaler Inhale 1 puff into the lungs every 6 (six) hours as needed for wheezing or shortness of breath. 18 g 3  . allopurinol (ZYLOPRIM) 300 MG tablet Take 1 tablet (300 mg total) by mouth daily. 90 tablet 3  . BD PEN NEEDLE NANO U/F 32G X 4 MM  MISC 1 EACH BY DOES NOT APPLY ROUTE DAILY. 100 each 11  . Carboxymethylcellulose Sodium (MOISTURIZING LUBRICANT EYE OP) Place 1 drop into both eyes daily as needed (for dry eyes).    . Cholecalciferol (VITAMIN D) 2000 units CAPS Take 2,000 Units by mouth every evening.     . ezetimibe (ZETIA) 10 MG tablet TAKE 1 TABLET BY MOUTH EVERY DAY (Patient taking differently: Take 10 mg by mouth daily. ) 90 tablet 3  . glucose blood (ONETOUCH VERIO) test strip Use as instructed; BID 100 each 12  . losartan (COZAAR) 50 MG tablet TAKE 1 TABLET BY MOUTH EVERY DAY 90 tablet 2  . magnesium oxide (MAG-OX)  400 MG tablet TAKE 1 TABLET BY MOUTH TWICE A DAY (Patient taking differently: Take 400 mg by mouth 2 (two) times daily. ) 180 tablet 3  . meloxicam (MOBIC) 15 MG tablet Take 15 mg by mouth daily.     . metFORMIN (GLUCOPHAGE) 500 MG tablet TAKE 1 TABLET BY MOUTH TWICE A DAY WITH MEALS (Patient taking differently: Take 500 mg by mouth 2 (two) times daily with a meal. ) 180 tablet 3  . ONE TOUCH LANCETS MISC 1 Device by Does not apply route 2 (two) times daily. 60 each 11  . TRESIBA FLEXTOUCH 100 UNIT/ML SOPN FlexTouch Pen INJECT 30 UNITS TOTAL INTO THE SKIN DAILY AT 10 PM. (Patient taking differently: Inject 30 Units into the skin at bedtime. ) 27 pen 1  . sildenafil (REVATIO) 20 MG tablet Take 1 tablet (20 mg total) by mouth 3 (three) times daily as needed. (Patient not taking: Reported on 10/24/2019) 90 tablet 1  . tamsulosin (FLOMAX) 0.4 MG CAPS capsule TAKE 1 CAPSULE BY MOUTH EVERY DAY (Patient not taking: Reported on 10/19/2019) 90 capsule 3   No current facility-administered medications for this visit.    Allergies  Allergen Reactions  . Penicillins Hives, Swelling and Other (See Comments)    Has patient had a PCN reaction causing immediate rash, facial/tongue/throat swelling, SOB or lightheadedness with hypotension: Unknown Has patient had a PCN reaction causing severe rash involving mucus membranes or skin necrosis: No Has patient had a PCN reaction that required hospitalization: no Has patient had a PCN reaction occurring within the last 10 years: No If all of the above answers are "NO", then may proceed with Cephalosporin use.     Review of Systems  Constitutional: Positive for fatigue.  HENT: Negative.        Last saw his dentist a few months ago for cleaning.  Eyes: Negative.   Respiratory: Positive for shortness of breath.   Cardiovascular: Negative for chest pain, palpitations and leg swelling.  Gastrointestinal: Negative.   Endocrine: Negative.   Genitourinary: Positive for  frequency.  Musculoskeletal: Positive for arthralgias.       Leg cramps  Skin: Negative.   Allergic/Immunologic: Negative.   Neurological: Negative for dizziness and syncope.  Hematological: Negative.   Psychiatric/Behavioral: Negative.     BP (!) 142/87   Pulse 86   Temp 98.4 F (36.9 C) (Temporal)   Resp 20   Ht 5\' 11"  (1.803 m)   Wt 268 lb 8 oz (121.8 kg)   SpO2 94% Comment: RA  BMI 37.45 kg/m  Physical Exam Constitutional:      Appearance: Normal appearance. He is obese.  HENT:     Head: Normocephalic and atraumatic.  Eyes:     Extraocular Movements: Extraocular movements intact.     Pupils: Pupils are equal, round,  and reactive to light.  Neck:     Vascular: No carotid bruit.  Cardiovascular:     Rate and Rhythm: Normal rate and regular rhythm.     Heart sounds: Murmur present.     Comments: 3/6 systolic murmur along the right sternal border. Pulmonary:     Effort: Pulmonary effort is normal.     Breath sounds: Normal breath sounds.  Abdominal:     Comments: Obese and nontender  Musculoskeletal:        General: No swelling.     Cervical back: Normal range of motion.  Lymphadenopathy:     Cervical: No cervical adenopathy.  Skin:    General: Skin is warm and dry.  Neurological:     General: No focal deficit present.     Mental Status: He is alert and oriented to person, place, and time.  Psychiatric:        Mood and Affect: Mood normal.        Behavior: Behavior normal.        Thought Content: Thought content normal.        Judgment: Judgment normal.      Diagnostic Tests:  ECHOCARDIOGRAM REPORT       Patient Name:  JARAMIAH JUD Date of Exam: 09/28/2019  Medical Rec #: [id number]   Height:    71.5 in  Accession #:  [id number]  Weight:    270.0 lb  Date of Birth: 1955-09-20   BSA:     2.408 m  Patient Age:  60 years   BP:      130/72 mmHg  Patient Gender: M       HR:      88 bpm.  Exam Location:  [facility]   Procedure: 2D Echo, Cardiac Doppler, Color Doppler and Intracardiac       Opacification Agent   Indications:  I35.0 Nonrheumatic aortic (valve) stenosis    History:    Patient has prior history of Echocardiogram examinations,  most         recent 11/26/2015. Aortic Valve Disease,  Signs/Symptoms:Shortness         of Breath and Murmur; Risk Factors:Hypertension, Diabetes,         Dyslipidemia and Former Smoker. Patient c/o of mild SOB.    Sonographer:  Pilar Jarvis RDMS, RVT, RDCS  Referring Phys: [place]    1. Left ventricular ejection fraction, by estimation, is 60 to 65%. The  left ventricle has normal function. The left ventricle has no regional  wall motion abnormalities. Left ventricular diastolic parameters are  consistent with Grade I diastolic  dysfunction (impaired relaxation).  2. Right ventricular systolic function is normal. The right ventricular  size is normal.  3. Left atrial size was mildly dilated.  4. The aortic valve is severely calcified. Severe aortic valve stenosis.  Aortic valve area, by VTI measures 1.10 cm. Aortic valve mean gradient  measures 53.5 mmHg. Aortic valve Vmax measures 4.93 m/s.   FINDINGS  Left Ventricle: Left ventricular ejection fraction, by estimation, is 60  to 65%. The left ventricle has normal function. The left ventricle has no  regional wall motion abnormalities. Definity contrast agent was given IV  to delineate the left ventricular  endocardial borders. The left ventricular internal cavity size was normal  in size. There is no left ventricular hypertrophy. Left ventricular  diastolic parameters are consistent with Grade I diastolic dysfunction  (impaired relaxation).   Right Ventricle: The right ventricular  size is normal. No increase in  right ventricular wall thickness. Right ventricular systolic function is  normal.   Left Atrium:  Left atrial size was mildly dilated.   Right Atrium: Right atrial size was normal in size.   Pericardium: There is no evidence of pericardial effusion.   Mitral Valve: The mitral valve is normal in structure. Normal mobility of  the mitral valve leaflets. Mild mitral valve regurgitation. No evidence of  mitral valve stenosis.   Tricuspid Valve: The tricuspid valve is normal in structure. Tricuspid  valve regurgitation is not demonstrated. No evidence of tricuspid  stenosis.   Aortic Valve: The aortic valve is normal in structure. Aortic valve  regurgitation is not visualized. Severe aortic stenosis is present. Aortic  valve mean gradient measures 53.5 mmHg. Aortic valve peak gradient  measures 97.2 mmHg. Aortic valve area, by  VTI measures 1.10 cm.   Pulmonic Valve: The pulmonic valve was normal in structure. Pulmonic valve  regurgitation is not visualized. No evidence of pulmonic stenosis.   Aorta: The aortic root is normal in size and structure.   Venous: The inferior vena cava is normal in size with greater than 50%  respiratory variability, suggesting right atrial pressure of 3 mmHg.   IAS/Shunts: No atrial level shunt detected by color flow Doppler.     LEFT VENTRICLE  PLAX 2D  LVIDd:     4.40 cm Diastology  LVIDs:     2.50 cm LV e' lateral:  6.09 cm/s  LV PW:     1.30 cm LV E/e' lateral: 14.4  LV IVS:    1.20 cm LV e' medial:  6.09 cm/s  LVOT diam:   2.10 cm LV E/e' medial: 14.4  LV SV:     104  LV SV Index:  43  LVOT Area:   3.46 cm     RIGHT VENTRICLE       IVC  RV Basal diam: 4.60 cm   IVC diam: 2.30 cm  RV S prime:   16.40 cm/s  TAPSE (M-mode): 3.3 cm   LEFT ATRIUM       Index  LA diam:    4.10 cm 1.70 cm/m  LA Vol (A2C):  53.7 ml 22.30 ml/m  LA Vol (A4C):  47.9 ml 19.89 ml/m  LA Biplane Vol: 55.9 ml 23.22 ml/m  AORTIC VALVE          PULMONIC VALVE  AV Area (Vmax):  0.91 cm    PV Vmax:    1.26 m/s  AV Area (Vmean):  1.00 cm   PV Peak grad: 6.4 mmHg  AV Area (VTI):   1.10 cm  AV Vmax:      493.00 cm/s  AV Vmean:     345.500 cm/s  AV VTI:      0.944 m  AV Peak Grad:   97.2 mmHg  AV Mean Grad:   53.5 mmHg  LVOT Vmax:     130.00 cm/s  LVOT Vmean:    99.800 cm/s  LVOT VTI:     0.300 m  LVOT/AV VTI ratio: 0.32    AORTA  Ao Asc diam: 3.30 cm  Ao Arch diam: 3.1 cm   MITRAL VALVE  MV Area (PHT): 1.89 cm   SHUNTS  MV Decel Time: 402 msec   Systemic VTI: 0.30 m  MV E velocity: 87.40 cm/s  Systemic Diam: 2.10 cm  MV A velocity: 104.00 cm/s  MV E/A ratio: 0.84   [name] MD  Electronically signed  by [name] MD  Signature Date/Time: 09/28/2019/7:49:43 PM     Physicians Panel Physicians Referring Physician Case Authorizing Physician  Minna Merritts, MD (Primary)       Procedures LEFT HEART CATH AND CORONARY ANGIOGRAPHY  RIGHT HEART CATH     Conclusion Mid LAD-1 lesion is 40% stenosed.  Mid LAD-2 lesion is 80% stenosed.  1st Diag lesion is 70% stenosed.  The left ventricular systolic function is normal.  LV end diastolic pressure is normal.  The left ventricular ejection fraction is 55-65% by visual estimate.  There is no mitral valve regurgitation.  There is severe aortic valve stenosis.           Indications Severe aortic stenosis [I35.0 (ICD-10-CM)]     Procedural Details Technical Details Cardiac Catheterization Procedure Note  Name: ZEF BRUSSO MRN: [id number] DOB: Apr 27, 1956  Procedure: Left Heart Cath, Selective Coronary Angiography, LV angiography, right heart catheterization Indication:  Mr Okon is a 64 year old gentleman with past medical history of Diabetes HBA1C 9.9 up to 11.4 Smoker, remote HTN Hyperlipidemia Morbid obesity  Severe aortic valve stenosis Who presents for right and left heart catheterization in preparation for aortic valve surgery,  shortness of breath on exertion  Procedural details: The right groin was prepped, draped, and anesthetized with 1% lidocaine. Using modified Seldinger technique, a 5 French sheath was introduced into the right femoral artery. Standard Judkins catheters (JL 4, JR 4 and pigtail catheter, right heart catheterization) were used for coronary angiography and left ventriculography. Catheter exchanges were performed over a guidewire. There were no immediate procedural complications. The patient was transferred to the post catheterization recovery area for further monitoring.  Moderate sedation: 1. Sedation used: 2 mg Versed 100 mcg fentanyl 2. Time of administration: 8:30 AM Time patient left for recovery 9:15 AM Total sedation time 45 minutes 3. I was Face to Face with the patient during this time: (code: [phone])   Procedural Findings:   Coronary angiography:  Coronary dominance: Right  Left mainstem: Large vessel that bifurcates into the LAD and left circumflex, no significant disease noted  Left anterior descending (LAD): Large vessel that extends to the apical region, diagonal branch 2 of moderate size, 40% proximal LAD disease, 80% stenosis of the LAD at the takeoff of D2, distal vessel with some diffuse disease, first diagonal with moderate to severe proximal disease estimated 70%  Left circumflex (LCx): Large vessel with OM branch 2, no significant disease noted  Right coronary artery (RCA): Right dominant vessel with PL and PDA, no significant disease noted  Left ventriculography: Left ventricular systolic function is normal, LVEF is estimated at 55%, unable to exclude mild hypokinesis anterior wall, there is no significant mitral regurgitation ,   Severe aortic valve stenosis, gradient on pullback estimated at mean 52 mmHg  Right heart pressures RA mean of 3 RV 31/1 end 4 PA 186/14 mean 17 Wedge mean 12 LV 169/0 end 11 Ao 105/62 mean 82  Final Conclusions:  Severe disease of  mid LAD, proximal D1 Severe aortic valve stenosis   Recommendations:  Results discussed with patient, will recommend referral to TAVR/CT surgery service for further discussion Given diabetes, may be a candidate for CABG and valve replacement Patient could meet with TAVR team to determine if he might be a candidate for hybrid approach  [name] 10/19/2019, 9:25 AM  Estimated blood loss <50 mL.   During this procedure medications were administered to achieve and maintain moderate conscious sedation while the patient's heart rate,  blood pressure, and oxygen saturation were continuously monitored and I was present face-to-face 100% of this time.     Medications (Filter: Administrations occurring from 10/19/19 0751 to 10/19/19 0918)  Continuous medications are totaled by the amount administered until 10/19/19 0918.  midazolam (VERSED) injection (mg) Total dose: 2 mg  Date/Time   Rate/Dose/Volume Action  10/19/19 0810  1 mg Given  0812  1 mg Given  fentaNYL (SUBLIMAZE) injection (mcg) Total dose: 100 mcg  Date/Time   Rate/Dose/Volume Action  10/19/19 0810  50 mcg Given  0812  25 mcg Given  0857  25 mcg Given  Heparin (Porcine) in NaCl 1000-0.9 UT/500ML-% SOLN (mL) Total volume: 500 mL  Date/Time   Rate/Dose/Volume Action  10/19/19 0839  500 mL Given  iohexol (OMNIPAQUE) 300 MG/ML solution (mL) Total volume: 120 mL  Date/Time   Rate/Dose/Volume Action  10/19/19 0907  120 mL Given     Sedation Time Sedation Time Physician-1: 48 minutes 56 seconds        Contrast Medication Name Total Dose  iohexol (OMNIPAQUE) 300 MG/ML solution 120 mL     Radiation/Fluoro Fluoro time: 8.5 (min)  DAP: 48.7 (Gycm2)  Cumulative Air Kerma: 618 (mGy)        Coronary Findings Diagnostic Dominance: Right  Left Anterior Descending  Mid LAD-1 lesion 40% stenosed  Mid LAD-1 lesion is 40% stenosed.  Mid LAD-2 lesion 80% stenosed  Mid LAD-2 lesion is 80% stenosed.   First  Diagonal Branch  1st Diag lesion 70% stenosed  1st Diag lesion is 70% stenosed.  Intervention No interventions have been documented.                      Wall Motion Resting               The mid inferior, basilar anterior, basilar inferior, apical anterior and apical inferior segments are normal. The mid anterior segment is hypokinetic.                Left Heart Left Ventricle The left ventricular size is normal. The left ventricular systolic function is normal. LV end diastolic pressure is normal. The left ventricular ejection fraction is 55-65% by visual estimate. No regional wall motion abnormalities. There is no evidence of mitral regurgitation.   Aortic Valve There is severe aortic valve stenosis. The aortic valve is calcified. There is restricted aortic valve motion.     Coronary Diagrams Diagnostic Dominance: Right  &&&&&  Intervention      Implants  Vascular Products  Device Closure Mynxgrip 45f [phone] - Not Implanted  Inventory item: DEVICE CLOSURE MYNXGRIP 72F Model/Cat number: [id number]  Manufacturer: [employer] Lot number: [id number]  Device identifier: [id number] Device identifier type: GS1  Area Of Implantation: Groin    GUDID Information   Request status Successful     Brand name: [facility] Version/Model: [id number]   Company name: [employer]. MRI safety info as of 10/19/19: MR Safe   Contains dry or latex rubber: No     GMDN P.T. name: Wound hydrogel dressing, non-antimicrobial     As of 10/19/2019    Status: Returned          [employer] Link to Procedure Log  Show images for CARDIAC CATHETERIZATION Procedure Log     Images on [employer] Storage   Show images for Roberg, Brett Illes      Hemo Data (last day) before discharge   AO Systolic Cath Pressure  AO Diastolic Cath Pressure   AO Mean Cath Pressure   LV Systolic Cath Pressure   LV End Diastolic   LV Systolic   LV End Diastolic   LV dP/dt   PA Systolic Cath  Pressure   PA Diastolic Cath Pressure   PA Mean Cath Pressure   RA Wedge A Wave   RA Wedge V Wave   RV Systolic Cath Pressure   RV Diastolic Cath Pressure   RV End Diastolic   RV Systolic   RV End Diastolic   RV dP/dt   PCW A Wave   PCW V Wave   PCW Mean   AO O2 Sat   PA O2 Sat   AO O2 Sat   Fick C.O.   Fick C.I.   --  --  --  --  --  169 mmHg  11 mmHg  2304 mmHg/sec  --  --  --  6 mmHg  5 mmHg  --  --  --  31 mmHg  4 mmHg  480 mmHg/sec  16 mmHg  19 mmHg  12 mmHg  --  --  --  6.11 L/min  2.57 L/min/m2   --  --  --  --  --  --  --  --  26 mmHg  14 mmHg  17 mmHg  --  --  --  --  --  --  --  --  --  --  --  --  --  --  --  --   --  --  --  --  --  --  --  --  --  --  --  --  --  31 mmHg  1 mmHg  4 mmHg  --  --  --  --  --  --  --  --  --  --  --   112  63 mmHg  84 mmHg  --  --  --  --  --  --  --  --  --  --  --  --  --  --  --  --  --  --  --  --  --  --  --  --   --  --  --  --  --  --  --  --  --  --  --  --  --  --  --  --  --  --  --  --  --  --  97.2 %  --  SA  --  --   --  --  --  --  --  --  --  --  --  --  --  --  --  --  --  --  --  --  --  --  --  --  --  PA  --  --  --   112  61 mmHg  84 mmHg  --  --  --  --  --  --  --  --  --  --  --  --  --  --  --  --  --  --  --  --  --  --  --  --   --  --  --  174 mmHg  14 mmHg  --  --  --  --  --  --  --  --  --  --  --  --  --  --  --  --  --  --  --  --  --  --   --  --  --  180 mmHg  15 mmHg  --  --  --  --  --  --  --  --  --  --  --  --  --  --  --  --  --  --  --  --  --  --   --  --  --  169 mmHg  11 mmHg  --  --  --  --  --  --  --  --  --  --  --  --  --  --  --  --  --  --  --  --  --  --   105  62 mmHg  82 mmHg                                                     Impression:  This 64 year old diabetic gentleman has stage D, critical aortic stenosis and severe single-vessel coronary disease with New York Heart Association class II symptoms of exertional fatigue and shortness of breath.  I have personally reviewed his 2D  echocardiogram and cardiac catheterization images.  His echocardiogram shows a heavily calcified aortic valve with restricted mobility.  The mean gradient is 53.5 mmHg consistent with critical aortic stenosis.  It is not possible to tell how many leaflets there are.  The aorta is of normal size.  Cardiac catheterization shows severe single-vessel coronary disease with an 80% mid LAD stenosis and a 70% first diagonal stenosis.  I think coronary bypass graft surgery and aortic valve replacement is the best treatment for this gentleman given his relatively young age and presence of poorly controlled diabetes.  TAVR is not recommended for patients less than 46 years of age due to lack of long-term durability data.  I reviewed the echocardiogram and cardiac catheterization images with the patient and his wife and answered all their questions.  I discussed the alternatives of mechanical and bioprosthetic valves.  We reviewed the pros and cons of both and he and his wife feel that a bioprosthetic valve would be the best option for him so that he does not need to be on Coumadin.  He has significant arthritis and takes nonsteroidal anti-inflammatory agents on a routine basis to remain functional.  I think the current generation of pericardial bioprosthesis will have good long-term durability for him.  I discussed the operative procedure with the patient and his wife including alternatives, benefits and risks; including but not limited to bleeding, blood transfusion, infection, stroke, myocardial infarction, graft failure, heart block requiring a permanent pacemaker, organ dysfunction, and death.  [name] understands and agrees to proceed.    Plan:  He would like to go home and think about the timing of surgery but is inclined to proceed in the first week of June.  He will call us back to schedule aortic valve replacement using a bioprosthetic valve and coronary bypass graft surgery to the diagonal and LAD.  I  spent 60 minutes performing this consultation and > 50% of this time was spent face to face counseling and coordinating the care of this patient's critical aortic stenosis and single-vessel coronary disease.  Gaye Pollack, MD [facility] [phone]

## 2019-10-25 ENCOUNTER — Encounter: Payer: Self-pay | Admitting: *Deleted

## 2019-10-25 ENCOUNTER — Other Ambulatory Visit: Payer: Self-pay | Admitting: *Deleted

## 2019-10-25 DIAGNOSIS — I2510 Atherosclerotic heart disease of native coronary artery without angina pectoris: Secondary | ICD-10-CM

## 2019-10-25 DIAGNOSIS — I350 Nonrheumatic aortic (valve) stenosis: Secondary | ICD-10-CM

## 2019-10-26 NOTE — Telephone Encounter (Signed)
Patient wife is calling, would like to know if he needs to see Dr. Rockey Situ, since he has upcoming procedures by Dr. Cyndia Bent. Please call to discuss.

## 2019-10-26 NOTE — Telephone Encounter (Signed)
No answer. Left detailed message, ok per DPR, saying that it would be beneficial to keep the f/u appointment with Dr Rockey Situ if that's ok with them to evaluate post cath. If they have any further questions, to please give [place] a call back.

## 2019-10-29 ENCOUNTER — Telehealth: Payer: Self-pay | Admitting: Cardiovascular Disease

## 2019-10-29 NOTE — Telephone Encounter (Signed)
4/30 cath done patient continues to have pink spotting at site   Also doesn't see the need for a fu visit as scheduled with [place]  since he has seen cv surgeon and has plans for a procedure.    Please call

## 2019-10-29 NOTE — Telephone Encounter (Signed)
Spoke with patient and he still has some pink spots but no swelling with just noted bruising. No active bleeding from site and only notices pink spot on bandages when he changes those. He reports that the site is red but is starting to look better. He confirmed upcoming appointment on the 15th and wanted to cancel. Encouraged that he please keep this appointment so that provider can assess the site and make sure that it is healing. He verbalized understanding, was agreeable to keep appointment, and had no further questions at this time.

## 2019-10-31 NOTE — Telephone Encounter (Signed)
Spoke with patients wife per release form and patient already has appointment scheduled for next week after his working hours. Confirmed appointment time with her and she was appreciative for the follow up call with appointment information.

## 2019-10-31 NOTE — Telephone Encounter (Signed)
He does not need to pay a co-pay to have his leg looked at we can squeeze him in anytime You can put him on my schedule I can look at his leg We can talk on Thursday morning to arrange

## 2019-11-06 ENCOUNTER — Encounter: Payer: Self-pay | Admitting: Cardiovascular Disease

## 2019-11-06 ENCOUNTER — Other Ambulatory Visit: Payer: Self-pay

## 2019-11-06 ENCOUNTER — Ambulatory Visit (INDEPENDENT_AMBULATORY_CARE_PROVIDER_SITE_OTHER): Payer: 59 | Admitting: Cardiovascular Disease

## 2019-11-06 VITALS — BP 130/80 | HR 88 | Ht 71.0 in | Wt 190.0 lb

## 2019-11-06 DIAGNOSIS — I060 Rheumatic aortic stenosis: Secondary | ICD-10-CM

## 2019-11-06 MED ORDER — ASPIRIN EC 81 MG PO TBEC
81.0000 mg | DELAYED_RELEASE_TABLET | Freq: Every day | ORAL | 3 refills | Status: DC
Start: 2019-11-06 — End: 2019-12-02

## 2019-11-06 MED ORDER — ROSUVASTATIN CALCIUM 5 MG PO TABS
5.0000 mg | ORAL_TABLET | Freq: Every day | ORAL | 3 refills | Status: DC
Start: 2019-11-06 — End: 2020-10-20

## 2019-11-06 NOTE — Progress Notes (Signed)
Seen in the office today after phone call concerning some concerned about his right groin access site for catheterization  Right groin evaluated, appears to be healing well, no discharge Overall feels well, is going on vacation next week Scheduled for surgery in 3 weeks time  Recommend he start low-dose aspirin 81 mg daily, Crestor 5 mg daily continue his Zetia  Signed, Esmond Plants, MD, Ph.D [facility]

## 2019-11-06 NOTE — Patient Instructions (Signed)
Medication Instructions:  No changes  If you need a refill on your cardiac medications before your next appointment, please call your pharmacy.    Lab work: No new labs needed   If you have labs (blood work) drawn today and your tests are completely normal, you will receive your results only by: Marland Kitchen MyChart Message (if you have MyChart) OR . A paper copy in the mail If you have any lab test that is abnormal or we need to change your treatment, we will call you to review the results.   Testing/Procedures: No new testing needed   Follow-Up: At [facility], you and your health needs are our priority.  As part of our continuing mission to provide you with exceptional heart care, we have created designated Provider Care Teams.  These Care Teams include your primary Cardiologist (physician) and Advanced Practice Providers (APPs -  Physician Assistants and Nurse Practitioners) who all work together to provide you with the care you need, when you need it.  . You will need a follow up appointment in 6 to 8 weeks   . Providers on your designated Care Team:   . Murray Hodgkins, NP . Christell Faith, PA-C . Marrianne Mood, PA-C  Any Other Special Instructions Will Be Listed Below (If Applicable).  For educational health videos Log in to : [email or web address] Or : [email or web address], password : [email or web address]

## 2019-11-12 DIAGNOSIS — Z736 Limitation of activities due to disability: Secondary | ICD-10-CM

## 2019-11-20 ENCOUNTER — Other Ambulatory Visit: Payer: Self-pay | Admitting: Family Medicine

## 2019-11-20 DIAGNOSIS — E119 Type 2 diabetes mellitus without complications: Secondary | ICD-10-CM

## 2019-11-20 NOTE — Telephone Encounter (Signed)
Requested Prescriptions  Pending Prescriptions Disp Refills  . BD PEN NEEDLE NANO 2ND GEN 32G X 4 MM MISC [Pharmacy Med Name: BD NANO 2 GEN PEN NDL 32GX4MM] 100 each 11    Sig: USE AS DIRECTED     Endocrinology: Diabetes - Testing Supplies Failed - 11/20/2019  7:37 PM      Failed - Valid encounter within last 12 months    Recent Outpatient Visits          4 months ago Annual physical exam   [facility] Jerrol Banana., MD   1 year ago Acute non-recurrent frontal sinusitis   [facility] [place], [place]   1 year ago Essential hypertension   [facility] Jerrol Banana., MD   1 year ago Poorly controlled type 2 diabetes mellitus [facility])   [facility] Jerrol Banana., MD   2 years ago Essential hypertension   [facility] Jerrol Banana., MD      Future Appointments            In 1 month Gollan, [name], MD [facility], [place]

## 2019-11-21 ENCOUNTER — Other Ambulatory Visit: Payer: Self-pay | Admitting: Family Medicine

## 2019-11-21 NOTE — Telephone Encounter (Signed)
[facility] in [place] is requesting refills on BD Nano 2 Gen Pen Needles

## 2019-11-21 NOTE — Telephone Encounter (Signed)
Sent to pharmacy yesterday

## 2019-11-22 ENCOUNTER — Ambulatory Visit (HOSPITAL_COMMUNITY)
Admission: RE | Admit: 2019-11-22 | Discharge: 2019-11-22 | Disposition: A | Discharge status: Home / Self Care | Payer: No Typology Code available for payment source | Source: Ambulatory Visit | Attending: Surgery | Admitting: Surgery

## 2019-11-22 ENCOUNTER — Other Ambulatory Visit: Payer: Self-pay

## 2019-11-22 ENCOUNTER — Encounter (HOSPITAL_COMMUNITY): Payer: Self-pay

## 2019-11-22 ENCOUNTER — Encounter (HOSPITAL_COMMUNITY)
Admission: RE | Admit: 2019-11-22 | Discharge: 2019-11-22 | Disposition: A | Discharge status: Home / Self Care | Payer: No Typology Code available for payment source | Source: Ambulatory Visit | Attending: Surgery | Admitting: Surgery

## 2019-11-22 ENCOUNTER — Other Ambulatory Visit (HOSPITAL_COMMUNITY)
Admission: RE | Admit: 2019-11-22 | Discharge: 2019-11-22 | Disposition: A | Discharge status: Home / Self Care | Payer: No Typology Code available for payment source | Source: Ambulatory Visit | Attending: Surgery | Admitting: Surgery

## 2019-11-22 DIAGNOSIS — F17200 Nicotine dependence, unspecified, uncomplicated: Secondary | ICD-10-CM | POA: Diagnosis not present

## 2019-11-22 DIAGNOSIS — I2510 Atherosclerotic heart disease of native coronary artery without angina pectoris: Secondary | ICD-10-CM

## 2019-11-22 DIAGNOSIS — E785 Hyperlipidemia, unspecified: Secondary | ICD-10-CM | POA: Insufficient documentation

## 2019-11-22 DIAGNOSIS — I350 Nonrheumatic aortic (valve) stenosis: Secondary | ICD-10-CM | POA: Diagnosis not present

## 2019-11-22 DIAGNOSIS — E119 Type 2 diabetes mellitus without complications: Secondary | ICD-10-CM | POA: Insufficient documentation

## 2019-11-22 DIAGNOSIS — I10 Essential (primary) hypertension: Secondary | ICD-10-CM | POA: Insufficient documentation

## 2019-11-22 DIAGNOSIS — Z20822 Contact with and (suspected) exposure to covid-19: Secondary | ICD-10-CM | POA: Diagnosis not present

## 2019-11-22 DIAGNOSIS — Z01818 Encounter for other preprocedural examination: Secondary | ICD-10-CM | POA: Diagnosis not present

## 2019-11-22 LAB — BLOOD GAS, ARTERIAL
Acid-base deficit: 0.3 mmol/L (ref 0.0–2.0)
Bicarbonate: 23.9 mmol/L (ref 20.0–28.0)
FIO2: 21
O2 Saturation: 97.9 %
Patient temperature: 37
pCO2 arterial: 39.8 mmHg (ref 32.0–48.0)
pH, Arterial: 7.396 (ref 7.350–7.450)
pO2, Arterial: 100 mmHg (ref 83.0–108.0)

## 2019-11-22 LAB — COMPREHENSIVE METABOLIC PANEL
ALT: 54 U/L — ABNORMAL HIGH (ref 0–44)
AST: 53 U/L — ABNORMAL HIGH (ref 15–41)
Albumin: 3.8 g/dL (ref 3.5–5.0)
Alkaline Phosphatase: 83 U/L (ref 38–126)
Anion gap: 10 (ref 5–15)
BUN: 15 mg/dL (ref 8–23)
CO2: 21 mmol/L — ABNORMAL LOW (ref 22–32)
Calcium: 9.5 mg/dL (ref 8.9–10.3)
Chloride: 102 mmol/L (ref 98–111)
Creatinine, Ser: 0.8 mg/dL (ref 0.61–1.24)
GFR calc Af Amer: >60 mL/min (ref >60)
GFR calc non Af Amer: >60 mL/min (ref >60)
Glucose, Bld: 333 mg/dL — ABNORMAL HIGH (ref 70–99)
Potassium: 4.8 mmol/L (ref 3.5–5.1)
Sodium: 133 mmol/L — ABNORMAL LOW (ref 135–145)
Total Bilirubin: 0.7 mg/dL (ref 0.3–1.2)
Total Protein: 6.7 g/dL (ref 6.5–8.1)

## 2019-11-22 LAB — APTT: aPTT: 26 seconds (ref 24–36)

## 2019-11-22 LAB — CBC
HCT: 45.1 % (ref 39.0–52.0)
Hemoglobin: 15.2 g/dL (ref 13.0–17.0)
MCH: 30.8 pg (ref 26.0–34.0)
MCHC: 33.7 g/dL (ref 30.0–36.0)
MCV: 91.5 fL (ref 80.0–100.0)
Platelets: 124 10*3/uL — ABNORMAL LOW (ref 150–400)
RBC: 4.93 MIL/uL (ref 4.22–5.81)
RDW: 12.4 % (ref 11.5–15.5)
WBC: 6.1 10*3/uL (ref 4.0–10.5)
nRBC: 0 % (ref 0.0–0.2)

## 2019-11-22 LAB — URINALYSIS, ROUTINE W REFLEX MICROSCOPIC
Bilirubin Urine: NEGATIVE
Glucose, UA: >=500 mg/dL — AB
Hgb urine dipstick: NEGATIVE
Ketones, ur: NEGATIVE mg/dL
Leukocytes,Ua: NEGATIVE
Nitrite: NEGATIVE
Protein, ur: NEGATIVE mg/dL
Specific Gravity, Urine: 1.03 (ref 1.005–1.030)
pH: 5 (ref 5.0–8.0)

## 2019-11-22 LAB — PROTIME-INR
INR: 1 (ref 0.8–1.2)
Prothrombin Time: 12.5 seconds (ref 11.4–15.2)

## 2019-11-22 LAB — TYPE AND SCREEN
ABO/RH(D): O POS
Antibody Screen: NEGATIVE

## 2019-11-22 LAB — ABO/RH: ABO/RH(D): O POS

## 2019-11-22 LAB — SURGICAL PCR SCREEN
MRSA, PCR: NEGATIVE
Staphylococcus aureus: POSITIVE — AB

## 2019-11-22 LAB — GLUCOSE, CAPILLARY: Glucose-Capillary: 380 mg/dL — ABNORMAL HIGH (ref 70–99)

## 2019-11-22 LAB — HEMOGLOBIN A1C
Hgb A1c MFr Bld: 11.4 % — ABNORMAL HIGH (ref 4.8–5.6)
Mean Plasma Glucose: 280.48 mg/dL

## 2019-11-22 LAB — SARS CORONAVIRUS 2 (TAT 6-24 HRS): SARS Coronavirus 2: NEGATIVE

## 2019-11-22 NOTE — Progress Notes (Signed)
Your procedure is scheduled on Monday June 7.  Report to [facility] Main Entrance "A" at 05:30 A.M., and check in at the Admitting office.  Call this number if you have problems the morning of surgery: [phone]  Call [phone] if you have any questions prior to your surgery date Monday-Friday 8am-4pm   Remember: Do not eat or drink after midnight the night before your surgery    Take these medicines the morning of surgery with A SIP OF WATER: acetaminophen (TYLENOL) - if needed albuterol (PROAIR HFA) - if needed ----- Please bring all inhalers with you the day of surgery.  allopurinol (ZYLOPRIM)  ezetimibe (ZETIA) rosuvastatin (CRESTOR)   Follow your surgeon's instructions on when to stop Aspirin.  If no instructions were given by your surgeon then you will need to call the office to get those instructions.     As of today, STOP taking any meloxicam (MOBIC), Aspirin containing products, Aleve, Naproxen, Ibuprofen, Motrin, Advil, Goody's, BC's, all herbal medications, fish oil, and all vitamins.    WHAT DO I DO ABOUT MY DIABETES MEDICATION?   . THE DAY BEFORE SURGERY  o metFORMIN (GLUCOPHAGE) - take usual doses o TRESIBA FLEXTOUCH - inject 15 units into skin at bedtime   . THE MORNING OF SURGERY o metFORMIN (GLUCOPHAGE) - none-  (do NOT take any oral diabetic medications morning of surgery)  o TRESIBA FLEXTOUCH - none   HOW TO MANAGE YOUR DIABETES BEFORE AND AFTER SURGERY  Why is it important to control my blood sugar before and after surgery? . Improving blood sugar levels before and after surgery helps healing and can limit problems. . A way of improving blood sugar control is eating a healthy diet by: o  Eating less sugar and carbohydrates o  Increasing activity/exercise o  Talking with your doctor about reaching your blood sugar goals . High blood sugars (greater than 180 mg/dL) can raise your risk of infections and slow your recovery, so you will need to  focus on controlling your diabetes during the weeks before surgery. . Make sure that the doctor who takes care of your diabetes knows about your planned surgery including the date and location.  How do I manage my blood sugar before surgery? . Check your blood sugar at least 4 times a day, starting 2 days before surgery, to make sure that the level is not too high or low. . Check your blood sugar the morning of your surgery when you wake up and every 2 hours until you get to the Short Stay unit. o If your blood sugar is less than 70 mg/dL, you will need to treat for low blood sugar: - Do not take insulin. - Treat a low blood sugar (less than 70 mg/dL) with  cup of clear juice (cranberry or apple), 4 glucose tablets, OR glucose gel. - Recheck blood sugar in 15 minutes after treatment (to make sure it is greater than 70 mg/dL). If your blood sugar is not greater than 70 mg/dL on recheck, call [phone] for further instructions. . Report your blood sugar to the short stay nurse when you get to Short Stay.  . If you are admitted to the hospital after surgery: o Your blood sugar will be checked by the staff and you will probably be given insulin after surgery (instead of oral diabetes medicines) to make sure you have good blood sugar levels. o The goal for blood sugar control after surgery is 80-180 mg/dL.    The  Morning of Surgery  Do not wear jewelry  Do not wear lotions, powders, colognes, or deodorant  Men may shave face and neck.  Do not bring valuables to the hospital.  [facility] is not responsible for any belongings or valuables.  If you are a smoker, DO NOT Smoke 24 hours prior to surgery  If you wear a CPAP at night please bring your mask the morning of surgery   Remember that you must have someone to transport you home after your surgery, and remain with you for 24 hours if you are discharged the same day.   Please bring cases for contacts, glasses, hearing aids, dentures or  bridgework because it cannot be worn into surgery.    Leave your suitcase in the car.  After surgery it may be brought to your room.  For patients admitted to the hospital, discharge time will be determined by your treatment team.  Patients discharged the day of surgery will not be allowed to drive home.    Special instructions:   [facility]- Preparing For Surgery  Before surgery, you can play an important role. Because skin is not sterile, your skin needs to be as free of germs as possible. You can reduce the number of germs on your skin by washing with CHG (chlorahexidine gluconate) Soap before surgery.  CHG is an antiseptic cleaner which kills germs and bonds with the skin to continue killing germs even after washing.    Oral Hygiene is also important to reduce your risk of infection.  Remember - BRUSH YOUR TEETH THE MORNING OF SURGERY WITH YOUR REGULAR TOOTHPASTE  Please do not use if you have an allergy to CHG or antibacterial soaps. If your skin becomes reddened/irritated stop using the CHG.  Do not shave (including legs and underarms) for at least 48 hours prior to first CHG shower. It is OK to shave your face.  Please follow these instructions carefully.   1. Shower the NIGHT BEFORE SURGERY and the MORNING OF SURGERY with CHG Soap.   2. If you chose to wash your hair and body, wash as usual with your normal shampoo and body-wash/soap.  3. Rinse your hair and body thoroughly to remove the shampoo and soap.  4. Apply CHG directly to the skin (ONLY FROM THE NECK DOWN) and wash gently with a scrungie or a clean washcloth.   5. Do not use on open wounds or open sores. Avoid contact with your eyes, ears, mouth and genitals (private parts). Wash Face and genitals (private parts)  with your normal soap.   6. Wash thoroughly, paying special attention to the area where your surgery will be performed.  7. Thoroughly rinse your body with warm water from the neck down.  8. DO NOT  shower/wash with your normal soap after using and rinsing off the CHG Soap.  9. Pat yourself dry with a CLEAN TOWEL.  10. Wear CLEAN PAJAMAS to bed the night before surgery  11. Place CLEAN SHEETS on your bed the night of your first shower and DO NOT SLEEP WITH PETS.  12. Wear comfortable clothes the morning of surgery.     Day of Surgery:  Please shower the morning of surgery with the CHG soap Do not apply any deodorants/lotions. Please wear clean clothes to the hospital/surgery center.   Remember to brush your teeth WITH YOUR REGULAR TOOTHPASTE.   Please read over the following fact sheets that you were given.

## 2019-11-22 NOTE — Progress Notes (Signed)
   11/22/19 1211  OBSTRUCTIVE SLEEP APNEA  Have you ever been diagnosed with sleep apnea through a sleep study? No  Do you snore loudly (loud enough to be heard through closed doors)?  1  Do you often feel tired, fatigued, or sleepy during the daytime (such as falling asleep during driving or talking to someone)? 0  Has anyone observed you stop breathing during your sleep? 0  Do you have, or are you being treated for high blood pressure? 1  BMI more than 35 kg/m2? 1  Age > 50 (1-yes) 1  Neck circumference greater than:Male 16 inches or larger, Male 17inches or larger? 0  Male Gender (Yes=1) 1  Obstructive Sleep Apnea Score 5

## 2019-11-22 NOTE — Progress Notes (Addendum)
PCP - Miguel Aschoff, MD Cardiologist - [name], MD [facility])  PPM/ICD - n/a Device Orders -  Rep Notified -   Chest x-ray - 11/22/19 EKG - 11/22/19 Stress Test - pt denies  ECHO - 09/28/19 Cardiac Cath - 10/19/19   Sleep Study - n/a CPAP -   Fasting Blood Sugar - pt does not know, has CBG meter at home but rarely uses it.  Checks Blood Sugar - does not check  Blood Thinner Instructions: n/a Aspirin Instructions: Follow your surgeon's instructions on when to stop Aspirin.  If no instructions were given by your surgeon then you will need to call the office to get those instructions.    Pt instructed to continue ASA as prescribed and NONE on the day of surgery. Pt also instructed to stop metformin 2 days prior to surgery. Pt instructions updated in PAT. Pt verbalized understanding with teachback of these instructions.    ERAS Protcol - n/a PRE-SURGERY Ensure or G2- n/a  COVID TEST- 11/22/19   Anesthesia review: CBG 380 in PAT, [place], [place] aware, Levonne Spiller, RN made aware as well. cardiac hx  Patient denies shortness of breath, fever, cough and chest pain at PAT appointment   All instructions explained to the patient, with a verbal understanding of the material. Patient agrees to go over the instructions while at home for a better understanding. Patient also instructed to self quarantine after being tested for COVID-19. The opportunity to ask questions was provided.

## 2019-11-23 MED ORDER — SODIUM CHLORIDE 0.9 % IV SOLN
1.5000 g | INTRAVENOUS | Status: AC
  Administered 2019-11-26: 1.5 g via INTRAVENOUS
  Filled 2019-11-23: qty 1.5

## 2019-11-23 MED ORDER — MAGNESIUM SULFATE 50 % IJ SOLN
40.0000 meq | INTRAMUSCULAR | Status: DC
  Filled 2019-11-23: qty 9.85

## 2019-11-23 MED ORDER — EPINEPHRINE HCL 5 MG/250ML IV SOLN IN NS
0.0000 ug/min | INTRAVENOUS | Status: DC
  Filled 2019-11-23: qty 250

## 2019-11-23 MED ORDER — INSULIN REGULAR(HUMAN) IN NACL 100-0.9 UT/100ML-% IV SOLN
INTRAVENOUS | Status: AC
  Administered 2019-11-26: 1 [IU]/h via INTRAVENOUS
  Filled 2019-11-23: qty 100

## 2019-11-23 MED ORDER — TRANEXAMIC ACID (OHS) BOLUS VIA INFUSION
15.0000 mg/kg | INTRAVENOUS | Status: AC
  Administered 2019-11-26: 1800 mg via INTRAVENOUS
  Filled 2019-11-23: qty 1800

## 2019-11-23 MED ORDER — PHENYLEPHRINE HCL-NACL 20-0.9 MG/250ML-% IV SOLN
30.0000 ug/min | INTRAVENOUS | Status: AC
  Administered 2019-11-26: 25 ug/min via INTRAVENOUS
  Filled 2019-11-23: qty 250

## 2019-11-23 MED ORDER — NOREPINEPHRINE 4 MG/250ML-% IV SOLN
0.0000 ug/min | INTRAVENOUS | Status: DC
  Filled 2019-11-23: qty 250

## 2019-11-23 MED ORDER — MILRINONE LACTATE IN DEXTROSE 20-5 MG/100ML-% IV SOLN
0.3000 ug/kg/min | INTRAVENOUS | Status: DC
  Filled 2019-11-23: qty 100

## 2019-11-23 MED ORDER — TRANEXAMIC ACID (OHS) PUMP PRIME SOLUTION
2.0000 mg/kg | INTRAVENOUS | Status: DC
  Filled 2019-11-23: qty 2.4

## 2019-11-23 MED ORDER — PLASMA-LYTE 148 IV SOLN
INTRAVENOUS | Status: DC
  Filled 2019-11-23: qty 2.5

## 2019-11-23 MED ORDER — SODIUM CHLORIDE 0.9 % IV SOLN
750.0000 mg | INTRAVENOUS | Status: AC
  Administered 2019-11-26: 750 mg via INTRAVENOUS
  Filled 2019-11-23: qty 750

## 2019-11-23 MED ORDER — DEXMEDETOMIDINE HCL IN NACL 400 MCG/100ML IV SOLN
0.1000 ug/kg/h | INTRAVENOUS | Status: AC
  Administered 2019-11-26: .7 ug/kg/h via INTRAVENOUS
  Filled 2019-11-23: qty 100

## 2019-11-23 MED ORDER — NITROGLYCERIN IN D5W 200-5 MCG/ML-% IV SOLN
2.0000 ug/min | INTRAVENOUS | Status: AC
  Administered 2019-11-26: 5 ug/min via INTRAVENOUS
  Filled 2019-11-23: qty 250

## 2019-11-23 MED ORDER — POTASSIUM CHLORIDE 2 MEQ/ML IV SOLN
80.0000 meq | INTRAVENOUS | Status: DC
  Filled 2019-11-23: qty 40

## 2019-11-23 MED ORDER — TRANEXAMIC ACID 1000 MG/10ML IV SOLN
1.5000 mg/kg/h | INTRAVENOUS | Status: AC
  Administered 2019-11-26 (×2): 1.5 mg/kg/h via INTRAVENOUS
  Filled 2019-11-23: qty 25

## 2019-11-23 MED ORDER — VANCOMYCIN HCL 1500 MG/300ML IV SOLN
1500.0000 mg | INTRAVENOUS | Status: AC
  Administered 2019-11-26: 1500 mg via INTRAVENOUS
  Filled 2019-11-23: qty 300

## 2019-11-23 MED ORDER — SODIUM CHLORIDE 0.9 % IV SOLN
INTRAVENOUS | Status: DC
  Filled 2019-11-23: qty 30

## 2019-11-23 NOTE — Anesthesia Preprocedure Evaluation (Addendum)
Anesthesia Evaluation  Patient identified by MRN, date of birth, ID band Patient awake    Reviewed: Allergy & Precautions, NPO status , Patient's Chart, lab work & pertinent test results  History of Anesthesia Complications Negative for: history of anesthetic complications  Airway Mallampati: II  TM Distance: >3 FB Neck ROM: Full    Dental  (+) Dental Advisory Given   Pulmonary COPD,  COPD inhaler, former smoker,  11/22/2019 SARS coronavirus NEG   breath sounds clear to auscultation       Cardiovascular hypertension, Pt. on medications (-) angina+ CAD (single vessel LAD disease)  + Valvular Problems/Murmurs AS  Rhythm:Regular Rate:Normal + Systolic murmurs 01/1102 ECHO: EF 60-65%, severe AS with mean gradient 53 mmHg, peak gradient 97 mmHg, mild MR   Neuro/Psych negative neurological ROS     GI/Hepatic negative GI ROS, Neg liver ROS,   Endo/Other  diabetes (glu 270), Insulin Dependent, Oral Hypoglycemic AgentsMorbid obesity  Renal/GU negative Renal ROS     Musculoskeletal  (+) Arthritis ,   Abdominal (+) + obese,   Peds  Hematology negative hematology ROS (+)   Anesthesia Other Findings   Reproductive/Obstetrics                          Anesthesia Physical Anesthesia Plan  ASA: IV  Anesthesia Plan: General   Post-op Pain Management:    Induction: Intravenous  PONV Risk Score and Plan: 2  Airway Management Planned: Oral ETT  Additional Equipment: Arterial line, PA Cath, TEE and Ultrasound Guidance Line Placement  Intra-op Plan:   Post-operative Plan: Post-operative intubation/ventilation  Informed Consent: I have reviewed the patients History and Physical, chart, labs and discussed the procedure including the risks, benefits and alternatives for the proposed anesthesia with the patient or authorized representative who has indicated his/her understanding and acceptance.      Dental advisory given  Plan Discussed with: CRNA and Surgeon  Anesthesia Plan Comments: (Uncontrolled IDDM 2, blood glucose 380 and A1c 11.4 at PAT. Dr. Cyndia Bent made aware.)      Anesthesia Quick Evaluation

## 2019-11-23 NOTE — H&P (Signed)
Jeffery West       [name] [address]             [phone]      Cardiothoracic Surgery Admission History and Physical   PCP is Jerrol Banana., MD  Referring Provider is Minna Merritts, MD      Chief Complaint  Patient presents with  . Aortic Stenosis and severe single vessel coronary artery disease.       HPI:  The patient is a 64 year old gentleman with a history of obesity, hypertension, hyperlipidemia, poorly controlled diabetes, remote cigar smoking, and aortic stenosis that has been followed by periodic echocardiograms. He had a 2D echocardiogram at [facility] in June 2017 that showed a mean aortic valve gradient of 25.5 mmHg. His most recent echocardiogram on 09/28/2019 showed an increase in the aortic valve mean gradient to 53.5 mmHg. Left ventricular ejection fraction was 60 to 65% with grade 1 diastolic dysfunction. Cardiac catheterization on 10/19/2019 showed a 80% stenosis in the mid LAD. There was 70% first diagonal stenosis.  The patient is married and lives with his wife. They both report a history of exertional fatigue and shortness of breath which has progressed over the past 6 months or so. He denies any chest discomfort. He has had no dizziness or syncope. He denies orthopnea and peripheral edema.      Past Medical History:  Diagnosis Date  . Diabetes mellitus without complication ([place])   . Erectile dysfunction   . Gout   . Heart murmur   . Hyperlipidemia   . Hypertension   . Obesity   . Osteoarthritis   . Vitamin D deficiency         Past Surgical History:  Procedure Laterality Date  . COLONOSCOPY WITH PROPOFOL N/A 08/24/2019   Procedure: COLONOSCOPY WITH PROPOFOL; Surgeon: Lucilla Lame, MD; Location: [facility] END[facility]COPY; Service: Endoscopy; Laterality: N/A;  . LEFT HEART CATH AND CORONARY ANGIOGRAPHY Left 10/19/2019   Procedure: LEFT HEART CATH AND CORONARY ANGIOGRAPHY; Surgeon: Minna Merritts, MD; Location: [place] CV LAB;  Service: Cardiovascular; Laterality: Left;  . NO PAST SURGERIES    . RIGHT HEART CATH N/A 10/19/2019   Procedure: RIGHT HEART CATH; Surgeon: Minna Merritts, MD; Location: [place] [facility]; Service: Cardiovascular; Laterality: N/A;  . SHOULDER ARTHROSCOPY WITH OPEN ROTATOR CUFF REPAIR Right 07/26/2017   Procedure: SHOULDER ARTHROSCOPY WITH OPEN ROTATOR CUFF REPAIR,SUBACROMINAL DECOMPRESSION,DISTAL CLAVICLE EXCISION; Surgeon: Thornton Park, MD; Location: [facility] ORS; Service: Orthopedics; Laterality: Right;        Family History  Problem Relation Age of Onset  . Asthma Mother   . Cancer Father    possibly in liver and lung   Social History  Social History        Tobacco Use  . Smoking status: Former Smoker    Types: Cigars  . Smokeless tobacco: Never Used  . Tobacco comment: every once in a while, very rare.  Substance Use Topics  . Alcohol use: Yes    Alcohol/week: 0.0 standard drinks    Comment: social  . Drug use: No         Current Outpatient Medications  Medication Sig Dispense Refill  . acetaminophen (TYLENOL) 500 MG tablet Take 1,000 mg by mouth every 6 (six) hours as needed for moderate pain or headache.    . albuterol (PROAIR HFA) 108 (90 Base) MCG/ACT inhaler Inhale 1 puff into the lungs every 6 (six) hours as needed for wheezing or  shortness of breath. 18 g 3  . allopurinol (ZYLOPRIM) 300 MG tablet Take 1 tablet (300 mg total) by mouth daily. 90 tablet 3  . BD PEN NEEDLE NANO U/F 32G X 4 MM MISC 1 EACH BY DOES NOT APPLY ROUTE DAILY. 100 each 11  . Carboxymethylcellulose Sodium (MOISTURIZING LUBRICANT EYE OP) Place 1 drop into both eyes daily as needed (for dry eyes).    . Cholecalciferol (VITAMIN D) 2000 units CAPS Take 2,000 Units by mouth every evening.     . ezetimibe (ZETIA) 10 MG tablet TAKE 1 TABLET BY MOUTH EVERY DAY (Patient taking differently: Take 10 mg by mouth daily. ) 90 tablet 3  . glucose blood (ONETOUCH VERIO) test strip Use as instructed; BID 100  each 12  . losartan (COZAAR) 50 MG tablet TAKE 1 TABLET BY MOUTH EVERY DAY 90 tablet 2  . magnesium oxide (MAG-OX) 400 MG tablet TAKE 1 TABLET BY MOUTH TWICE A DAY (Patient taking differently: Take 400 mg by mouth 2 (two) times daily. ) 180 tablet 3  . meloxicam (MOBIC) 15 MG tablet Take 15 mg by mouth daily.     . metFORMIN (GLUCOPHAGE) 500 MG tablet TAKE 1 TABLET BY MOUTH TWICE A DAY WITH MEALS (Patient taking differently: Take 500 mg by mouth 2 (two) times daily with a meal. ) 180 tablet 3  . ONE TOUCH LANCETS MISC 1 Device by Does not apply route 2 (two) times daily. 60 each 11  . TRESIBA FLEXTOUCH 100 UNIT/ML SOPN FlexTouch Pen INJECT 30 UNITS TOTAL INTO THE SKIN DAILY AT 10 PM. (Patient taking differently: Inject 30 Units into the skin at bedtime. ) 27 pen 1  . sildenafil (REVATIO) 20 MG tablet Take 1 tablet (20 mg total) by mouth 3 (three) times daily as needed. (Patient not taking: Reported on 10/24/2019) 90 tablet 1  . tamsulosin (FLOMAX) 0.4 MG CAPS capsule TAKE 1 CAPSULE BY MOUTH EVERY DAY (Patient not taking: Reported on 10/19/2019) 90 capsule 3   No current facility-administered medications for this visit.        Allergies  Allergen Reactions  . Penicillins Hives, Swelling and Other (See Comments)    Has patient had a PCN reaction causing immediate rash, facial/tongue/throat swelling, SOB or lightheadedness with hypotension: Unknown  Has patient had a PCN reaction causing severe rash involving mucus membranes or skin necrosis: No  Has patient had a PCN reaction that required hospitalization: no  Has patient had a PCN reaction occurring within the last 10 years: No  If all of the above answers are "NO", then may proceed with Cephalosporin use.    Review of Systems  Constitutional: Positive for fatigue.  HENT: Negative.  Last saw his dentist a few months ago for cleaning.  Eyes: Negative.  Respiratory: Positive for shortness of breath.  Cardiovascular: Negative for chest pain,  palpitations and leg swelling.  Gastrointestinal: Negative.  Endocrine: Negative.  Genitourinary: Positive for frequency.  Musculoskeletal: Positive for arthralgias.  Leg cramps  Skin: Negative.  Allergic/Immunologic: Negative.  Neurological: Negative for dizziness and syncope.  Hematological: Negative.  Psychiatric/Behavioral: Negative.   BP (!) 142/87  Pulse 86  Temp 98.4 F (36.9 C) (Temporal)  Resp 20  Ht 5\' 11"  (1.803 m)  Wt 268 lb 8 oz (121.8 kg)  SpO2 94% Comment: RA  BMI 37.45 kg/m  Physical Exam  Constitutional:  Appearance: Normal appearance. He is obese.  HENT:  Head: Normocephalic and atraumatic.  Eyes:  Extraocular Movements: Extraocular movements intact.  Pupils: Pupils are equal, round, and reactive to light.  Neck:  Vascular: No carotid bruit.  Cardiovascular:  Rate and Rhythm: Normal rate and regular rhythm.  Heart sounds: Murmur present.  Comments: 3/6 systolic murmur along the right sternal border. Pulmonary:  Effort: Pulmonary effort is normal.  Breath sounds: Normal breath sounds.  Abdominal:  Comments: Obese and nontender  Musculoskeletal:  General: No swelling.  Cervical back: Normal range of motion.  Lymphadenopathy:  Cervical: No cervical adenopathy.  Skin:  General: Skin is warm and dry.  Neurological:  General: No focal deficit present.  Mental Status: He is alert and oriented to person, place, and time.  Psychiatric:  Mood and Affect: Mood normal.  Behavior: Behavior normal.  Thought Content: Thought content normal.  Judgment: Judgment normal.   Diagnostic Tests:  ECHOCARDIOGRAM REPORT     Patient Name: Jeffery West Date of Exam: 09/28/2019  Medical Rec #: [id number] Height: 71.5 in  Accession #: [id number] Weight: 270.0 lb  Date of Birth: 11-08-1955 BSA: 2.408 m  Patient Age: 56 years BP: 130/72 mmHg  Patient Gender: M HR: 88 bpm.  Exam Location: [place]   Procedure: 2D Echo, Cardiac Doppler, Color Doppler and  Intracardiac  Opacification Agent   Indications: I35.0 Nonrheumatic aortic (valve) stenosis   History: Patient has prior history of Echocardiogram examinations,  most  recent 11/26/2015. Aortic Valve Disease,  Signs/Symptoms:Shortness  of Breath and Murmur; Risk Factors:Hypertension, Diabetes,  Dyslipidemia and Former Smoker. Patient c/o of mild SOB.   Sonographer: Pilar Jarvis RDMS, RVT, RDCS  Referring Phys: [place]    1. Left ventricular ejection fraction, by estimation, is 60 to 65%. The  left ventricle has normal function. The left ventricle has no regional  wall motion abnormalities. Left ventricular diastolic parameters are  consistent with Grade I diastolic  dysfunction (impaired relaxation).  2. Right ventricular systolic function is normal. The right ventricular  size is normal.  3. Left atrial size was mildly dilated.  4. The aortic valve is severely calcified. Severe aortic valve stenosis.  Aortic valve area, by VTI measures 1.10 cm. Aortic valve mean gradient  measures 53.5 mmHg. Aortic valve Vmax measures 4.93 m/s.   FINDINGS  Left Ventricle: Left ventricular ejection fraction, by estimation, is 60  to 65%. The left ventricle has normal function. The left ventricle has no  regional wall motion abnormalities. Definity contrast agent was given IV  to delineate the left ventricular  endocardial borders. The left ventricular internal cavity size was normal  in size. There is no left ventricular hypertrophy. Left ventricular  diastolic parameters are consistent with Grade I diastolic dysfunction  (impaired relaxation).   Right Ventricle: The right ventricular size is normal. No increase in  right ventricular wall thickness. Right ventricular systolic function is  normal.   Left Atrium: Left atrial size was mildly dilated.   Right Atrium: Right atrial size was normal in size.   Pericardium: There is no evidence of pericardial effusion.    Mitral Valve: The mitral valve is normal in structure. Normal mobility of  the mitral valve leaflets. Mild mitral valve regurgitation. No evidence of  mitral valve stenosis.   Tricuspid Valve: The tricuspid valve is normal in structure. Tricuspid  valve regurgitation is not demonstrated. No evidence of tricuspid  stenosis.   Aortic Valve: The aortic valve is normal in structure. Aortic valve  regurgitation is not visualized. Severe aortic stenosis is present. Aortic  valve mean gradient measures 53.5  mmHg. Aortic valve peak gradient  measures 97.2 mmHg. Aortic valve area, by  VTI measures 1.10 cm.   Pulmonic Valve: The pulmonic valve was normal in structure. Pulmonic valve  regurgitation is not visualized. No evidence of pulmonic stenosis.   Aorta: The aortic root is normal in size and structure.   Venous: The inferior vena cava is normal in size with greater than 50%  respiratory variability, suggesting right atrial pressure of 3 mmHg.   IAS/Shunts: No atrial level shunt detected by color flow Doppler.    LEFT VENTRICLE  PLAX 2D  LVIDd: 4.40 cm Diastology  LVIDs: 2.50 cm LV e' lateral: 6.09 cm/s  LV PW: 1.30 cm LV E/e' lateral: 14.4  LV IVS: 1.20 cm LV e' medial: 6.09 cm/s  LVOT diam: 2.10 cm LV E/e' medial: 14.4  LV SV: 104  LV SV Index: 43  LVOT Area: 3.46 cm    RIGHT VENTRICLE IVC  RV Basal diam: 4.60 cm IVC diam: 2.30 cm  RV S prime: 16.40 cm/s  TAPSE (M-mode): 3.3 cm   LEFT ATRIUM Index  LA diam: 4.10 cm 1.70 cm/m  LA Vol (A2C): 53.7 ml 22.30 ml/m  LA Vol (A4C): 47.9 ml 19.89 ml/m  LA Biplane Vol: 55.9 ml 23.22 ml/m  AORTIC VALVE PULMONIC VALVE  AV Area (Vmax): 0.91 cm PV Vmax: 1.26 m/s  AV Area (Vmean): 1.00 cm PV Peak grad: 6.4 mmHg  AV Area (VTI): 1.10 cm  AV Vmax: 493.00 cm/s  AV Vmean: 345.500 cm/s  AV VTI: 0.944 m  AV Peak Grad: 97.2 mmHg  AV Mean Grad: 53.5 mmHg  LVOT Vmax: 130.00 cm/s  LVOT Vmean: 99.800 cm/s  LVOT VTI: 0.300 m    LVOT/AV VTI ratio: 0.32   AORTA  Ao Asc diam: 3.30 cm  Ao Arch diam: 3.1 cm   MITRAL VALVE  MV Area (PHT): 1.89 cm SHUNTS  MV Decel Time: 402 msec Systemic VTI: 0.30 m  MV E velocity: 87.40 cm/s Systemic Diam: 2.10 cm  MV A velocity: 104.00 cm/s  MV E/A ratio: 0.84   [name] MD  Electronically signed by [name] MD  Signature Date/Time: 09/28/2019/7:49:43 PM    Physicians  Panel Physicians Referring Physician Case Authorizing Physician  Minna Merritts, MD (Primary)       Procedures  LEFT HEART CATH AND CORONARY ANGIOGRAPHY  RIGHT HEART CATH     Conclusion  Mid LAD-1 lesion is 40% stenosed.  Mid LAD-2 lesion is 80% stenosed.  1st Diag lesion is 70% stenosed.  The left ventricular systolic function is normal.  LV end diastolic pressure is normal.  The left ventricular ejection fraction is 55-65% by visual estimate.  There is no mitral valve regurgitation.  There is severe aortic valve stenosis.         Impression:   This 64 year old diabetic gentleman has stage D, critical aortic stenosis and severe single-vessel coronary disease with New York Heart Association class II symptoms of exertional fatigue and shortness of breath. I have personally reviewed his 2D echocardiogram and cardiac catheterization images. His echocardiogram shows a heavily calcified aortic valve with restricted mobility. The mean gradient is 53.5 mmHg consistent with critical aortic stenosis. It is not possible to tell how many leaflets there are. The aorta is of normal size. Cardiac catheterization shows severe single-vessel coronary disease with an 80% mid LAD stenosis and a 70% first diagonal stenosis. I think coronary bypass graft surgery and aortic valve replacement is the best treatment for this  gentleman given his relatively young age and presence of poorly controlled diabetes. TAVR is not recommended for patients less than 47 years of age due to lack of long-term durability data.  I reviewed the echocardiogram and cardiac catheterization images with the patient and his wife and answered all their questions. I discussed the alternatives of mechanical and bioprosthetic valves. We reviewed the pros and cons of both and he and his wife feel that a bioprosthetic valve would be the best option for him so that he does not need to be on Coumadin. He has significant arthritis and takes nonsteroidal anti-inflammatory agents on a routine basis to remain functional. I think the current generation of pericardial bioprosthesis will have good long-term durability for him.  I discussed the operative procedure with the patient and his wife including alternatives, benefits and risks; including but not limited to bleeding, blood transfusion, infection, stroke, myocardial infarction, graft failure, heart block requiring a permanent pacemaker, organ dysfunction, and death. Jeffery West understands and agrees to proceed.   Plan:   Aortic valve replacement using a bioprosthetic valve and coronary bypass graft surgery to the diagonal and LAD.    Gaye Pollack, MD  [facility]  [phone]

## 2019-11-26 ENCOUNTER — Inpatient Hospital Stay (HOSPITAL_COMMUNITY): Payer: No Typology Code available for payment source | Admitting: Physician Assistant

## 2019-11-26 ENCOUNTER — Encounter (HOSPITAL_COMMUNITY): Payer: Self-pay | Admitting: Surgery

## 2019-11-26 ENCOUNTER — Inpatient Hospital Stay (HOSPITAL_COMMUNITY): Payer: No Typology Code available for payment source

## 2019-11-26 ENCOUNTER — Other Ambulatory Visit: Payer: Self-pay

## 2019-11-26 ENCOUNTER — Inpatient Hospital Stay (HOSPITAL_COMMUNITY)
Admission: RE | Disposition: A | Discharge status: Home / Self Care | Payer: Self-pay | Source: Ambulatory Visit | Attending: Surgery

## 2019-11-26 ENCOUNTER — Inpatient Hospital Stay (HOSPITAL_COMMUNITY): Payer: No Typology Code available for payment source | Admitting: Critical Care Medicine

## 2019-11-26 ENCOUNTER — Inpatient Hospital Stay (HOSPITAL_COMMUNITY)
Admission: RE | Admit: 2019-11-26 | Discharge: 2019-12-02 | DRG: 220 | Disposition: A | Discharge status: Home / Self Care | Payer: No Typology Code available for payment source | Source: Ambulatory Visit | Attending: Surgery | Admitting: Surgery

## 2019-11-26 DIAGNOSIS — J309 Allergic rhinitis, unspecified: Secondary | ICD-10-CM | POA: Diagnosis present

## 2019-11-26 DIAGNOSIS — M109 Gout, unspecified: Secondary | ICD-10-CM | POA: Diagnosis present

## 2019-11-26 DIAGNOSIS — E8770 Fluid overload, unspecified: Secondary | ICD-10-CM | POA: Diagnosis not present

## 2019-11-26 DIAGNOSIS — E785 Hyperlipidemia, unspecified: Secondary | ICD-10-CM | POA: Diagnosis present

## 2019-11-26 DIAGNOSIS — E1165 Type 2 diabetes mellitus with hyperglycemia: Secondary | ICD-10-CM | POA: Diagnosis present

## 2019-11-26 DIAGNOSIS — D62 Acute posthemorrhagic anemia: Secondary | ICD-10-CM | POA: Diagnosis not present

## 2019-11-26 DIAGNOSIS — E559 Vitamin D deficiency, unspecified: Secondary | ICD-10-CM | POA: Diagnosis present

## 2019-11-26 DIAGNOSIS — Z880 Allergy status to penicillin: Secondary | ICD-10-CM

## 2019-11-26 DIAGNOSIS — N529 Male erectile dysfunction, unspecified: Secondary | ICD-10-CM | POA: Diagnosis present

## 2019-11-26 DIAGNOSIS — I2510 Atherosclerotic heart disease of native coronary artery without angina pectoris: Secondary | ICD-10-CM | POA: Diagnosis present

## 2019-11-26 DIAGNOSIS — Z79899 Other long term (current) drug therapy: Secondary | ICD-10-CM

## 2019-11-26 DIAGNOSIS — Z7984 Long term (current) use of oral hypoglycemic drugs: Secondary | ICD-10-CM | POA: Diagnosis not present

## 2019-11-26 DIAGNOSIS — Q211 Atrial septal defect: Secondary | ICD-10-CM

## 2019-11-26 DIAGNOSIS — I350 Nonrheumatic aortic (valve) stenosis: Principal | ICD-10-CM | POA: Diagnosis present

## 2019-11-26 DIAGNOSIS — I10 Essential (primary) hypertension: Secondary | ICD-10-CM | POA: Diagnosis present

## 2019-11-26 DIAGNOSIS — Z87891 Personal history of nicotine dependence: Secondary | ICD-10-CM

## 2019-11-26 DIAGNOSIS — E669 Obesity, unspecified: Secondary | ICD-10-CM | POA: Diagnosis present

## 2019-11-26 DIAGNOSIS — Z952 Presence of prosthetic heart valve: Secondary | ICD-10-CM

## 2019-11-26 DIAGNOSIS — Z6837 Body mass index (BMI) 37.0-37.9, adult: Secondary | ICD-10-CM | POA: Diagnosis not present

## 2019-11-26 DIAGNOSIS — Z7289 Other problems related to lifestyle: Secondary | ICD-10-CM

## 2019-11-26 DIAGNOSIS — Z951 Presence of aortocoronary bypass graft: Secondary | ICD-10-CM

## 2019-11-26 DIAGNOSIS — I493 Ventricular premature depolarization: Secondary | ICD-10-CM | POA: Diagnosis not present

## 2019-11-26 DIAGNOSIS — I351 Nonrheumatic aortic (valve) insufficiency: Secondary | ICD-10-CM

## 2019-11-26 HISTORY — PX: CORONARY ARTERY BYPASS GRAFT: SHX141

## 2019-11-26 HISTORY — DX: Atherosclerotic heart disease of native coronary artery without angina pectoris: I25.10

## 2019-11-26 HISTORY — PX: TEE WITHOUT CARDIOVERSION: SHX5443

## 2019-11-26 HISTORY — PX: AORTIC VALVE REPLACEMENT: SHX41

## 2019-11-26 LAB — POCT I-STAT 7, (LYTES, BLD GAS, ICA,H+H)
Acid-Base Excess: 1 mmol/L (ref 0.0–2.0)
Acid-Base Excess: 1 mmol/L (ref 0.0–2.0)
Acid-Base Excess: 0 mmol/L (ref 0.0–2.0)
Acid-base deficit: 2 mmol/L (ref 0.0–2.0)
Acid-base deficit: 2 mmol/L (ref 0.0–2.0)
Bicarbonate: 24.5 mmol/L (ref 20.0–28.0)
Bicarbonate: 25.7 mmol/L (ref 20.0–28.0)
Bicarbonate: 26.5 mmol/L (ref 20.0–28.0)
Bicarbonate: 27.3 mmol/L (ref 20.0–28.0)
Bicarbonate: 25.6 mmol/L (ref 20.0–28.0)
Calcium, Ion: 1.25 mmol/L (ref 1.15–1.40)
Calcium, Ion: 1.25 mmol/L (ref 1.15–1.40)
Calcium, Ion: 1.1 mmol/L — ABNORMAL LOW (ref 1.15–1.40)
Calcium, Ion: 1.13 mmol/L — ABNORMAL LOW (ref 1.15–1.40)
Calcium, Ion: 1.12 mmol/L — ABNORMAL LOW (ref 1.15–1.40)
HCT: 40 % (ref 39.0–52.0)
HCT: 39 % (ref 39.0–52.0)
HCT: 32 % — ABNORMAL LOW (ref 39.0–52.0)
HCT: 34 % — ABNORMAL LOW (ref 39.0–52.0)
HCT: 33 % — ABNORMAL LOW (ref 39.0–52.0)
Hemoglobin: 13.6 g/dL (ref 13.0–17.0)
Hemoglobin: 13.3 g/dL (ref 13.0–17.0)
Hemoglobin: 10.9 g/dL — ABNORMAL LOW (ref 13.0–17.0)
Hemoglobin: 11.6 g/dL — ABNORMAL LOW (ref 13.0–17.0)
Hemoglobin: 11.2 g/dL — ABNORMAL LOW (ref 13.0–17.0)
O2 Saturation: 100 %
O2 Saturation: 99 %
O2 Saturation: 100 %
O2 Saturation: 100 %
O2 Saturation: 100 %
Potassium: 5.2 mmol/L — ABNORMAL HIGH (ref 3.5–5.1)
Potassium: 4.6 mmol/L (ref 3.5–5.1)
Potassium: 4 mmol/L (ref 3.5–5.1)
Potassium: 4.2 mmol/L (ref 3.5–5.1)
Potassium: 4 mmol/L (ref 3.5–5.1)
Sodium: 136 mmol/L (ref 135–145)
Sodium: 138 mmol/L (ref 135–145)
Sodium: 137 mmol/L (ref 135–145)
Sodium: 138 mmol/L (ref 135–145)
Sodium: 138 mmol/L (ref 135–145)
TCO2: 26 mmol/L (ref 22–32)
TCO2: 27 mmol/L (ref 22–32)
TCO2: 28 mmol/L (ref 22–32)
TCO2: 29 mmol/L (ref 22–32)
TCO2: 27 mmol/L (ref 22–32)
pCO2 arterial: 49.7 mmHg — ABNORMAL HIGH (ref 32.0–48.0)
pCO2 arterial: 56.1 mmHg — ABNORMAL HIGH (ref 32.0–48.0)
pCO2 arterial: 42.8 mmHg (ref 32.0–48.0)
pCO2 arterial: 50.3 mmHg — ABNORMAL HIGH (ref 32.0–48.0)
pCO2 arterial: 44.5 mmHg (ref 32.0–48.0)
pH, Arterial: 7.3 — ABNORMAL LOW (ref 7.350–7.450)
pH, Arterial: 7.27 — ABNORMAL LOW (ref 7.350–7.450)
pH, Arterial: 7.342 — ABNORMAL LOW (ref 7.350–7.450)
pH, Arterial: 7.399 (ref 7.350–7.450)
pH, Arterial: 7.368 (ref 7.350–7.450)
pO2, Arterial: 248 mmHg — ABNORMAL HIGH (ref 83.0–108.0)
pO2, Arterial: 171 mmHg — ABNORMAL HIGH (ref 83.0–108.0)
pO2, Arterial: 312 mmHg — ABNORMAL HIGH (ref 83.0–108.0)
pO2, Arterial: 343 mmHg — ABNORMAL HIGH (ref 83.0–108.0)
pO2, Arterial: 248 mmHg — ABNORMAL HIGH (ref 83.0–108.0)

## 2019-11-26 LAB — POCT I-STAT, CHEM 8
BUN: 22 mg/dL (ref 8–23)
BUN: 20 mg/dL (ref 8–23)
BUN: 19 mg/dL (ref 8–23)
BUN: 18 mg/dL (ref 8–23)
BUN: 18 mg/dL (ref 8–23)
BUN: 18 mg/dL (ref 8–23)
Calcium, Ion: 1.3 mmol/L (ref 1.15–1.40)
Calcium, Ion: 1.35 mmol/L (ref 1.15–1.40)
Calcium, Ion: 1.09 mmol/L — ABNORMAL LOW (ref 1.15–1.40)
Calcium, Ion: 1.15 mmol/L (ref 1.15–1.40)
Calcium, Ion: 1.11 mmol/L — ABNORMAL LOW (ref 1.15–1.40)
Calcium, Ion: 1.11 mmol/L — ABNORMAL LOW (ref 1.15–1.40)
Chloride: 101 mmol/L (ref 98–111)
Chloride: 103 mmol/L (ref 98–111)
Chloride: 101 mmol/L (ref 98–111)
Chloride: 102 mmol/L (ref 98–111)
Chloride: 104 mmol/L (ref 98–111)
Chloride: 102 mmol/L (ref 98–111)
Creatinine, Ser: 0.6 mg/dL — ABNORMAL LOW (ref 0.61–1.24)
Creatinine, Ser: 0.6 mg/dL — ABNORMAL LOW (ref 0.61–1.24)
Creatinine, Ser: 0.5 mg/dL — ABNORMAL LOW (ref 0.61–1.24)
Creatinine, Ser: 0.7 mg/dL (ref 0.61–1.24)
Creatinine, Ser: 0.5 mg/dL — ABNORMAL LOW (ref 0.61–1.24)
Creatinine, Ser: 0.6 mg/dL — ABNORMAL LOW (ref 0.61–1.24)
Glucose, Bld: 288 mg/dL — ABNORMAL HIGH (ref 70–99)
Glucose, Bld: 211 mg/dL — ABNORMAL HIGH (ref 70–99)
Glucose, Bld: 161 mg/dL — ABNORMAL HIGH (ref 70–99)
Glucose, Bld: 173 mg/dL — ABNORMAL HIGH (ref 70–99)
Glucose, Bld: 178 mg/dL — ABNORMAL HIGH (ref 70–99)
Glucose, Bld: 179 mg/dL — ABNORMAL HIGH (ref 70–99)
HCT: 40 % (ref 39.0–52.0)
HCT: 38 % — ABNORMAL LOW (ref 39.0–52.0)
HCT: 33 % — ABNORMAL LOW (ref 39.0–52.0)
HCT: 33 % — ABNORMAL LOW (ref 39.0–52.0)
HCT: 34 % — ABNORMAL LOW (ref 39.0–52.0)
HCT: 30 % — ABNORMAL LOW (ref 39.0–52.0)
Hemoglobin: 13.6 g/dL (ref 13.0–17.0)
Hemoglobin: 12.9 g/dL — ABNORMAL LOW (ref 13.0–17.0)
Hemoglobin: 11.2 g/dL — ABNORMAL LOW (ref 13.0–17.0)
Hemoglobin: 11.2 g/dL — ABNORMAL LOW (ref 13.0–17.0)
Hemoglobin: 11.6 g/dL — ABNORMAL LOW (ref 13.0–17.0)
Hemoglobin: 10.2 g/dL — ABNORMAL LOW (ref 13.0–17.0)
Potassium: 5 mmol/L (ref 3.5–5.1)
Potassium: 4.6 mmol/L (ref 3.5–5.1)
Potassium: 4 mmol/L (ref 3.5–5.1)
Potassium: 4 mmol/L (ref 3.5–5.1)
Potassium: 3.9 mmol/L (ref 3.5–5.1)
Potassium: 4.1 mmol/L (ref 3.5–5.1)
Sodium: 135 mmol/L (ref 135–145)
Sodium: 137 mmol/L (ref 135–145)
Sodium: 138 mmol/L (ref 135–145)
Sodium: 135 mmol/L (ref 135–145)
Sodium: 138 mmol/L (ref 135–145)
Sodium: 140 mmol/L (ref 135–145)
TCO2: 27 mmol/L (ref 22–32)
TCO2: 26 mmol/L (ref 22–32)
TCO2: 26 mmol/L (ref 22–32)
TCO2: 30 mmol/L (ref 22–32)
TCO2: 26 mmol/L (ref 22–32)
TCO2: 25 mmol/L (ref 22–32)

## 2019-11-26 LAB — BASIC METABOLIC PANEL
Anion gap: 6 (ref 5–15)
BUN: 13 mg/dL (ref 8–23)
CO2: 24 mmol/L (ref 22–32)
Calcium: 7.9 mg/dL — ABNORMAL LOW (ref 8.9–10.3)
Chloride: 107 mmol/L (ref 98–111)
Creatinine, Ser: 0.67 mg/dL (ref 0.61–1.24)
GFR calc Af Amer: >60 mL/min (ref >60)
GFR calc non Af Amer: >60 mL/min (ref >60)
Glucose, Bld: 137 mg/dL — ABNORMAL HIGH (ref 70–99)
Potassium: 4.4 mmol/L (ref 3.5–5.1)
Sodium: 137 mmol/L (ref 135–145)

## 2019-11-26 LAB — GLUCOSE, CAPILLARY
Glucose-Capillary: 270 mg/dL — ABNORMAL HIGH (ref 70–99)
Glucose-Capillary: 139 mg/dL — ABNORMAL HIGH (ref 70–99)
Glucose-Capillary: 147 mg/dL — ABNORMAL HIGH (ref 70–99)
Glucose-Capillary: 133 mg/dL — ABNORMAL HIGH (ref 70–99)
Glucose-Capillary: 135 mg/dL — ABNORMAL HIGH (ref 70–99)
Glucose-Capillary: 130 mg/dL — ABNORMAL HIGH (ref 70–99)
Glucose-Capillary: 130 mg/dL — ABNORMAL HIGH (ref 70–99)
Glucose-Capillary: 146 mg/dL — ABNORMAL HIGH (ref 70–99)

## 2019-11-26 LAB — CBC
HCT: 39.3 % (ref 39.0–52.0)
HCT: 39.4 % (ref 39.0–52.0)
Hemoglobin: 13.4 g/dL (ref 13.0–17.0)
Hemoglobin: 13.6 g/dL (ref 13.0–17.0)
MCH: 31.1 pg (ref 26.0–34.0)
MCH: 31 pg (ref 26.0–34.0)
MCHC: 34.1 g/dL (ref 30.0–36.0)
MCHC: 34.5 g/dL (ref 30.0–36.0)
MCV: 91.2 fL (ref 80.0–100.0)
MCV: 89.7 fL (ref 80.0–100.0)
Platelets: 115 10*3/uL — ABNORMAL LOW (ref 150–400)
Platelets: 119 10*3/uL — ABNORMAL LOW (ref 150–400)
RBC: 4.31 MIL/uL (ref 4.22–5.81)
RBC: 4.39 MIL/uL (ref 4.22–5.81)
RDW: 12.5 % (ref 11.5–15.5)
RDW: 12.6 % (ref 11.5–15.5)
WBC: 14.2 10*3/uL — ABNORMAL HIGH (ref 4.0–10.5)
WBC: 13.4 10*3/uL — ABNORMAL HIGH (ref 4.0–10.5)
nRBC: 0 % (ref 0.0–0.2)
nRBC: 0 % (ref 0.0–0.2)

## 2019-11-26 LAB — HEMOGLOBIN AND HEMATOCRIT, BLOOD
HCT: 34.5 % — ABNORMAL LOW (ref 39.0–52.0)
Hemoglobin: 11.8 g/dL — ABNORMAL LOW (ref 13.0–17.0)

## 2019-11-26 LAB — APTT: aPTT: 26 seconds (ref 24–36)

## 2019-11-26 LAB — ECHO INTRAOPERATIVE TEE
Height: 71 in
Weight: 4192 oz

## 2019-11-26 LAB — MAGNESIUM: Magnesium: 2.6 mg/dL — ABNORMAL HIGH (ref 1.7–2.4)

## 2019-11-26 LAB — PROTIME-INR
INR: 1.2 (ref 0.8–1.2)
Prothrombin Time: 14.8 seconds (ref 11.4–15.2)

## 2019-11-26 LAB — PLATELET COUNT: Platelets: 133 10*3/uL — ABNORMAL LOW (ref 150–400)

## 2019-11-26 SURGERY — CORONARY ARTERY BYPASS GRAFTING (CABG)
Anesthesia: General | Site: Chest

## 2019-11-26 MED ORDER — METOPROLOL TARTRATE 5 MG/5ML IV SOLN: 2.5000 mg | INTRAVENOUS | Status: DC | PRN

## 2019-11-26 MED ORDER — METOPROLOL TARTRATE 12.5 MG HALF TABLET
12.5000 mg | ORAL_TABLET | Freq: Once | ORAL | Status: AC
  Administered 2019-11-26: 12.5 mg via ORAL
  Filled 2019-11-26: qty 1

## 2019-11-26 MED ORDER — PHENYLEPHRINE HCL-NACL 20-0.9 MG/250ML-% IV SOLN
0.0000 ug/min | INTRAVENOUS | Status: DC
  Administered 2019-11-26: 30 ug/min via INTRAVENOUS
  Filled 2019-11-26: qty 500

## 2019-11-26 MED ORDER — METOPROLOL TARTRATE 12.5 MG HALF TABLET
12.5000 mg | ORAL_TABLET | Freq: Two times a day (BID) | ORAL | Status: DC
  Administered 2019-11-27 – 2019-11-28 (×3): 12.5 mg via ORAL
  Filled 2019-11-26 (×3): qty 1

## 2019-11-26 MED ORDER — FENTANYL CITRATE (PF) 250 MCG/5ML IJ SOLN
INTRAMUSCULAR | Status: AC
  Filled 2019-11-26: qty 5

## 2019-11-26 MED ORDER — LACTATED RINGERS IV SOLN: INTRAVENOUS | Status: DC | PRN

## 2019-11-26 MED ORDER — FAMOTIDINE IN NACL 20-0.9 MG/50ML-% IV SOLN
20.0000 mg | Freq: Two times a day (BID) | INTRAVENOUS | Status: AC
  Administered 2019-11-26 (×2): 20 mg via INTRAVENOUS
  Filled 2019-11-26: qty 50

## 2019-11-26 MED ORDER — CHLORHEXIDINE GLUCONATE 4 % EX LIQD: 30.0000 mL | CUTANEOUS | Status: DC

## 2019-11-26 MED ORDER — ORAL CARE MOUTH RINSE
15.0000 mL | OROMUCOSAL | Status: DC
  Administered 2019-11-26 – 2019-11-27 (×4): 15 mL via OROMUCOSAL

## 2019-11-26 MED ORDER — TRANEXAMIC ACID 1000 MG/10ML IV SOLN
0.5000 mg/kg/h | INTRAVENOUS | Status: DC
  Filled 2019-11-26: qty 25

## 2019-11-26 MED ORDER — THROMBIN (RECOMBINANT) 20000 UNITS EX SOLR
CUTANEOUS | Status: AC
  Filled 2019-11-26: qty 20000

## 2019-11-26 MED ORDER — SODIUM CHLORIDE 0.9% FLUSH
3.0000 mL | Freq: Two times a day (BID) | INTRAVENOUS | Status: DC
  Administered 2019-11-27 – 2019-11-28 (×2): 3 mL via INTRAVENOUS

## 2019-11-26 MED ORDER — PHENYLEPHRINE HCL-NACL 10-0.9 MG/250ML-% IV SOLN
INTRAVENOUS | Status: DC | PRN
Start: 2019-11-26 — End: 2019-11-26

## 2019-11-26 MED ORDER — NITROGLYCERIN IN D5W 200-5 MCG/ML-% IV SOLN: 0.0000 ug/min | INTRAVENOUS | Status: DC

## 2019-11-26 MED ORDER — EZETIMIBE 10 MG PO TABS
10.0000 mg | ORAL_TABLET | Freq: Every day | ORAL | Status: DC
  Administered 2019-11-27 – 2019-12-02 (×6): 10 mg via ORAL
  Filled 2019-11-26 (×7): qty 1

## 2019-11-26 MED ORDER — ROCURONIUM BROMIDE 10 MG/ML (PF) SYRINGE
PREFILLED_SYRINGE | INTRAVENOUS | Status: AC
  Filled 2019-11-26: qty 10

## 2019-11-26 MED ORDER — ROCURONIUM BROMIDE 10 MG/ML (PF) SYRINGE
PREFILLED_SYRINGE | INTRAVENOUS | Status: AC
  Filled 2019-11-26: qty 20

## 2019-11-26 MED ORDER — HEPARIN SODIUM (PORCINE) 1000 UNIT/ML IJ SOLN
INTRAMUSCULAR | Status: DC | PRN
  Administered 2019-11-26: 36000 [IU] via INTRAVENOUS

## 2019-11-26 MED ORDER — CHLORHEXIDINE GLUCONATE 0.12 % MT SOLN
15.0000 mL | OROMUCOSAL | Status: AC
  Administered 2019-11-26: 15 mL via OROMUCOSAL

## 2019-11-26 MED ORDER — MORPHINE SULFATE (PF) 2 MG/ML IV SOLN
1.0000 mg | INTRAVENOUS | Status: DC | PRN
  Administered 2019-11-27 (×5): 2 mg via INTRAVENOUS
  Filled 2019-11-26 (×5): qty 1

## 2019-11-26 MED ORDER — PROTAMINE SULFATE 10 MG/ML IV SOLN
INTRAVENOUS | Status: DC | PRN
  Administered 2019-11-26: 330 mg via INTRAVENOUS
  Administered 2019-11-26: 20 mg via INTRAVENOUS

## 2019-11-26 MED ORDER — ACETAMINOPHEN 650 MG RE SUPP
650.0000 mg | Freq: Once | RECTAL | Status: AC
  Administered 2019-11-26: 650 mg via RECTAL

## 2019-11-26 MED ORDER — CHLORHEXIDINE GLUCONATE 0.12% ORAL RINSE (MEDLINE KIT)
15.0000 mL | Freq: Two times a day (BID) | OROMUCOSAL | Status: DC
  Administered 2019-11-26: 15 mL via OROMUCOSAL

## 2019-11-26 MED ORDER — PROPOFOL 10 MG/ML IV BOLUS
INTRAVENOUS | Status: AC
  Filled 2019-11-26: qty 20

## 2019-11-26 MED ORDER — THROMBIN 20000 UNITS EX SOLR: CUTANEOUS | Status: DC | PRN

## 2019-11-26 MED ORDER — SODIUM CHLORIDE (PF) 0.9 % IJ SOLN
INTRAMUSCULAR | Status: AC
  Filled 2019-11-26: qty 10

## 2019-11-26 MED ORDER — DOCUSATE SODIUM 100 MG PO CAPS
200.0000 mg | ORAL_CAPSULE | Freq: Every day | ORAL | Status: DC
  Administered 2019-11-27 – 2019-11-28 (×2): 200 mg via ORAL
  Filled 2019-11-26 (×2): qty 2

## 2019-11-26 MED ORDER — INSULIN REGULAR(HUMAN) IN NACL 100-0.9 UT/100ML-% IV SOLN
INTRAVENOUS | Status: DC
  Administered 2019-11-26: 3.8 [IU]/h via INTRAVENOUS

## 2019-11-26 MED ORDER — FENTANYL CITRATE (PF) 250 MCG/5ML IJ SOLN
INTRAMUSCULAR | Status: DC | PRN
  Administered 2019-11-26 (×3): 50 ug via INTRAVENOUS
  Administered 2019-11-26: 250 ug via INTRAVENOUS
  Administered 2019-11-26 (×3): 50 ug via INTRAVENOUS
  Administered 2019-11-26 (×2): 100 ug via INTRAVENOUS
  Administered 2019-11-26: 25 ug via INTRAVENOUS
  Administered 2019-11-26: 50 ug via INTRAVENOUS
  Administered 2019-11-26: 250 ug via INTRAVENOUS
  Administered 2019-11-26: 25 ug via INTRAVENOUS
  Administered 2019-11-26: 150 ug via INTRAVENOUS
  Administered 2019-11-26: 250 ug via INTRAVENOUS

## 2019-11-26 MED ORDER — POTASSIUM CHLORIDE 10 MEQ/50ML IV SOLN: 10.0000 meq | INTRAVENOUS | Status: AC

## 2019-11-26 MED ORDER — SODIUM CHLORIDE (PF) 0.9 % IJ SOLN: OROMUCOSAL | Status: DC | PRN

## 2019-11-26 MED ORDER — ONDANSETRON HCL 4 MG/2ML IJ SOLN
4.0000 mg | Freq: Four times a day (QID) | INTRAMUSCULAR | Status: DC | PRN
  Administered 2019-11-27: 4 mg via INTRAVENOUS
  Filled 2019-11-26: qty 2

## 2019-11-26 MED ORDER — DEXMEDETOMIDINE HCL IN NACL 400 MCG/100ML IV SOLN
0.0000 ug/kg/h | INTRAVENOUS | Status: DC
  Administered 2019-11-26: 0.7 ug/kg/h via INTRAVENOUS

## 2019-11-26 MED ORDER — ARTIFICIAL TEARS OPHTHALMIC OINT
TOPICAL_OINTMENT | OPHTHALMIC | Status: AC
  Filled 2019-11-26: qty 3.5

## 2019-11-26 MED ORDER — SODIUM BICARBONATE 8.4 % IV SOLN
50.0000 meq | Freq: Once | INTRAVENOUS | Status: AC
  Administered 2019-11-26: 50 meq via INTRAVENOUS

## 2019-11-26 MED ORDER — SODIUM CHLORIDE 0.9% FLUSH
10.0000 mL | Freq: Two times a day (BID) | INTRAVENOUS | Status: DC
  Administered 2019-11-27 – 2019-11-28 (×2): 10 mL

## 2019-11-26 MED ORDER — VANCOMYCIN HCL IN DEXTROSE 1-5 GM/200ML-% IV SOLN
1000.0000 mg | Freq: Once | INTRAVENOUS | Status: AC
  Administered 2019-11-26: 1000 mg via INTRAVENOUS
  Filled 2019-11-26: qty 200

## 2019-11-26 MED ORDER — BISACODYL 10 MG RE SUPP: 10.0000 mg | Freq: Every day | RECTAL | Status: DC

## 2019-11-26 MED ORDER — MIDAZOLAM HCL (PF) 10 MG/2ML IJ SOLN
INTRAMUSCULAR | Status: AC
  Filled 2019-11-26: qty 2

## 2019-11-26 MED ORDER — ETOMIDATE 2 MG/ML IV SOLN
INTRAVENOUS | Status: AC
  Filled 2019-11-26: qty 10

## 2019-11-26 MED ORDER — HEPARIN SODIUM (PORCINE) 1000 UNIT/ML IJ SOLN
INTRAMUSCULAR | Status: AC
  Filled 2019-11-26: qty 1

## 2019-11-26 MED ORDER — LACTATED RINGERS IV SOLN: INTRAVENOUS | Status: DC

## 2019-11-26 MED ORDER — MIDAZOLAM HCL 2 MG/2ML IJ SOLN: 2.0000 mg | INTRAMUSCULAR | Status: DC | PRN

## 2019-11-26 MED ORDER — TRAMADOL HCL 50 MG PO TABS: 50.0000 mg | ORAL_TABLET | ORAL | Status: DC | PRN

## 2019-11-26 MED ORDER — HEMOSTATIC AGENTS (NO CHARGE) OPTIME
TOPICAL | Status: DC | PRN
  Administered 2019-11-26: 1 via TOPICAL

## 2019-11-26 MED ORDER — SODIUM CHLORIDE 0.9% FLUSH
10.0000 mL | INTRAVENOUS | Status: DC | PRN
  Administered 2019-11-28: 10 mL

## 2019-11-26 MED ORDER — SODIUM CHLORIDE 0.45 % IV SOLN: INTRAVENOUS | Status: DC | PRN

## 2019-11-26 MED ORDER — CHLORHEXIDINE GLUCONATE 0.12 % MT SOLN
15.0000 mL | Freq: Once | OROMUCOSAL | Status: AC
  Administered 2019-11-26 – 2019-11-27 (×2): 15 mL via OROMUCOSAL
  Filled 2019-11-26: qty 15

## 2019-11-26 MED ORDER — INSULIN REGULAR(HUMAN) IN NACL 100-0.9 UT/100ML-% IV SOLN
INTRAVENOUS | Status: DC
  Filled 2019-11-26: qty 100

## 2019-11-26 MED ORDER — ASPIRIN EC 325 MG PO TBEC
325.0000 mg | DELAYED_RELEASE_TABLET | Freq: Every day | ORAL | Status: DC
  Administered 2019-11-27 – 2019-11-28 (×2): 325 mg via ORAL
  Filled 2019-11-26 (×2): qty 1

## 2019-11-26 MED ORDER — MAGNESIUM SULFATE 4 GM/100ML IV SOLN
4.0000 g | Freq: Once | INTRAVENOUS | Status: AC
  Administered 2019-11-26: 4 g via INTRAVENOUS
  Filled 2019-11-26: qty 100

## 2019-11-26 MED ORDER — ACETAMINOPHEN 160 MG/5ML PO SOLN: 650.0000 mg | Freq: Once | ORAL | Status: AC

## 2019-11-26 MED ORDER — SODIUM CHLORIDE 0.9% FLUSH: 3.0000 mL | INTRAVENOUS | Status: DC | PRN

## 2019-11-26 MED ORDER — OXYCODONE HCL 5 MG PO TABS
5.0000 mg | ORAL_TABLET | ORAL | Status: DC | PRN
  Administered 2019-11-27: 5 mg via ORAL
  Filled 2019-11-26: qty 1

## 2019-11-26 MED ORDER — LACTATED RINGERS IV SOLN: 500.0000 mL | Freq: Once | INTRAVENOUS | Status: DC | PRN

## 2019-11-26 MED ORDER — PANTOPRAZOLE SODIUM 40 MG PO TBEC
40.0000 mg | DELAYED_RELEASE_TABLET | Freq: Every day | ORAL | Status: DC
  Administered 2019-11-28: 40 mg via ORAL
  Filled 2019-11-26: qty 1

## 2019-11-26 MED ORDER — PHENYLEPHRINE 40 MCG/ML (10ML) SYRINGE FOR IV PUSH (FOR BLOOD PRESSURE SUPPORT)
PREFILLED_SYRINGE | INTRAVENOUS | Status: AC
  Filled 2019-11-26: qty 10

## 2019-11-26 MED ORDER — SUCCINYLCHOLINE CHLORIDE 200 MG/10ML IV SOSY
PREFILLED_SYRINGE | INTRAVENOUS | Status: AC
  Filled 2019-11-26: qty 10

## 2019-11-26 MED ORDER — ARTIFICIAL TEARS OPHTHALMIC OINT
TOPICAL_OINTMENT | OPHTHALMIC | Status: DC | PRN
  Administered 2019-11-26: 1 via OPHTHALMIC

## 2019-11-26 MED ORDER — METOPROLOL TARTRATE 25 MG/10 ML ORAL SUSPENSION
12.5000 mg | Freq: Two times a day (BID) | ORAL | Status: DC
  Administered 2019-11-26: 12.5 mg
  Filled 2019-11-26: qty 5

## 2019-11-26 MED ORDER — ALBUMIN HUMAN 5 % IV SOLN
INTRAVENOUS | Status: DC | PRN
Start: 2019-11-26 — End: 2019-11-26

## 2019-11-26 MED ORDER — ALBUMIN HUMAN 5 % IV SOLN: 250.0000 mL | INTRAVENOUS | Status: DC | PRN

## 2019-11-26 MED ORDER — ACETAMINOPHEN 160 MG/5ML PO SOLN: 1000.0000 mg | Freq: Four times a day (QID) | ORAL | Status: DC

## 2019-11-26 MED ORDER — CHLORHEXIDINE GLUCONATE CLOTH 2 % EX PADS
6.0000 | MEDICATED_PAD | Freq: Every day | CUTANEOUS | Status: DC
  Administered 2019-11-26 – 2019-11-27 (×2): 6 via TOPICAL

## 2019-11-26 MED ORDER — ROCURONIUM BROMIDE 10 MG/ML (PF) SYRINGE
PREFILLED_SYRINGE | INTRAVENOUS | Status: DC | PRN
  Administered 2019-11-26 (×2): 50 mg via INTRAVENOUS
  Administered 2019-11-26: 100 mg via INTRAVENOUS
  Administered 2019-11-26 (×3): 50 mg via INTRAVENOUS

## 2019-11-26 MED ORDER — SODIUM CHLORIDE 0.9 % IV SOLN: INTRAVENOUS | Status: DC

## 2019-11-26 MED ORDER — ASPIRIN 81 MG PO CHEW: 324.0000 mg | CHEWABLE_TABLET | Freq: Every day | ORAL | Status: DC

## 2019-11-26 MED ORDER — SODIUM CHLORIDE 0.9 % IV SOLN: 250.0000 mL | INTRAVENOUS | Status: DC

## 2019-11-26 MED ORDER — PHENYLEPHRINE 40 MCG/ML (10ML) SYRINGE FOR IV PUSH (FOR BLOOD PRESSURE SUPPORT)
PREFILLED_SYRINGE | INTRAVENOUS | Status: DC | PRN
  Administered 2019-11-26: 40 ug via INTRAVENOUS

## 2019-11-26 MED ORDER — SODIUM CHLORIDE 0.9 % IV SOLN
INTRAVENOUS | Status: DC | PRN
Start: 2019-11-26 — End: 2019-11-26

## 2019-11-26 MED ORDER — PROTAMINE SULFATE 10 MG/ML IV SOLN
INTRAVENOUS | Status: AC
  Filled 2019-11-26: qty 25

## 2019-11-26 MED ORDER — ACETAMINOPHEN 500 MG PO TABS
1000.0000 mg | ORAL_TABLET | Freq: Four times a day (QID) | ORAL | Status: DC
  Administered 2019-11-27 – 2019-11-28 (×6): 1000 mg via ORAL
  Filled 2019-11-26 (×6): qty 2

## 2019-11-26 MED ORDER — PLASMA-LYTE 148 IV SOLN
INTRAVENOUS | Status: DC | PRN
  Administered 2019-11-26: 500 mL

## 2019-11-26 MED ORDER — MIDAZOLAM HCL 5 MG/5ML IJ SOLN
INTRAMUSCULAR | Status: DC | PRN
  Administered 2019-11-26: 2 mg via INTRAVENOUS
  Administered 2019-11-26 (×3): 1 mg via INTRAVENOUS
  Administered 2019-11-26: 2 mg via INTRAVENOUS
  Administered 2019-11-26: 1 mg via INTRAVENOUS
  Administered 2019-11-26: 2 mg via INTRAVENOUS

## 2019-11-26 MED ORDER — SUCCINYLCHOLINE CHLORIDE 200 MG/10ML IV SOSY
PREFILLED_SYRINGE | INTRAVENOUS | Status: DC | PRN
  Administered 2019-11-26: 180 mg via INTRAVENOUS

## 2019-11-26 MED ORDER — BISACODYL 5 MG PO TBEC
10.0000 mg | DELAYED_RELEASE_TABLET | Freq: Every day | ORAL | Status: DC
  Administered 2019-11-27 – 2019-11-28 (×2): 10 mg via ORAL
  Filled 2019-11-26 (×2): qty 2

## 2019-11-26 MED ORDER — FENTANYL CITRATE (PF) 250 MCG/5ML IJ SOLN
INTRAMUSCULAR | Status: AC
  Filled 2019-11-26: qty 25

## 2019-11-26 MED ORDER — ROSUVASTATIN CALCIUM 5 MG PO TABS: 5.0000 mg | ORAL_TABLET | Freq: Every day | ORAL | Status: DC

## 2019-11-26 MED ORDER — LACTATED RINGERS IV SOLN
INTRAVENOUS | Status: DC | PRN
Start: 2019-11-26 — End: 2019-11-26

## 2019-11-26 MED ORDER — PROPOFOL 10 MG/ML IV BOLUS
INTRAVENOUS | Status: DC | PRN
  Administered 2019-11-26: 10 mg via INTRAVENOUS
  Administered 2019-11-26: 30 mg via INTRAVENOUS
  Administered 2019-11-26 (×3): 10 mg via INTRAVENOUS

## 2019-11-26 MED ORDER — PROTAMINE SULFATE 10 MG/ML IV SOLN
INTRAVENOUS | Status: AC
  Filled 2019-11-26: qty 10

## 2019-11-26 MED ORDER — DEXTROSE 50 % IV SOLN: 0.0000 mL | INTRAVENOUS | Status: DC | PRN

## 2019-11-26 MED ORDER — THROMBIN 20000 UNITS EX SOLR
OROMUCOSAL | Status: DC | PRN
  Administered 2019-11-26 (×4): 4 mL via TOPICAL

## 2019-11-26 MED ORDER — LEVOFLOXACIN IN D5W 750 MG/150ML IV SOLN
750.0000 mg | INTRAVENOUS | Status: AC
  Administered 2019-11-27: 750 mg via INTRAVENOUS
  Filled 2019-11-26: qty 150

## 2019-11-26 MED ORDER — THROMBIN 20000 UNITS EX SOLR
CUTANEOUS | Status: DC | PRN
  Administered 2019-11-26: 20000 [IU] via TOPICAL

## 2019-11-26 SURGICAL SUPPLY — 120 items
ADAPTER CARDIO PERF ANTE/RETRO (ADAPTER) ×4 IMPLANT
BAG DECANTER FOR FLEXI CONT (MISCELLANEOUS) ×4 IMPLANT
BLADE CLIPPER SURG (BLADE) ×4 IMPLANT
BLADE STERNUM SYSTEM 6 (BLADE) ×4 IMPLANT
BLADE SURG 15 STRL LF DISP TIS (BLADE) ×2 IMPLANT
BLADE SURG 15 STRL SS (BLADE) ×2
BNDG ELASTIC 4X5.8 VLCR STR LF (GAUZE/BANDAGES/DRESSINGS) ×4 IMPLANT
BNDG ELASTIC 6X10 VLCR STRL LF (GAUZE/BANDAGES/DRESSINGS) ×2 IMPLANT
BNDG ELASTIC 6X5.8 VLCR STR LF (GAUZE/BANDAGES/DRESSINGS) ×4 IMPLANT
BNDG GAUZE ELAST 4 BULKY (GAUZE/BANDAGES/DRESSINGS) ×4 IMPLANT
CANISTER SUCT 3000ML PPV (MISCELLANEOUS) ×4 IMPLANT
CANNULA ARTERIAL NVNT 3/8 22FR (MISCELLANEOUS) ×2 IMPLANT
CANNULA GUNDRY RCSP 15FR (MISCELLANEOUS) ×4 IMPLANT
CATH HEART VENT LEFT (CATHETERS) ×2 IMPLANT
CATH ROBINSON RED A/P 18FR (CATHETERS) ×12 IMPLANT
CATH THORACIC 28FR (CATHETERS) ×4 IMPLANT
CATH THORACIC 36FR (CATHETERS) ×4 IMPLANT
CATH THORACIC 36FR RT ANG (CATHETERS) ×4 IMPLANT
CLIP VESOCCLUDE MED 24/CT (CLIP) IMPLANT
CLIP VESOCCLUDE SM WIDE 24/CT (CLIP) ×2 IMPLANT
CNTNR URN SCR LID CUP LEK RST (MISCELLANEOUS) ×2 IMPLANT
CONT SPEC 4OZ STRL OR WHT (MISCELLANEOUS) ×2
COVER MAYO STAND STRL (DRAPES) ×2 IMPLANT
COVER SURGICAL LIGHT HANDLE (MISCELLANEOUS) ×4 IMPLANT
DRAPE CARDIOVASCULAR INCISE (DRAPES) ×2
DRAPE SLUSH/WARMER DISC (DRAPES) ×4 IMPLANT
DRAPE SRG 135X102X78XABS (DRAPES) ×2 IMPLANT
DRSG COVADERM 4X14 (GAUZE/BANDAGES/DRESSINGS) ×4 IMPLANT
ELECT CAUTERY BLADE 6.4 (BLADE) ×4 IMPLANT
ELECT REM PT RETURN 9FT ADLT (ELECTROSURGICAL) ×8
ELECTRODE REM PT RTRN 9FT ADLT (ELECTROSURGICAL) ×4 IMPLANT
FELT TEFLON 1X6 (MISCELLANEOUS) ×8 IMPLANT
GAUZE SPONGE 4X4 12PLY STRL (GAUZE/BANDAGES/DRESSINGS) ×8 IMPLANT
GAUZE SPONGE 4X4 12PLY STRL LF (GAUZE/BANDAGES/DRESSINGS) ×4 IMPLANT
GLOVE BIO SURGEON STRL SZ 6 (GLOVE) IMPLANT
GLOVE BIO SURGEON STRL SZ 6.5 (GLOVE) ×9 IMPLANT
GLOVE BIO SURGEON STRL SZ7 (GLOVE) IMPLANT
GLOVE BIO SURGEON STRL SZ7.5 (GLOVE) IMPLANT
GLOVE BIO SURGEONS STRL SZ 6.5 (GLOVE) ×9
GLOVE BIOGEL PI IND STRL 6 (GLOVE) IMPLANT
GLOVE BIOGEL PI IND STRL 6.5 (GLOVE) IMPLANT
GLOVE BIOGEL PI IND STRL 7.0 (GLOVE) IMPLANT
GLOVE BIOGEL PI INDICATOR 6 (GLOVE)
GLOVE BIOGEL PI INDICATOR 6.5 (GLOVE)
GLOVE BIOGEL PI INDICATOR 7.0 (GLOVE)
GLOVE ORTHO TXT STRL SZ7.5 (GLOVE) IMPLANT
GLOVE SS BIOGEL STRL SZ 7 (GLOVE) ×4 IMPLANT
GLOVE SUPERSENSE BIOGEL SZ 7 (GLOVE) ×4
GLOVE SURG SS PI 6.0 STRL IVOR (GLOVE) ×4 IMPLANT
GOWN STRL REUS W/ TWL LRG LVL3 (GOWN DISPOSABLE) ×8 IMPLANT
GOWN STRL REUS W/ TWL XL LVL3 (GOWN DISPOSABLE) ×2 IMPLANT
GOWN STRL REUS W/TWL LRG LVL3 (GOWN DISPOSABLE) ×14
GOWN STRL REUS W/TWL XL LVL3 (GOWN DISPOSABLE) ×8
HEMOSTAT POWDER SURGIFOAM 1G (HEMOSTASIS) ×12 IMPLANT
INSERT FOGARTY 61MM (MISCELLANEOUS) IMPLANT
INSERT FOGARTY XLG (MISCELLANEOUS) ×2 IMPLANT
KIT BASIN OR (CUSTOM PROCEDURE TRAY) ×4 IMPLANT
KIT CATH CPB BARTLE (MISCELLANEOUS) ×4 IMPLANT
KIT SUCTION CATH 14FR (SUCTIONS) ×4 IMPLANT
KIT TURNOVER KIT B (KITS) ×4 IMPLANT
KIT VASOVIEW HEMOPRO 2 VH 4000 (KITS) ×4 IMPLANT
LINE VENT (MISCELLANEOUS) ×4 IMPLANT
NS IRRIG 1000ML POUR BTL (IV SOLUTION) ×24 IMPLANT
PACK E OPEN HEART (SUTURE) ×4 IMPLANT
PACK OPEN HEART (CUSTOM PROCEDURE TRAY) ×4 IMPLANT
PAD ARMBOARD 7.5X6 YLW CONV (MISCELLANEOUS) ×8 IMPLANT
PAD ELECT DEFIB RADIOL ZOLL (MISCELLANEOUS) ×4 IMPLANT
PENCIL BUTTON HOLSTER BLD 10FT (ELECTRODE) ×4 IMPLANT
POSITIONER HEAD DONUT 9IN (MISCELLANEOUS) ×4 IMPLANT
PUNCH AORTIC ROTATE 4.0MM (MISCELLANEOUS) IMPLANT
PUNCH AORTIC ROTATE 4.5MM 8IN (MISCELLANEOUS) ×4 IMPLANT
PUNCH AORTIC ROTATE 5MM 8IN (MISCELLANEOUS) IMPLANT
SET CARDIOPLEGIA MPS 5001102 (MISCELLANEOUS) ×2 IMPLANT
SPONGE INTESTINAL PEANUT (DISPOSABLE) IMPLANT
SPONGE LAP 18X18 RF (DISPOSABLE) ×4 IMPLANT
SPONGE LAP 4X18 RFD (DISPOSABLE) ×8 IMPLANT
SUPPORT HEART JANKE-BARRON (MISCELLANEOUS) ×4 IMPLANT
SUT BONE WAX W31G (SUTURE) ×4 IMPLANT
SUT ETHIBON 2 0 V 52N 30 (SUTURE) ×10 IMPLANT
SUT ETHIBON EXCEL 2-0 V-5 (SUTURE) ×4 IMPLANT
SUT ETHIBOND V-5 VALVE (SUTURE) ×8 IMPLANT
SUT MNCRL AB 4-0 PS2 18 (SUTURE) ×2 IMPLANT
SUT PROLENE 3 0 SH DA (SUTURE) IMPLANT
SUT PROLENE 3 0 SH1 36 (SUTURE) ×4 IMPLANT
SUT PROLENE 4 0 RB 1 (SUTURE) ×10
SUT PROLENE 4 0 SH DA (SUTURE) IMPLANT
SUT PROLENE 4-0 RB1 .5 CRCL 36 (SUTURE) ×6 IMPLANT
SUT PROLENE 5 0 C 1 36 (SUTURE) IMPLANT
SUT PROLENE 6 0 C 1 30 (SUTURE) IMPLANT
SUT PROLENE 7 0 BV 1 (SUTURE) IMPLANT
SUT PROLENE 7 0 BV1 MDA (SUTURE) ×4 IMPLANT
SUT PROLENE 8 0 BV175 6 (SUTURE) IMPLANT
SUT SILK  1 MH (SUTURE)
SUT SILK 1 MH (SUTURE) IMPLANT
SUT SILK 2 0 SH (SUTURE) IMPLANT
SUT SILK 2 0 SH CR/8 (SUTURE) ×2 IMPLANT
SUT STEEL 6MS V (SUTURE) IMPLANT
SUT STEEL STERNAL CCS#1 18IN (SUTURE) IMPLANT
SUT STEEL SZ 6 DBL 3X14 BALL (SUTURE) ×6 IMPLANT
SUT VIC AB 1 CTX 36 (SUTURE) ×8
SUT VIC AB 1 CTX36XBRD ANBCTR (SUTURE) ×4 IMPLANT
SUT VIC AB 2-0 CT1 27 (SUTURE) ×2
SUT VIC AB 2-0 CT1 TAPERPNT 27 (SUTURE) IMPLANT
SUT VIC AB 2-0 CTX 27 (SUTURE) IMPLANT
SUT VIC AB 3-0 SH 27 (SUTURE)
SUT VIC AB 3-0 SH 27X BRD (SUTURE) IMPLANT
SUT VIC AB 3-0 X1 27 (SUTURE) IMPLANT
SUT VICRYL 4-0 PS2 18IN ABS (SUTURE) IMPLANT
SYSTEM SAHARA CHEST DRAIN ATS (WOUND CARE) ×4 IMPLANT
TAPE CLOTH SURG 4X10 WHT LF (GAUZE/BANDAGES/DRESSINGS) ×2 IMPLANT
TAPE PAPER 2X10 WHT MICROPORE (GAUZE/BANDAGES/DRESSINGS) ×2 IMPLANT
TOWEL GREEN STERILE (TOWEL DISPOSABLE) ×4 IMPLANT
TOWEL GREEN STERILE FF (TOWEL DISPOSABLE) ×4 IMPLANT
TRAY FOLEY SLVR 16FR TEMP STAT (SET/KITS/TRAYS/PACK) ×4 IMPLANT
TUBING LAP HI FLOW INSUFFLATIO (TUBING) ×4 IMPLANT
UNDERPAD 30X36 HEAVY ABSORB (UNDERPADS AND DIAPERS) ×4 IMPLANT
VALVE AORTIC SZ23 INSP/RESIL (Prosthesis & Implant Heart) ×2 IMPLANT
VENT LEFT HEART 12002 (CATHETERS) ×8
WATER STERILE IRR 1000ML POUR (IV SOLUTION) ×8 IMPLANT
YANKAUER SUCT BULB TIP NO VENT (SUCTIONS) ×2 IMPLANT

## 2019-11-26 NOTE — Anesthesia Procedure Notes (Signed)
Procedure Name: Intubation Date/Time: 11/26/2019 7:49 AM Performed by: Wilburn Cornelia, CRNA Pre-anesthesia Checklist: Patient identified, Emergency Drugs available, Suction available, Patient being monitored and Timeout performed Patient Re-evaluated:Patient Re-evaluated prior to induction Oxygen Delivery Method: Circle system utilized Preoxygenation: Pre-oxygenation with 100% oxygen Induction Type: IV induction and Rapid sequence Ventilation: Mask ventilation with difficulty Laryngoscope Size: Mac and 4 Grade View: Grade II Tube type: Oral Tube size: 8.0 mm Number of attempts: 1 Airway Equipment and Method: Stylet Placement Confirmation: ETT inserted through vocal cords under direct vision,  positive ETCO2,  CO2 detector and breath sounds checked- equal and bilateral Secured at: 24 cm Tube secured with: Tape Dental Injury: Teeth and Oropharynx as per pre-operative assessment

## 2019-11-26 NOTE — Anesthesia Procedure Notes (Signed)
Arterial Line Insertion Start/End6/12/2019 6:50 AM, 11/26/2019 6:55 AM Performed by: Wilburn Cornelia, CRNA, CRNA  Preanesthetic checklist: patient identified, IV checked, site marked, risks and benefits discussed, surgical consent, monitors and equipment checked, pre-op evaluation, timeout performed and anesthesia consent Lidocaine 1% used for infiltration and patient sedated Left, radial was placed Catheter size: 20 G Hand hygiene performed  and maximum sterile barriers used  Allen's test indicative of satisfactory collateral circulation Attempts: 1 Procedure performed without using ultrasound guided technique. Following insertion, Biopatch and dressing applied. Post procedure assessment: normal  Patient tolerated the procedure well with no immediate complications.

## 2019-11-26 NOTE — Progress Notes (Signed)
  Echocardiogram Echocardiogram Transesophageal has been performed.  Michiel Cowboy 11/26/2019, 8:47 AM

## 2019-11-26 NOTE — Brief Op Note (Signed)
11/26/2019  1:12 PM  PATIENT:  Jeffery West  64 y.o. male  PRE-OPERATIVE DIAGNOSIS:  Aortic Stenosis Coronary Artery Disease   POST-OPERATIVE DIAGNOSIS:  Aortic Stenosis Coronary Artery Disease  PROCEDURE:  Procedure(s): CORONARY ARTERY BYPASS GRAFTING (CABG) X Two , using left internal mammary artery and right leg greater saphenous vein harvested endoscopically. (N/A) AORTIC VALVE REPLACEMENT (AVR) USING INSPIRIS AORTIC VALVE SIZE 23MM (N/A) TRANSESOPHAGEAL ECHOCARDIOGRAM (TEE) (N/A)  LIMA to LAD SVG to Diag1  SURGEON:  Surgeon(s) and Role:    * Bartle, Fernande Boyden, MD - Primary  PHYSICIAN ASSISTANT:  Nicholes Rough, PA-C   ANESTHESIA:   general  EBL:  1005 mL   BLOOD ADMINISTERED:none  DRAINS:  routine    LOCAL MEDICATIONS USED:  NONE  SPECIMEN: Aortic valve leaflets  DISPOSITION OF SPECIMEN:  PATHOLOGY  COUNTS:  YES  DICTATION: .Dragon Dictation  PLAN OF CARE: Admit to inpatient   PATIENT DISPOSITION:  ICU - intubated and hemodynamically stable.   Delay start of Pharmacological VTE agent (>24hrs) due to surgical blood loss or risk of bleeding: yes

## 2019-11-26 NOTE — Interval H&P Note (Signed)
History and Physical Interval Note:  11/26/2019 7:29 AM  Jeffery West  has presented today for surgery, with the diagnosis of AS CAD.  The various methods of treatment have been discussed with the patient and family. After consideration of risks, benefits and other options for treatment, the patient has consented to  Procedure(s): CORONARY ARTERY BYPASS GRAFTING (CABG) (N/A) AORTIC VALVE REPLACEMENT (AVR) (N/A) TRANSESOPHAGEAL ECHOCARDIOGRAM (TEE) (N/A) as a surgical intervention.  The patient's history has been reviewed, patient examined, no change in status, stable for surgery.  I have reviewed the patient's chart and labs.  Questions were answered to the patient's satisfaction.     Gaye Pollack

## 2019-11-26 NOTE — Anesthesia Procedure Notes (Signed)
Central Venous Catheter Insertion Performed by: Annye Asa, MD, anesthesiologist Start/End6/12/2019 6:34 AM, 11/26/2019 6:46 AM Patient location: Pre-op. Preanesthetic checklist: patient identified, IV checked, site marked, risks and benefits discussed, surgical consent, monitors and equipment checked, pre-op evaluation, timeout performed and anesthesia consent Position: Trendelenburg Lidocaine 1% used for infiltration and patient sedated Hand hygiene performed , maximum sterile barriers used  and Seldinger technique used Catheter size: 8.5 Fr PA cath was placed.Sheath introducer Swan type:thermodilution Procedure performed using ultrasound guided technique. Ultrasound Notes:anatomy identified, needle tip was noted to be adjacent to the nerve/plexus identified, no ultrasound evidence of intravascular and/or intraneural injection and image(s) printed for medical record Attempts: 1 Following insertion, line sutured, dressing applied and Biopatch. Post procedure assessment: blood return through all ports, free fluid flow and no air  Patient tolerated the procedure well with no immediate complications. Additional procedure comments: PA catheter:  Routine monitors. Timeout, sterile prep, drape, FBP R neck.  Trendelenburg position.  1% Lido local, finder and trocar RIJ 1st pass with US guidance.  Cordis placed over J wire. PA catheter in easily.  Sterile dressing applied.  Patient tolerated well, VSS.  Jenita Seashore, MD.

## 2019-11-26 NOTE — Op Note (Signed)
CARDIOVASCULAR SURGERY OPERATIVE NOTE  11/26/2019  Surgeon:  Gaye Pollack, MD  First Assistant: Nicholes Rough, PA-C   Preoperative Diagnosis:  Severe single-vessel coronary artery disease and severe aortic stenosis   Postoperative Diagnosis:  Same   Procedure:  1. Median Sternotomy 2. Extracorporeal circulation 3.   Coronary artery bypass grafting x 2   Left internal mammary artery graft to the LAD  SVG to first diagonal   4.   Endoscopic vein harvest from the right leg  5.   Aortic valve replacement using a 23 mm Edwards INSPIRIS RESILIA pericardial valve   Anesthesia:  General Endotracheal   Clinical History/Surgical Indication:  This 64 year old diabetic gentleman has stage D, critical aortic stenosis and severe single-vessel coronary disease with New York Heart Association class II symptoms of exertional fatigue and shortness of breath. I have personally reviewed his 2D echocardiogram and cardiac catheterization images. His echocardiogram shows a heavily calcified aortic valve with restricted mobility. The mean gradient is 53.5 mmHg consistent with critical aortic stenosis. It is not possible to tell how many leaflets there are. The aorta is of normal size. Cardiac catheterization shows severe single-vessel coronary disease with an 80% mid LAD stenosis and a 70% first diagonal stenosis. I think coronary bypass graft surgery and aortic valve replacement is the best treatment for this gentleman given his relatively young age and presence of poorly controlled diabetes. TAVR is not recommended for patients less than 70 years of age due to lack of long-term durability data. I reviewed the echocardiogram and cardiac catheterization images with the patient and his wife and answered all their questions. I discussed the alternatives of mechanical and bioprosthetic valves. We reviewed the pros  and cons of both and he and his wife feel that a bioprosthetic valve would be the best option for him so that he does not need to be on Coumadin. He has significant arthritis and takes nonsteroidal anti-inflammatory agents on a routine basis to remain functional. I think the current generation of pericardial bioprosthesis will have good long-term durability for him.  I discussed the operative procedure with the patient and his wife including alternatives, benefits and risks; including but not limited to bleeding, blood transfusion, infection, stroke, myocardial infarction, graft failure, heart block requiring a permanent pacemaker, organ dysfunction, and death. [name] understands and agrees to proceed.   Preparation:  The patient was seen in the preoperative holding area and the correct patient, correct operation were confirmed with the patient after reviewing the medical record and catheterization. The consent was signed by me. Preoperative antibiotics were given. A pulmonary arterial line and radial arterial line were placed by the anesthesia team. The patient was taken back to the operating room and positioned supine on the operating room table. After being placed under general endotracheal anesthesia by the anesthesia team a foley catheter was placed. The neck, chest, abdomen, and both legs were prepped with betadine soap and solution and draped in the usual sterile manner. A surgical time-out was taken and the correct patient and operative procedure were confirmed with the nursing and anesthesia staff.  *INTRAOPERATIVE TRANSESOPHAGEAL REPORT *      Patient Name:  Jeffery West Date of Exam: 11/26/2019  Medical Rec #: [id number]   Height:    71.0 in  Accession #:  [id number]   Weight:    262.0 lb  Date of Birth: 1955-07-06   BSA:     2.36 m  Patient Age:  64 years  BP:      171/82 mmHg  Patient Gender: M       HR:      72 bpm.  Exam  Location: Anesthesiology   Transesophogeal exam was perform intraoperatively during surgical  procedure.  Patient was closely monitored under general anesthesia during the entirety  of  examination.   Indications:   Aortic valve disease, CABG  Sonographer:   Vickie Epley RDCS  Performing Phys: [id number] Gaye Pollack  Diagnosing Phys: Annye Asa MD   Complications: No known complications during this procedure.  POST-OP IMPRESSIONS  - Left Ventricle: The left ventricle is unchanged from pre-bypass. The LV  has  hyperdynamic systolic function, unchanged from pre-op, with an ejection  fraction  of 60-70%. There are no wall motion abnormalities.  - Aortic Valve: A bioprosthetic bioprosthetic valve was placed, leaflets  are  freely mobile. There is no aortic regurgitation, and no perivalvular leak.  There  is no stenosis, with post aortic valve replacement peak gradient 14 mmHg,  mean  gradient 8 mmHg.  - Mitral Valve: The mitral valve appears unchanged from pre-bypass, with  trivial-mild Mitral regurgitation remaining.  - Tricuspid Valve: The tricuspid valve appears unchanged from pre-bypass.  There  is trivial-mild regurgitation.    The intra-atria septal remains bowed to the right. There is Color doppler  evidence of a PFO, with moderate Left-right shunt.   PRE-OP FINDINGS  Left Ventricle: The left ventricle has hyperdynamic systolic function,  with an ejection fraction of >65%, measured 70%. The cavity size was  normal. There is mildly increased left ventricular wall thickness. No  evidence of left ventricular regional wall  motion abnormalities.   Right Ventricle: The right ventricle has normal systolic function. The  cavity was normal. There is no increase in right ventricular wall  thickness.   Left Atrium: Left atrial size was dilated. The left atrial appendage is  well visualized and there is no evidence of thrombus present. Left atrial  appendage velocity  is normal at greater than 40 cm/s.   Right Atrium: Right atrial size was normal in size.   Interatrial Septum: No atrial level shunt detected by color flow Doppler.  There is extreme right bowing of the interatrial septum, suggestive of  elevated left atrial pressure.   Pericardium: There is no evidence of pericardial effusion.   Mitral Valve: The mitral valve is normal in structure. No thickening of  the mitral valve leaflets. No calcification of the mitral valve leaflets.  Mitral valve regurgitation is trivial to mild by color flow Doppler. The  MR jet is centrally-directed. There  is no evidence of mitral valve vegetation. Pulmonary venous flow is  normal. There is No evidence of mitral stenosis, with mean gradient 1  mmHg.   Tricuspid Valve: The tricuspid valve was normal in structure. Tricuspid  valve regurgitation is trivial by color flow Doppler. There is no evidence  of tricuspid valve vegetation.   Aortic Valve: The aortic valve is tricuspid. There is mild thickening of  the aortic valve, with severe calcifcation of the aortic valve leaflets.  Aortic valve regurgitation is trivial by color flow Doppler. There is  severe stenosis of the aortic valve,  with peak gradient 68 mmHg, mean gradient 37 mmHg, Vmax 413 cm/s. AVA  (VTI) 0.97 cm2, AVA (planimetry) 0.95 cm2. There is mild aortic annular  calcification noted.   Pulmonic Valve: The pulmonic valve was normal in structure, with normal  function. No evidence of pumonic stenosis. Pulmonic valve  regurgitation is  trivial, around the PA catheter, by color flow Doppler. There is normal  leaflet excursion. There is no  pulmonic stenosis.   Aorta: The aortic root and ascending aorta are normal in size and  structure. There is evidence of scant plaque in the descending aorta;  Grade I, measuring 1-8mm in size.   Pulmonary Artery: Gordy Councilman catheter present on the right. The pulmonary  artery is of normal size.   Venous:  The inferior vena cava is normal in size with less than 50%  respiratory variability, suggesting right atrial pressure of 8 mmHg.   +--------------+--------++  LEFT VENTRICLE      +--------------+--------++  PLAX 2D          +--------------+--------++  LVIDd:    5.30 cm   +--------------+--------++  LVIDs:    3.20 cm   +--------------+--------++  LVOT diam:  2.10 cm   +--------------+--------++  LV SV:    94 ml    +--------------+--------++  LV SV Index: 37.99    +--------------+--------++  LVOT Area:  3.46 cm  +--------------+--------++               +--------------+--------++   +------------------+------------++  AORTIC VALVE           +------------------+------------++  AV Area (Vmax):  1.17 cm    +------------------+------------++  AV Area (Vmean): 1.39 cm    +------------------+------------++  AV Area (VTI):  1.45 cm    +------------------+------------++  AV Vmax:     253.00 cm/s   +------------------+------------++  AV Vmean:     171.667 cm/s  +------------------+------------++  AV VTI:      0.542 m     +------------------+------------++  AV Peak Grad:   68 mmHg     +------------------+------------++  AV Mean Grad:   37 mmHg     +------------------+------------++  LVOT Vmax:    85.20 cm/s   +------------------+------------++  LVOT Vmean:    69.100 cm/s   +------------------+------------++  LVOT VTI:     0.226 m     +------------------+------------++  LVOT/AV VTI ratio:0.42      +------------------+------------++   +-------------+---------++  MITRAL VALVE       +--------------+-------+  +-------------+---------++ SHUNTS          MV Peak grad:2.3 mmHg  +--------------+-------+  +-------------+---------++ Systemic VTI: 0.23 m   MV Mean  grad:1.0 mmHg  +--------------+-------+  +-------------+---------++ Systemic Diam:2.10 cm  MV Vmax:   0.75 m/s  +--------------+-------+  +-------------+---------++  MV Vmean:  58.2 cm/s  +-------------+---------++  MV VTI:   0.27 m    +-------------+---------++     Annye Asa MD  Electronically signed by Annye Asa MD  Signature Date/Time: 11/26/2019/6:26:07 PM    Cardiopulmonary Bypass:  A median sternotomy was performed. The pericardium was opened in the midline. Right ventricular function appeared normal. The ascending aorta was of normal size and had no palpable plaque. There were no contraindications to aortic cannulation or cross-clamping. The patient was fully systemically heparinized and the ACT was maintained > 400 sec. The proximal aortic arch was cannulated with a 25 F aortic cannula for arterial inflow. Venous cannulation was performed via the right atrial appendage using a two-staged venous cannula. An antegrade cardioplegia/vent cannula was inserted into the mid-ascending aorta. Aortic occlusion was performed with a single cross-clamp. Systemic cooling to 32 degrees Centigrade and topical cooling of the heart with iced saline were used. Hyperkalemic antegrade cold blood cardioplegia was used to induce diastolic arrest and was then given at about 20 minute  intervals into the coronary ostia throughout the period of arrest to maintain myocardial temperature at or below 10 degrees centigrade. I attempted to place a retrograde cardioplegia cannula but could not get it to pass into the coronary sinus. A temperature probe was inserted into the interventricular septum and an insulating pad was placed in the pericardium.   Left internal mammary artery harvest:  The left side of the sternum was retracted using the Rultract retractor. The left internal mammary artery was harvested as a pedicle graft. All side branches were clipped. It was a medium-sized  vessel of good quality with excellent blood flow. It was ligated distally and divided. It was sprayed with topical papaverine solution to prevent vasospasm.   Endoscopic vein harvest:  The right greater saphenous vein was harvested endoscopically through a 2 cm incision medial to the right knee. It was harvested from the thigh.  It was a medium-sized vein of good quality. The side branches were all ligated with 4-0 silk ties.    Coronary arteries:  The coronary arteries were examined.   LAD:  Proximal disease but distally no disease. Diagonal medium caliber vessel with no distal disease.  LCX:  No stenosis on cath.  RCA:  No stenosis on cath.   Grafts:  1. LIMA to the LAD: 2.0 mm. It was sewn end to side using 8-0 prolene continuous suture. 2. SVG to diagonal:  1.6 mm. It was sewn end to side using 7-0 prolene continuous suture.  The proximal vein graft anastomosis was performed to the mid-ascending aorta using continuoeus 6-0 prolene suture. A graft markers was placed around the proximal anastomosis.  Aortic Valve Replacement:   A transverse aortotomy was performed 1 cm above the take-off of the right coronary artery. The native valve was trileaflet with calcified leaflets and moderate annular calcification. There was also a rim of calcification at the STJ that went around the right and non coronary sinuses.The ostia of the coronary arteries were in normal position and were not obstructed. The native valve leaflets were excised and the annulus was decalcified with rongeurs. Care was taken to remove all particulate debris. The left ventricle was directly inspected for debris and then irrigated with ice saline solution. The annulus was sized and a size 23 mm Edwards INSPIRIS RESILIA pericardial valve was chosen. The model number was [id number] and the serial number was [id number].  While the valve was being prepared 2-0 Ethibond pledgeted horizontal mattress sutures were placed around the  annulus with the pledgets in a sub-annular position. The sutures were placed through the sewing ring and the valve lowered into place. The sutures were tied sequentially. The valve seated nicely and the coronary ostia were not obstructed. The prosthetic valve leaflets moved normally and there was no sub-valvular obstruction. The aortotomy was closed using 4-0 Prolene suture in 2 layers with felt strips to reinforce the closure.  Completion:  The patient was rewarmed to 37 degrees Centigrade. The clamp was removed from the LIMA pedicle and there was rapid warming of the septum and return of ventricular fibrillation. The crossclamp was removed with a time of 140 minutes. There was spontaneous return of sinus rhythm. The distal and proximal anastomoses were checked for hemostasis. The position of the grafts was satisfactory. Two temporary epicardial pacing wires were placed on the right atrium and two on the right ventricle. The patient was weaned from CPB without difficulty on no inotropes. CPB time was 174 minutes. Cardiac output was 5 LPM. Heparin was fully  reversed with protamine and the aortic and venous cannulas removed. Hemostasis was achieved. Mediastinal and left pleural drainage tubes were placed. The sternum was closed with double #6 stainless steel wires. The fascia was closed with continuous # 1 vicryl suture. The subcutaneous tissue was closed with 2-0 vicryl continuous suture. The skin was closed with 3-0 vicryl subcuticular suture. All sponge, needle, and instrument counts were reported correct at the end of the case. Dry sterile dressings were placed over the incisions and around the chest tubes which were connected to pleurevac suction. The patient was then transported to the surgical intensive care unit in  stable condition.

## 2019-11-26 NOTE — Transfer of Care (Signed)
Immediate Anesthesia Transfer of Care Note  Patient: Jeffery West  Procedure(s) Performed: CORONARY ARTERY BYPASS GRAFTING (CABG) X Two , using left internal mammary artery and right leg greater saphenous vein harvested endoscopically. (N/A Chest) AORTIC VALVE REPLACEMENT (AVR) USING INSPIRIS AORTIC VALVE SIZE 23MM (N/A Chest) TRANSESOPHAGEAL ECHOCARDIOGRAM (TEE) (N/A )  Patient Location: SICU  Anesthesia Type:General  Level of Consciousness: Patient remains intubated per anesthesia plan  Airway & Oxygen Therapy: Patient remains intubated per anesthesia plan and Patient placed on Ventilator (see vital sign flow sheet for setting)  Post-op Assessment: Report given to RN and Post -op Vital signs reviewed and stable  Post vital signs: Reviewed and stable  Last Vitals:  Vitals Value Taken Time  BP    Temp    Pulse 80 11/26/19 1507  Resp 12 11/26/19 1507  SpO2 99 % 11/26/19 1507  Vitals shown include unvalidated device data. BP 98/50 Last Pain: There were no vitals filed for this visit.       Complications: No apparent anesthesia complications

## 2019-11-26 NOTE — Progress Notes (Signed)
Failed rapid wean protocol at this time due to apnea. Placed back on full support. RN will call when patient more alert and awake.

## 2019-11-26 NOTE — Anesthesia Postprocedure Evaluation (Signed)
Anesthesia Post Note  Patient: Jeffery West  Procedure(s) Performed: CORONARY ARTERY BYPASS GRAFTING (CABG) X Two , using left internal mammary artery and right leg greater saphenous vein harvested endoscopically. (N/A Chest) AORTIC VALVE REPLACEMENT (AVR) USING INSPIRIS AORTIC VALVE SIZE 23MM (N/A Chest) TRANSESOPHAGEAL ECHOCARDIOGRAM (TEE) (N/A )     Patient location during evaluation: SICU Anesthesia Type: General Level of consciousness: patient remains intubated per anesthesia plan and sedated Pain management: pain level controlled Vital Signs Assessment: post-procedure vital signs reviewed and stable Respiratory status: patient on ventilator - see flowsheet for VS and patient remains intubated per anesthesia plan Cardiovascular status: stable Postop Assessment: no apparent nausea or vomiting Anesthetic complications: no Comments: Pt doing well in ICU    Last Vitals:  Vitals:   11/26/19 1645 11/26/19 1700  BP:  (!) 94/58  Pulse: 80 80  Resp: 16 16  Temp: 36.6 C 36.7 C  SpO2: 100% 100%    Last Pain:  Vitals:   11/26/19 1600  TempSrc: Core                 Rose Hippler,E. Bishoy Cupp

## 2019-11-27 ENCOUNTER — Inpatient Hospital Stay (HOSPITAL_COMMUNITY): Payer: No Typology Code available for payment source

## 2019-11-27 ENCOUNTER — Encounter: Payer: Self-pay | Admitting: *Deleted

## 2019-11-27 LAB — CBC
HCT: 38.5 % — ABNORMAL LOW (ref 39.0–52.0)
HCT: 37.6 % — ABNORMAL LOW (ref 39.0–52.0)
Hemoglobin: 13.1 g/dL (ref 13.0–17.0)
Hemoglobin: 12.6 g/dL — ABNORMAL LOW (ref 13.0–17.0)
MCH: 31.2 pg (ref 26.0–34.0)
MCH: 31.4 pg (ref 26.0–34.0)
MCHC: 34 g/dL (ref 30.0–36.0)
MCHC: 33.5 g/dL (ref 30.0–36.0)
MCV: 91.7 fL (ref 80.0–100.0)
MCV: 93.8 fL (ref 80.0–100.0)
Platelets: 104 10*3/uL — ABNORMAL LOW (ref 150–400)
Platelets: 90 10*3/uL — ABNORMAL LOW (ref 150–400)
RBC: 4.2 MIL/uL — ABNORMAL LOW (ref 4.22–5.81)
RBC: 4.01 MIL/uL — ABNORMAL LOW (ref 4.22–5.81)
RDW: 12.8 % (ref 11.5–15.5)
RDW: 13 % (ref 11.5–15.5)
WBC: 11.4 10*3/uL — ABNORMAL HIGH (ref 4.0–10.5)
WBC: 11.8 10*3/uL — ABNORMAL HIGH (ref 4.0–10.5)
nRBC: 0 % (ref 0.0–0.2)
nRBC: 0 % (ref 0.0–0.2)

## 2019-11-27 LAB — GLUCOSE, CAPILLARY
Glucose-Capillary: 155 mg/dL — ABNORMAL HIGH (ref 70–99)
Glucose-Capillary: 156 mg/dL — ABNORMAL HIGH (ref 70–99)
Glucose-Capillary: 132 mg/dL — ABNORMAL HIGH (ref 70–99)
Glucose-Capillary: 160 mg/dL — ABNORMAL HIGH (ref 70–99)
Glucose-Capillary: 164 mg/dL — ABNORMAL HIGH (ref 70–99)
Glucose-Capillary: 176 mg/dL — ABNORMAL HIGH (ref 70–99)
Glucose-Capillary: 164 mg/dL — ABNORMAL HIGH (ref 70–99)
Glucose-Capillary: 199 mg/dL — ABNORMAL HIGH (ref 70–99)
Glucose-Capillary: 303 mg/dL — ABNORMAL HIGH (ref 70–99)
Glucose-Capillary: 283 mg/dL — ABNORMAL HIGH (ref 70–99)

## 2019-11-27 LAB — POCT I-STAT 7, (LYTES, BLD GAS, ICA,H+H)
Acid-base deficit: 1 mmol/L (ref 0.0–2.0)
Acid-base deficit: 1 mmol/L (ref 0.0–2.0)
Acid-base deficit: 3 mmol/L — ABNORMAL HIGH (ref 0.0–2.0)
Acid-base deficit: 4 mmol/L — ABNORMAL HIGH (ref 0.0–2.0)
Bicarbonate: 23.9 mmol/L (ref 20.0–28.0)
Bicarbonate: 24.1 mmol/L (ref 20.0–28.0)
Bicarbonate: 25.5 mmol/L (ref 20.0–28.0)
Bicarbonate: 26.2 mmol/L (ref 20.0–28.0)
Calcium, Ion: 1.17 mmol/L (ref 1.15–1.40)
Calcium, Ion: 1.17 mmol/L (ref 1.15–1.40)
Calcium, Ion: 1.2 mmol/L (ref 1.15–1.40)
Calcium, Ion: 1.21 mmol/L (ref 1.15–1.40)
HCT: 37 % — ABNORMAL LOW (ref 39.0–52.0)
HCT: 38 % — ABNORMAL LOW (ref 39.0–52.0)
HCT: 38 % — ABNORMAL LOW (ref 39.0–52.0)
HCT: 40 % (ref 39.0–52.0)
Hemoglobin: 12.6 g/dL — ABNORMAL LOW (ref 13.0–17.0)
Hemoglobin: 12.9 g/dL — ABNORMAL LOW (ref 13.0–17.0)
Hemoglobin: 12.9 g/dL — ABNORMAL LOW (ref 13.0–17.0)
Hemoglobin: 13.6 g/dL (ref 13.0–17.0)
O2 Saturation: 97 %
O2 Saturation: 98 %
O2 Saturation: 99 %
O2 Saturation: 99 %
Patient temperature: 36.4
Patient temperature: 36.9
Potassium: 4.1 mmol/L (ref 3.5–5.1)
Potassium: 4.3 mmol/L (ref 3.5–5.1)
Potassium: 4.4 mmol/L (ref 3.5–5.1)
Potassium: 5 mmol/L (ref 3.5–5.1)
Sodium: 140 mmol/L (ref 135–145)
Sodium: 140 mmol/L (ref 135–145)
Sodium: 141 mmol/L (ref 135–145)
Sodium: 141 mmol/L (ref 135–145)
TCO2: 26 mmol/L (ref 22–32)
TCO2: 26 mmol/L (ref 22–32)
TCO2: 27 mmol/L (ref 22–32)
TCO2: 28 mmol/L (ref 22–32)
pCO2 arterial: 49.7 mmHg — ABNORMAL HIGH (ref 32.0–48.0)
pCO2 arterial: 50.7 mmHg — ABNORMAL HIGH (ref 32.0–48.0)
pCO2 arterial: 54.8 mmHg — ABNORMAL HIGH (ref 32.0–48.0)
pCO2 arterial: 55.1 mmHg — ABNORMAL HIGH (ref 32.0–48.0)
pH, Arterial: 7.242 — ABNORMAL LOW (ref 7.350–7.450)
pH, Arterial: 7.289 — ABNORMAL LOW (ref 7.350–7.450)
pH, Arterial: 7.293 — ABNORMAL LOW (ref 7.350–7.450)
pH, Arterial: 7.309 — ABNORMAL LOW (ref 7.350–7.450)
pO2, Arterial: 100 mmHg (ref 83.0–108.0)
pO2, Arterial: 118 mmHg — ABNORMAL HIGH (ref 83.0–108.0)
pO2, Arterial: 158 mmHg — ABNORMAL HIGH (ref 83.0–108.0)
pO2, Arterial: 184 mmHg — ABNORMAL HIGH (ref 83.0–108.0)

## 2019-11-27 LAB — BASIC METABOLIC PANEL
Anion gap: 8 (ref 5–15)
Anion gap: 8 (ref 5–15)
BUN: 12 mg/dL (ref 8–23)
BUN: 14 mg/dL (ref 8–23)
CO2: 24 mmol/L (ref 22–32)
CO2: 25 mmol/L (ref 22–32)
Calcium: 7.8 mg/dL — ABNORMAL LOW (ref 8.9–10.3)
Calcium: 8 mg/dL — ABNORMAL LOW (ref 8.9–10.3)
Chloride: 106 mmol/L (ref 98–111)
Chloride: 100 mmol/L (ref 98–111)
Creatinine, Ser: 0.74 mg/dL (ref 0.61–1.24)
Creatinine, Ser: 0.96 mg/dL (ref 0.61–1.24)
GFR calc Af Amer: >60 mL/min (ref >60)
GFR calc Af Amer: >60 mL/min (ref >60)
GFR calc non Af Amer: >60 mL/min (ref >60)
GFR calc non Af Amer: >60 mL/min (ref >60)
Glucose, Bld: 152 mg/dL — ABNORMAL HIGH (ref 70–99)
Glucose, Bld: 315 mg/dL — ABNORMAL HIGH (ref 70–99)
Potassium: 4.6 mmol/L (ref 3.5–5.1)
Potassium: 4.4 mmol/L (ref 3.5–5.1)
Sodium: 138 mmol/L (ref 135–145)
Sodium: 133 mmol/L — ABNORMAL LOW (ref 135–145)

## 2019-11-27 LAB — SURGICAL PATHOLOGY

## 2019-11-27 LAB — MAGNESIUM
Magnesium: 2.2 mg/dL (ref 1.7–2.4)
Magnesium: 2.2 mg/dL (ref 1.7–2.4)

## 2019-11-27 MED ORDER — ENOXAPARIN SODIUM 40 MG/0.4ML ~~LOC~~ SOLN
40.0000 mg | Freq: Every day | SUBCUTANEOUS | Status: DC
  Administered 2019-11-27 – 2019-12-01 (×5): 40 mg via SUBCUTANEOUS
  Filled 2019-11-27 (×5): qty 0.4

## 2019-11-27 MED ORDER — ALLOPURINOL 300 MG PO TABS
300.0000 mg | ORAL_TABLET | Freq: Every day | ORAL | Status: DC
  Administered 2019-11-27 – 2019-12-02 (×6): 300 mg via ORAL
  Filled 2019-11-27 (×6): qty 1

## 2019-11-27 MED ORDER — INSULIN STARTER KIT- PEN NEEDLES (ENGLISH)
1.0000 | Freq: Once | Status: AC
  Administered 2019-11-27: 1
  Filled 2019-11-27: qty 1

## 2019-11-27 MED ORDER — INSULIN ASPART 100 UNIT/ML ~~LOC~~ SOLN: 0.0000 [IU] | SUBCUTANEOUS | Status: DC

## 2019-11-27 MED ORDER — INSULIN DETEMIR 100 UNIT/ML ~~LOC~~ SOLN
30.0000 [IU] | Freq: Every day | SUBCUTANEOUS | Status: DC
  Filled 2019-11-27: qty 0.3

## 2019-11-27 MED ORDER — TAMSULOSIN HCL 0.4 MG PO CAPS
0.4000 mg | ORAL_CAPSULE | Freq: Every day | ORAL | Status: DC
  Administered 2019-11-27 – 2019-12-01 (×5): 0.4 mg via ORAL
  Filled 2019-11-27 (×6): qty 1

## 2019-11-27 MED ORDER — FUROSEMIDE 10 MG/ML IJ SOLN
40.0000 mg | Freq: Two times a day (BID) | INTRAMUSCULAR | Status: AC
  Administered 2019-11-27 (×2): 40 mg via INTRAVENOUS
  Filled 2019-11-27 (×2): qty 4

## 2019-11-27 MED ORDER — LIVING WELL WITH DIABETES BOOK
Freq: Once | Status: AC
  Filled 2019-11-27: qty 1

## 2019-11-27 MED ORDER — INSULIN ASPART 100 UNIT/ML ~~LOC~~ SOLN
0.0000 [IU] | SUBCUTANEOUS | Status: DC
  Administered 2019-11-27: 16 [IU] via SUBCUTANEOUS
  Administered 2019-11-27: 8 [IU] via SUBCUTANEOUS
  Administered 2019-11-27: 12 [IU] via SUBCUTANEOUS
  Administered 2019-11-27: 4 [IU] via SUBCUTANEOUS
  Administered 2019-11-28 (×2): 8 [IU] via SUBCUTANEOUS

## 2019-11-27 MED ORDER — INSULIN DETEMIR 100 UNIT/ML ~~LOC~~ SOLN
30.0000 [IU] | Freq: Every day | SUBCUTANEOUS | Status: DC
  Administered 2019-11-27: 30 [IU] via SUBCUTANEOUS
  Filled 2019-11-27: qty 0.3

## 2019-11-27 MED ORDER — ROSUVASTATIN CALCIUM 5 MG PO TABS
10.0000 mg | ORAL_TABLET | Freq: Every day | ORAL | Status: DC
  Administered 2019-11-27 – 2019-12-02 (×6): 10 mg via ORAL
  Filled 2019-11-27 (×6): qty 2

## 2019-11-27 MED FILL — Thrombin (Recombinant) For Soln 20000 Unit: CUTANEOUS | Qty: 1 | Status: AC

## 2019-11-27 NOTE — Progress Notes (Signed)
Pt was extubated per rapid wean protocol. Pt is able to voice his name, pulls 1500 on incentive spirometer, and splints when coughing. Pt reports severe sternal pain at this time, PRN medication will be given, see MAR. Call bell is within reach and bed is in lowest position.

## 2019-11-27 NOTE — Progress Notes (Signed)
Antonieta Pert MD regarding pt's ABG results. MD was made aware that pt failed weaning the first time but is now more awake. MD stated it was okay to go ahead and extubate pt.

## 2019-11-27 NOTE — Progress Notes (Signed)
1 Day Post-Op Procedure(s) (LRB): CORONARY ARTERY BYPASS GRAFTING (CABG) X Two , using left internal mammary artery and right leg greater saphenous vein harvested endoscopically. (N/A) AORTIC VALVE REPLACEMENT (AVR) USING INSPIRIS AORTIC VALVE SIZE 23MM (N/A) TRANSESOPHAGEAL ECHOCARDIOGRAM (TEE) (N/A) Subjective: Sore but otherwise feels ok.  Objective: Vital signs in last 24 hours: Temp:  [97.5 F (36.4 C)-100 F (37.8 C)] 99.3 F (37.4 C) (06/08 0610) Pulse Rate:  [79-102] 102 (06/08 0610) Cardiac Rhythm: Atrial paced (06/08 0400) Resp:  [9-35] 21 (06/08 0610) BP: (87-130)/(38-81) 119/70 (06/08 0500) SpO2:  [95 %-100 %] 95 % (06/08 0610) Arterial Line BP: (94-205)/(43-177) 157/53 (06/08 0610) FiO2 (%):  [40 %-50 %] 40 % (06/08 0000) Weight:  [122.3 kg] 122.3 kg (06/08 0500)  Hemodynamic parameters for last 24 hours: PAP: (16-47)/(6-19) 29/11 CO:  [4.2 L/min-6.8 L/min] 6.8 L/min CI:  [1.8 L/min/m2-2.9 L/min/m2] 2.9 L/min/m2  Intake/Output from previous day: 06/07 0701 - 06/08 0700 In: 5360.5 [P.O.:180; I.V.:3366.2; Blood:670; IV Piggyback:1144.3] Out: 3299 [Urine:3000; Emesis/NG output:110; Blood:1005; Chest Tube:450] Intake/Output this shift: Total I/O In: 1029.6 [P.O.:180; I.V.:570.8; IV Piggyback:278.8] Out: 2426 [Urine:865; Emesis/NG output:110; Chest Tube:280]  General appearance: alert and cooperative Neurologic: intact Heart: regular rate and rhythm, S1, S2 normal, no murmur Lungs: clear to auscultation bilaterally Extremities: no edema Wound: dressings dry  Lab Results: Recent Labs    11/26/19 2102 11/27/19 0030 11/27/19 0206 11/27/19 0402  WBC 13.4*  --   --  11.4*  HGB 13.6   < > 12.6* 13.1  HCT 39.4   < > 37.0* 38.5*  PLT 119*  --   --  104*   < > = values in this interval not displayed.   BMET:  Recent Labs    11/26/19 2102 11/27/19 0030 11/27/19 0206 11/27/19 0402  NA 137   < > 140 138  K 4.4   < > 4.4 4.6  CL 107  --   --  106  CO2 24   --   --  24  GLUCOSE 137*  --   --  152*  BUN 13  --   --  12  CREATININE 0.67  --   --  0.74  CALCIUM 7.9*  --   --  7.8*   < > = values in this interval not displayed.    PT/INR:  Recent Labs    11/26/19 1548  LABPROT 14.8  INR 1.2   ABG    Component Value Date/Time   PHART 7.289 (L) 11/27/2019 0206   HCO3 26.2 11/27/2019 0206   TCO2 28 11/27/2019 0206   ACIDBASEDEF 1.0 11/27/2019 0206   O2SAT 97.0 11/27/2019 0206   CBG (last 3)  Recent Labs    11/27/19 0204 11/27/19 0358 11/27/19 0623  GLUCAP 155* 156* 132*   CXR: mild interstitial edema  ECG: sinus, incomplete RBBB, no acute changes  Assessment/Plan: S/P Procedure(s) (LRB): CORONARY ARTERY BYPASS GRAFTING (CABG) X Two , using left internal mammary artery and right leg greater saphenous vein harvested endoscopically. (N/A) AORTIC VALVE REPLACEMENT (AVR) USING INSPIRIS AORTIC VALVE SIZE 23MM (N/A) TRANSESOPHAGEAL ECHOCARDIOGRAM (TEE) (N/A)  POD 1 Hemodynamically stable in sinus rhythm. Continue low dose beta blocker and titrate as needed. Will resume losartan later.  Poorly controlled DM: preop Hgb A1c was 11.4. Will start Levemir and convert to [place]. Will need adjustment of oral meds and obviously needs a lot more attention to diet modification, exercise and weight loss.  DC chest tubes, swan and arterial line.  Diurese  OOB, ambulate, IS.   LOS: 1 day    Gaye Pollack 11/27/2019

## 2019-11-27 NOTE — Progress Notes (Signed)
CT surgery p.m. Rounds  Patient was up out of bed to chair Pulmonary status stable P.m. labs reviewed and are satisfactory  Blood pressure 136/79, pulse 84, temperature 97.9 F (36.6 C), temperature source Oral, resp. rate (!) 22, height 5\' 11"  (1.803 m), weight 122.3 kg, SpO2 98 %.

## 2019-11-27 NOTE — Addendum Note (Signed)
Addendum  created 11/27/19 0728 by Josephine Igo, CRNA   Order list changed

## 2019-11-27 NOTE — Plan of Care (Signed)
Pt is alert and oriented, on 4L [place], breathing is even and unlabored. Pt has been resting comfortably since extubation and is alert to voice. Pt is able to move all extremities, verbalizes the importance of breathing exercises, and mobility. Pt has received PRN medication for pain, see MAR. Pt is off neo at this time and remains hemodynamically stable, no signs of bleeding. Call bell is within reach and bed is in lowest position. Problem: Education: Goal: Knowledge of General Education information will improve Description: Including pain rating scale, medication(s)/side effects and non-pharmacologic comfort measures Outcome: Progressing   Problem: Clinical Measurements: Goal: Ability to maintain clinical measurements within normal limits will improve Outcome: Progressing Goal: Respiratory complications will improve Outcome: Progressing Goal: Cardiovascular complication will be avoided Outcome: Progressing   Problem: Coping: Goal: Level of anxiety will decrease Outcome: Progressing

## 2019-11-27 NOTE — Progress Notes (Signed)
Inpatient Diabetes Program Recommendations  AACE/ADA: New Consensus Statement on Inpatient Glycemic Control (2015)  Target Ranges:  Prepandial:   less than 140 mg/dL      Peak postprandial:   less than 180 mg/dL (1-2 hours)      Critically ill patients:  140 - 180 mg/dL   Lab Results  Component Value Date   GLUCAP 164 (H) 11/27/2019   HGBA1C 11.4 (H) 11/22/2019    Review of Glycemic Control Results for Jeffery West, Jeffery West (MRN [id number]) as of 11/27/2019 14:06  Ref. Range 11/27/2019 06:23 11/27/2019 07:58 11/27/2019 08:28 11/27/2019 09:46 11/27/2019 11:12  Glucose-Capillary Latest Ref Range: 70 - 99 mg/dL 132 (H) 160 (H) 164 (H) 176 (H) 164 (H)   Diabetes history: DM2 Outpatient Diabetes medications: Metformin 500 BID, Tresiba 30 units QHS Current orders for Inpatient glycemic control: Levemir 30 units qd + Novolog 0-24 q 4 hrs.  Inpatient Diabetes Program Recommendations:   Spoke with patient today to discuss diabetes management but patient requests to wait until tomorrow for discussion. States he had a "rough night". Will plan to speak with patient tomorrow. Ordered Living Well With Diabetes book and starter kit to prepare if patient is discharged on insulin.  Thank you, Nani Gasser. Hephzibah Strehle, RN, MSN, CDE  Diabetes Coordinator Inpatient Glycemic Control Team Team Pager [phone] (8am-5pm) 11/27/2019 2:10 PM

## 2019-11-27 NOTE — Procedures (Signed)
Extubation Procedure Note  Patient Details:   Name: Jeffery West DOB: 08-27-1955 MRN: [id number]   Airway Documentation:  Airway 8 mm (Active)  Secured at (cm) 24 cm 11/27/19 0000  Measured From Lips 11/27/19 0000  Secured Location Right 11/26/19 2330  Secured By Pink Tape 11/27/19 0000  Cuff Pressure (cm H2O) 28 cm H2O 11/26/19 1953  Site Condition Dry 11/27/19 0000   Vent end date: 11/27/19 Vent end time: 0104   Evaluation  O2 sats: stable throughout Complications: No apparent complications Patient did tolerate procedure well. Bilateral Breath Sounds: Clear, Diminished   Patient extubated to 4L [place]. NIF -30  VC 1.1L  Positive for cuff leak. Patient able to vocalize post extubation.   Lamona Curl [facility] 11/27/2019, 1:26 AM

## 2019-11-28 ENCOUNTER — Inpatient Hospital Stay (HOSPITAL_COMMUNITY): Payer: No Typology Code available for payment source

## 2019-11-28 LAB — CBC
HCT: 37.4 % — ABNORMAL LOW (ref 39.0–52.0)
Hemoglobin: 12.2 g/dL — ABNORMAL LOW (ref 13.0–17.0)
MCH: 30.8 pg (ref 26.0–34.0)
MCHC: 32.6 g/dL (ref 30.0–36.0)
MCV: 94.4 fL (ref 80.0–100.0)
Platelets: 96 10*3/uL — ABNORMAL LOW (ref 150–400)
RBC: 3.96 MIL/uL — ABNORMAL LOW (ref 4.22–5.81)
RDW: 13 % (ref 11.5–15.5)
WBC: 12.7 10*3/uL — ABNORMAL HIGH (ref 4.0–10.5)
nRBC: 0 % (ref 0.0–0.2)

## 2019-11-28 LAB — BASIC METABOLIC PANEL
Anion gap: 7 (ref 5–15)
BUN: 15 mg/dL (ref 8–23)
CO2: 28 mmol/L (ref 22–32)
Calcium: 8 mg/dL — ABNORMAL LOW (ref 8.9–10.3)
Chloride: 101 mmol/L (ref 98–111)
Creatinine, Ser: 0.87 mg/dL (ref 0.61–1.24)
GFR calc Af Amer: >60 mL/min (ref >60)
GFR calc non Af Amer: >60 mL/min (ref >60)
Glucose, Bld: 218 mg/dL — ABNORMAL HIGH (ref 70–99)
Potassium: 4 mmol/L (ref 3.5–5.1)
Sodium: 136 mmol/L (ref 135–145)

## 2019-11-28 LAB — GLUCOSE, CAPILLARY
Glucose-Capillary: 225 mg/dL — ABNORMAL HIGH (ref 70–99)
Glucose-Capillary: 239 mg/dL — ABNORMAL HIGH (ref 70–99)
Glucose-Capillary: 203 mg/dL — ABNORMAL HIGH (ref 70–99)
Glucose-Capillary: 266 mg/dL — ABNORMAL HIGH (ref 70–99)
Glucose-Capillary: 286 mg/dL — ABNORMAL HIGH (ref 70–99)
Glucose-Capillary: 167 mg/dL — ABNORMAL HIGH (ref 70–99)

## 2019-11-28 MED ORDER — FUROSEMIDE 40 MG PO TABS
40.0000 mg | ORAL_TABLET | Freq: Every day | ORAL | Status: DC
  Administered 2019-11-29 – 2019-12-02 (×4): 40 mg via ORAL
  Filled 2019-11-28 (×4): qty 1

## 2019-11-28 MED ORDER — SODIUM CHLORIDE 0.9% FLUSH
3.0000 mL | Freq: Two times a day (BID) | INTRAVENOUS | Status: DC
  Administered 2019-11-28 – 2019-12-01 (×6): 3 mL via INTRAVENOUS

## 2019-11-28 MED ORDER — SODIUM CHLORIDE 0.9% FLUSH: 3.0000 mL | INTRAVENOUS | Status: DC | PRN

## 2019-11-28 MED ORDER — ~~LOC~~ CARDIAC SURGERY, PATIENT & FAMILY EDUCATION: Freq: Once | Status: AC

## 2019-11-28 MED ORDER — PANTOPRAZOLE SODIUM 40 MG PO TBEC
40.0000 mg | DELAYED_RELEASE_TABLET | Freq: Every day | ORAL | Status: DC
  Administered 2019-11-29 – 2019-12-01 (×3): 40 mg via ORAL
  Filled 2019-11-28 (×4): qty 1

## 2019-11-28 MED ORDER — TRAMADOL HCL 50 MG PO TABS
50.0000 mg | ORAL_TABLET | ORAL | Status: DC | PRN
  Administered 2019-12-02: 50 mg via ORAL
  Filled 2019-11-28: qty 1

## 2019-11-28 MED ORDER — INSULIN DETEMIR 100 UNIT/ML ~~LOC~~ SOLN
40.0000 [IU] | Freq: Every day | SUBCUTANEOUS | Status: DC
  Administered 2019-11-28 – 2019-11-29 (×2): 40 [IU] via SUBCUTANEOUS
  Filled 2019-11-28 (×3): qty 0.4

## 2019-11-28 MED ORDER — POTASSIUM CHLORIDE CRYS ER 20 MEQ PO TBCR
20.0000 meq | EXTENDED_RELEASE_TABLET | Freq: Two times a day (BID) | ORAL | Status: DC
  Administered 2019-11-28 – 2019-12-02 (×9): 20 meq via ORAL
  Filled 2019-11-28 (×8): qty 1

## 2019-11-28 MED ORDER — ONDANSETRON HCL 4 MG/2ML IJ SOLN: 4.0000 mg | Freq: Four times a day (QID) | INTRAMUSCULAR | Status: DC | PRN

## 2019-11-28 MED ORDER — BISACODYL 5 MG PO TBEC: 10.0000 mg | DELAYED_RELEASE_TABLET | Freq: Every day | ORAL | Status: DC | PRN

## 2019-11-28 MED ORDER — OXYCODONE HCL 5 MG PO TABS
5.0000 mg | ORAL_TABLET | ORAL | Status: DC | PRN
  Administered 2019-11-28: 5 mg via ORAL
  Filled 2019-11-28: qty 1

## 2019-11-28 MED ORDER — ONDANSETRON HCL 4 MG PO TABS: 4.0000 mg | ORAL_TABLET | Freq: Four times a day (QID) | ORAL | Status: DC | PRN

## 2019-11-28 MED ORDER — ORAL CARE MOUTH RINSE
15.0000 mL | Freq: Two times a day (BID) | OROMUCOSAL | Status: DC
  Administered 2019-11-28 – 2019-12-01 (×7): 15 mL via OROMUCOSAL

## 2019-11-28 MED ORDER — SODIUM CHLORIDE 0.9 % IV SOLN: 250.0000 mL | INTRAVENOUS | Status: DC | PRN

## 2019-11-28 MED ORDER — DOCUSATE SODIUM 100 MG PO CAPS
200.0000 mg | ORAL_CAPSULE | Freq: Every day | ORAL | Status: DC
  Administered 2019-11-29 – 2019-12-01 (×3): 200 mg via ORAL
  Filled 2019-11-28 (×4): qty 2

## 2019-11-28 MED ORDER — METOPROLOL TARTRATE 25 MG PO TABS
25.0000 mg | ORAL_TABLET | Freq: Two times a day (BID) | ORAL | Status: DC
  Administered 2019-11-28 – 2019-12-02 (×8): 25 mg via ORAL
  Filled 2019-11-28 (×8): qty 1

## 2019-11-28 MED ORDER — BISACODYL 10 MG RE SUPP: 10.0000 mg | Freq: Every day | RECTAL | Status: DC | PRN

## 2019-11-28 MED ORDER — ASPIRIN EC 325 MG PO TBEC
325.0000 mg | DELAYED_RELEASE_TABLET | Freq: Every day | ORAL | Status: DC
  Administered 2019-11-29 – 2019-12-02 (×4): 325 mg via ORAL
  Filled 2019-11-28 (×4): qty 1

## 2019-11-28 MED ORDER — ACETAMINOPHEN 325 MG PO TABS
650.0000 mg | ORAL_TABLET | Freq: Four times a day (QID) | ORAL | Status: DC | PRN
  Administered 2019-11-29 – 2019-12-02 (×8): 650 mg via ORAL
  Filled 2019-11-28 (×8): qty 2

## 2019-11-28 MED ORDER — INSULIN ASPART 100 UNIT/ML ~~LOC~~ SOLN
0.0000 [IU] | Freq: Three times a day (TID) | SUBCUTANEOUS | Status: DC
  Administered 2019-11-28: 4 [IU] via SUBCUTANEOUS
  Administered 2019-11-28 (×2): 12 [IU] via SUBCUTANEOUS
  Administered 2019-11-29: 8 [IU] via SUBCUTANEOUS
  Administered 2019-11-29: 12 [IU] via SUBCUTANEOUS
  Administered 2019-11-29 (×2): 8 [IU] via SUBCUTANEOUS
  Administered 2019-11-30 (×2): 4 [IU] via SUBCUTANEOUS
  Administered 2019-11-30: 8 [IU] via SUBCUTANEOUS
  Administered 2019-11-30 – 2019-12-01 (×2): 4 [IU] via SUBCUTANEOUS
  Administered 2019-12-01 – 2019-12-02 (×4): 2 [IU] via SUBCUTANEOUS

## 2019-11-28 MED ORDER — INSULIN ASPART 100 UNIT/ML ~~LOC~~ SOLN
6.0000 [IU] | Freq: Three times a day (TID) | SUBCUTANEOUS | Status: DC
  Administered 2019-11-28 – 2019-12-02 (×12): 6 [IU] via SUBCUTANEOUS

## 2019-11-28 NOTE — Progress Notes (Signed)
CARDIAC REHAB PHASE I   PRE:  Rate/Rhythm: 85 SR    SaO2: 965 2L  MODE:  Ambulation: 160 ft   POST:  Rate/Rhythm: 95 SR  BP:  Supine: 135/61  Sitting:   Standing:    SaO2: 94% 2L 1435-1525 Pt needed to go to bathroom and then we walked 160 ft on 2L with rolling walker and gait belt use. Pt encouraged to rock to stand and adhere to sternal precautions. Stopped several times to rest during walk. Encouraged pursed lip breathing. To bed after walk. Sats good on 2L. Encouraged pt to use IS 10x an hour which he has not been doing to assist with pulmonary toilet.  Wife in room and very supportive of mobility and IS.   Graylon Good, RN BSN  11/28/2019 3:20 PM

## 2019-11-28 NOTE — Progress Notes (Signed)
Patient to room 4E08 from [facility]. Vital signs obtained. Monitor on CCMD notified. Alert and oriented to room and call light. Call bell within reach.  Era Bumpers, RN

## 2019-11-28 NOTE — Discharge Summary (Signed)
[address]       [place] [address]             [phone]      Physician Discharge Summary  Patient ID: Jeffery West MRN: [id number] DOB/AGE: 1956-06-05 64 y.o.  Admit date: 11/26/2019 Discharge date: 12/02/2019  Admission Diagnoses:  Patient Active Problem List   Diagnosis Date Noted  . Unstable angina ([place]) 10/19/2019  . Encounter for screening colonoscopy   . Polyp of ascending colon   . Aortic valve stenosis 07/11/2017  . HTN (hypertension) 07/09/2017  . Smoker 07/09/2017  . Murmur 07/09/2017  . AB (asthmatic bronchitis) 02/26/2015  . Adaptation reaction 02/26/2015  . Allergic rhinitis 02/26/2015  . Poorly controlled type 2 diabetes mellitus ([place]) 02/26/2015  . ED (erectile dysfunction) of organic origin 02/26/2015  . Gout 02/26/2015  . HLD (hyperlipidemia) 02/26/2015  . Morbid obesity ([place]) 02/26/2015  . Arthritis, degenerative 02/26/2015  . Avitaminosis D 02/26/2015  . Airway hyperreactivity 02/26/2015  . Controlled type 2 diabetes mellitus without complication ([place]) 46/96/2952  . Erectile dysfunction associated with type 2 diabetes mellitus ([place]) 01/07/2015    Discharge Diagnoses:  Active Problems:   S/P AVR   Discharged Condition: good  HPI:   The patient is a 64 year old gentleman with a history of obesity, hypertension, hyperlipidemia, poorly controlled diabetes, remote cigar smoking, and aortic stenosis that has been followed by periodic echocardiograms. He had a 2D echocardiogram at [facility] in June 2017 that showed a mean aortic valve gradient of 25.5 mmHg. His most recent echocardiogram on 09/28/2019 showed an increase in the aortic valve mean gradient to 53.5 mmHg. Left ventricular ejection fraction was 60 to 65% with grade 1 diastolic dysfunction. Cardiac catheterization on 10/19/2019 showed a 80% stenosis in the mid LAD. There was 70% first diagonal stenosis.   The patient is married and lives with his wife. They both report a  history of exertional fatigue and shortness of breath which has progressed over the past 6 months or so. He denies any chest discomfort. He has had no dizziness or syncope. He denies orthopnea and peripheral edema.   Hospital Course:   On 11/26/2019 Jeffery West underwent a CABG x 2 and an AVR with Dr. Cyndia Bent. He tolerated the procedure well and was transferred to the surgical ICU for continued care. POD 1 he had some incisional soreness but was otherwise doing okay. We discontinued his chest tubes, swan-ganz catheter and arterial line. We started him on a beta blocker and he was weaned off all pressor support. We initiated a diuretic regimen for fluid overload. POD 2 he was ambulating with limited difficulty. He remained hemodynamically stable. His blood glucose was well controlled. We continued to encourage use of his incentive spirometer. It was determined that it was safe to transfer him to the step down unit for continued care. On the floor, he remained fluid overloaded therefore we continued diuresis. His blood glucose remained elevated therefore we started metformin at 1,000mg  BID and increased his Levemir to 55 daily as well as meal coverage and SSI. He will need close follow-up with an endocrinologist outpatient for medication adjustment.  He has a primary care physician who manages his diabetes at this time.  Incisions are noted to be healing well without evidence of infection.  Oxygen has been weaned and he maintains good saturations on room air.  He is tolerating gradually increasing activities using standard protocols.  He has a mild expected acute blood  loss anemia.  Renal function has been within normal limits.  He has been restarted on his ARB as his blood pressure did show some increase in elevation over time.  It is improving.  At the time of discharge the patient is felt to be quite stable.  Consults: diabetic coordinator  Significant Diagnostic Studies:   ECHOCARDIOGRAM REPORT        Patient Name:  Jeffery West Date of Exam: 09/28/2019  Medical Rec #: [id number]   Height:    71.5 in  Accession #:  [id number]  Weight:    270.0 lb  Date of Birth: 01-Apr-1956   BSA:     2.408 m  Patient Age:  64 years   BP:      130/72 mmHg  Patient Gender: M       HR:      88 bpm.  Exam Location: [place]   Procedure: 2D Echo, Cardiac Doppler, Color Doppler and Intracardiac       Opacification Agent   Indications:  I35.0 Nonrheumatic aortic (valve) stenosis    History:    Patient has prior history of Echocardiogram examinations,  most         recent 11/26/2015. Aortic Valve Disease,  Signs/Symptoms:Shortness         of Breath and Murmur; Risk Factors:Hypertension, Diabetes,         Dyslipidemia and Former Smoker. Patient c/o of mild SOB.    Sonographer:  Pilar Jarvis RDMS, RVT, RDCS  Referring Phys: [place]    1. Left ventricular ejection fraction, by estimation, is 60 to 65%. The  left ventricle has normal function. The left ventricle has no regional  wall motion abnormalities. Left ventricular diastolic parameters are  consistent with Grade I diastolic  dysfunction (impaired relaxation).  2. Right ventricular systolic function is normal. The right ventricular  size is normal.  3. Left atrial size was mildly dilated.  4. The aortic valve is severely calcified. Severe aortic valve stenosis.  Aortic valve area, by VTI measures 1.10 cm. Aortic valve mean gradient  measures 53.5 mmHg. Aortic valve Vmax measures 4.93 m/s.   FINDINGS  Left Ventricle: Left ventricular ejection fraction, by estimation, is 60  to 65%. The left ventricle has normal function. The left ventricle has no  regional wall motion abnormalities. Definity contrast agent was given IV  to delineate the left ventricular  endocardial borders. The left ventricular internal cavity size was  normal  in size. There is no left ventricular hypertrophy. Left ventricular  diastolic parameters are consistent with Grade I diastolic dysfunction  (impaired relaxation).   Right Ventricle: The right ventricular size is normal. No increase in  right ventricular wall thickness. Right ventricular systolic function is  normal.   Left Atrium: Left atrial size was mildly dilated.   Right Atrium: Right atrial size was normal in size.   Pericardium: There is no evidence of pericardial effusion.   Mitral Valve: The mitral valve is normal in structure. Normal mobility of  the mitral valve leaflets. Mild mitral valve regurgitation. No evidence of  mitral valve stenosis.   Tricuspid Valve: The tricuspid valve is normal in structure. Tricuspid  valve regurgitation is not demonstrated. No evidence of tricuspid  stenosis.   Aortic Valve: The aortic valve is normal in structure. Aortic valve  regurgitation is not visualized. Severe aortic stenosis is present. Aortic  valve mean gradient measures 53.5 mmHg. Aortic valve peak gradient  measures 97.2 mmHg. Aortic  valve area, by  VTI measures 1.10 cm.   Pulmonic Valve: The pulmonic valve was normal in structure. Pulmonic valve  regurgitation is not visualized. No evidence of pulmonic stenosis.   Aorta: The aortic root is normal in size and structure.   Venous: The inferior vena cava is normal in size with greater than 50%  respiratory variability, suggesting right atrial pressure of 3 mmHg.   IAS/Shunts: No atrial level shunt detected by color flow Doppler.     LEFT VENTRICLE  PLAX 2D  LVIDd:     4.40 cm Diastology  LVIDs:     2.50 cm LV e' lateral:  6.09 cm/s  LV PW:     1.30 cm LV E/e' lateral: 14.4  LV IVS:    1.20 cm LV e' medial:  6.09 cm/s  LVOT diam:   2.10 cm LV E/e' medial: 14.4  LV SV:     104  LV SV Index:  43  LVOT Area:   3.46 cm     RIGHT VENTRICLE       IVC  RV Basal diam:  4.60 cm   IVC diam: 2.30 cm  RV S prime:   16.40 cm/s  TAPSE (M-mode): 3.3 cm   LEFT ATRIUM       Index  LA diam:    4.10 cm 1.70 cm/m  LA Vol (A2C):  53.7 ml 22.30 ml/m  LA Vol (A4C):  47.9 ml 19.89 ml/m  LA Biplane Vol: 55.9 ml 23.22 ml/m  AORTIC VALVE          PULMONIC VALVE  AV Area (Vmax):  0.91 cm   PV Vmax:    1.26 m/s  AV Area (Vmean):  1.00 cm   PV Peak grad: 6.4 mmHg  AV Area (VTI):   1.10 cm  AV Vmax:      493.00 cm/s  AV Vmean:     345.500 cm/s  AV VTI:      0.944 m  AV Peak Grad:   97.2 mmHg  AV Mean Grad:   53.5 mmHg  LVOT Vmax:     130.00 cm/s  LVOT Vmean:    99.800 cm/s  LVOT VTI:     0.300 m  LVOT/AV VTI ratio: 0.32    AORTA  Ao Asc diam: 3.30 cm  Ao Arch diam: 3.1 cm   MITRAL VALVE  MV Area (PHT): 1.89 cm   SHUNTS  MV Decel Time: 402 msec   Systemic VTI: 0.30 m  MV E velocity: 87.40 cm/s  Systemic Diam: 2.10 cm  MV A velocity: 104.00 cm/s  MV E/A ratio: 0.84   [name] MD  Electronically signed by [name] MD  Signature Date/Time: 09/28/2019/7:49:43 PM      Final    Treatments:   CARDIOVASCULAR SURGERY OPERATIVE NOTE  11/26/2019  Surgeon:  Gaye Pollack, MD  First Assistant: Nicholes Rough, PA-C   Preoperative Diagnosis:  Severe single-vessel coronary artery disease and severe aortic stenosis   Postoperative Diagnosis:  Same   Procedure:  1. Median Sternotomy 2. Extracorporeal circulation 3.   Coronary artery bypass grafting x 2   Left internal mammary artery graft to the LAD  SVG to first diagonal   4.   Endoscopic vein harvest from the right leg  5.   Aortic valve replacement using a 23 mm Edwards INSPIRIS RESILIA pericardial valve   Anesthesia:  General Endotracheal  Discharge Exam: Blood pressure 134/69, pulse 79, temperature 98 F (36.7 C), temperature source Oral,  resp. rate 20, height 5\' 11"   (1.803 m), weight 118.6 kg, SpO2 96 %.    General appearance: alert, cooperative and no distress Heart: regular rate and rhythm Lungs: clear to auscultation bilaterally Abdomen: benign Extremities: min edema Wound: incis healing well Disposition: Discharge disposition: 01-Home or Self Care       Discharge Instructions    Amb Referral to Cardiac Rehabilitation   Complete by: As directed    Diagnosis:  CABG Valve Replacement     Valve: Aortic   CABG X ___: 2   After initial evaluation and assessments completed: Virtual Based Care may be provided alone or in conjunction with Phase 2 Cardiac Rehab based on patient barriers.: Yes   Discharge patient   Complete by: As directed    Discharge disposition: 01-Home or Self Care   Discharge patient date: 12/02/2019     Allergies as of 12/02/2019      Reactions   Penicillins Hives, Swelling, Other (See Comments)   Has patient had a PCN reaction causing immediate rash, facial/tongue/throat swelling, SOB or lightheadedness with hypotension: Unknown Has patient had a PCN reaction causing severe rash involving mucus membranes or skin necrosis: No Has patient had a PCN reaction that required hospitalization: no Has patient had a PCN reaction occurring within the last 10 years: No If all of the above answers are "NO", then may proceed with Cephalosporin use.      Medication List    STOP taking these medications   meloxicam 15 MG tablet Commonly known as: MOBIC   sildenafil 20 MG tablet Commonly known as: REVATIO   Tresiba FlexTouch 100 UNIT/ML FlexTouch Pen Generic drug: insulin degludec     TAKE these medications   acetaminophen 500 MG tablet Commonly known as: TYLENOL Take 1,000 mg by mouth every 6 (six) hours as needed for moderate pain or headache.   albuterol 108 (90 Base) MCG/ACT inhaler Commonly known as: ProAir HFA Inhale 1 puff into the lungs every 6 (six) hours as needed for wheezing or shortness of breath.    allopurinol 300 MG tablet Commonly known as: ZYLOPRIM Take 1 tablet (300 mg total) by mouth daily.   aspirin 325 MG EC tablet Take 1 tablet (325 mg total) by mouth daily. What changed:   medication strength  how much to take   BD Pen Needle Nano 2nd Gen 32G X 4 MM Misc Generic drug: Insulin Pen Needle USE AS DIRECTED   ezetimibe 10 MG tablet Commonly known as: ZETIA TAKE 1 TABLET BY MOUTH EVERY DAY   furosemide 40 MG tablet Commonly known as: LASIX Take 1 tablet (40 mg total) by mouth daily.   glucose blood test strip Commonly known as: OneTouch Verio Use as instructed; BID   insulin aspart 100 UNIT/ML injection Commonly known as: novoLOG Inject 6 Units into the skin 3 (three) times daily with meals.   insulin detemir 100 UNIT/ML injection Commonly known as: LEVEMIR Inject 0.55 mLs (55 Units total) into the skin daily.   losartan 50 MG tablet Commonly known as: COZAAR TAKE 1 TABLET BY MOUTH EVERY DAY   magnesium oxide 400 MG tablet Commonly known as: MAG-OX TAKE 1 TABLET BY MOUTH TWICE A DAY   metFORMIN 1000 MG tablet Commonly known as: GLUCOPHAGE Take 1 tablet (1,000 mg total) by mouth 2 (two) times daily with a meal. What changed:   medication strength  how much to take   metoprolol tartrate 25 MG tablet Commonly known as: LOPRESSOR Take 1 tablet (25  mg total) by mouth 2 (two) times daily.   MOISTURIZING LUBRICANT EYE OP Place 1 drop into both eyes daily as needed (for dry eyes).   ONE TOUCH LANCETS Misc 1 Device by Does not apply route 2 (two) times daily.   potassium chloride SA 20 MEQ tablet Commonly known as: KLOR-CON Take 1 tablet (20 mEq total) by mouth daily.   rosuvastatin 5 MG tablet Commonly known as: CRESTOR Take 1 tablet (5 mg total) by mouth daily.   tamsulosin 0.4 MG Caps capsule Commonly known as: FLOMAX TAKE 1 CAPSULE BY MOUTH EVERY DAY   traMADol 50 MG tablet Commonly known as: ULTRAM Take 1 tablet (50 mg total) by  mouth every 6 (six) hours as needed for up to 7 days for moderate pain.   Vitamin D 50 MCG (2000 UT) Caps Take 2,000 Units by mouth every evening.            Durable Medical Equipment  (From admission, onward)         Start     Ordered   12/01/19 1128  For home use only DME Walker rolling  Once       Question Answer Comment  Walker: With 5 Inch Wheels   Patient needs a walker to treat with the following condition Weakness      12/01/19 1127          Follow-up Information    Jerrol Banana., MD. Call in 1 day(s).   Specialty: Family Medicine Contact information: [address] [place] [address] [phone]        Gaye Pollack, MD Follow up.   Specialty: Cardiothoracic Surgery Why: Your routine follow-up appointment is on  12/26/2019 @ 11 am. Please arrive at 10:30am for a chest xray located at [facility] which is on the first floor of our building.  Contact information: [place] [address] [phone]        Nursing suture Follow up.   Why: Your nursing suture removal appointment is on 6/21 @ 11:00am.  Contact information: Dr. Vivi Martens office        Loel Dubonnet, NP Follow up.   Specialty: Cardiology Why: Laurann Montana, NP 6/25 @ 9am [facility])  Contact information: [place] [address] [phone]              The patient has been discharged on:   1.Beta Blocker:  Yes [ yes  ]                              No   [   ]                              If No, reason:  2.Ace Inhibitor/ARB: Yes [  y ]                                     No  [  ]                                     If No, reason: BP is normotensive.   3.Statin:   Yes [ yes  ]  No  [   ]                  If No, reason:  4.Ecasa:  Yes  [ yes  ]                  No   [   ]                  If No, reason:    Signed: Wilder Glade Meggin Ola PA-C 12/02/2019, 8:21  AM

## 2019-11-28 NOTE — Progress Notes (Signed)
Mobility Specialist: Progress Note    11/28/19 1802  Mobility  Activity Ambulated in hall  Level of Assistance Modified independent, requires aide device or extra time  Assistive Device Front wheel walker  Distance Ambulated (ft) 110 ft  Mobility Response Tolerated well  Mobility performed by Mobility specialist  $Mobility charge 1 Mobility   Post-Mobility: 100 HR, 127/64 BP, 93% SpO2  Pt c/o dizziness during ambulation, resolved after returning to room.   [facility] Renel Ende Mobility Specialist

## 2019-11-28 NOTE — Progress Notes (Signed)
2 Days Post-Op Procedure(s) (LRB): CORONARY ARTERY BYPASS GRAFTING (CABG) X Two , using left internal mammary artery and right leg greater saphenous vein harvested endoscopically. (N/A) AORTIC VALVE REPLACEMENT (AVR) USING INSPIRIS AORTIC VALVE SIZE 23MM (N/A) TRANSESOPHAGEAL ECHOCARDIOGRAM (TEE) (N/A) Subjective: Sore but otherwise feels ok. Ambulated this am.  Objective: Vital signs in last 24 hours: Temp:  [97.9 F (36.6 C)-99.5 F (37.5 C)] 98.5 F (36.9 C) (06/09 0351) Pulse Rate:  [74-98] 79 (06/09 0500) Cardiac Rhythm: Normal sinus rhythm (06/09 0357) Resp:  [13-22] 15 (06/09 0500) BP: (99-136)/(54-79) 104/60 (06/09 0500) SpO2:  [94 %-99 %] 96 % (06/09 0500) Arterial Line BP: (117-172)/(44-56) 165/55 (06/08 1300) Weight:  [120.6 kg] 120.6 kg (06/09 0542)  Hemodynamic parameters for last 24 hours: PAP: (18-24)/(7-13) 20/9  Intake/Output from previous day: 06/08 0701 - 06/09 0700 In: 1525.6 [P.O.:1160; I.V.:215.6; IV Piggyback:150] Out: 2465 [Urine:2425; Chest Tube:40] Intake/Output this shift: No intake/output data recorded.  General appearance: alert and cooperative Neurologic: intact Heart: regular rate and rhythm Lungs: clear to auscultation bilaterally Extremities: mild edema Wound: incision ok  Lab Results: Recent Labs    11/27/19 1630 11/28/19 0513  WBC 11.8* 12.7*  HGB 12.6* 12.2*  HCT 37.6* 37.4*  PLT 90* 96*   BMET:  Recent Labs    11/27/19 1630 11/28/19 0513  NA 133* 136  K 4.4 4.0  CL 100 101  CO2 25 28  GLUCOSE 315* 218*  BUN 14 15  CREATININE 0.96 0.87  CALCIUM 8.0* 8.0*    PT/INR:  Recent Labs    11/26/19 1548  LABPROT 14.8  INR 1.2   ABG    Component Value Date/Time   PHART 7.289 (L) 11/27/2019 0206   HCO3 26.2 11/27/2019 0206   TCO2 28 11/27/2019 0206   ACIDBASEDEF 1.0 11/27/2019 0206   O2SAT 97.0 11/27/2019 0206   CBG (last 3)  Recent Labs    11/27/19 2335 11/28/19 0348 11/28/19 0653  GLUCAP 239* 225* 203*    CXR: clear  Assessment/Plan: S/P Procedure(s) (LRB): CORONARY ARTERY BYPASS GRAFTING (CABG) X Two , using left internal mammary artery and right leg greater saphenous vein harvested endoscopically. (N/A) AORTIC VALVE REPLACEMENT (AVR) USING INSPIRIS AORTIC VALVE SIZE 23MM (N/A) TRANSESOPHAGEAL ECHOCARDIOGRAM (TEE) (N/A)  POD 2 Hemodynamically stable in sinus rhythm. Continue Lopressor. Roll and tape pacer wires.  Poorly controlled DM: glucose overnight still 200's. Will add novolog meal coverage to SSI and Levemir. Increase Levemir to 40 units.  Volume excess: continue diuresis to preop wt.  IS, ambulation  Transfer to 4E.     LOS: 2 days    Gaye Pollack 11/28/2019

## 2019-11-29 ENCOUNTER — Encounter (HOSPITAL_COMMUNITY): Payer: Self-pay | Admitting: Surgery

## 2019-11-29 LAB — GLUCOSE, CAPILLARY
Glucose-Capillary: 208 mg/dL — ABNORMAL HIGH (ref 70–99)
Glucose-Capillary: 220 mg/dL — ABNORMAL HIGH (ref 70–99)
Glucose-Capillary: 269 mg/dL — ABNORMAL HIGH (ref 70–99)
Glucose-Capillary: 241 mg/dL — ABNORMAL HIGH (ref 70–99)
Glucose-Capillary: 210 mg/dL — ABNORMAL HIGH (ref 70–99)

## 2019-11-29 MED ORDER — METFORMIN HCL 500 MG PO TABS
1000.0000 mg | ORAL_TABLET | Freq: Two times a day (BID) | ORAL | Status: DC
  Administered 2019-11-29 – 2019-12-02 (×7): 1000 mg via ORAL
  Filled 2019-11-29 (×7): qty 2

## 2019-11-29 MED FILL — Potassium Chloride Inj 2 mEq/ML: INTRAVENOUS | Qty: 40 | Status: AC

## 2019-11-29 MED FILL — Sodium Bicarbonate IV Soln 8.4%: INTRAVENOUS | Qty: 50 | Status: AC

## 2019-11-29 MED FILL — Electrolyte-R (PH 7.4) Solution: INTRAVENOUS | Qty: 5000 | Status: AC

## 2019-11-29 MED FILL — Lidocaine HCl Local Soln Prefilled Syringe 100 MG/5ML (2%): INTRAMUSCULAR | Qty: 5 | Status: AC

## 2019-11-29 MED FILL — Heparin Sodium (Porcine) Inj 1000 Unit/ML: INTRAMUSCULAR | Qty: 30 | Status: AC

## 2019-11-29 MED FILL — Mannitol IV Soln 20%: INTRAVENOUS | Qty: 500 | Status: AC

## 2019-11-29 MED FILL — Magnesium Sulfate Inj 50%: INTRAMUSCULAR | Qty: 10 | Status: AC

## 2019-11-29 MED FILL — Sodium Chloride IV Soln 0.9%: INTRAVENOUS | Qty: 2000 | Status: AC

## 2019-11-29 NOTE — Plan of Care (Signed)
Continue to monitor

## 2019-11-29 NOTE — Progress Notes (Signed)
CARDIAC REHAB PHASE I   PRE:  Rate/Rhythm: 85 SR with PACs    BP: sitting 109/55    SaO2: 92 RA  MODE:  Ambulation: 240 ft   POST:  Rate/Rhythm: 91 SR with many PACs    BP: sitting 112/65    SaO2: 97 RA  Pt in bathroom on arrival. Able to stand fairly independently with rocking and RW. Ambulated hall quite slowly with RW. Steady. Fatigued with distance. He did not require O2 while walking. To BR after walk. Passing gas. Pt HR irregular with many PACs but still NSR.  To recliner. C/o sternal pain. Encouraged x2 more walks, recliner, and IS. 159mL on IS. [id number]  [place], ACSM 11/29/2019 12:03 PM

## 2019-11-29 NOTE — Progress Notes (Signed)
Inpatient Diabetes Program Recommendations  AACE/ADA: New Consensus Statement on Inpatient Glycemic Control (2015)  Target Ranges:  Prepandial:   less than 140 mg/dL      Peak postprandial:   less than 180 mg/dL (1-2 hours)      Critically ill patients:  140 - 180 mg/dL   Lab Results  Component Value Date   GLUCAP 269 (H) 11/29/2019   HGBA1C 11.4 (H) 11/22/2019    Review of Glycemic Control Results for KEEAN, WILMETH (MRN [id number]) as of 11/29/2019 12:50  Ref. Range 11/28/2019 06:53 11/28/2019 11:15 11/28/2019 16:24 11/28/2019 21:25 11/29/2019 06:10 11/29/2019 08:18 11/29/2019 11:26  Glucose-Capillary Latest Ref Range: 70 - 99 mg/dL 203 (H) 266 (H) 286 (H) 167 (H) 208 (H) 220 (H) 269 (H)   Diabetes history: DM 2 Outpatient Diabetes medications: Tresiba 30 units, metformin 500 mg bid Current orders for Inpatient glycemic control:  Metformin 1000 mg bid Novolog 0-24 units tid and hs Novolog 6 units tid Levemir 40 units Daily   Increase Levemir to 50 units.  Spoke with pt and wife at bedside regarding DM management at home. Pt reports A1c level was over 12% a few weeks ago. Pt reports he has not been following what he should regarding diet and checks his glucose only daily.  Spoke with pt about checking glucose twice a day. PT had not had formal DM nutrition education in the past. Spoke to him and wife regarding carbs, portion sizes, and extensive diet education. Discussed importance of glucose control on wound healing and heart health. Mentioned to pt to possibly see Endocrinologist in the future.  Thanks, Tama Headings RN, MSN, BC-ADM Inpatient Diabetes Coordinator Team Pager [phone] (8a-5p)

## 2019-11-29 NOTE — Progress Notes (Signed)
Mobility Specialist: Progress Note    11/29/19 1602  Mobility  Activity Ambulated in hall  Level of Assistance Modified independent, requires aide device or extra time  Assistive Device Front wheel walker  Distance Ambulated (ft) 270 ft (Took 5 standing rest breaks lasting a few seconds)  Mobility Response Tolerated well  Mobility performed by Mobility specialist  Bed Position Chair  $Mobility charge 1 Mobility   Pre-Mobility: 82 HR, 97/44 BP, 92% SpO2 Post-Mobility: 94 HR, 124/57 BP, 94% SpO2  Pt c/o of dizziness before and during ambulation but stated it wasn't too bad. Pt stopped several times for standing rest breaks lasting only a few seconds.   [facility] Synthia Fairbank Mobility Specialist

## 2019-11-29 NOTE — Progress Notes (Signed)
3 Days Post-Op Procedure(s) (LRB): CORONARY ARTERY BYPASS GRAFTING (CABG) X Two , using left internal mammary artery and right leg greater saphenous vein harvested endoscopically. (N/A) AORTIC VALVE REPLACEMENT (AVR) USING INSPIRIS AORTIC VALVE SIZE 23MM (N/A) TRANSESOPHAGEAL ECHOCARDIOGRAM (TEE) (N/A) Subjective: No complaints. Passing flatus but no BM yet  Objective: Vital signs in last 24 hours: Temp:  [97.7 F (36.5 C)-99 F (37.2 C)] 97.9 F (36.6 C) (06/10 0535) Pulse Rate:  [76-92] 92 (06/10 0535) Cardiac Rhythm: Normal sinus rhythm (06/10 0753) Resp:  [15-21] 21 (06/10 0535) BP: (106-143)/(56-69) 143/69 (06/10 0535) SpO2:  [92 %-99 %] 92 % (06/10 0535) Weight:  [120.2 kg] 120.2 kg (06/10 0523)  Hemodynamic parameters for last 24 hours:    Intake/Output from previous day: 06/09 0701 - 06/10 0700 In: 340 [P.O.:340] Out: -  Intake/Output this shift: No intake/output data recorded.  General appearance: alert and cooperative Neurologic: intact Heart: regular rate and rhythm, S1, S2 normal, no murmur Lungs: clear to auscultation bilaterally Extremities: edema resolved Wound: dressing dry  Lab Results: Recent Labs    11/27/19 1630 11/28/19 0513  WBC 11.8* 12.7*  HGB 12.6* 12.2*  HCT 37.6* 37.4*  PLT 90* 96*   BMET:  Recent Labs    11/27/19 1630 11/28/19 0513  NA 133* 136  K 4.4 4.0  CL 100 101  CO2 25 28  GLUCOSE 315* 218*  BUN 14 15  CREATININE 0.96 0.87  CALCIUM 8.0* 8.0*    PT/INR:  Recent Labs    11/26/19 1548  LABPROT 14.8  INR 1.2   ABG    Component Value Date/Time   PHART 7.289 (L) 11/27/2019 0206   HCO3 26.2 11/27/2019 0206   TCO2 28 11/27/2019 0206   ACIDBASEDEF 1.0 11/27/2019 0206   O2SAT 97.0 11/27/2019 0206   CBG (last 3)  Recent Labs    11/28/19 1115 11/28/19 1624 11/28/19 2125  GLUCAP 266* 286* 167*    Assessment/Plan: S/P Procedure(s) (LRB): CORONARY ARTERY BYPASS GRAFTING (CABG) X Two , using left internal  mammary artery and right leg greater saphenous vein harvested endoscopically. (N/A) AORTIC VALVE REPLACEMENT (AVR) USING INSPIRIS AORTIC VALVE SIZE 23MM (N/A) TRANSESOPHAGEAL ECHOCARDIOGRAM (TEE) (N/A)  POD 3  Hemodynamically stable in sinus rhythm. Continue Lopressor. Will resume losartan at discharge.  DM: glucose remains mid 200's most of the time. Will resume Metformin at 1000 bid. Continue Levemir and meal coverage with SSI.  Volume excess: wt trending down slowly. About 3 lbs over preop. Continue lasix for now.  IS, ambulation.   LOS: 3 days    Gaye Pollack 11/29/2019

## 2019-11-30 LAB — GLUCOSE, CAPILLARY
Glucose-Capillary: 186 mg/dL — ABNORMAL HIGH (ref 70–99)
Glucose-Capillary: 234 mg/dL — ABNORMAL HIGH (ref 70–99)
Glucose-Capillary: 198 mg/dL — ABNORMAL HIGH (ref 70–99)
Glucose-Capillary: 164 mg/dL — ABNORMAL HIGH (ref 70–99)

## 2019-11-30 MED ORDER — INSULIN DETEMIR 100 UNIT/ML ~~LOC~~ SOLN
50.0000 [IU] | Freq: Every day | SUBCUTANEOUS | Status: DC
  Administered 2019-11-30: 50 [IU] via SUBCUTANEOUS
  Filled 2019-11-30 (×2): qty 0.5

## 2019-11-30 NOTE — Progress Notes (Signed)
Discussed sternal precautions, IS, exercise, diet, and CRPII with pt and wife. Receptive. Will refer to [place], ACSM 2:46 PM 11/30/2019

## 2019-11-30 NOTE — Progress Notes (Signed)
CARDIAC REHAB PHASE I   PRE:  Rate/Rhythm: 35 SR with PACs    BP: sitting 109/64    SaO2: 94-97 RA  MODE:  Ambulation: 400 ft   POST:  Rate/Rhythm: 95 SR with PACs    BP: sitting 129/76     SaO2: 97 RA  Pt stronger today. Min assist to stand, verbal cues. Used RW in hall, quicker pace, less rest, increased distance. No major c/o. Return to recliner. Encouraged x2 more walks and IS. Will f/u with education today if time allows. Wife supportive, gave her diets and walking guidelines. [place], ACSM 11/30/2019 12:10 PM

## 2019-11-30 NOTE — Progress Notes (Addendum)
[address]       [place] [address]             [phone]      4 Days Post-Op Procedure(s) (LRB): CORONARY ARTERY BYPASS GRAFTING (CABG) X Two , using left internal mammary artery and right leg greater saphenous vein harvested endoscopically. (N/A) AORTIC VALVE REPLACEMENT (AVR) USING INSPIRIS AORTIC VALVE SIZE 23MM (N/A) TRANSESOPHAGEAL ECHOCARDIOGRAM (TEE) (N/A) Subjective: Feels good this morning other than some occasional lightheadedness.   Objective: Vital signs in last 24 hours: Temp:  [97.9 F (36.6 C)-99 F (37.2 C)] 98.2 F (36.8 C) (06/11 0410) Pulse Rate:  [78-95] 81 (06/11 0410) Cardiac Rhythm: Normal sinus rhythm (06/11 0015) Resp:  [18-20] 20 (06/11 0410) BP: (96-153)/(47-67) 96/47 (06/11 0410) SpO2:  [91 %-100 %] 96 % (06/11 0410) Weight:  [120.3 kg] 120.3 kg (06/11 0410)     Intake/Output from previous day: 06/10 0701 - 06/11 0700 In: 560 [P.O.:560] Out: 200 [Urine:200] Intake/Output this shift: No intake/output data recorded.  General appearance: alert, cooperative and no distress Heart: regular rate and rhythm, S1, S2 normal, no murmur, click, rub or gallop Lungs: clear to auscultation bilaterally Abdomen: soft, non-tender; bowel sounds normal; no masses,  no organomegaly Extremities: extremities normal, atraumatic, no cyanosis or edema Wound: clean and dry covered with a sterile dressing  Lab Results: Recent Labs    11/27/19 1630 11/28/19 0513  WBC 11.8* 12.7*  HGB 12.6* 12.2*  HCT 37.6* 37.4*  PLT 90* 96*   BMET:  Recent Labs    11/27/19 1630 11/28/19 0513  NA 133* 136  K 4.4 4.0  CL 100 101  CO2 25 28  GLUCOSE 315* 218*  BUN 14 15  CREATININE 0.96 0.87  CALCIUM 8.0* 8.0*    PT/INR: No results for input(s): LABPROT, INR in the last 72 hours. ABG    Component Value Date/Time   PHART 7.289 (L) 11/27/2019 0206   HCO3 26.2 11/27/2019 0206   TCO2 28 11/27/2019 0206   ACIDBASEDEF 1.0 11/27/2019 0206    O2SAT 97.0 11/27/2019 0206   CBG (last 3)  Recent Labs    11/29/19 1654 11/29/19 2142 11/30/19 0622  GLUCAP 241* 210* 186*    Assessment/Plan: S/P Procedure(s) (LRB): CORONARY ARTERY BYPASS GRAFTING (CABG) X Two , using left internal mammary artery and right leg greater saphenous vein harvested endoscopically. (N/A) AORTIC VALVE REPLACEMENT (AVR) USING INSPIRIS AORTIC VALVE SIZE 23MM (N/A) TRANSESOPHAGEAL ECHOCARDIOGRAM (TEE) (N/A)  1. CV-NSR in the 90s, BP a little low this morning. Continue asa, metoprolol, and crestor 2. Pulm-On room air. Encouraged to use incentive spirometer.  3. Renal-creatinine 0.87, weight is up 2kg from baseline. Continue daily lasix 4. H and H-12.2/37.4, expected acute blood loss anemia.  5. Endo-Blood glucose remains uncontrolled. He is on metformin, levemir and SSI with meal coverage. Will increase levemir per diabetes coordinator's recs  Plan: Continue to work on ambulation. Continue to work on diabetes control. Continue diuresis, he is about 2kg over baseline. Will need close follow-up with an endocrinologist outpatient to assist with DM control.     LOS: 4 days    Elgie Collard 11/30/2019   Chart reviewed, patient examined, agree with above. He will need to go home on metformin 1000 bid and higher dose of insulin than he was on before. He was on once daily Tresiba at home but I doubt that it is going to be enough to control him adequately. He really needs  to see an endocrinologist.

## 2019-12-01 LAB — GLUCOSE, CAPILLARY
Glucose-Capillary: 157 mg/dL — ABNORMAL HIGH (ref 70–99)
Glucose-Capillary: 162 mg/dL — ABNORMAL HIGH (ref 70–99)
Glucose-Capillary: 136 mg/dL — ABNORMAL HIGH (ref 70–99)

## 2019-12-01 MED ORDER — INSULIN DETEMIR 100 UNIT/ML ~~LOC~~ SOLN
55.0000 [IU] | Freq: Every day | SUBCUTANEOUS | Status: DC
  Administered 2019-12-01 – 2019-12-02 (×2): 55 [IU] via SUBCUTANEOUS
  Filled 2019-12-01 (×2): qty 0.55

## 2019-12-01 MED ORDER — LOSARTAN POTASSIUM 25 MG PO TABS
25.0000 mg | ORAL_TABLET | Freq: Every day | ORAL | Status: DC
  Administered 2019-12-01 – 2019-12-02 (×2): 25 mg via ORAL
  Filled 2019-12-01 (×2): qty 1

## 2019-12-01 NOTE — Progress Notes (Signed)
Mobility Specialist - Progress Note   12/01/19 1200  Mobility  Activity Ambulated in hall  Level of Assistance Modified independent, requires aide device or extra time  Assistive Device Front wheel walker  Distance Ambulated (ft) 400 ft  Mobility Response Tolerated well  Mobility performed by Family member  $Mobility charge 1 Mobility   I saw the pt ambulating in the hall again with his family member, he again said he felt fine while walking.   [place] Specialist

## 2019-12-01 NOTE — Progress Notes (Signed)
Mobility Specialist - Progress Note   12/01/19 1500  Mobility  Activity Ambulated in hall  Level of Assistance Modified independent, requires aide device or extra time  Assistive Device Front wheel walker  Mobility Response Tolerated well  Mobility performed by Family member  $Mobility charge 1 Mobility   Pt seen ambulating in hall with family member. Distance not reported as I did not see the whole walk.   [place] Specialist

## 2019-12-01 NOTE — Progress Notes (Signed)
Pt is walking well with wife and RW. He would like a RW for home.  Yves Dill CES, ACSM 11:16 AM 12/01/2019

## 2019-12-01 NOTE — Progress Notes (Signed)
Mobility Specialist - Progress Note   12/01/19 1101  Mobility  Activity Ambulated in hall  Level of Assistance Modified independent, requires aide device or extra time  Assistive Device Front wheel walker  Distance Ambulated (ft) 510 ft  Mobility Response Tolerated well  Mobility performed by Family member  $Mobility charge 1 Mobility   Pt seen ambulating in hall with a family member, he said he felt well throughout his walk. He expressed willingness to go on another walk at a later time today as well.   [place] Specialist

## 2019-12-01 NOTE — Progress Notes (Signed)
[address]       [place] [address]             [phone]      5 Days Post-Op Procedure(s) (LRB): CORONARY ARTERY BYPASS GRAFTING (CABG) X Two , using left internal mammary artery and right leg greater saphenous vein harvested endoscopically. (N/A) AORTIC VALVE REPLACEMENT (AVR) USING INSPIRIS AORTIC VALVE SIZE 23MM (N/A) TRANSESOPHAGEAL ECHOCARDIOGRAM (TEE) (N/A) Subjective: Feels pretty well, some chest incis discomfort  Objective: Vital signs in last 24 hours: Temp:  [97.6 F (36.4 C)-98.8 F (37.1 C)] 98.8 F (37.1 C) (06/11 2339) Pulse Rate:  [81-88] 81 (06/11 2339) Cardiac Rhythm: Normal sinus rhythm (06/12 0315) Resp:  [16-21] 19 (06/11 2339) BP: (121-162)/(61-76) 121/61 (06/11 2339) SpO2:  [92 %-95 %] 92 % (06/11 2339) Weight:  [119.9 kg] 119.9 kg (06/12 0500)  Hemodynamic parameters for last 24 hours:    Intake/Output from previous day: No intake/output data recorded. Intake/Output this shift: No intake/output data recorded.  General appearance: alert, cooperative and no distress Heart: regular rate and rhythm Lungs: mildly dim in bases Abdomen: benign Extremities: + edema Wound: incis healing well  Lab Results: No results for input(s): WBC, HGB, HCT, PLT in the last 72 hours. BMET: No results for input(s): NA, K, CL, CO2, GLUCOSE, BUN, CREATININE, CALCIUM in the last 72 hours.  PT/INR: No results for input(s): LABPROT, INR in the last 72 hours. ABG    Component Value Date/Time   PHART 7.289 (L) 11/27/2019 0206   HCO3 26.2 11/27/2019 0206   TCO2 28 11/27/2019 0206   ACIDBASEDEF 1.0 11/27/2019 0206   O2SAT 97.0 11/27/2019 0206   CBG (last 3)  Recent Labs    11/30/19 1646 11/30/19 2131 12/01/19 0618  GLUCAP 198* 164* 157*    Meds Scheduled Meds: . allopurinol  300 mg Oral Daily  . aspirin EC  325 mg Oral Daily  . docusate sodium  200 mg Oral Daily  . enoxaparin (LOVENOX) injection  40 mg Subcutaneous QHS  .  ezetimibe  10 mg Oral Daily  . furosemide  40 mg Oral Daily  . insulin aspart  0-24 Units Subcutaneous TID AC & HS  . insulin aspart  6 Units Subcutaneous TID WC  . insulin detemir  50 Units Subcutaneous Daily  . mouth rinse  15 mL Mouth Rinse BID  . metFORMIN  1,000 mg Oral BID WC  . metoprolol tartrate  25 mg Oral BID  . pantoprazole  40 mg Oral QAC breakfast  . potassium chloride  20 mEq Oral BID  . rosuvastatin  10 mg Oral Daily  . sodium chloride flush  3 mL Intravenous Q12H  . tamsulosin  0.4 mg Oral Daily   Continuous Infusions: . sodium chloride     PRN Meds:.sodium chloride, acetaminophen, bisacodyl **OR** bisacodyl, ondansetron **OR** ondansetron (ZOFRAN) IV, oxyCODONE, sodium chloride flush, traMADol  Xrays No results found.  Assessment/Plan: S/P Procedure(s) (LRB): CORONARY ARTERY BYPASS GRAFTING (CABG) X Two , using left internal mammary artery and right leg greater saphenous vein harvested endoscopically. (N/A) AORTIC VALVE REPLACEMENT (AVR) USING INSPIRIS AORTIC VALVE SIZE 23MM (N/A) TRANSESOPHAGEAL ECHOCARDIOGRAM (TEE) (N/A)  1 doing well POD #4 2 stable vitals but BP is variable was on ARB, creat ok will restart at 1/2 dose 3 some PVC's, cont current beta blocker, most recent MG++, K+ pk 4 cont diuresis 5 d/c wires today 6 increase insulin a little further, CBG's a little better 7 cont rehab/pulm  toilet 8 poss home in am  LOS: 5 days    John Giovanni PA-C Pager [phone] 12/01/2019

## 2019-12-01 NOTE — Care Management (Signed)
Rolling walker ordered for patient for delivery to room prior to dc.

## 2019-12-02 LAB — GLUCOSE, CAPILLARY: Glucose-Capillary: 124 mg/dL — ABNORMAL HIGH (ref 70–99)

## 2019-12-02 MED ORDER — "INSULIN SYRINGE 27G X 1/2"" 0.5 ML MISC": 1 refills | Status: DC

## 2019-12-02 MED ORDER — TRAMADOL HCL 50 MG PO TABS: 50.0000 mg | ORAL_TABLET | Freq: Four times a day (QID) | ORAL | 0 refills | Status: AC | PRN

## 2019-12-02 MED ORDER — METOPROLOL TARTRATE 25 MG PO TABS: 25.0000 mg | ORAL_TABLET | Freq: Two times a day (BID) | ORAL | 1 refills | Status: DC

## 2019-12-02 MED ORDER — ASPIRIN 325 MG PO TBEC: 325.0000 mg | DELAYED_RELEASE_TABLET | Freq: Every day | ORAL | Status: DC

## 2019-12-02 MED ORDER — METFORMIN HCL 1000 MG PO TABS: 1000.0000 mg | ORAL_TABLET | Freq: Two times a day (BID) | ORAL | 1 refills | Status: DC

## 2019-12-02 MED ORDER — POTASSIUM CHLORIDE CRYS ER 20 MEQ PO TBCR: 20.0000 meq | EXTENDED_RELEASE_TABLET | Freq: Every day | ORAL | 0 refills | Status: DC

## 2019-12-02 MED ORDER — INSULIN DETEMIR 100 UNIT/ML ~~LOC~~ SOLN: 55.0000 [IU] | Freq: Every day | SUBCUTANEOUS | 3 refills | Status: DC

## 2019-12-02 MED ORDER — FUROSEMIDE 40 MG PO TABS: 40.0000 mg | ORAL_TABLET | Freq: Every day | ORAL | 0 refills | Status: DC

## 2019-12-02 MED ORDER — INSULIN ASPART 100 UNIT/ML ~~LOC~~ SOLN: 6.0000 [IU] | Freq: Three times a day (TID) | SUBCUTANEOUS | 1 refills | Status: DC

## 2019-12-02 NOTE — Discharge Instructions (Signed)
Endoscopic Saphenous Vein Harvesting, Care After This sheet gives you information about how to care for yourself after your procedure. Your health care provider may also give you more specific instructions. If you have problems or questions, contact your health care provider. What can I expect after the procedure? After the procedure, it is common to have:  Pain.  Bruising.  Swelling.  Numbness. Follow these instructions at home: Incision care   Follow instructions from your health care provider about how to take care of your incisions. Make sure you: ? Wash your hands with soap and water before and after you change your bandages (dressings). If soap and water are not available, use hand sanitizer. ? Change your dressings as told by your health care provider. ? Leave stitches (sutures), skin glue, or adhesive strips in place. These skin closures may need to stay in place for 2 weeks or longer. If adhesive strip edges start to loosen and curl up, you may trim the loose edges. Do not remove adhesive strips completely unless your health care provider tells you to do that.  Check your incision areas every day for signs of infection. Check for: ? More redness, swelling, or pain. ? Fluid or blood. ? Warmth. ? Pus or a bad smell. Medicines  Take over-the-counter and prescription medicines only as told by your health care provider.  Ask your health care provider if the medicine prescribed to you requires you to avoid driving or using heavy machinery. General instructions  Raise (elevate) your legs above the level of your heart while you are sitting or lying down.  Avoid crossing your legs.  Avoid sitting for long periods of time. Change positions every 30 minutes.  Do any exercises your health care providers have given you. These may include deep breathing, coughing, and walking exercises.  Do not take baths, swim, or use a hot tub until your health care provider approves. Ask your  health care provider if you may take showers. You may only be allowed to take sponge baths.  Wear compression stockings as told by your health care provider. These stockings help to prevent blood clots and reduce swelling in your legs.  Keep all follow-up visits as told by your health care provider. This is important. Contact a health care provider if:  Medicine does not help your pain.  Your pain gets worse.  You have new leg bruises or your leg bruises get bigger.  Your leg feels numb.  You have more redness, swelling, or pain around your incision.  You have fluid or blood coming from your incision.  Your incision feels warm to the touch.  You have pus or a bad smell coming from your incision.  You have a fever. Get help right away if:  Your pain is severe.  You develop pain, tenderness, warmth, redness, or swelling in any part of your leg.  You have chest pain.  You have trouble breathing. Summary  Raise (elevate) your legs above the level of your heart while you are sitting or lying down.  Wear compression stockings as told by your health care provider.  Make sure you know which symptoms should prompt you to contact your health care provider.  Keep all follow-up visits as told by your health care provider. This information is not intended to replace advice given to you by your health care provider. Make sure you discuss any questions you have with your health care provider. Document Revised: 05/15/2018 Document Reviewed: 05/15/2018 Elsevier Patient Education    2020 [employer] Coronary Artery Bypass Grafting, Care After This sheet gives you information about how to care for yourself after your procedure. Your doctor may also give you more specific instructions. If you have problems or questions, call your doctor. What can I expect after the procedure? After the procedure, it is common to:  Feel sick to your stomach (nauseous).  Not want to eat as much as  normal (lack of appetite).  Have trouble pooping (constipation).  Have weakness and tiredness (fatigue).  Feel sad (depressed) or grouchy (irritable).  Have pain or discomfort around the cuts from surgery (incisions). Follow these instructions at home: Medicines  Take over-the-counter and prescription medicines only as told by your doctor. Do not stop taking medicines or start any new medicines unless your doctor says it is okay.  If you were prescribed an antibiotic medicine, take it as told by your doctor. Do not stop taking the antibiotic even if you start to feel better. Incision care   Follow instructions from your doctor about how to take care of your cuts from surgery. Make sure you: ? Wash your hands with soap and water before and after you change your bandage (dressing). If you cannot use soap and water, use hand sanitizer. ? Change your bandage as told by your doctor. ? Leave stitches (sutures), skin glue, or skin tape (adhesive) strips in place. They may need to stay in place for 2 weeks or longer. If tape strips get loose and curl up, you may trim the loose edges. Do not remove tape strips completely unless your doctor says it is okay.  Make sure the surgery cuts are clean, dry, and protected.  Check your cut areas every day for signs of infection. Check for: ? More redness, swelling, or pain. ? More fluid or blood. ? Warmth. ? Pus or a bad smell.  If cuts were made in your legs: ? Avoid crossing your legs. ? Avoid sitting for long periods of time. Change positions every 30 minutes. ? Raise (elevate) your legs when you are sitting. Bathing  Do not take baths, swim, or use a hot tub until your doctor says it is okay.  You may shower. Pat the surgery cuts dry. Do not rub the cuts to dry.  Eating and drinking   Eat foods that are high in fiber, such as beans, nuts, whole grains, and raw fruits and vegetables. Any meats you eat should be lean cut. Avoid canned,  processed, and fried foods. This can help prevent trouble pooping. This is also a part of a heart-healthy diet.  Drink enough fluid to keep your pee (urine) pale yellow.  Do not drink alcohol until you are fully recovered. Ask your doctor when it is safe to drink alcohol. Activity  Rest and limit your activity as told by your doctor. You may be told to: ? Stop any activity right away if you have chest pain, shortness of breath, irregular heartbeats, or dizziness. Get help right away if you have any of these symptoms. ? Move around often for short periods or take short walks as told by your doctor. Slowly increase your activities. ? Avoid lifting, pushing, or pulling anything that is heavier than 10 lb (4.5 kg) for at least 6 weeks or as told by your doctor.  Do physical therapy or a cardiac rehab (cardiac rehabilitation) program as told by your doctor. ? Physical therapy involves doing exercises to maintain movement and build strength and endurance. ? A cardiac rehab   includes:  Exercise training.  Education.  Counseling.  Do not drive until your doctor says it is okay.  Ask your doctor when you can go back to work.  Ask your doctor when you can be sexually active. General instructions  Do not drive or use heavy machinery while taking prescription pain medicine.  Do not use any products that contain nicotine or tobacco. These include cigarettes, e-cigarettes, and chewing tobacco. If you need help quitting, ask your doctor.  Take 2-3 deep breaths every few hours during the day while you get better. This helps expand your lungs and prevent problems.  If you were given a device called an incentive spirometer, use it several times a day to practice deep breathing. Support your chest with a pillow or your arms when you take deep breaths or cough.  Wear compression stockings as told by your doctor.  Weigh yourself every day. This helps to see if your body is holding  (retaining) fluid that may make your heart and lungs work harder.  Keep all follow-up visits as told by your doctor. This is important. Contact a doctor if:  You have more redness, swelling, or pain around any cut.  You have more fluid or blood coming from any cut.  Any cut feels warm to the touch.  You have pus or a bad smell coming from any cut.  You have a fever.  You have swelling in your ankles or legs.  You have pain in your legs.  You gain 2 lb (0.9 kg) or more a day.  You feel sick to your stomach or you throw up (vomit).  You have watery poop (diarrhea). Get help right away if:  You have chest pain that goes to your jaw or arms.  You are short of breath.  You have a fast or irregular heartbeat.  You notice a "clicking" in your breastbone (sternum) when you move.  You have any signs of a stroke. "BE FAST" is an easy way to remember the main warning signs: ? B - Balance. Signs are dizziness, sudden trouble walking, or loss of balance. ? E - Eyes. Signs are trouble seeing or a change in how you see. ? F - Face. Signs are sudden weakness or loss of feeling of the face, or the face or eyelid drooping on one side. ? A - Arms. Signs are weakness or loss of feeling in an arm. This happens suddenly and usually on one side of the body. ? S - Speech. Signs are sudden trouble speaking, slurred speech, or trouble understanding what people say. ? T - Time. Time to call emergency services. Write down what time symptoms started.  You have other signs of a stroke, such as: ? A sudden, very bad headache with no known cause. ? Feeling sick to your stomach. ? Throwing up. ? Jerky movements you cannot control (seizure). These symptoms may be an emergency. Do not wait to see if the symptoms will go away. Get medical help right away. Call your local emergency services (911 in the [place] Do not drive yourself to the hospital. Summary  After the procedure, it is common to have pain  or discomfort in the cuts from surgery (incisions).  Do not take baths, swim, or use a hot tub until your doctor says it is okay.  Slowly increase your activities. You may need physical therapy or cardiac rehab.  Weigh yourself every day. This helps to see if your body is holding fluid. This information  is not intended to replace advice given to you by your health care provider. Make sure you discuss any questions you have with your health care provider. Document Revised: 02/14/2018 Document Reviewed: 02/14/2018 Elsevier Patient Education  2020 [place]. Surgical Aortic Valve Replacement, Care After This sheet gives you information about how to care for yourself after your procedure. Your health care provider may also give you more specific instructions. If you have problems or questions, contact your health care provider. What can I expect after the procedure? After the procedure, it is common to have pain around your incision area. Follow these instructions at home: Medicines  Take over-the-counter and prescription medicines only as told by your health care provider.  If you were prescribed an antibiotic medicine, take it as told by your health care provider. Do not stop taking the antibiotic even if you start to feel better.  If you have a mechanical prosthesis, you may be given a blood thinner called warfarin. Follow instructions carefully on how to take this medicine.  Ask your health care provider if the medicine prescribed to you: ? Requires you to avoid driving or using heavy machinery. ? Can cause constipation. You may need to take actions to prevent or treat constipation, such as:  Take over-the-counter or prescription medicines.  Eat foods that are high in fiber, such as beans, whole grains, and fresh fruits and vegetables.  Limit foods that are high in fat and processed sugars, such as fried or sweet foods. Eating and drinking      Limit how much caffeine you  drink. Caffeine can affect your heart's rate and rhythm.  Do not drink alcohol if: ? Your health care provider tells you not to drink. ? You are pregnant, may be pregnant, or are planning to become pregnant.  Drink enough fluid to keep your urine pale yellow.  Eat a heart-healthy diet that includes fruits, vegetables, whole grains, low-fat dairy products, and lean proteins like poultry and eggs. Incision care   Follow instructions from your health care provider about how to take care of your incision. Make sure you: ? Wash your hands with soap and water before and after you change your bandage (dressing). If soap and water are not available, use hand sanitizer. ? Change your dressing as told by your health care provider. ? Leave stitches (sutures), skin glue, or adhesive strips in place. These skin closures may need to stay in place for 2 weeks or longer. If adhesive strip edges start to loosen and curl up, you may trim the loose edges. Do not remove adhesive strips completely unless your health care provider tells you to do that.  Check your incision area every day for signs of infection. Check for: ? Redness, swelling, or increasing pain. ? Fluid or blood. ? Warmth. ? Pus or a bad smell. Activity  Return to your normal activities as told by your health care provider. Most patients will need to limit any lifting or strenuous activity for 4-6 weeks.  Avoid sitting for a long time without moving. Get up to take short walks every 1-2 hours.  Do exercises as told by your health care provider.  Do not lift anything that is heavier than 10 lb (4.5 kg), or the limit that you are told, until your health care provider says that it is safe.  Avoid pushing or pulling things with your arms until your health care provider approves. This includes pulling on handrails to help you climb stairs. Lifestyle  Do not use any products that contain nicotine or tobacco, such as cigarettes, e-cigarettes,  and chewing tobacco. These can delay incision healing after surgery. If you need help quitting, ask your health care provider.  Resume sexual activity as told by your health care provider. If you have erectile dysfunction, do not use medicines to treat this condition unless your health care provider approves.  Work with your health care provider to: ? Keep your blood pressure and cholesterol under control. ? Manage any other heart conditions that you have. ? Maintain a healthy weight. Driving and travel  Do not drive until your health care provider approves. Ask your health care provider when it is safe for you to drive.  Avoid airplane travel for as long as told by your health care provider.  When you travel, bring a list of your medicines and a record of your medical history with you. Carry your medicines with you. General instructions  Do not take baths, swim, or use a hot tub until your health care provider approves.  Do not strain to have a bowel movement.  Avoid crossing your legs while sitting down.  Check your temperature every day for a fever. A fever may be a sign of infection.  If you are a woman and you plan to become pregnant, talk with your health care provider before you become pregnant.  Wear compression stockings as told by your health care provider. These stockings help to prevent blood clots and reduce swelling in your legs.  Tell all health care providers who care for you that you have an artificial (prosthetic) aortic valve. Also, tell them if you have or have had heart disease or endocarditis.  You will be given a card at discharge. The card indicates the type of prosthetic valve that you have. Keep this card in your wallet or purse for quick reference in case of an emergency.  Keep all follow-up visits as told by your health care provider. This is important. Contact a health care provider if:  You develop a skin rash.  You experience sudden, unexplained  changes in your weight.  You have redness, swelling, or increasing pain around your incision.  You have fluid or blood coming from your incision.  Your incision feels warm to the touch.  You have pus or a bad smell coming from your incision.  You have a fever. Get help right away if you:  Develop chest pain that is different from the pain coming from your incision.  Develop shortness of breath or difficulty breathing.  Start to feel light-headed. These symptoms may represent a serious problem that is an emergency. Do not wait to see if the symptoms will go away. Get medical help right away. Call your local emergency services (911 in the [place] Do not drive yourself to the hospital. Summary  After this procedure, it is common to have pain in the incision area.  Eat a heart-healthy diet. Follow instructions about alcohol use.  Get up to walk often. Avoid pushing and pulling with your arms. Ask what activities are safe for you.  Care for your incision as told by your health care provider. Check it daily for signs of infection.  Get help right away if you have chest pain that is different from your incisions, develop shortness of breath or difficulty breathing, or start to feel light-headed. This information is not intended to replace advice given to you by your health care provider. Make sure you discuss any questions you  have with your health care provider. Document Revised: 03/02/2018 Document Reviewed: 03/02/2018 Elsevier Patient Education  2020 [employer].

## 2019-12-02 NOTE — Progress Notes (Signed)
Discharged to home with family office visits in place teaching done /  Before discharge I got the pt to demonstrate  Drawing up 55 units of insulin and demonstrate drawing up 6 units of insulin. This was the first time the pt would  Be drawing up his own insulin at home in the past he used a pin. The pt successfully demonstrated drawing up the insulin 55 unit and 6 units. I also strongly urged the pt to keep his long lasting insulin separate for the short acting insulin so that they never get mixed up. I repeated mutable times how dangerous it would to get the two mix up.

## 2019-12-02 NOTE — Progress Notes (Signed)
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 (N/A) AORTIC VALVE REPLACEMENT (AVR) USING INSPIRIS AORTIC VALVE SIZE 23MM (N/A) TRANSESOPHAGEAL ECHOCARDIOGRAM (TEE) (N/A) Subjective: Feels well, some surgical soreness , improving  Objective: Vital signs in last 24 hours: Temp:  [97.8 F (36.6 C)-98.6 F (37 C)] 98 F (36.7 C) (06/13 0447) Pulse Rate:  [74-85] 79 (06/13 0447) Cardiac Rhythm: Normal sinus rhythm (06/13 0447) Resp:  [18-23] 20 (06/13 0447) BP: (105-148)/(59-69) 134/69 (06/13 0447) SpO2:  [93 %-98 %] 96 % (06/13 0447) Weight:  [118.6 kg] 118.6 kg (06/13 0611)  Hemodynamic parameters for last 24 hours:    Intake/Output from previous day: No intake/output data recorded. Intake/Output this shift: No intake/output data recorded.  General appearance: alert, cooperative and no distress Heart: regular rate and rhythm Lungs: clear to auscultation bilaterally Abdomen: benign Extremities: min edema Wound: incis healing well  Lab Results: No results for input(s): WBC, HGB, HCT, PLT in the last 72 hours. BMET: No results for input(s): NA, K, CL, CO2, GLUCOSE, BUN, CREATININE, CALCIUM in the last 72 hours.  PT/INR: No results for input(s): LABPROT, INR in the last 72 hours. ABG    Component Value Date/Time   PHART 7.289 (L) 11/27/2019 0206   HCO3 26.2 11/27/2019 0206   TCO2 28 11/27/2019 0206   ACIDBASEDEF 1.0 11/27/2019 0206   O2SAT 97.0 11/27/2019 0206   CBG (last 3)  Recent Labs    12/01/19 1845 12/01/19 2126 12/02/19 0613  GLUCAP 162* 136* 124*    Meds Scheduled Meds:  allopurinol  300 mg Oral Daily   aspirin EC  325 mg Oral Daily   docusate sodium  200 mg Oral Daily   enoxaparin (LOVENOX) injection  40 mg Subcutaneous  QHS   ezetimibe  10 mg Oral Daily   furosemide  40 mg Oral Daily   insulin aspart  0-24 Units Subcutaneous TID AC & HS   insulin aspart  6 Units Subcutaneous TID WC   insulin detemir  55 Units Subcutaneous Daily   losartan  25 mg Oral Daily   mouth rinse  15 mL Mouth Rinse BID   metFORMIN  1,000 mg Oral BID WC   metoprolol tartrate  25 mg Oral BID   pantoprazole  40 mg Oral QAC breakfast   potassium chloride  20 mEq Oral BID   rosuvastatin  10 mg Oral Daily   sodium chloride flush  3 mL Intravenous Q12H   tamsulosin  0.4 mg Oral Daily   Continuous Infusions:  sodium chloride     PRN Meds:.sodium chloride, acetaminophen, bisacodyl **OR** bisacodyl, ondansetron **OR** ondansetron (ZOFRAN) IV, oxyCODONE, sodium chloride flush, traMADol  Xrays No results found.  Assessment/Plan: S/P Procedure(s) (LRB): CORONARY ARTERY BYPASS GRAFTING (CABG) X Two , using left internal mammary artery and right leg greater saphenous vein harvested endoscopically. (N/A) AORTIC VALVE REPLACEMENT (AVR) USING INSPIRIS AORTIC VALVE SIZE 23MM (N/A) TRANSESOPHAGEAL ECHOCARDIOGRAM (TEE) (N/A)  1 doing well 2 VSS, some HTN, will increase ARB to home dose 3 sats good on RA 4 Sugars better controlled- discussed further DM management- he knows to f/u with primary who manages and also our recs to see endocrinology 5 cont diuresis short term  as outpatient 6 stable for d/c   LOS: 6 days    Jeffery Giovanni PA-C Pager [phone] 12/02/2019

## 2019-12-03 ENCOUNTER — Telehealth: Payer: Self-pay

## 2019-12-03 NOTE — Telephone Encounter (Signed)
Transition Care Management Follow-up Telephone Call  Date of discharge and from where: [facility] on 12/02/19  How have you been since you were released from the hospital? Doing better, has been doing his breathing exercises, is making diet adjustments and monitoring BS more closely. Last BS reading was 136 prior to breakfast and then at lunch it was 240. Chest incision site is sore but does not have any swelling, drainage or heat at the site. The incision site on his right leg is draining a clear color with some pus on the bandage. Declines swelling, pain or warmth at site. Overall declines SOB, weakness, pain, fever or n/v/d.  Any questions or concerns? No   Items Reviewed:  Did the pt receive and understand the discharge instructions provided? Yes   Medications obtained and verified? Yes   Any new allergies since your discharge? No   Dietary orders reviewed? Yes  Do you have support at home? Yes   Other (ie: DME, Home Health, etc): Was d/c with a walker- pt is using it as needed.  Functional Questionnaire: (I = Independent and D = Dependent)  Bathing/Dressing- I   Meal Prep- I, though wife is helping currently.  Eating- I  Maintaining continence- I  Transferring/Ambulation- I, using a walker as needed.  Managing Meds- I   Follow up appointments reviewed:    PCP Hospital f/u appt confirmed? Yes , scheduled to see Dr Rosanna Randy on 12/13/19 at 3:40 PM.  [facility] f/u appt confirmed? Yes    Are transportation arrangements needed? No   If their condition worsens, is the pt aware to call  their PCP or go to the ED? Yes  Was the patient provided with contact information for the PCP's office or ED? Yes  Was the pt encouraged to call back with questions or concerns? Yes

## 2019-12-03 NOTE — Telephone Encounter (Signed)
No HFU scheduled.  

## 2019-12-03 NOTE — Telephone Encounter (Signed)
Pt returned phone for [place] regarding HSFU. Please call pt back

## 2019-12-03 NOTE — Telephone Encounter (Signed)
I have made the 1st attempt to contact the patient or family member in charge, in order to follow up from recently being discharged from the hospital. There was NANM. Will try back again later.  -MM

## 2019-12-10 ENCOUNTER — Ambulatory Visit (INDEPENDENT_AMBULATORY_CARE_PROVIDER_SITE_OTHER): Payer: Self-pay

## 2019-12-10 ENCOUNTER — Other Ambulatory Visit: Payer: Self-pay

## 2019-12-10 VITALS — Temp 97.5°F

## 2019-12-10 DIAGNOSIS — I2510 Atherosclerotic heart disease of native coronary artery without angina pectoris: Secondary | ICD-10-CM

## 2019-12-10 DIAGNOSIS — Z4802 Encounter for removal of sutures: Secondary | ICD-10-CM

## 2019-12-10 NOTE — Progress Notes (Signed)
Pt comes in for suture removal s/p CABG x 2 on 11/26/19. He is a/o x 4 and w/o complaint. Three chest tube incision sites to upper abdomen w/ sutures intact, incisions well approximated, and without s/s of infection. R inner thigh incision is intact w/ small amount of erythema. Pt states it has been draining a scant amount of drainage; states he had been in contact w/ [facility] about this last week and was advised by RN to clean daily w/ hydrogen peroxide. Advised to continue this and to report if redness and/or drainage increases or becomes purulent, and also if he becomes febrile (instructed to check temperature daily). Instructed pt to keep all incision sites clean (w/ mild soap and water) and dry. To notify office of any s/s of infection or other problems.

## 2019-12-12 NOTE — Progress Notes (Signed)
Established patient visit  I,[name],acting as a scribe for Jeffery Durie, MD.,have documented all relevant documentation on the behalf of Jeffery Durie, MD,as directed by  Jeffery Durie, MD while in the presence of Jeffery Durie, MD.   Patient: Jeffery West   DOB: December 15, 1955   64 y.o. Male  MRN: [id number] Visit Date: 12/13/2019  Today's healthcare provider: [name], MD   Chief Complaint  Patient presents with  . Hospitalization Follow-up   Subjective    HPI Follow up Hospitalization Patient is status post aortic valve replacement and single-vessel bypass surgery. He is feeling well and trying to start working on his habits.  Patient was admitted to [facility] on 11/26/2019 and discharged on 12/02/2019. He was treated for S/P AVR. Treatment for this included; see notes in chart. Telephone follow up was done on 12/03/2019 He reports good compliance with treatment. He reports this condition is improved.  --------------------------------------------------------------------  Patient states he is feeling much improved since discharge from hospital. Patient states he is feeling some lightheadedness occasionally. Patient states he has began to walk 1 1/2 miles daly.   For his diabetes he had been on [place] but now is on Metformin NovoLog and Levemir.  He checks his blood sugar 3 times a day.      Medications: Outpatient Medications Prior to Visit  Medication Sig  . acetaminophen (TYLENOL) 500 MG tablet Take 1,000 mg by mouth every 6 (six) hours as needed for moderate pain or headache.  . albuterol (PROAIR HFA) 108 (90 Base) MCG/ACT inhaler Inhale 1 puff into the lungs every 6 (six) hours as needed for wheezing or shortness of breath.  . allopurinol (ZYLOPRIM) 300 MG tablet Take 1 tablet (300 mg total) by mouth daily.  Marland Kitchen aspirin EC 325 MG EC tablet Take 1 tablet (325 mg total) by mouth daily.  . Carboxymethylcellulose Sodium (MOISTURIZING  LUBRICANT EYE OP) Place 1 drop into both eyes daily as needed (for dry eyes).  . Cholecalciferol (VITAMIN D) 2000 units CAPS Take 2,000 Units by mouth every evening.   . ezetimibe (ZETIA) 10 MG tablet TAKE 1 TABLET BY MOUTH EVERY DAY (Patient taking differently: Take 10 mg by mouth daily. )  . glucose blood (ONETOUCH VERIO) test strip Use as instructed; BID  . insulin aspart (NOVOLOG) 100 UNIT/ML injection Inject 6 Units into the skin 3 (three) times daily with meals.  . insulin detemir (LEVEMIR) 100 UNIT/ML injection Inject 0.55 mLs (55 Units total) into the skin daily.  . Insulin Syringe 27G X 1/2" 0.5 ML MISC Use as directed  . losartan (COZAAR) 50 MG tablet TAKE 1 TABLET BY MOUTH EVERY DAY (Patient taking differently: Take 50 mg by mouth daily. )  . magnesium oxide (MAG-OX) 400 MG tablet TAKE 1 TABLET BY MOUTH TWICE A DAY (Patient taking differently: Take 400 mg by mouth 2 (two) times daily. )  . metFORMIN (GLUCOPHAGE) 1000 MG tablet Take 1 tablet (1,000 mg total) by mouth 2 (two) times daily with a meal.  . metoprolol tartrate (LOPRESSOR) 25 MG tablet Take 1 tablet (25 mg total) by mouth 2 (two) times daily.  . ONE TOUCH LANCETS MISC 1 Device by Does not apply route 2 (two) times daily.  . rosuvastatin (CRESTOR) 5 MG tablet Take 1 tablet (5 mg total) by mouth daily.  . tamsulosin (FLOMAX) 0.4 MG CAPS capsule TAKE 1 CAPSULE BY MOUTH EVERY DAY  . BD PEN NEEDLE NANO 2ND GEN 32G  X 4 MM MISC USE AS DIRECTED (Patient not taking: Reported on 12/13/2019)  . furosemide (LASIX) 40 MG tablet Take 1 tablet (40 mg total) by mouth daily. (Patient not taking: Reported on 12/13/2019)  . potassium chloride SA (KLOR-CON) 20 MEQ tablet Take 1 tablet (20 mEq total) by mouth daily. (Patient not taking: Reported on 12/13/2019)   No facility-administered medications prior to visit.    Review of Systems  Constitutional: Negative for appetite change, chills and fever.  Eyes: Negative.   Respiratory: Negative for  chest tightness, shortness of breath and wheezing.   Cardiovascular: Negative for chest pain and palpitations.  Gastrointestinal: Negative for abdominal pain, nausea and vomiting.  Endocrine: Negative.   Allergic/Immunologic: Negative.   Neurological: Negative.   Hematological: Negative.   Psychiatric/Behavioral: Negative.     Last hemoglobin A1c Lab Results  Component Value Date   HGBA1C 11.4 (H) 11/22/2019      Objective    BP 129/73 (BP Location: Right Arm, Patient Position: Sitting, Cuff Size: Large)   Pulse 77   Temp 97.7 F (36.5 C) (Other (Comment))   Resp 18   Ht 5\' 11"  (1.803 m)   Wt 259 lb (117.5 kg)   SpO2 96%   BMI 36.12 kg/m  BP Readings from Last 3 Encounters:  12/14/19 120/60  12/13/19 129/73  12/02/19 134/69   Wt Readings from Last 3 Encounters:  12/14/19 257 lb 4 oz (116.7 kg)  12/13/19 259 lb (117.5 kg)  12/02/19 261 lb 6.4 oz (118.6 kg)      Physical Exam Vitals reviewed.  Constitutional:      Appearance: He is well-developed. He is obese.  HENT:     Head: Normocephalic and atraumatic.     Right Ear: External ear normal.     Left Ear: External ear normal.     Nose: Nose normal.  Eyes:     General: No scleral icterus.    Conjunctiva/sclera: Conjunctivae normal.  Neck:     Thyroid: No thyromegaly.  Cardiovascular:     Rate and Rhythm: Normal rate and regular rhythm.     Heart sounds: Normal heart sounds.     Comments: 2/6 murmur loudest at the right upper sternal border Pulmonary:     Effort: Pulmonary effort is normal.     Breath sounds: Normal breath sounds.  Abdominal:     Palpations: Abdomen is soft.  Skin:    General: Skin is warm and dry.     Comments: Atypical nevus of the back.  Neurological:     General: No focal deficit present.     Mental Status: He is alert and oriented to person, place, and time.  Psychiatric:        Mood and Affect: Mood normal.        Behavior: Behavior normal.        Thought Content: Thought  content normal.        Judgment: Judgment normal.       No results found for any visits on 12/13/19.  Assessment & Plan     1. Controlled type 2 diabetes mellitus without complication, without long-term current use of insulin (HCC) Follow-up in 1 month with 3 times daily blood sugars.  Goal is to get back to p.o. medications possibly off insulin as patient is able to lose enough weight. - Lipid panel - Comprehensive Metabolic Panel (CMET) - metFORMIN (GLUCOPHAGE) 1000 MG tablet; Take 1 tablet (1,000 mg total) by mouth 2 (two) times daily with a  meal.  Dispense: 60 tablet; Refill: 1 - insulin aspart (NOVOLOG) 100 UNIT/ML injection; Inject 6 Units into the skin 3 (three) times daily with meals.  Dispense: 10 mL; Refill: 1 - insulin detemir (LEVEMIR) 100 UNIT/ML injection; Inject 0.55 mLs (55 Units total) into the skin daily.  Dispense: 10 mL; Refill: 3  2. Essential hypertension Controlled- Lipid panel - Comprehensive Metabolic Panel (CMET)  3. Mixed hyperlipidemia Take Crestor at nighttime. - Lipid panel - Comprehensive Metabolic Panel (CMET)  4. CAD in native artery  - Lipid panel - Comprehensive Metabolic Panel (CMET)  5. Status post aortic valve replacement   6. Hx of CABG History of single-vessel CABG  7. Morbid obesity ([place]) Diet and exercise stressed.   No follow-ups on file.      I, Jeffery Durie, MD, have reviewed all documentation for this visit. The documentation on 12/17/19 for the exam, diagnosis, procedures, and orders are all accurate and complete.    Trell Secrist Cranford Mon, MD  [facility] [phone] (phone) [phone] (fax)  [place]

## 2019-12-13 ENCOUNTER — Ambulatory Visit (INDEPENDENT_AMBULATORY_CARE_PROVIDER_SITE_OTHER): Payer: No Typology Code available for payment source | Admitting: Family Medicine

## 2019-12-13 ENCOUNTER — Other Ambulatory Visit: Payer: Self-pay

## 2019-12-13 ENCOUNTER — Encounter: Payer: Self-pay | Admitting: Family Medicine

## 2019-12-13 VITALS — BP 129/73 | HR 77 | Temp 97.7°F | Resp 18 | Ht 71.0 in | Wt 259.0 lb

## 2019-12-13 DIAGNOSIS — E119 Type 2 diabetes mellitus without complications: Secondary | ICD-10-CM

## 2019-12-13 DIAGNOSIS — E782 Mixed hyperlipidemia: Secondary | ICD-10-CM

## 2019-12-13 DIAGNOSIS — Z952 Presence of prosthetic heart valve: Secondary | ICD-10-CM

## 2019-12-13 DIAGNOSIS — I2510 Atherosclerotic heart disease of native coronary artery without angina pectoris: Secondary | ICD-10-CM

## 2019-12-13 DIAGNOSIS — Z951 Presence of aortocoronary bypass graft: Secondary | ICD-10-CM

## 2019-12-13 DIAGNOSIS — I10 Essential (primary) hypertension: Secondary | ICD-10-CM

## 2019-12-13 MED ORDER — INSULIN DETEMIR 100 UNIT/ML ~~LOC~~ SOLN: 55.0000 [IU] | Freq: Every day | SUBCUTANEOUS | 3 refills | Status: DC

## 2019-12-13 MED ORDER — INSULIN ASPART 100 UNIT/ML ~~LOC~~ SOLN: 6.0000 [IU] | Freq: Three times a day (TID) | SUBCUTANEOUS | 1 refills | Status: DC

## 2019-12-13 MED ORDER — METFORMIN HCL 1000 MG PO TABS: 1000.0000 mg | ORAL_TABLET | Freq: Two times a day (BID) | ORAL | 1 refills | Status: DC

## 2019-12-13 NOTE — Patient Instructions (Addendum)
Take rosuvastatin at bedtime. Check blood sugar three times daily as needed. Follow up in 1 month.

## 2019-12-14 ENCOUNTER — Encounter: Payer: Self-pay | Admitting: Family

## 2019-12-14 ENCOUNTER — Ambulatory Visit (INDEPENDENT_AMBULATORY_CARE_PROVIDER_SITE_OTHER): Payer: No Typology Code available for payment source | Admitting: Family

## 2019-12-14 ENCOUNTER — Other Ambulatory Visit: Payer: Self-pay

## 2019-12-14 VITALS — BP 120/60 | HR 79 | Ht 71.0 in | Wt 257.2 lb

## 2019-12-14 DIAGNOSIS — I350 Nonrheumatic aortic (valve) stenosis: Secondary | ICD-10-CM

## 2019-12-14 DIAGNOSIS — I25118 Atherosclerotic heart disease of native coronary artery with other forms of angina pectoris: Secondary | ICD-10-CM | POA: Diagnosis not present

## 2019-12-14 DIAGNOSIS — Z952 Presence of prosthetic heart valve: Secondary | ICD-10-CM | POA: Diagnosis not present

## 2019-12-14 DIAGNOSIS — E785 Hyperlipidemia, unspecified: Secondary | ICD-10-CM

## 2019-12-14 DIAGNOSIS — E1165 Type 2 diabetes mellitus with hyperglycemia: Secondary | ICD-10-CM

## 2019-12-14 DIAGNOSIS — I10 Essential (primary) hypertension: Secondary | ICD-10-CM | POA: Diagnosis not present

## 2019-12-14 MED ORDER — METOPROLOL TARTRATE 25 MG PO TABS
25.0000 mg | ORAL_TABLET | Freq: Two times a day (BID) | ORAL | 1 refills | Status: DC
Start: 2019-12-14 — End: 2020-02-13

## 2019-12-14 NOTE — Patient Instructions (Signed)
Medication Instructions:   Your physician recommends that you continue on your current medications as directed. Please refer to the Current Medication list given to you today.   *If you need a refill on your cardiac medications before your next appointment, please call your pharmacy*   Lab Work: None Ordered. If you have labs (blood work) drawn today and your tests are completely normal, you will receive your results only by:  [place] (if you have MyChart) OR  A paper copy in the mail If you have any lab test that is abnormal or we need to change your treatment, we will call you to review the results.   Testing/Procedures: None Ordered.    Follow-Up: At [facility], you and your health needs are our priority.  As part of our continuing mission to provide you with exceptional heart care, we have created designated Provider Care Teams.  These Care Teams include your primary Cardiologist (physician) and Advanced Practice Providers (APPs -  Physician Assistants and Nurse Practitioners) who all work together to provide you with the care you need, when you need it.  We recommend signing up for the patient portal called "MyChart".  Sign up information is provided on this After Visit Summary.  MyChart is used to connect with patients for Virtual Visits (Telemedicine).  Patients are able to view lab/test results, encounter notes, upcoming appointments, etc.  Non-urgent messages can be sent to your provider as well.   To learn more about what you can do with MyChart, go to [email or web address].    Your next appointment:   2 month(s)  The format for your next appointment:   In Person  Provider:    You may see [name], MD or one of the following Advanced Practice Providers on your designated Care Team:     Murray Hodgkins, NP  Christell Faith, PA-C  Marrianne Mood, PA-C  Laurann Montana, NP    Other Instructions N/A

## 2019-12-14 NOTE — Progress Notes (Signed)
Office Visit    Patient Name: Jeffery West Date of Encounter: 12/14/2019  Primary Care Provider:  Jerrol Banana., MD Primary Cardiologist:  [name], MD Electrophysiologist:  None   Chief Complaint    Jeffery West is a 64 y.o. male with a hx of obesity, HTN, HLD, DM2, severe aortic stenosis s/p AVR 11/26/19 and CAD s/p CABG 11/26/19 presents today for follow-up after AVR.  Past Medical History    Past Medical History:  Diagnosis Date  . Coronary artery disease   . Diabetes mellitus without complication ([place])   . Erectile dysfunction   . Gout   . Heart murmur   . Hyperlipidemia   . Hypertension   . Obesity   . Osteoarthritis   . Vitamin D deficiency    Past Surgical History:  Procedure Laterality Date  . AORTIC VALVE REPLACEMENT N/A 11/26/2019   Procedure: AORTIC VALVE REPLACEMENT (AVR) USING INSPIRIS AORTIC VALVE SIZE 23MM;  Surgeon: Gaye Pollack, MD;  Location: [facility];  Service: Open Heart Surgery;  Laterality: N/A;  . CARDIAC CATHETERIZATION    . COLONOSCOPY WITH PROPOFOL N/A 08/24/2019   Procedure: COLONOSCOPY WITH PROPOFOL;  Surgeon: Lucilla Lame, MD;  Location: [facility] ENDOSCOPY;  Service: Endoscopy;  Laterality: N/A;  . CORONARY ARTERY BYPASS GRAFT N/A 11/26/2019   Procedure: CORONARY ARTERY BYPASS GRAFTING (CABG) X Two , using left internal mammary artery and right leg greater saphenous vein harvested endoscopically.;  Surgeon: Gaye Pollack, MD;  Location: [place] OR;  Service: Open Heart Surgery;  Laterality: N/A;  . LEFT HEART CATH AND CORONARY ANGIOGRAPHY Left 10/19/2019   Procedure: LEFT HEART CATH AND CORONARY ANGIOGRAPHY;  Surgeon: Minna Merritts, MD;  Location: [place] CV LAB;  Service: Cardiovascular;  Laterality: Left;  . NO PAST SURGERIES    . RIGHT HEART CATH N/A 10/19/2019   Procedure: RIGHT HEART CATH;  Surgeon: Minna Merritts, MD;  Location: [place] CV LAB;  Service: Cardiovascular;  Laterality: N/A;  . SHOULDER ARTHROSCOPY WITH  OPEN ROTATOR CUFF REPAIR Right 07/26/2017   Procedure: SHOULDER ARTHROSCOPY WITH OPEN ROTATOR CUFF REPAIR,SUBACROMINAL DECOMPRESSION,DISTAL CLAVICLE EXCISION;  Surgeon: Thornton Park, MD;  Location: [facility] ORS;  Service: Orthopedics;  Laterality: Right;  . TEE WITHOUT CARDIOVERSION N/A 11/26/2019   Procedure: TRANSESOPHAGEAL ECHOCARDIOGRAM (TEE);  Surgeon: Gaye Pollack, MD;  Location: [place];  Service: Open Heart Surgery;  Laterality: N/A;    Allergies  Allergies  Allergen Reactions  . Penicillins Hives, Swelling and Other (See Comments)    Has patient had a PCN reaction causing immediate rash, facial/tongue/throat swelling, SOB or lightheadedness with hypotension: Unknown Has patient had a PCN reaction causing severe rash involving mucus membranes or skin necrosis: No Has patient had a PCN reaction that required hospitalization: no Has patient had a PCN reaction occurring within the last 10 years: No If all of the above answers are "NO", then may proceed with Cephalosporin use.     History of Present Illness    Jeffery West is a 64 y.o. male with a hx of obesity, HTN, HLD, DM2, severe aortic stenosis s/p AVR 11/26/19 and CAD s/p CABGx2 11/26/19 last seen 11/06/19 by Dr. Rockey Situ.  Echocardiogram at [facility] June 2017 showed mean aortic valve gradient 25.5 millimercury.  Echo 09/28/2019 showed increase in aortic valve mean gradient of 53.5 mmHg, LVEF 60 to 28%, grade 1 diastolic dysfunction.  Cardiac catheterization 10/19/2019 showed 80% stenosis to mid LAD, 70% first diagonal stenosis.  He  was recommended for coronary bypass graft surgery and bioprosthetic aortic valve replacement due to his young age and poorly controlled diabetes.  Underwent AVR (61mm Edwards Inspiris Resilia pericardial valve) and CABGx2 (LIMA-LAD, SVG-first diagonal) 11/2005/21. He was discharged on a 5 day course of Lasix and Potassium.   Presented with wife.  Reports feeling overall since discharge.  He reports intermittent  lightheadedness or a "woozy "feeling but this will self resolve.  He is monitoring his blood sugar carefully at home with readings routinely 135.  He is walking 3 times per day as much is 1.5 miles without difficulty.  His RLE vein graft site has had some delayed wound healing so he has been washing it carefully with antibacterial soap and cleaning with peroxide per instructions from the cardiothoracic surgery office.  He had his sutures out.   Reports no shortness of breath nor dyspnea on exertion. Reports no chest pain, pressure, or tightness. No edema, orthopnea, PND. Reports no palpitations.    EKGs/Labs/Other Studies Reviewed:   The following studies were reviewed today:  Echo 09/2019 1. Left ventricular ejection fraction, by estimation, is 60 to 65%. The  left ventricle has normal function. The left ventricle has no regional  wall motion abnormalities. Left ventricular diastolic parameters are  consistent with Grade I diastolic  dysfunction (impaired relaxation).  2. Right ventricular systolic function is normal. The right ventricular  size is normal.  3. Left atrial size was mildly dilated.  4. The aortic valve is severely calcified. Severe aortic valve stenosis.  Aortic valve area, by VTI measures 1.10 cm. Aortic valve mean gradient  measures 53.5 mmHg. Aortic valve Vmax measures 4.93 m/s.   Cardiac cath 10/2019 Conclusion   Mid LAD-1 lesion is 40% stenosed.   Mid LAD-2 lesion is 80% stenosed.   1st Diag lesion is 70% stenosed.   The left ventricular systolic function is normal.   LV end diastolic pressure is normal.   The left ventricular ejection fraction is 55-65% by visual estimate.   There is no mitral valve regurgitation.   There is severe aortic valve stenosis.    EKG:  EKG is  ordered today.  The ekg ordered today demonstrates NSR 79 bpm with rightward axis and incomplete RBBB.  Recent Labs: 07/20/2019: TSH 1.970 11/22/2019: ALT 54 11/27/2019: Magnesium  2.2 11/28/2019: BUN 15; Creatinine, Ser 0.87; Hemoglobin 12.2; Platelets 96; Potassium 4.0; Sodium 136  Recent Lipid Panel    Component Value Date/Time   CHOL 227 (H) 07/20/2019 0807   TRIG 414 (H) 07/20/2019 0807   HDL 34 (L) 07/20/2019 0807   CHOLHDL 6.7 (H) 07/20/2019 0807   CHOLHDL 7.4 (H) 05/23/2017 1222   LDLCALC 120 (H) 07/20/2019 0807   LDLCALC  05/23/2017 1222     Comment:     . LDL cholesterol not calculated. Triglyceride levels greater than 400 mg/dL invalidate calculated LDL results. . Reference range: <100 . Desirable range <100 mg/dL for primary prevention;   <70 mg/dL for patients with CHD or diabetic patients  with > or = 2 CHD risk factors. Marland Kitchen LDL-C is now calculated using the Martin-Hopkins  calculation, which is a validated novel method providing  better accuracy than the Friedewald equation in the  estimation of LDL-C.  Cresenciano Genre et al. Annamaria Helling. 6712;458(09): 2061-2068  (http://education.Quest[employer].com/faq/FAQ164)     Home Medications   Current Meds  Medication Sig  . acetaminophen (TYLENOL) 500 MG tablet Take 1,000 mg by mouth every 6 (six) hours as needed for moderate pain  or headache.  . albuterol (PROAIR HFA) 108 (90 Base) MCG/ACT inhaler Inhale 1 puff into the lungs every 6 (six) hours as needed for wheezing or shortness of breath.  . allopurinol (ZYLOPRIM) 300 MG tablet Take 1 tablet (300 mg total) by mouth daily.  Marland Kitchen aspirin EC 325 MG EC tablet Take 1 tablet (325 mg total) by mouth daily.  . Cholecalciferol (VITAMIN D) 2000 units CAPS Take 2,000 Units by mouth every evening.   . ezetimibe (ZETIA) 10 MG tablet Take 10 mg by mouth daily.  Marland Kitchen glucose blood (ONETOUCH VERIO) test strip Use as instructed; BID  . insulin aspart (NOVOLOG) 100 UNIT/ML injection Inject 6 Units into the skin 3 (three) times daily with meals.  . insulin detemir (LEVEMIR) 100 UNIT/ML injection Inject 0.55 mLs (55 Units total) into the skin daily.  . Insulin Syringe 27G X 1/2"  0.5 ML MISC Use as directed  . losartan (COZAAR) 50 MG tablet Take 50 mg by mouth daily.  . magnesium oxide (MAG-OX) 400 MG tablet Take 400 mg by mouth 2 (two) times daily.   . metFORMIN (GLUCOPHAGE) 1000 MG tablet Take 1 tablet (1,000 mg total) by mouth 2 (two) times daily with a meal.  . metoprolol tartrate (LOPRESSOR) 25 MG tablet Take 1 tablet (25 mg total) by mouth 2 (two) times daily.  . ONE TOUCH LANCETS MISC 1 Device by Does not apply route 2 (two) times daily.  . rosuvastatin (CRESTOR) 5 MG tablet Take 1 tablet (5 mg total) by mouth daily.  . tamsulosin (FLOMAX) 0.4 MG CAPS capsule TAKE 1 CAPSULE BY MOUTH EVERY DAY  . [DISCONTINUED] ezetimibe (ZETIA) 10 MG tablet Take 10 mg by mouth daily.  . [DISCONTINUED] losartan (COZAAR) 50 MG tablet Take 50 mg by mouth daily.  . [DISCONTINUED] magnesium oxide (MAG-OX) 400 MG tablet Take 400 mg by mouth 2 (two) times daily.  . [DISCONTINUED] metoprolol tartrate (LOPRESSOR) 25 MG tablet Take 1 tablet (25 mg total) by mouth 2 (two) times daily.    Review of Systems    Review of Systems  Constitutional: Negative for chills, fever and malaise/fatigue.  Cardiovascular: Negative for chest pain, dyspnea on exertion, leg swelling, near-syncope, orthopnea, palpitations and syncope.  Respiratory: Negative for cough, shortness of breath and wheezing.   Gastrointestinal: Negative for nausea and vomiting.  Neurological: Negative for dizziness, light-headedness and weakness.   All other systems reviewed and are otherwise negative except as noted above.  Physical Exam    VS:  BP 120/60 (BP Location: Left Arm, Patient Position: Sitting, Cuff Size: Normal)   Pulse 79   Ht 5\' 11"  (1.803 m)   Wt 257 lb 4 oz (116.7 kg)   SpO2 95%   BMI 35.88 kg/m  , BMI Body mass index is 35.88 kg/m. GEN: Well nourished, well developed, in no acute distress. HEENT: normal. Neck: Supple, no JVD, carotid bruits, or masses. Cardiac: RRR, no murmurs, rubs, or gallops. No  clubbing, cyanosis, edema.  Radials/DP/PT 2+ and equal bilaterally.  Respiratory:  Respirations regular and unlabored, clear to auscultation bilaterally. GI: Soft, nontender, nondistended, BS + x 4. MS: No deformity or atrophy. Skin: Warm and dry, no rash.  Midsternal incision clean, dry, intact.  Chest tube sutures have all been removed and the wound edges are approximated.  No signs or symptoms of infection, erythema.  RLE vein graft site with mild erythema, no signs or symptoms of infection. Neuro:  Strength and sensation are intact. Psych: Normal affect.   Assessment &  Plan    1. CAD s/p CABG X2 -surgical site healing appropriately.  Reports no anginal symptoms.  He plans to participate in cardiac rehab.  He has been walking 3 times per day at home up to 1.5 miles.  He endorses eating a low-sodium, heart healthy diet.    GDMT includes aspirin, beta-blocker, statin.    He is presently on full-strength aspirin post CABG and AVR and will discuss with Dr. Cyndia Bent when to reduce to 81 mg.  2. Severe aortic stenosis s/p bioprosthetic aortic valve - Surgical site healing appropriately.  No chest pain, pressure, tightness.  No lightheadedness, dizziness, shortness of breath.  Plans to participate in cardiac rehab.  Continue optimal BP and heart rate control.  3. DM2 - 11/22/19 A1c 11.4.  Hospitalized he was switched from [place] to insulin.  He is very hopeful to be able to return the [place].  Monitoring blood sugar carefully at home, adhering to dietary recommendations.  He and his wife did note some difficulties with getting syringes, test as they are purchasing them over-the-counter.  Encouraged to discuss with primary care provider sending in prescriptions for these things of that should be covered by insurance.  I will additionally write a note to Dr. Rosanna Randy.  4. HTN - BP well controlled.  Continue with antihypertensive regimen.  BP goal less than 130/80.  5. HLD, LDL goal less than 70-  07/20/19 total cholesterol 227, HDL 34, triglycerides 414, LDL 120.  Continue Zetia 10 mg a day, Crestor 5 mg daily.  He has upcoming lipid panel with his PCP.  If LDL remains above goal of 70 consider increased dose of Crestor to 10 mg versus addition of PCSK9i or Nexlizet.  Disposition: His primary care ordered labs including CMP, lipid profile which he plans to have completed early next week.  Follow up in 2 month(s) with Dr. Rockey Situ or APP   Loel Dubonnet, NP 12/14/2019, 1:27 PM

## 2019-12-17 ENCOUNTER — Telehealth: Payer: Self-pay | Admitting: Family Medicine

## 2019-12-17 NOTE — Telephone Encounter (Signed)
Please review. Thanks!  

## 2019-12-17 NOTE — Telephone Encounter (Signed)
Pt saw dr Rosanna Randy on 12-14-2019 and in the past dr Rosanna Randy offered to give him some medication for yeast infection to use in private area.Pt is dm. Pt was using some OTC and now would like to take dr Rosanna Randy up on the offer to get rx for yeast infection. Pt forgot to mention to dr Rosanna Randy before he left the building on Thursday. [facility] [address]

## 2019-12-18 NOTE — Telephone Encounter (Signed)
Patient called back a 2nd time.  Patient is having an issue with a possible yeast infection from his insulin medication. Patient forgot to mention it at the last appointment with Dr. Rosanna Randy. Needing something as soon as possible.  Please call patient back to let him know what has been called in.  Thanks, [name]

## 2019-12-18 NOTE — Telephone Encounter (Signed)
Patient stopped by the office asking for this medication again.  Please advise if he needs OV to see this or let patient know when it is called in.

## 2019-12-19 ENCOUNTER — Other Ambulatory Visit: Payer: Self-pay | Admitting: Family Medicine

## 2019-12-19 DIAGNOSIS — E119 Type 2 diabetes mellitus without complications: Secondary | ICD-10-CM

## 2019-12-19 LAB — COMPREHENSIVE METABOLIC PANEL
ALT: 34 IU/L (ref 0–44)
AST: 35 IU/L (ref 0–40)
Albumin/Globulin Ratio: 1.5 (ref 1.2–2.2)
Albumin: 4 g/dL (ref 3.8–4.8)
Alkaline Phosphatase: 102 IU/L (ref 48–121)
BUN/Creatinine Ratio: 13 (ref 10–24)
BUN: 13 mg/dL (ref 8–27)
Bilirubin Total: 0.3 mg/dL (ref 0.0–1.2)
CO2: 21 mmol/L (ref 20–29)
Calcium: 9.7 mg/dL (ref 8.6–10.2)
Chloride: 104 mmol/L (ref 96–106)
Creatinine, Ser: 0.97 mg/dL (ref 0.76–1.27)
GFR calc Af Amer: 96 mL/min/{1.73_m2} (ref >59)
GFR calc non Af Amer: 83 mL/min/{1.73_m2} (ref >59)
Globulin, Total: 2.6 g/dL (ref 1.5–4.5)
Glucose: 180 mg/dL — ABNORMAL HIGH (ref 65–99)
Potassium: 4.6 mmol/L (ref 3.5–5.2)
Sodium: 140 mmol/L (ref 134–144)
Total Protein: 6.6 g/dL (ref 6.0–8.5)

## 2019-12-19 LAB — LIPID PANEL
Chol/HDL Ratio: 3.1 ratio (ref 0.0–5.0)
Cholesterol, Total: 108 mg/dL (ref 100–199)
HDL: 35 mg/dL — ABNORMAL LOW (ref >39)
LDL Chol Calc (NIH): 53 mg/dL (ref 0–99)
Triglycerides: 110 mg/dL (ref 0–149)
VLDL Cholesterol Cal: 20 mg/dL (ref 5–40)

## 2019-12-19 MED ORDER — NYSTATIN 100000 UNIT/GM EX CREA: 1.0000 "application " | TOPICAL_CREAM | Freq: Two times a day (BID) | CUTANEOUS | 6 refills | Status: DC

## 2019-12-19 NOTE — Telephone Encounter (Signed)
[facility]ystatin sent in to [facility] and patient advised.

## 2019-12-19 NOTE — Telephone Encounter (Signed)
Needs BD insulin syryinges with BD ultrafine - 1ML -12.7 mm -30 gauge (Novolog not levomir) Needs One Touch Verio test strips and Delica lancets  Needs Rx for all of these to receive them half price. He has met his insurance deductible  [id number] [address]  [id number] Phone: [phone]: [phone]

## 2019-12-24 ENCOUNTER — Other Ambulatory Visit: Payer: Self-pay | Admitting: Surgical

## 2019-12-24 DIAGNOSIS — E119 Type 2 diabetes mellitus without complications: Secondary | ICD-10-CM

## 2019-12-25 ENCOUNTER — Other Ambulatory Visit: Payer: Self-pay | Admitting: Surgery

## 2019-12-25 DIAGNOSIS — Z952 Presence of prosthetic heart valve: Secondary | ICD-10-CM

## 2019-12-25 MED ORDER — ONETOUCH VERIO VI STRP: ORAL_STRIP | 12 refills | Status: AC

## 2019-12-25 MED ORDER — ONETOUCH DELICA LANCETS 30G MISC: 12 refills | Status: DC

## 2019-12-25 MED ORDER — "INSULIN SYRINGE-NEEDLE U-100 30G X 1/2"" 1 ML MISC": 3 refills | Status: DC

## 2019-12-25 NOTE — Addendum Note (Signed)
Addended by: Shawna [place] on: 12/25/2019 03:17 PM   Modules accepted: Orders

## 2019-12-26 ENCOUNTER — Ambulatory Visit
Admission: RE | Admit: 2019-12-26 | Discharge: 2019-12-26 | Disposition: A | Discharge status: Home / Self Care | Payer: No Typology Code available for payment source | Source: Ambulatory Visit | Attending: Surgery | Admitting: Surgery

## 2019-12-26 ENCOUNTER — Other Ambulatory Visit: Payer: Self-pay

## 2019-12-26 ENCOUNTER — Encounter: Payer: Self-pay | Admitting: Surgery

## 2019-12-26 ENCOUNTER — Ambulatory Visit (INDEPENDENT_AMBULATORY_CARE_PROVIDER_SITE_OTHER): Payer: Self-pay | Admitting: Surgery

## 2019-12-26 VITALS — BP 134/75 | HR 73 | Temp 97.5°F | Resp 20 | Ht 71.0 in | Wt 254.0 lb

## 2019-12-26 DIAGNOSIS — Z952 Presence of prosthetic heart valve: Secondary | ICD-10-CM

## 2019-12-26 DIAGNOSIS — I2510 Atherosclerotic heart disease of native coronary artery without angina pectoris: Secondary | ICD-10-CM

## 2019-12-26 DIAGNOSIS — I350 Nonrheumatic aortic (valve) stenosis: Secondary | ICD-10-CM

## 2019-12-26 NOTE — Progress Notes (Signed)
HPI: Patient returns for routine postoperative follow-up having undergone coronary bypass graft surgery x2 and aortic valve replacement using a 23 mm pericardial valve on 11/26/2019. The patient's early postoperative recovery while in the hospital was notable for an uncomplicated postoperative course. Since hospital discharge the patient reports that he has been feeling well.  He is walking about 20 minutes twice daily without chest pain or shortness of breath.  He has been watching his diet closely and following his blood sugar at home.  He has been keeping a chart.  The low blood sugar has been around 130 in the morning and he is usually about 200 at lunchtime which is the highest.  He has lost about 20 pounds since discharge.  Current Outpatient Medications  Medication Sig Dispense Refill  . acetaminophen (TYLENOL) 500 MG tablet Take 1,000 mg by mouth every 6 (six) hours as needed for moderate pain or headache.    . albuterol (PROAIR HFA) 108 (90 Base) MCG/ACT inhaler Inhale 1 puff into the lungs every 6 (six) hours as needed for wheezing or shortness of breath. 18 g 3  . allopurinol (ZYLOPRIM) 300 MG tablet Take 1 tablet (300 mg total) by mouth daily. 90 tablet 3  . aspirin EC 325 MG EC tablet Take 1 tablet (325 mg total) by mouth daily.    . BD PEN NEEDLE NANO 2ND GEN 32G X 4 MM MISC USE AS DIRECTED 100 each 8  . Cholecalciferol (VITAMIN D) 2000 units CAPS Take 2,000 Units by mouth every evening.     . ezetimibe (ZETIA) 10 MG tablet TAKE 1 TABLET BY MOUTH EVERY DAY 90 tablet 3  . ezetimibe (ZETIA) 10 MG tablet Take 10 mg by mouth daily.    Marland Kitchen glucose blood (ONETOUCH VERIO) test strip Use as instructed; BID 100 each 12  . insulin aspart (NOVOLOG) 100 UNIT/ML injection Inject 6 Units into the skin 3 (three) times daily with meals. 10 mL 1  . insulin detemir (LEVEMIR) 100 UNIT/ML injection Inject 0.55 mLs (55 Units total) into the skin daily. 10 mL 3  . Insulin Syringe-Needle U-100 30G X 1/2"  1 ML MISC Use with insulin 3 times daily 270 each 3  . losartan (COZAAR) 50 MG tablet TAKE 1 TABLET BY MOUTH EVERY DAY (Patient taking differently: Take 50 mg by mouth daily. ) 90 tablet 2  . losartan (COZAAR) 50 MG tablet Take 50 mg by mouth daily.    . magnesium oxide (MAG-OX) 400 MG tablet TAKE 1 TABLET BY MOUTH TWICE A DAY (Patient taking differently: Take 400 mg by mouth 2 (two) times daily. ) 180 tablet 3  . magnesium oxide (MAG-OX) 400 MG tablet Take 400 mg by mouth 2 (two) times daily.     . metFORMIN (GLUCOPHAGE) 1000 MG tablet Take 1 tablet (1,000 mg total) by mouth 2 (two) times daily with a meal. 60 tablet 1  . metoprolol tartrate (LOPRESSOR) 25 MG tablet Take 1 tablet (25 mg total) by mouth 2 (two) times daily. 90 tablet 1  . nystatin cream (MYCOSTATIN) Apply 1 application topically 2 (two) times daily. 30 g 6  . OneTouch Delica Lancets 48J MISC Use 3 times a day 100 each 12  . rosuvastatin (CRESTOR) 5 MG tablet Take 1 tablet (5 mg total) by mouth daily. 90 tablet 3  . tamsulosin (FLOMAX) 0.4 MG CAPS capsule TAKE 1 CAPSULE BY MOUTH EVERY DAY 90 capsule 3   No current facility-administered medications for this visit.  Physical Exam: BP 134/75 (BP Location: Left Arm, Patient Position: Sitting, Cuff Size: Normal)   Pulse 73   Temp (!) 97.5 F (36.4 C) (Temporal)   Resp 20   Ht 5\' 11"  (1.803 m)   Wt 254 lb (115.2 kg)   SpO2 94% Comment: RA  BMI 35.43 kg/m  He looks well. Cardiac exam shows regular rate and rhythm with normal heart sounds.  There is no murmur. Lungs are clear. Chest incision is healing well and the sternum is stable. His right leg incision is healing well.  The vein harvest tunnel in the thigh is firm but there is no warmth or erythema.  I think this is just fluid in the vein harvest tunnel.  Diagnostic Tests:  CLINICAL DATA:  CABG and aortic valve replacement on 11/26/2019. Routine follow-up.  EXAM: CHEST - 2 VIEW  COMPARISON:  11/28/2019 and  earlier.  FINDINGS: Sternotomy for CABG and aortic valve replacement. Cardiomediastinal silhouette unremarkable and unchanged. Lungs clear. Bronchovascular markings normal. Pulmonary vascularity normal. No visible pleural effusions. No pneumothorax. Degenerative changes and DISH involving the thoracic spine.  IMPRESSION: No acute cardiopulmonary disease.   Electronically Signed   By: Evangeline Dakin M.D.   On: 12/26/2019 10:43   Impression:  Overall I think she is doing well 1 month out from surgery.  I told him he can return to driving a car but should refrain from lifting anything heavier than 10 pounds for 3 months postoperatively.  I told him he get back on his riding lawnmower if he is careful.  I encouraged him to continue walking as much as possible and to watch his diet closely.  I think management of his diabetes is going to be extremely important long-term.  Hopefully he can continue to lose weight with dietary modification and exercise.  He is planning to participate in cardiac rehab.  Plan:  He will continue to follow-up with Dr. Rosanna Randy and Dr. Rockey Situ and will return to see me if he has any problems with his incisions.   Gaye Pollack, MD [facility] [phone]

## 2019-12-31 ENCOUNTER — Encounter: Payer: No Typology Code available for payment source | Attending: Cardiovascular Disease

## 2019-12-31 ENCOUNTER — Other Ambulatory Visit: Payer: Self-pay

## 2019-12-31 ENCOUNTER — Ambulatory Visit: Payer: 59 | Admitting: Cardiovascular Disease

## 2019-12-31 DIAGNOSIS — Z79899 Other long term (current) drug therapy: Secondary | ICD-10-CM | POA: Insufficient documentation

## 2019-12-31 DIAGNOSIS — M1990 Unspecified osteoarthritis, unspecified site: Secondary | ICD-10-CM | POA: Insufficient documentation

## 2019-12-31 DIAGNOSIS — Z6835 Body mass index (BMI) 35.0-35.9, adult: Secondary | ICD-10-CM | POA: Insufficient documentation

## 2019-12-31 DIAGNOSIS — R011 Cardiac murmur, unspecified: Secondary | ICD-10-CM | POA: Insufficient documentation

## 2019-12-31 DIAGNOSIS — Z794 Long term (current) use of insulin: Secondary | ICD-10-CM | POA: Insufficient documentation

## 2019-12-31 DIAGNOSIS — E559 Vitamin D deficiency, unspecified: Secondary | ICD-10-CM | POA: Insufficient documentation

## 2019-12-31 DIAGNOSIS — E669 Obesity, unspecified: Secondary | ICD-10-CM | POA: Insufficient documentation

## 2019-12-31 DIAGNOSIS — I2510 Atherosclerotic heart disease of native coronary artery without angina pectoris: Secondary | ICD-10-CM | POA: Insufficient documentation

## 2019-12-31 DIAGNOSIS — I10 Essential (primary) hypertension: Secondary | ICD-10-CM | POA: Insufficient documentation

## 2019-12-31 DIAGNOSIS — M109 Gout, unspecified: Secondary | ICD-10-CM | POA: Insufficient documentation

## 2019-12-31 DIAGNOSIS — Z951 Presence of aortocoronary bypass graft: Secondary | ICD-10-CM | POA: Insufficient documentation

## 2019-12-31 DIAGNOSIS — E785 Hyperlipidemia, unspecified: Secondary | ICD-10-CM | POA: Insufficient documentation

## 2019-12-31 NOTE — Progress Notes (Signed)
Virtual Visit completed. Patient informed on EP and RD appointment and 6 Minute walk test. Patient also informed of patient health questionnaires on My Chart. Patient Verbalizes understanding. Visit diagnosis can be found in [facility] 11/26/2019. °

## 2020-01-03 ENCOUNTER — Other Ambulatory Visit: Payer: Self-pay

## 2020-01-03 VITALS — Ht 70.75 in | Wt 253.7 lb

## 2020-01-03 DIAGNOSIS — M109 Gout, unspecified: Secondary | ICD-10-CM | POA: Diagnosis not present

## 2020-01-03 DIAGNOSIS — E559 Vitamin D deficiency, unspecified: Secondary | ICD-10-CM | POA: Diagnosis not present

## 2020-01-03 DIAGNOSIS — Z951 Presence of aortocoronary bypass graft: Secondary | ICD-10-CM | POA: Diagnosis present

## 2020-01-03 DIAGNOSIS — I10 Essential (primary) hypertension: Secondary | ICD-10-CM | POA: Diagnosis not present

## 2020-01-03 DIAGNOSIS — E669 Obesity, unspecified: Secondary | ICD-10-CM | POA: Diagnosis not present

## 2020-01-03 DIAGNOSIS — Z6835 Body mass index (BMI) 35.0-35.9, adult: Secondary | ICD-10-CM | POA: Diagnosis not present

## 2020-01-03 DIAGNOSIS — R011 Cardiac murmur, unspecified: Secondary | ICD-10-CM | POA: Diagnosis not present

## 2020-01-03 DIAGNOSIS — I2510 Atherosclerotic heart disease of native coronary artery without angina pectoris: Secondary | ICD-10-CM | POA: Diagnosis not present

## 2020-01-03 DIAGNOSIS — Z794 Long term (current) use of insulin: Secondary | ICD-10-CM | POA: Diagnosis not present

## 2020-01-03 DIAGNOSIS — M1990 Unspecified osteoarthritis, unspecified site: Secondary | ICD-10-CM | POA: Diagnosis not present

## 2020-01-03 DIAGNOSIS — E785 Hyperlipidemia, unspecified: Secondary | ICD-10-CM | POA: Diagnosis not present

## 2020-01-03 DIAGNOSIS — Z79899 Other long term (current) drug therapy: Secondary | ICD-10-CM | POA: Diagnosis not present

## 2020-01-03 NOTE — Progress Notes (Signed)
Cardiac Individual Treatment Plan  Patient Details  Name: Jeffery West MRN: [id number] Date of Birth: 1955-10-17 Referring Provider:     Cardiac Rehab from 01/03/2020 in [facility] Cardiac and Pulmonary Rehab  Referring Provider [name], MD      Initial Encounter Date:    Cardiac Rehab from 01/03/2020 in [facility] Cardiac and Pulmonary Rehab  Date 01/03/20      Visit Diagnosis: S/P CABG x 2  Patient's Home Medications on Admission:  Current Outpatient Medications:  .  acetaminophen (TYLENOL) 500 MG tablet, Take 1,000 mg by mouth every 6 (six) hours as needed for moderate pain or headache., Disp: , Rfl:  .  albuterol (PROAIR HFA) 108 (90 Base) MCG/ACT inhaler, Inhale 1 puff into the lungs every 6 (six) hours as needed for wheezing or shortness of breath., Disp: 18 g, Rfl: 3 .  allopurinol (ZYLOPRIM) 300 MG tablet, Take 1 tablet (300 mg total) by mouth daily., Disp: 90 tablet, Rfl: 3 .  aspirin EC 325 MG EC tablet, Take 1 tablet (325 mg total) by mouth daily., Disp: , Rfl:  .  BD PEN NEEDLE NANO 2ND GEN 32G X 4 MM MISC, USE AS DIRECTED (Patient not taking: Reported on 12/31/2019), Disp: 100 each, Rfl: 8 .  Cholecalciferol (VITAMIN D) 2000 units CAPS, Take 2,000 Units by mouth every evening. , Disp: , Rfl:  .  ezetimibe (ZETIA) 10 MG tablet, TAKE 1 TABLET BY MOUTH EVERY DAY, Disp: 90 tablet, Rfl: 3 .  ezetimibe (ZETIA) 10 MG tablet, Take 10 mg by mouth daily., Disp: , Rfl:  .  glucose blood (ONETOUCH VERIO) test strip, Use as instructed; BID, Disp: 100 each, Rfl: 12 .  insulin aspart (NOVOLOG) 100 UNIT/ML injection, Inject 6 Units into the skin 3 (three) times daily with meals., Disp: 10 mL, Rfl: 1 .  insulin detemir (LEVEMIR) 100 UNIT/ML injection, Inject 0.55 mLs (55 Units total) into the skin daily., Disp: 10 mL, Rfl: 3 .  Insulin Syringe-Needle U-100 30G X 1/2" 1 ML MISC, Use with insulin 3 times daily, Disp: 270 each, Rfl: 3 .  losartan (COZAAR) 50 MG tablet, TAKE 1 TABLET BY MOUTH EVERY  DAY (Patient taking differently: Take 50 mg by mouth daily. ), Disp: 90 tablet, Rfl: 2 .  losartan (COZAAR) 50 MG tablet, Take 50 mg by mouth daily., Disp: , Rfl:  .  magnesium oxide (MAG-OX) 400 MG tablet, TAKE 1 TABLET BY MOUTH TWICE A DAY (Patient taking differently: Take 400 mg by mouth 2 (two) times daily. ), Disp: 180 tablet, Rfl: 3 .  magnesium oxide (MAG-OX) 400 MG tablet, Take 400 mg by mouth 2 (two) times daily. , Disp: , Rfl:  .  metFORMIN (GLUCOPHAGE) 1000 MG tablet, Take 1 tablet (1,000 mg total) by mouth 2 (two) times daily with a meal., Disp: 60 tablet, Rfl: 1 .  metoprolol tartrate (LOPRESSOR) 25 MG tablet, Take 1 tablet (25 mg total) by mouth 2 (two) times daily., Disp: 90 tablet, Rfl: 1 .  nystatin cream (MYCOSTATIN), Apply 1 application topically 2 (two) times daily., Disp: 30 g, Rfl: 6 .  OneTouch Delica Lancets 51V MISC, Use 3 times a day, Disp: 100 each, Rfl: 12 .  rosuvastatin (CRESTOR) 5 MG tablet, Take 1 tablet (5 mg total) by mouth daily., Disp: 90 tablet, Rfl: 3 .  tamsulosin (FLOMAX) 0.4 MG CAPS capsule, TAKE 1 CAPSULE BY MOUTH EVERY DAY, Disp: 90 capsule, Rfl: 3  Past Medical History: Past Medical History:  Diagnosis Date  .  Coronary artery disease   . Diabetes mellitus without complication ([place])   . Erectile dysfunction   . Gout   . Heart murmur   . Hyperlipidemia   . Hypertension   . Obesity   . Osteoarthritis   . Vitamin D deficiency     Tobacco Use: Social History   Tobacco Use  Smoking Status Never Smoker  Smokeless Tobacco Never Used    Labs: Recent Review Flowsheet Data    Labs for ITP Cardiac and Pulmonary Rehab Latest Ref Rng & Units 11/26/2019 11/26/2019 11/27/2019 11/27/2019 12/18/2019   Cholestrol 100 - 199 mg/dL - - - - 108   LDLCALC 0 - 99 mg/dL - - - - 53   HDL >39 mg/dL - - - - 35(L)   Trlycerides 0 - 149 mg/dL - - - - 110   Hemoglobin A1c 4.8 - 5.6 % - - - - -   PHART 7.35 - 7.45 7.242(L) 7.309(L) 7.293(L) 7.289(L) -   PCO2ART 32 - 48  mmHg 55.1(H) 50.7(H) 49.7(H) 54.8(H) -   HCO3 20.0 - 28.0 mmol/L 23.9 25.5 24.1 26.2 -   TCO2 22 - 32 mmol/L '26 27 26 28 ' -   ACIDBASEDEF 0.0 - 2.0 mmol/L 4.0(H) 1.0 3.0(H) 1.0 -   O2SAT % 99.0 99.0 98.0 97.0 -       Exercise Target Goals: Exercise Program Goal: Individual exercise prescription set using results from initial 6 min walk test and THRR while considering  patient's activity barriers and safety.   Exercise Prescription Goal: Initial exercise prescription builds to 30-45 minutes a day of aerobic activity, 2-3 days per week.  Home exercise guidelines will be given to patient during program as part of exercise prescription that the participant will acknowledge.   Education: Aerobic Exercise & Resistance Training: - Gives group verbal and written instruction on the various components of exercise. Focuses on aerobic and resistive training programs and the benefits of this training and how to safely progress through these programs..   Education: Exercise & Equipment Safety: - Individual verbal instruction and demonstration of equipment use and safety with use of the equipment.   Cardiac Rehab from 12/31/2019 in [facility] Cardiac and Pulmonary Rehab  Date 12/31/19  Educator [facility]  Instruction Review Code 1- Verbalizes Understanding      Education: Exercise Physiology & General Exercise Guidelines: - Group verbal and written instruction with models to review the exercise physiology of the cardiovascular system and associated critical values. Provides general exercise guidelines with specific guidelines to those with heart or lung disease.    Education: Flexibility, Balance, Mind/Body Relaxation: Provides group verbal/written instruction on the benefits of flexibility and balance training, including mind/body exercise modes such as yoga, pilates and tai chi.  Demonstration and skill practice provided.   Activity Barriers & Risk Stratification:   6 Minute Walk:  6 Minute Walk     Row Name 01/03/20 1315         6 Minute Walk   Phase Initial     Distance 1175 feet     Walk Time 6 minutes     # of Rest Breaks 0     MPH 2.22     METS 2.69     RPE 13     Perceived Dyspnea  0     VO2 Peak 9.42     Symptoms No     Resting HR 67 bpm     Resting BP 128/66     Resting Oxygen Saturation  96 %     Exercise Oxygen Saturation  during 6 min walk 95 %     Max Ex. HR 91 bpm     Max Ex. BP 150/68     2 Minute Post BP 122/64            Oxygen Initial Assessment:   Oxygen Re-Evaluation:   Oxygen Discharge (Final Oxygen Re-Evaluation):   Initial Exercise Prescription:  Initial Exercise Prescription - 01/03/20 1300      Date of Initial Exercise RX and Referring Provider   Date 01/03/20    Referring Provider [name], MD      Treadmill   MPH 1.9    Grade 0.5    Minutes 15    METs 2.59      REL-XR   Level 2    Speed 50    Minutes 15    METs 2.6      T5 Nustep   Level 2    SPM 80    Minutes 15    METs 2.6      Prescription Details   Frequency (times per week) 2    Duration Progress to 30 minutes of continuous aerobic without signs/symptoms of physical distress      Intensity   THRR 40-80% of Max Heartrate 103-139    Ratings of Perceived Exertion 11-13    Perceived Dyspnea 0-4      Progression   Progression Continue to progress workloads to maintain intensity without signs/symptoms of physical distress.      Resistance Training   Training Prescription Yes    Weight 3 lb    Reps 10-15           Perform Capillary Blood Glucose checks as needed.  Exercise Prescription Changes:  Exercise Prescription Changes    Row Name 01/03/20 1300             Response to Exercise   Blood Pressure (Admit) 128/66       Blood Pressure (Exercise) 150/68       Blood Pressure (Exit) 122/64       Heart Rate (Admit) 67 bpm       Heart Rate (Exercise) 91 bpm       Heart Rate (Exit) 67 bpm       Oxygen Saturation (Admit) 96 %       Oxygen  Saturation (Exercise) 95 %       Oxygen Saturation (Exit) 97 %       Rating of Perceived Exertion (Exercise) 13       Perceived Dyspnea (Exercise) 0       Symptoms None       Comments Walk Test Results         Interval Training   Interval Training No              Exercise Comments:   Exercise Goals and Review:  Exercise Goals    Row Name 01/03/20 1044             Exercise Goals   Increase Physical Activity Yes       Intervention Provide advice, education, support and counseling about physical activity/exercise needs.;Develop an individualized exercise prescription for aerobic and resistive training based on initial evaluation findings, risk stratification, comorbidities and participant's personal goals.       Expected Outcomes Short Term: Attend rehab on a regular basis to increase amount of physical activity.;Long Term: Add in home exercise to make exercise part of routine and  to increase amount of physical activity.;Long Term: Exercising regularly at least 3-5 days a week.       Increase Strength and Stamina Yes       Intervention Provide advice, education, support and counseling about physical activity/exercise needs.;Develop an individualized exercise prescription for aerobic and resistive training based on initial evaluation findings, risk stratification, comorbidities and participant's personal goals.       Expected Outcomes Short Term: Increase workloads from initial exercise prescription for resistance, speed, and METs.;Short Term: Perform resistance training exercises routinely during rehab and add in resistance training at home;Long Term: Improve cardiorespiratory fitness, muscular endurance and strength as measured by increased METs and functional capacity (6MWT)       Able to understand and use rate of perceived exertion (RPE) scale Yes       Intervention Provide education and explanation on how to use RPE scale       Expected Outcomes Short Term: Able to use RPE daily  in rehab to express subjective intensity level;Long Term:  Able to use RPE to guide intensity level when exercising independently       Able to understand and use Dyspnea scale Yes       Intervention Provide education and explanation on how to use Dyspnea scale       Expected Outcomes Short Term: Able to use Dyspnea scale daily in rehab to express subjective sense of shortness of breath during exertion;Long Term: Able to use Dyspnea scale to guide intensity level when exercising independently       Knowledge and understanding of Target Heart Rate Range (THRR) Yes       Intervention Provide education and explanation of THRR including how the numbers were predicted and where they are located for reference       Expected Outcomes Short Term: Able to state/look up THRR;Short Term: Able to use daily as guideline for intensity in rehab;Long Term: Able to use THRR to govern intensity when exercising independently       Able to check pulse independently Yes       Intervention Provide education and demonstration on how to check pulse in carotid and radial arteries.;Review the importance of being able to check your own pulse for safety during independent exercise       Expected Outcomes Short Term: Able to explain why pulse checking is important during independent exercise;Long Term: Able to check pulse independently and accurately       Understanding of Exercise Prescription Yes       Intervention Provide education, explanation, and written materials on patient's individual exercise prescription       Expected Outcomes Short Term: Able to explain program exercise prescription;Long Term: Able to explain home exercise prescription to exercise independently              Exercise Goals Re-Evaluation :   Discharge Exercise Prescription (Final Exercise Prescription Changes):  Exercise Prescription Changes - 01/03/20 1300      Response to Exercise   Blood Pressure (Admit) 128/66    Blood Pressure  (Exercise) 150/68    Blood Pressure (Exit) 122/64    Heart Rate (Admit) 67 bpm    Heart Rate (Exercise) 91 bpm    Heart Rate (Exit) 67 bpm    Oxygen Saturation (Admit) 96 %    Oxygen Saturation (Exercise) 95 %    Oxygen Saturation (Exit) 97 %    Rating of Perceived Exertion (Exercise) 13    Perceived Dyspnea (Exercise) 0  Symptoms None    Comments Walk Test Results      Interval Training   Interval Training No           Nutrition:  Target Goals: Understanding of nutrition guidelines, daily intake of sodium <1561m, cholesterol <2061m calories 30% from fat and 7% or less from saturated fats, daily to have 5 or more servings of fruits and vegetables.  Education: Controlling Sodium/Reading Food Labels -Group verbal and written material supporting the discussion of sodium use in heart healthy nutrition. Review and explanation with models, verbal and written materials for utilization of the food label.   Education: General Nutrition Guidelines/Fats and Fiber: -Group instruction provided by verbal, written material, models and posters to present the general guidelines for heart healthy nutrition. Gives an explanation and review of dietary fats and fiber.   Biometrics:  Pre Biometrics - 01/03/20 1027      Pre Biometrics   Height 5' 10.75" (1.797 m)    Weight 253 lb 11.2 oz (115.1 kg)    BMI (Calculated) 35.64    Single Leg Stand 2.44 seconds            Nutrition Therapy Plan and Nutrition Goals:   Nutrition Assessments:  Nutrition Assessments - 01/03/20 1039      MEDFICTS Scores   Pre Score 39           MEDIFICTS Score Key:          ?70 Need to make dietary changes          40-70 Heart Healthy Diet         ? 40 Therapeutic Level Cholesterol Diet  Nutrition Goals Re-Evaluation:   Nutrition Goals Discharge (Final Nutrition Goals Re-Evaluation):   Psychosocial: Target Goals: Acknowledge presence or absence of significant depression and/or stress,  maximize coping skills, provide positive support system. Participant is able to verbalize types and ability to use techniques and skills needed for reducing stress and depression.   Education: Depression - Provides group verbal and written instruction on the correlation between heart/lung disease and depressed mood, treatment options, and the stigmas associated with seeking treatment.   Education: Sleep Hygiene -Provides group verbal and written instruction about how sleep can affect your health.  Define sleep hygiene, discuss sleep cycles and impact of sleep habits. Review good sleep hygiene tips.     Education: Stress and Anxiety: - Provides group verbal and written instruction about the health risks of elevated stress and causes of high stress.  Discuss the correlation between heart/lung disease and anxiety and treatment options. Review healthy ways to manage with stress and anxiety.    Initial Review & Psychosocial Screening:  Initial Psych Review & Screening - 12/31/19 1434      Initial Review   Current issues with None Identified      Family Dynamics   Good Support System? Yes    Comments He can look to his wife and son.      Barriers   Psychosocial barriers to participate in program The patient should benefit from training in stress management and relaxation.;There are no identifiable barriers or psychosocial needs.      Screening Interventions   Interventions Encouraged to exercise    Expected Outcomes Short Term goal: Utilizing psychosocial counselor, staff and physician to assist with identification of specific Stressors or current issues interfering with healing process. Setting desired goal for each stressor or current issue identified.;Long Term Goal: Stressors or current issues are controlled or eliminated.;Short Term goal: Identification  and review with participant of any Quality of Life or Depression concerns found by scoring the questionnaire.;Long Term goal: The  participant improves quality of Life and PHQ9 Scores as seen by post scores and/or verbalization of changes           Quality of Life Scores:   Quality of Life - 01/03/20 1043      Quality of Life   Select Quality of Life      Quality of Life Scores   Health/Function Pre 21.27 %    Socioeconomic Pre 19.36 %    Psych/Spiritual Pre 16.79 %    Family Pre 24.9 %    GLOBAL Pre 20.49 %          Scores of 19 and below usually indicate a poorer quality of life in these areas.  A difference of  2-3 points is a clinically meaningful difference.  A difference of 2-3 points in the total score of the Quality of Life Index has been associated with significant improvement in overall quality of life, self-image, physical symptoms, and general health in studies assessing change in quality of life.  PHQ-9: Recent Review Flowsheet Data    Depression screen [facility] 2/9 01/03/2020 07/16/2019 06/28/2017 02/14/2017   Decreased Interest 1 0 0 0   Down, Depressed, Hopeless 1 0 0 0   PHQ - 2 Score 2 0 0 0   Altered sleeping 1 0 0 -   Tired, decreased energy 1 0 0 -   Change in appetite 0 0 0 -   Feeling bad or failure about yourself  0 0 0 -   Trouble concentrating 1 0 0 -   Moving slowly or fidgety/restless 0 0 0 -   Suicidal thoughts 0 0 0 -   PHQ-9 Score 5 0 0 -   Difficult doing work/chores Not difficult at all Not difficult at all Not difficult at all -     Interpretation of Total Score  Total Score Depression Severity:  1-4 = Minimal depression, 5-9 = Mild depression, 10-14 = Moderate depression, 15-19 = Moderately severe depression, 20-27 = Severe depression   Psychosocial Evaluation and Intervention:  Psychosocial Evaluation - 12/31/19 1435      Psychosocial Evaluation & Interventions   Interventions Encouraged to exercise with the program and follow exercise prescription    Comments He can look to his wife and son. He has no other concerns other than everyday life.    Expected Outcomes  Short: Exercise regularly to support mental health and notify staff of any changes. Long: maintain mental health and well being through teaching of rehab or prescribed medications independently.    Continue Psychosocial Services  Follow up required by staff           Psychosocial Re-Evaluation:   Psychosocial Discharge (Final Psychosocial Re-Evaluation):   Vocational Rehabilitation: Provide vocational rehab assistance to qualifying candidates.   Vocational Rehab Evaluation & Intervention:   Education: Education Goals: Education classes will be provided on a variety of topics geared toward better understanding of heart health and risk factor modification. Participant will state understanding/return demonstration of topics presented as noted by education test scores.  Learning Barriers/Preferences:  Learning Barriers/Preferences - 12/31/19 1432      Learning Barriers/Preferences   Learning Barriers None    Learning Preferences None           General Cardiac Education Topics:  AED/CPR: - Group verbal and written instruction with the use of models to demonstrate the basic  use of the AED with the basic ABC's of resuscitation.   Anatomy & Physiology of the Heart: - Group verbal and written instruction and models provide basic cardiac anatomy and physiology, with the coronary electrical and arterial systems. Review of Valvular disease and Heart Failure   Cardiac Procedures: - Group verbal and written instruction to review commonly prescribed medications for heart disease. Reviews the medication, class of the drug, and side effects. Includes the steps to properly store meds and maintain the prescription regimen. (beta blockers and nitrates)   Cardiac Medications I: - Group verbal and written instruction to review commonly prescribed medications for heart disease. Reviews the medication, class of the drug, and side effects. Includes the steps to properly store meds and maintain  the prescription regimen.   Cardiac Medications II: -Group verbal and written instruction to review commonly prescribed medications for heart disease. Reviews the medication, class of the drug, and side effects. (all other drug classes)    Go Sex-Intimacy & Heart Disease, Get SMART - Goal Setting: - Group verbal and written instruction through game format to discuss heart disease and the return to sexual intimacy. Provides group verbal and written material to discuss and apply goal setting through the application of the S.M.A.R.T. Method.   Other Matters of the Heart: - Provides group verbal, written materials and models to describe Stable Angina and Peripheral Artery. Includes description of the disease process and treatment options available to the cardiac patient.   Infection Prevention: - Provides verbal and written material to individual with discussion of infection control including proper hand washing and proper equipment cleaning during exercise session.   Cardiac Rehab from 12/31/2019 in [facility] Cardiac and Pulmonary Rehab  Date 12/31/19  Educator [facility]  Instruction Review Code 1- Verbalizes Understanding      Falls Prevention: - Provides verbal and written material to individual with discussion of falls prevention and safety.   Cardiac Rehab from 12/31/2019 in [facility] Cardiac and Pulmonary Rehab  Date 12/31/19  Educator [facility]  Instruction Review Code 1- Verbalizes Understanding      Other: -Provides group and verbal instruction on various topics (see comments)   Knowledge Questionnaire Score:  Knowledge Questionnaire Score - 01/03/20 1038      Knowledge Questionnaire Score   Pre Score 20/26: Heart Disease,Nutrition, Exercise           Core Components/Risk Factors/Patient Goals at Admission:  Personal Goals and Risk Factors at Admission - 01/03/20 1045      Core Components/Risk Factors/Patient Goals on Admission    Weight Management Yes;Obesity;Weight Loss     Intervention Weight Management: Provide education and appropriate resources to help participant work on and attain dietary goals.;Weight Management: Develop a combined nutrition and exercise program designed to reach desired caloric intake, while maintaining appropriate intake of nutrient and fiber, sodium and fats, and appropriate energy expenditure required for the weight goal.;Weight Management/Obesity: Establish reasonable short term and long term weight goals.;Obesity: Provide education and appropriate resources to help participant work on and attain dietary goals.    Admit Weight 253 lb 11.2 oz (115.1 kg)    Goal Weight: Short Term 248 lb (112.5 kg)    Goal Weight: Long Term 243 lb (110.2 kg)    Expected Outcomes Short Term: Continue to assess and modify interventions until short term weight is achieved;Long Term: Adherence to nutrition and physical activity/exercise program aimed toward attainment of established weight goal;Weight Maintenance: Understanding of the daily nutrition guidelines, which includes 25-35% calories from fat, 7%  or less cal from saturated fats, less than 25m cholesterol, less than 1.5gm of sodium, & 5 or more servings of fruits and vegetables daily;Weight Loss: Understanding of general recommendations for a balanced deficit meal plan, which promotes 1-2 lb weight loss per week and includes a negative energy balance of [phone] kcal/d;Understanding recommendations for meals to include 15-35% energy as protein, 25-35% energy from fat, 35-60% energy from carbohydrates, less than 2077mof dietary cholesterol, 20-35 gm of total fiber daily;Understanding of distribution of calorie intake throughout the day with the consumption of 4-5 meals/snacks    Diabetes Yes    Intervention Provide education about signs/symptoms and action to take for hypo/hyperglycemia.;Provide education about proper nutrition, including hydration, and aerobic/resistive exercise prescription along with prescribed  medications to achieve blood glucose in normal ranges: Fasting glucose 65-99 mg/dL    Expected Outcomes Short Term: Participant verbalizes understanding of the signs/symptoms and immediate care of hyper/hypoglycemia, proper foot care and importance of medication, aerobic/resistive exercise and nutrition plan for blood glucose control.;Long Term: Attainment of HbA1C < 7%.    Hypertension Yes    Intervention Provide education on lifestyle modifcations including regular physical activity/exercise, weight management, moderate sodium restriction and increased consumption of fresh fruit, vegetables, and low fat dairy, alcohol moderation, and smoking cessation.;Monitor prescription use compliance.    Expected Outcomes Short Term: Continued assessment and intervention until BP is < 140/9053mG in hypertensive participants. < 130/29m51m in hypertensive participants with diabetes, heart failure or chronic kidney disease.;Long Term: Maintenance of blood pressure at goal levels.    Lipids Yes    Intervention Provide education and support for participant on nutrition & aerobic/resistive exercise along with prescribed medications to achieve LDL <70mg10mL >40mg.17mExpected Outcomes Short Term: Participant states understanding of desired cholesterol values and is compliant with medications prescribed. Participant is following exercise prescription and nutrition guidelines.;Long Term: Cholesterol controlled with medications as prescribed, with individualized exercise RX and with personalized nutrition plan. Value goals: LDL < 70mg, 47m> 40 mg.           Education:Diabetes - Individual verbal and written instruction to review signs/symptoms of diabetes, desired ranges of glucose level fasting, after meals and with exercise. Acknowledge that pre and post exercise glucose checks will be done for 3 sessions at entry of program.   [facility] and Pul[facility]  Date 12/31/19   Educator [facility] Code 1- Verbalizes Understanding      Education: Know Your Numbers and Risk Factors: -Group verbal and written instruction about important numbers in your health.  Discussion of what are risk factors and how they play a role in the disease process.  Review of Cholesterol, Blood Pressure, Diabetes, and BMI and the role they play in your overall health.   Core Components/Risk Factors/Patient Goals Review:    Core Components/Risk Factors/Patient Goals at Discharge (Final Review):    ITP Comments:  ITP Comments    Row Name 12/31/19 1437 01/03/20 1011         ITP Comments Virtual Visit completed. Patient informed on EP and RD appointment and 6 Minute walk test. Patient also informed of patient health questionnaires on My Chart. Patient Verbalizes understanding. Visit diagnosis can be found in [facility] 6/7[facility]21. Completed 6MWT and gym orientation. Initial ITP created and sent for review to Dr. Mark MiEmily Filbertal Director.             Comments: Initial ITP

## 2020-01-03 NOTE — Patient Instructions (Addendum)
Patient Instructions  Patient Details  Name: Jeffery West MRN: [id number] Date of Birth: Mar 26, 1956 Referring Provider:  Minna Merritts, MD  Below are your personal goals for exercise, nutrition, and risk factors. Our goal is to help you stay on track towards obtaining and maintaining these goals. We will be discussing your progress on these goals with you throughout the program.  Initial Exercise Prescription:  Initial Exercise Prescription - 01/03/20 1300      Date of Initial Exercise RX and Referring Provider   Date 01/03/20    Referring Provider [name], MD      Treadmill   MPH 1.9    Grade 0.5    Minutes 15    METs 2.59      REL-XR   Level 2    Speed 50    Minutes 15    METs 2.6      T5 Nustep   Level 2    SPM 80    Minutes 15    METs 2.6      Prescription Details   Frequency (times per week) 2    Duration Progress to 30 minutes of continuous aerobic without signs/symptoms of physical distress      Intensity   THRR 40-80% of Max Heartrate 103-139    Ratings of Perceived Exertion 11-13    Perceived Dyspnea 0-4      Progression   Progression Continue to progress workloads to maintain intensity without signs/symptoms of physical distress.      Resistance Training   Training Prescription Yes    Weight 3 lb    Reps 10-15           Exercise Goals: Frequency: Be able to perform aerobic exercise two to three times per week in program working toward 2-5 days per week of home exercise.  Intensity: Work with a perceived exertion of 11 (fairly light) - 15 (hard) while following your exercise prescription.  We will make changes to your prescription with you as you progress through the program.   Duration: Be able to do 30 to 45 minutes of continuous aerobic exercise in addition to a 5 minute warm-up and a 5 minute cool-down routine.   Nutrition Goals: Your personal nutrition goals will be established when you do your nutrition analysis with the  dietician.  The following are general nutrition guidelines to follow: Cholesterol < 200mg /day Sodium < 1500mg /day Fiber: Men over 50 yrs - 30 grams per day  Personal Goals:  Personal Goals and Risk Factors at Admission - 01/03/20 1045      Core Components/Risk Factors/Patient Goals on Admission    Weight Management Yes;Obesity;Weight Loss    Intervention Weight Management: Provide education and appropriate resources to help participant work on and attain dietary goals.;Weight Management: Develop a combined nutrition and exercise program designed to reach desired caloric intake, while maintaining appropriate intake of nutrient and fiber, sodium and fats, and appropriate energy expenditure required for the weight goal.;Weight Management/Obesity: Establish reasonable short term and long term weight goals.;Obesity: Provide education and appropriate resources to help participant work on and attain dietary goals.    Admit Weight 253 lb 11.2 oz (115.1 kg)    Goal Weight: Short Term 248 lb (112.5 kg)    Goal Weight: Long Term 243 lb (110.2 kg)    Expected Outcomes Short Term: Continue to assess and modify interventions until short term weight is achieved;Long Term: Adherence to nutrition and physical activity/exercise program aimed toward attainment of established weight goal;Weight  Maintenance: Understanding of the daily nutrition guidelines, which includes 25-35% calories from fat, 7% or less cal from saturated fats, less than 200mg  cholesterol, less than 1.5gm of sodium, & 5 or more servings of fruits and vegetables daily;Weight Loss: Understanding of general recommendations for a balanced deficit meal plan, which promotes 1-2 lb weight loss per week and includes a negative energy balance of [phone] kcal/d;Understanding recommendations for meals to include 15-35% energy as protein, 25-35% energy from fat, 35-60% energy from carbohydrates, less than 200mg  of dietary cholesterol, 20-35 gm of total fiber  daily;Understanding of distribution of calorie intake throughout the day with the consumption of 4-5 meals/snacks    Diabetes Yes    Intervention Provide education about signs/symptoms and action to take for hypo/hyperglycemia.;Provide education about proper nutrition, including hydration, and aerobic/resistive exercise prescription along with prescribed medications to achieve blood glucose in normal ranges: Fasting glucose 65-99 mg/dL    Expected Outcomes Short Term: Participant verbalizes understanding of the signs/symptoms and immediate care of hyper/hypoglycemia, proper foot care and importance of medication, aerobic/resistive exercise and nutrition plan for blood glucose control.;Long Term: Attainment of HbA1C < 7%.    Hypertension Yes    Intervention Provide education on lifestyle modifcations including regular physical activity/exercise, weight management, moderate sodium restriction and increased consumption of fresh fruit, vegetables, and low fat dairy, alcohol moderation, and smoking cessation.;Monitor prescription use compliance.    Expected Outcomes Short Term: Continued assessment and intervention until BP is < 140/57mm HG in hypertensive participants. < 130/49mm HG in hypertensive participants with diabetes, heart failure or chronic kidney disease.;Long Term: Maintenance of blood pressure at goal levels.    Lipids Yes    Intervention Provide education and support for participant on nutrition & aerobic/resistive exercise along with prescribed medications to achieve LDL 70mg , HDL >40mg .    Expected Outcomes Short Term: Participant states understanding of desired cholesterol values and is compliant with medications prescribed. Participant is following exercise prescription and nutrition guidelines.;Long Term: Cholesterol controlled with medications as prescribed, with individualized exercise RX and with personalized nutrition plan. Value goals: LDL < 70mg , HDL > 40 mg.           Tobacco  Use Initial Evaluation: Social History   Tobacco Use  Smoking Status Never Smoker  Smokeless Tobacco Never Used    Exercise Goals and Review:  Exercise Goals    Row Name 01/03/20 1044             Exercise Goals   Increase Physical Activity Yes       Intervention Provide advice, education, support and counseling about physical activity/exercise needs.;Develop an individualized exercise prescription for aerobic and resistive training based on initial evaluation findings, risk stratification, comorbidities and participant's personal goals.       Expected Outcomes Short Term: Attend rehab on a regular basis to increase amount of physical activity.;Long Term: Add in home exercise to make exercise part of routine and to increase amount of physical activity.;Long Term: Exercising regularly at least 3-5 days a week.       Increase Strength and Stamina Yes       Intervention Provide advice, education, support and counseling about physical activity/exercise needs.;Develop an individualized exercise prescription for aerobic and resistive training based on initial evaluation findings, risk stratification, comorbidities and participant's personal goals.       Expected Outcomes Short Term: Increase workloads from initial exercise prescription for resistance, speed, and METs.;Short Term: Perform resistance training exercises routinely during rehab and add in resistance training  at home;Long Term: Improve cardiorespiratory fitness, muscular endurance and strength as measured by increased METs and functional capacity (6MWT)       Able to understand and use rate of perceived exertion (RPE) scale Yes       Intervention Provide education and explanation on how to use RPE scale       Expected Outcomes Short Term: Able to use RPE daily in rehab to express subjective intensity level;Long Term:  Able to use RPE to guide intensity level when exercising independently       Able to understand and use Dyspnea scale  Yes       Intervention Provide education and explanation on how to use Dyspnea scale       Expected Outcomes Short Term: Able to use Dyspnea scale daily in rehab to express subjective sense of shortness of breath during exertion;Long Term: Able to use Dyspnea scale to guide intensity level when exercising independently       Knowledge and understanding of Target Heart Rate Range (THRR) Yes       Intervention Provide education and explanation of THRR including how the numbers were predicted and where they are located for reference       Expected Outcomes Short Term: Able to state/look up THRR;Short Term: Able to use daily as guideline for intensity in rehab;Long Term: Able to use THRR to govern intensity when exercising independently       Able to check pulse independently Yes       Intervention Provide education and demonstration on how to check pulse in carotid and radial arteries.;Review the importance of being able to check your own pulse for safety during independent exercise       Expected Outcomes Short Term: Able to explain why pulse checking is important during independent exercise;Long Term: Able to check pulse independently and accurately       Understanding of Exercise Prescription Yes       Intervention Provide education, explanation, and written materials on patient's individual exercise prescription       Expected Outcomes Short Term: Able to explain program exercise prescription;Long Term: Able to explain home exercise prescription to exercise independently              Copy of goals given to participant.

## 2020-01-07 ENCOUNTER — Other Ambulatory Visit: Payer: Self-pay | Admitting: Family Medicine

## 2020-01-07 DIAGNOSIS — I10 Essential (primary) hypertension: Secondary | ICD-10-CM

## 2020-01-08 ENCOUNTER — Other Ambulatory Visit: Payer: Self-pay

## 2020-01-08 ENCOUNTER — Encounter: Payer: No Typology Code available for payment source | Admitting: *Deleted

## 2020-01-08 DIAGNOSIS — Z951 Presence of aortocoronary bypass graft: Secondary | ICD-10-CM | POA: Diagnosis not present

## 2020-01-08 LAB — GLUCOSE, CAPILLARY
Glucose-Capillary: 246 mg/dL — ABNORMAL HIGH (ref 70–99)
Glucose-Capillary: 180 mg/dL — ABNORMAL HIGH (ref 70–99)

## 2020-01-08 NOTE — Progress Notes (Signed)
Daily Session Note  Patient Details  Name: Jeffery West MRN: [id number] Date of Birth: 10-Mar-1956 Referring Provider:     Cardiac Rehab from 01/03/2020 in [facility] Cardiac and Pulmonary Rehab  Referring Provider [name], MD      Encounter Date: 01/08/2020  Check In:  Session Check In - 01/08/20 0758      Check-In   Supervising physician immediately available to respond to emergencies See telemetry face sheet for immediately available ER MD    Location [facility]-Cardiac & Pulmonary Rehab    Staff Present Nyoka Cowden, RN, BSN, Tyna Jaksch, MS Exercise Physiologist;Amanda Oletta Darter, [place], ACSM CEP, Exercise Physiologist    Medication changes reported     No    Fall or balance concerns reported    No    Warm-up and Cool-down Performed on first and last piece of equipment    Resistance Training Performed Yes    VAD Patient? No      Pain Assessment   Currently in Pain? No/denies              Social History   Tobacco Use  Smoking Status Never Smoker  Smokeless Tobacco Never Used    Goals Met:  Independence with exercise equipment Exercise tolerated well No report of cardiac concerns or symptoms Strength training completed today  Goals Unmet:  Not Applicable  Comments: Pt able to follow exercise prescription today without complaint.  Will continue to monitor for progression.    Dr. Emily Filbert is Medical Director for [place] and [facility].

## 2020-01-10 ENCOUNTER — Encounter: Payer: No Typology Code available for payment source | Admitting: *Deleted

## 2020-01-10 ENCOUNTER — Other Ambulatory Visit: Payer: Self-pay

## 2020-01-10 DIAGNOSIS — Z951 Presence of aortocoronary bypass graft: Secondary | ICD-10-CM | POA: Diagnosis not present

## 2020-01-10 LAB — GLUCOSE, CAPILLARY
Glucose-Capillary: 239 mg/dL — ABNORMAL HIGH (ref 70–99)
Glucose-Capillary: 172 mg/dL — ABNORMAL HIGH (ref 70–99)

## 2020-01-10 NOTE — Progress Notes (Signed)
Daily Session Note  Patient Details  Name: Jeffery West MRN: [id number] Date of Birth: Mar 04, 1956 Referring Provider:     Cardiac Rehab from 01/03/2020 in [facility] Cardiac and Pulmonary Rehab  Referring Provider [name], MD      Encounter Date: 01/10/2020  Check In:  Session Check In - 01/10/20 0806      Check-In   Supervising physician immediately available to respond to emergencies See telemetry face sheet for immediately available ER MD    Location [facility]-Cardiac & Pulmonary Rehab    Staff Present Heath Lark, RN, BSN, CCRP;Amanda Sommer, BA, ACSM CEP, Exercise Physiologist;Jessica Battlement [place], MA, RCEP, CCRP, CCET    Virtual Visit No    Medication changes reported     No    Fall or balance concerns reported    No    Warm-up and Cool-down Performed on first and last piece of equipment    Resistance Training Performed Yes    VAD Patient? No    PAD/SET Patient? No      Pain Assessment   Currently in Pain? No/denies              Social History   Tobacco Use  Smoking Status Never Smoker  Smokeless Tobacco Never Used    Goals Met:  Exercise tolerated well No report of cardiac concerns or symptoms  Goals Unmet:  Not Applicable  Comments: Pt able to follow exercise prescription today without complaint.  Will continue to monitor for progression.    Dr. Emily Filbert is Medical Director for [place] and [facility].

## 2020-01-15 ENCOUNTER — Encounter: Payer: No Typology Code available for payment source | Admitting: *Deleted

## 2020-01-15 ENCOUNTER — Other Ambulatory Visit: Payer: Self-pay

## 2020-01-15 DIAGNOSIS — Z951 Presence of aortocoronary bypass graft: Secondary | ICD-10-CM

## 2020-01-15 LAB — GLUCOSE, CAPILLARY
Glucose-Capillary: 192 mg/dL — ABNORMAL HIGH (ref 70–99)
Glucose-Capillary: 147 mg/dL — ABNORMAL HIGH (ref 70–99)

## 2020-01-15 NOTE — Progress Notes (Signed)
Daily Session Note  Patient Details  Name: Jeffery West MRN: [id number] Date of Birth: 05-May-1956 Referring Provider:     Cardiac Rehab from 01/03/2020 in [facility] Cardiac and Pulmonary Rehab  Referring Provider Jeffery Rogue, MD      Encounter Date: 01/15/2020  Check In:  Session Check In - 01/15/20 0803      Check-In   Staff Present Jeffery Lark, RN, BSN, Jeffery West, BA, ACSM CEP, Exercise Physiologist;Jeffery West RDN, LDN    Virtual Visit No    Medication changes reported     No    Fall or balance concerns reported    No    Warm-up and Cool-down Performed on first and last piece of equipment    Resistance Training Performed Yes    VAD Patient? No    PAD/SET Patient? No      Pain Assessment   Currently in Pain? No/denies              Social History   Tobacco Use  Smoking Status Never Smoker  Smokeless Tobacco Never Used    Goals Met:  Independence with exercise equipment Exercise tolerated well No report of cardiac concerns or symptoms  Goals Unmet:  Not Applicable  Comments: Pt able to follow exercise prescription today without complaint.  Will continue to monitor for progression.    Dr. Emily West is Medical Director for [place] and [facility].

## 2020-01-16 NOTE — Progress Notes (Signed)
Established patient visit  I,Jeffery West,acting as a scribe for Jeffery Durie, MD.,have documented all relevant documentation on the behalf of Jeffery Durie, MD,as directed by  Jeffery Durie, MD while in the presence of Jeffery Durie, MD.   Patient: Jeffery West   DOB: 21-Sep-1955   64 y.o. Male  MRN: [id number] Visit Date: 01/17/2020  Today's healthcare provider: [name], MD   Chief Complaint  Patient presents with  . Diabetes  . Follow-up   Subjective    HPI  Patient says he  is working hard on his diet and exercise.  He has not lost any weight. Diabetes Mellitus Type II, follow-up  Lab Results  Component Value Date   HGBA1C 11.4 (H) 11/22/2019   HGBA1C 11.4 (H) 07/20/2019   HGBA1C 8.3 (A) 04/18/2018   Last seen for diabetes 1 months ago.  Management since then includes; Follow-up in 1 month with 3 times daily blood sugars.  Goal is to get back to p.o. medications possibly off insulin as patient is able to lose enough weight. He reports good compliance with treatment. He is not having side effects. none  Home blood sugar records: fasting range: 162-246  Episodes of hypoglycemia? No none   Current insulin regiment: levemir and Novolog Most Recent Eye Exam: due  --------------------------------------------------------------------  Patient Active Problem List   Diagnosis Date Noted  . S/P AVR 11/26/2019  . Unstable angina ([place]) 10/19/2019  . Encounter for screening colonoscopy   . Polyp of ascending colon   . Aortic valve stenosis 07/11/2017  . HTN (hypertension) 07/09/2017  . Smoker 07/09/2017  . Murmur 07/09/2017  . AB (asthmatic bronchitis) 02/26/2015  . Adaptation reaction 02/26/2015  . Allergic rhinitis 02/26/2015  . Poorly controlled type 2 diabetes mellitus ([place]) 02/26/2015  . ED (erectile dysfunction) of organic origin 02/26/2015  . Gout 02/26/2015  . HLD (hyperlipidemia) 02/26/2015  . Morbid obesity ([place])  02/26/2015  . Arthritis, degenerative 02/26/2015  . Avitaminosis D 02/26/2015  . Airway hyperreactivity 02/26/2015  . Controlled type 2 diabetes mellitus without complication ([place]) 62/26/3335  . Erectile dysfunction associated with type 2 diabetes mellitus ([place]) 01/07/2015       Medications: Outpatient Medications Prior to Visit  Medication Sig  . acetaminophen (TYLENOL) 500 MG tablet Take 1,000 mg by mouth every 6 (six) hours as needed for moderate pain or headache.  . albuterol (PROAIR HFA) 108 (90 Base) MCG/ACT inhaler Inhale 1 puff into the lungs every 6 (six) hours as needed for wheezing or shortness of breath.  . allopurinol (ZYLOPRIM) 300 MG tablet Take 1 tablet (300 mg total) by mouth daily.  Marland Kitchen aspirin EC 325 MG EC tablet Take 1 tablet (325 mg total) by mouth daily.  . Cholecalciferol (VITAMIN D) 2000 units CAPS Take 2,000 Units by mouth every evening.   . ezetimibe (ZETIA) 10 MG tablet TAKE 1 TABLET BY MOUTH EVERY DAY  . ezetimibe (ZETIA) 10 MG tablet Take 10 mg by mouth daily.  Marland Kitchen glucose blood (ONETOUCH VERIO) test strip Use as instructed; BID  . Insulin Syringe-Needle U-100 30G X 1/2" 1 ML MISC Use with insulin 3 times daily  . losartan (COZAAR) 50 MG tablet Take 50 mg by mouth daily.  Marland Kitchen losartan (COZAAR) 50 MG tablet TAKE 1 TABLET BY MOUTH EVERY DAY  . magnesium oxide (MAG-OX) 400 MG tablet TAKE 1 TABLET BY MOUTH TWICE A DAY (Patient taking differently: Take 400 mg by mouth 2 (two) times daily. )  .  magnesium oxide (MAG-OX) 400 MG tablet Take 400 mg by mouth 2 (two) times daily.   . metFORMIN (GLUCOPHAGE) 1000 MG tablet Take 1 tablet (1,000 mg total) by mouth 2 (two) times daily with a meal.  . metoprolol tartrate (LOPRESSOR) 25 MG tablet Take 1 tablet (25 mg total) by mouth 2 (two) times daily.  Marland Kitchen nystatin cream (MYCOSTATIN) Apply 1 application topically 2 (two) times daily.  [name] Delica Lancets 16X MISC Use 3 times a day  . rosuvastatin (CRESTOR) 5 MG tablet Take 1  tablet (5 mg total) by mouth daily.  . tamsulosin (FLOMAX) 0.4 MG CAPS capsule TAKE 1 CAPSULE BY MOUTH EVERY DAY  . [DISCONTINUED] insulin aspart (NOVOLOG) 100 UNIT/ML injection Inject 6 Units into the skin 3 (three) times daily with meals.  . [DISCONTINUED] insulin detemir (LEVEMIR) 100 UNIT/ML injection Inject 0.55 mLs (55 Units total) into the skin daily.  . BD PEN NEEDLE NANO 2ND GEN 32G X 4 MM MISC USE AS DIRECTED (Patient not taking: Reported on 12/31/2019)   No facility-administered medications prior to visit.    Review of Systems  Constitutional: Negative for appetite change, chills and fever.  Respiratory: Negative for chest tightness, shortness of breath and wheezing.   Cardiovascular: Negative for chest pain and palpitations.  Gastrointestinal: Negative for abdominal pain, nausea and vomiting.    Last hemoglobin A1c Lab Results  Component Value Date   HGBA1C 11.4 (H) 11/22/2019      Objective    BP 118/67 (BP Location: Left Arm, Patient Position: Sitting, Cuff Size: Large)   Pulse 67   Temp 98.9 F (37.2 C) (Oral)   Resp 18   Ht 5\' 11"  (1.803 m)   Wt (!) 258 lb (117 kg)   SpO2 95%   BMI 35.98 kg/m  BP Readings from Last 3 Encounters:  01/17/20 118/67  12/26/19 134/75  12/14/19 120/60   Wt Readings from Last 3 Encounters:  01/17/20 (!) 258 lb (117 kg)  01/03/20 253 lb 11.2 oz (115.1 kg)  12/26/19 254 lb (115.2 kg)      Physical Exam  BP 118/67 (BP Location: Left Arm, Patient Position: Sitting, Cuff Size: Large)   Pulse 67   Temp 98.9 F (37.2 C) (Oral)   Resp 18   Ht 5\' 11"  (1.803 m)   Wt (!) 258 lb (117 kg)   SpO2 95%   BMI 35.98 kg/m   General Appearance:    Alert, cooperative, no distress, appears stated age  Head:    Normocephalic, without obvious abnormality, atraumatic  Eyes:    PERRL, conjunctiva/corneas clear, EOM's intact, fundi    benign, both eyes       Ears:    Normal TM's and external ear canals, both ears  Nose:   Nares normal,  septum midline, mucosa normal, no drainage   or sinus tenderness  Throat:   Lips, mucosa, and tongue normal; teeth and gums normal  Neck:   Supple, symmetrical, trachea midline, no adenopathy;       thyroid:  No enlargement/tenderness/nodules; no carotid   bruit or JVD  Back:     Symmetric, no curvature, ROM normal, no CVA tenderness  Lungs:     Clear to auscultation bilaterally, respirations unlabored  Chest wall:    No tenderness or deformity  Heart:    Regular rate and rhythm, S1 and S2 normal, no murmur, rub   or gallop  Abdomen:     Soft, non-tender, bowel sounds active all four quadrants,  no masses, no organomegaly  Genitalia:    Normal male without lesion, discharge or tenderness  Rectal:    Normal tone, normal prostate, no masses or tenderness;   guaiac negative stool  Extremities:   Extremities normal, atraumatic, no cyanosis or edema  Pulses:   2+ and symmetric all extremities  Skin:   Skin color, texture, turgor normal, no rashes or lesions  Lymph nodes:   Cervical, supraclavicular, and axillary nodes normal  Neurologic:   CNII-XII intact. Normal strength, sensation and reflexes      throughout     No results found for any visits on 01/17/20.  Assessment & Plan     1. Controlled type 2 diabetes mellitus without complication, without long-term current use of insulin (HCC) Stop Novolog Breakfast and Lunch Doses. Decrease Levemir dose from 55 units t 50 units daily. Consider weaning insulin and starting Ozempic or previous medication on next visit.  Also consider Jardiance. - insulin aspart (NOVOLOG) 100 UNIT/ML injection; Inject 6 Units into the skin every evening.  Dispense: 10 mL; Refill: 1 - insulin detemir (LEVEMIR) 100 UNIT/ML injection; Inject 0.5 mLs (50 Units total) into the skin daily.  Dispense: 10 mL; Refill: 3  2. Coronary artery disease of bypass graft of native heart with stable angina pectoris ([place]) All risk factors treated.  Status post CABG x1.  Also  status post aortic valve replacement  3. Type 2 diabetes mellitus with other specified complication, without long-term current use of insulin ([place])   4. Type 2 diabetes mellitus with other circulatory complication, without long-term current use of insulin ([place])   5. Hx of CABG   6. Morbid obesity ([employer]) Diet and exercise stressed to this patient.  He has not been motivated in the past at all to make changes in this.   Return in 6 weeks (on 02/26/2020).         Collins Kerby Cranford Mon, MD  [facility] [phone] (phone) [phone] (fax)  [place]

## 2020-01-17 ENCOUNTER — Encounter: Payer: No Typology Code available for payment source | Admitting: *Deleted

## 2020-01-17 ENCOUNTER — Ambulatory Visit (INDEPENDENT_AMBULATORY_CARE_PROVIDER_SITE_OTHER): Payer: No Typology Code available for payment source | Admitting: Family Medicine

## 2020-01-17 ENCOUNTER — Encounter: Payer: Self-pay | Admitting: Family Medicine

## 2020-01-17 ENCOUNTER — Other Ambulatory Visit: Payer: Self-pay

## 2020-01-17 VITALS — BP 118/67 | HR 67 | Temp 98.9°F | Resp 18 | Ht 71.0 in | Wt 258.0 lb

## 2020-01-17 DIAGNOSIS — I25708 Atherosclerosis of coronary artery bypass graft(s), unspecified, with other forms of angina pectoris: Secondary | ICD-10-CM | POA: Diagnosis not present

## 2020-01-17 DIAGNOSIS — Z951 Presence of aortocoronary bypass graft: Secondary | ICD-10-CM

## 2020-01-17 DIAGNOSIS — E1169 Type 2 diabetes mellitus with other specified complication: Secondary | ICD-10-CM

## 2020-01-17 DIAGNOSIS — E119 Type 2 diabetes mellitus without complications: Secondary | ICD-10-CM | POA: Diagnosis not present

## 2020-01-17 DIAGNOSIS — E1159 Type 2 diabetes mellitus with other circulatory complications: Secondary | ICD-10-CM

## 2020-01-17 MED ORDER — INSULIN DETEMIR 100 UNIT/ML ~~LOC~~ SOLN: 50.0000 [IU] | Freq: Every day | SUBCUTANEOUS | 3 refills | Status: DC

## 2020-01-17 MED ORDER — INSULIN ASPART 100 UNIT/ML ~~LOC~~ SOLN: 6.0000 [IU] | Freq: Every evening | SUBCUTANEOUS | 1 refills | Status: DC

## 2020-01-17 NOTE — Progress Notes (Signed)
Daily Session Note  Patient Details  Name: Jeffery West MRN: [id number] Date of Birth: 09/08/1955 Referring Provider:     Cardiac Rehab from 01/03/2020 in [facility] Cardiac and Pulmonary Rehab  Referring Provider [name], MD      Encounter Date: 01/17/2020  Check In:  Session Check In - 01/17/20 0850      Check-In   Supervising physician immediately available to respond to emergencies See telemetry face sheet for immediately available ER MD    Location [facility]-Cardiac & Pulmonary Rehab    Staff Present Renita Papa, RN BSN;Jessica Luan Pulling, MA, RCEP, CCRP, CCET;Melissa [place] RDN, Rowe Pavy, [place], ACSM CEP, Exercise Physiologist    Virtual Visit No    Medication changes reported     No    Fall or balance concerns reported    No    Warm-up and Cool-down Performed on first and last piece of equipment    Resistance Training Performed Yes    VAD Patient? No    PAD/SET Patient? No      Pain Assessment   Currently in Pain? No/denies              Social History   Tobacco Use  Smoking Status Never Smoker  Smokeless Tobacco Never Used    Goals Met:  Independence with exercise equipment Exercise tolerated well No report of cardiac concerns or symptoms Strength training completed today  Goals Unmet:  Not Applicable  Comments: Pt able to follow exercise prescription today without complaint.  Will continue to monitor for progression.    Dr. Emily Filbert is Medical Director for [place] and [facility].

## 2020-01-17 NOTE — Patient Instructions (Addendum)
Stop Novolog Breakfast and Lunch Doses. Decrease Levemir dose from 55 units t 50 units daily.

## 2020-01-19 ENCOUNTER — Other Ambulatory Visit: Payer: Self-pay | Admitting: Family Medicine

## 2020-01-19 DIAGNOSIS — R252 Cramp and spasm: Secondary | ICD-10-CM

## 2020-01-19 DIAGNOSIS — E119 Type 2 diabetes mellitus without complications: Secondary | ICD-10-CM

## 2020-01-19 NOTE — Telephone Encounter (Signed)
Requested Prescriptions  Pending Prescriptions Disp Refills  . metFORMIN (GLUCOPHAGE) 500 MG tablet [Pharmacy Med Name: METFORMIN HCL 500 MG TABLET] 180 tablet     Sig: TAKE 1 TABLET BY MOUTH TWICE A DAY WITH MEALS     Endocrinology:  Diabetes - Biguanides Failed - 01/19/2020  9:34 AM      Failed - HBA1C is between 0 and 7.9 and within 180 days    Hgb A1c MFr Bld  Date Value Ref Range Status  11/22/2019 11.4 (H) 4.8 - 5.6 % Final    Comment:    (NOTE) Pre diabetes:          5.7%-6.4% Diabetes:              >6.4% Glycemic control for   <7.0% adults with diabetes          Passed - Cr in normal range and within 360 days    Creat  Date Value Ref Range Status  05/23/2017 0.77 0.70 - 1.25 mg/dL Final    Comment:    For patients >52 years of age, the reference limit for Creatinine is approximately 13% higher for people identified as African-American. .    Creatinine, Ser  Date Value Ref Range Status  12/18/2019 0.97 0.76 - 1.27 mg/dL Final         Passed - eGFR in normal range and within 360 days    GFR, Est African American  Date Value Ref Range Status  05/23/2017 114 > OR = 60 mL/min/1.37m Final   GFR calc Af Amer  Date Value Ref Range Status  12/18/2019 96 >59 mL/min/1.73 Final    Comment:    **[facility] currently reports eGFR in compliance with the current**   recommendations of the [employer] [facility] will   update reporting as new guidelines are published from the NKF-ASN   Task force.    GFR, Est Non African American  Date Value Ref Range Status  05/23/2017 99 > OR = 60 mL/min/1.74mFinal   GFR calc non Af Amer  Date Value Ref Range Status  12/18/2019 83 >59 mL/min/1.73 Final         Passed - Valid encounter within last 6 months    Recent Outpatient Visits          2 days ago Controlled type 2 diabetes mellitus without complication, without long-term current use of insulin ([facility]  [facility]   1  month ago Controlled type 2 diabetes mellitus without complication, without long-term current use of insulin ([facility]  [facility]   6 months ago Annual physical exam   [facility]   1 year ago Acute non-recurrent frontal sinusitis   [facility] [place] 1 year ago Essential hypertension   [facility]      Future Appointments            In 3 weeks WaLoel DubonnetNP [facility]   In 1 month GiJerrol Banana MD [facility]           . magnesium oxide (MAG-OX) 400 MG tablet [Pharmacy Med Name: MAGNESIUM OXIDE 400 MG TABLET] 180 tablet 3    Sig: TAKE 1 TABLET BY MOUTH TWICE A DAY     Endocrinology:  Minerals - Magnesium Supplementation Passed - 01/19/2020  9:34 AM      Passed - Mg Level in normal range and within 360 days    Magnesium  Date Value Ref Range Status  11/27/2019 2.2 1.7 - 2.4 mg/dL Final    Comment:    Performed at [facility], [place] [address] [place], [place] [address]         Passed - Valid encounter within last 12 months    Recent Outpatient Visits          2 days ago Controlled type 2 diabetes mellitus without complication, without long-term current use of insulin [facility])   [facility]   1 month ago Controlled type 2 diabetes mellitus without complication, without long-term current use of insulin [facility])   [facility] Ban[facility] MD   6 months ago Annual physical exam   [facility] Jerrol Banana., MD   1 year ago Acute non-recurrent frontal sinusitis   [facility] [place], [place]   1 year ago Essential hypertension   [facility] Jerrol Banana., MD      Future Appointments            In 3 weeks Loel Dubonnet, NP  [facility], [facility]   In 1 month Jerrol Banana., MD [facility], [facility]

## 2020-01-19 NOTE — Telephone Encounter (Signed)
Requested medication (s) are due for refill today: yes  Requested medication (s) are on the active medication list: yes  Last refill:  12/13/19  Future visit scheduled: yes  Notes to clinic:  per active hx this prescription expired 12/02/19. Upon chart review, this is an active med. Was previously refused - Please review and refill if appropriate. Pt is due to f/u in 1 month.   Requested Prescriptions  Pending Prescriptions Disp Refills   metFORMIN (GLUCOPHAGE) 500 MG tablet [Pharmacy Med Name: METFORMIN HCL 500 MG TABLET] 180 tablet     Sig: TAKE 1 TABLET BY MOUTH TWICE A DAY WITH MEALS      Endocrinology:  Diabetes - Biguanides Failed - 01/19/2020  9:34 AM      Failed - HBA1C is between 0 and 7.9 and within 180 days    Hgb A1c MFr Bld  Date Value Ref Range Status  11/22/2019 11.4 (H) 4.8 - 5.6 % Final    Comment:    (NOTE) Pre diabetes:          5.7%-6.4% Diabetes:              >6.4% Glycemic control for   <7.0% adults with diabetes           Passed - Cr in normal range and within 360 days    Creat  Date Value Ref Range Status  05/23/2017 0.77 0.70 - 1.25 mg/dL Final    Comment:    For patients >76 years of age, the reference limit for Creatinine is approximately 13% higher for people identified as African-American. .    Creatinine, Ser  Date Value Ref Range Status  12/18/2019 0.97 0.76 - 1.27 mg/dL Final          Passed - eGFR in normal range and within 360 days    GFR, Est African American  Date Value Ref Range Status  05/23/2017 114 > OR = 60 mL/min/1.67m Final   GFR calc Af Amer  Date Value Ref Range Status  12/18/2019 96 >59 mL/min/1.73 Final    Comment:    **[facility] currently reports eGFR in compliance with the current**   recommendations of the [employer] [facility] will   update reporting as new guidelines are published from the NKF-ASN   Task force.    GFR, Est Non African American  Date Value Ref Range Status  05/23/2017 99 >  OR = 60 mL/min/1.714mFinal   GFR calc non Af Amer  Date Value Ref Range Status  12/18/2019 83 >59 mL/min/1.73 Final          Passed - Valid encounter within last 6 months    Recent Outpatient Visits           2 days ago Controlled type 2 diabetes mellitus without complication, without long-term current use of insulin ([facility]  [facility]   1 month ago Controlled type 2 diabetes mellitus without complication, without long-term current use of insulin ([facility]  [facility]   6 months ago Annual physical exam   [facility]   1 year ago Acute non-recurrent frontal sinusitis   [facility] [place] 1 year ago Essential hypertension   [facility]       Future Appointments             In 3  weeks Loel Dubonnet, NP [facility], [facility]   In 1 month Jerrol Banana., MD [facility], [facility]             Signed Prescriptions Disp Refills   magnesium oxide (MAG-OX) 400 MG tablet 180 tablet 3    Sig: TAKE 1 TABLET BY MOUTH TWICE A DAY      Endocrinology:  Minerals - Magnesium Supplementation Passed - 01/19/2020  9:34 AM      Passed - Mg Level in normal range and within 360 days    Magnesium  Date Value Ref Range Status  11/27/2019 2.2 1.7 - 2.4 mg/dL Final    Comment:    Performed at [facility], [place] [address] [place], [place] [address]          Passed - Valid encounter within last 12 months    Recent Outpatient Visits           2 days ago Controlled type 2 diabetes mellitus without complication, without long-term current use of insulin [facility])   [facility]   1 month ago Controlled type 2 diabetes mellitus without complication, without long-term current use of insulin [facility])   [facility] Jerrol Banana., MD   6 months ago Annual physical exam   [facility] Jerrol Banana., MD   1 year ago Acute non-recurrent frontal sinusitis   [facility] [place], [place]   1 year ago Essential hypertension   [facility] Jerrol Banana., MD       Future Appointments             In 3 weeks Loel Dubonnet, NP [facility]   In 1 month Jerrol Banana., MD [facility], [facility]

## 2020-01-22 ENCOUNTER — Other Ambulatory Visit: Payer: Self-pay

## 2020-01-22 ENCOUNTER — Encounter: Payer: No Typology Code available for payment source | Attending: Cardiovascular Disease | Admitting: *Deleted

## 2020-01-22 DIAGNOSIS — Z6835 Body mass index (BMI) 35.0-35.9, adult: Secondary | ICD-10-CM | POA: Insufficient documentation

## 2020-01-22 DIAGNOSIS — M1990 Unspecified osteoarthritis, unspecified site: Secondary | ICD-10-CM | POA: Insufficient documentation

## 2020-01-22 DIAGNOSIS — I2510 Atherosclerotic heart disease of native coronary artery without angina pectoris: Secondary | ICD-10-CM | POA: Diagnosis not present

## 2020-01-22 DIAGNOSIS — E669 Obesity, unspecified: Secondary | ICD-10-CM | POA: Insufficient documentation

## 2020-01-22 DIAGNOSIS — E785 Hyperlipidemia, unspecified: Secondary | ICD-10-CM | POA: Insufficient documentation

## 2020-01-22 DIAGNOSIS — Z794 Long term (current) use of insulin: Secondary | ICD-10-CM | POA: Diagnosis not present

## 2020-01-22 DIAGNOSIS — E559 Vitamin D deficiency, unspecified: Secondary | ICD-10-CM | POA: Insufficient documentation

## 2020-01-22 DIAGNOSIS — I10 Essential (primary) hypertension: Secondary | ICD-10-CM | POA: Diagnosis not present

## 2020-01-22 DIAGNOSIS — R011 Cardiac murmur, unspecified: Secondary | ICD-10-CM | POA: Diagnosis not present

## 2020-01-22 DIAGNOSIS — Z79899 Other long term (current) drug therapy: Secondary | ICD-10-CM | POA: Diagnosis not present

## 2020-01-22 DIAGNOSIS — M109 Gout, unspecified: Secondary | ICD-10-CM | POA: Diagnosis not present

## 2020-01-22 DIAGNOSIS — Z951 Presence of aortocoronary bypass graft: Secondary | ICD-10-CM | POA: Diagnosis present

## 2020-01-22 NOTE — Progress Notes (Signed)
Daily Session Note  Patient Details  Name: Jeffery West MRN: [id number] Date of Birth: 1956/03/21 Referring Provider:     Cardiac Rehab from 01/03/2020 in [facility] Cardiac and Pulmonary Rehab  Referring Provider [name], MD      Encounter Date: 01/22/2020  Check In:  Session Check In - 01/22/20 0839      Check-In   Supervising physician immediately available to respond to emergencies See telemetry face sheet for immediately available ER MD    Location [facility]-Cardiac & Pulmonary Rehab    Staff Present Heath Lark, RN, BSN, Jacklynn Bue, MS Exercise Physiologist;Amanda Oletta Darter, [place], ACSM CEP, Exercise Physiologist    Virtual Visit No    Medication changes reported     No    Fall or balance concerns reported    No    Warm-up and Cool-down Performed on first and last piece of equipment    Resistance Training Performed Yes    VAD Patient? No    PAD/SET Patient? No      Pain Assessment   Currently in Pain? No/denies              Social History   Tobacco Use  Smoking Status Never Smoker  Smokeless Tobacco Never Used    Goals Met:  Independence with exercise equipment Exercise tolerated well No report of cardiac concerns or symptoms  Goals Unmet:  Not Applicable  Comments: Pt able to follow exercise prescription today without complaint.  Will continue to monitor for progression.    Dr. Emily Filbert is Medical Director for [place] and [facility].

## 2020-01-24 LAB — HM DIABETES EYE EXAM

## 2020-01-25 ENCOUNTER — Ambulatory Visit: Payer: Self-pay | Admitting: *Deleted

## 2020-01-25 NOTE — Telephone Encounter (Signed)
  Caller asked if he should use remaining dose of tresciba after medication change from PCP before ordering levimir insulin. Unable to answer question for patient and recommended to continue what PCP has ordered now. Please advise. Care advise given. Patient verbalized understanding of care advise and to contact PCP on Monday for further clarification of medications.   Reason for Disposition . [1] Caller has NON-URGENT medicine question about med that PCP prescribed AND [2] triager unable to answer question  Answer Assessment - Initial Assessment Questions 1. NAME of MEDICATION: "What medicine are you calling about?"     levimir insulin and tresciba 2. QUESTION: "What is your question?" (e.g., medication refill, side effect)     Should I use up my tresciba  Before ordering more levimir insulin 3. PRESCRIBING HCP: "Who prescribed it?" Reason: if prescribed by specialist, call should be referred to that group.     DtRosanna Randy 4. SYMPTOMS: "Do you have any symptoms?"     No  5. SEVERITY: If symptoms are present, ask "Are they mild, moderate or severe?"     na  Protocols used: MEDICATION QUESTION CALL-A-AH

## 2020-01-28 NOTE — Telephone Encounter (Signed)
Patient advised.

## 2020-01-28 NOTE — Telephone Encounter (Signed)
OK to use Tresiba in place of Levemir at same daily dose.

## 2020-01-29 ENCOUNTER — Encounter: Payer: No Typology Code available for payment source | Admitting: *Deleted

## 2020-01-29 ENCOUNTER — Other Ambulatory Visit: Payer: Self-pay

## 2020-01-29 DIAGNOSIS — Z951 Presence of aortocoronary bypass graft: Secondary | ICD-10-CM | POA: Diagnosis not present

## 2020-01-29 NOTE — Progress Notes (Signed)
Daily Session Note  Patient Details  Name: Jeffery West MRN: [id number] Date of Birth: 1955-12-27 Referring Provider:     Cardiac Rehab from 01/03/2020 in [facility] Cardiac and Pulmonary Rehab  Referring Provider [name], MD      Encounter Date: 01/29/2020  Check In:  Session Check In - 01/29/20 0837      Check-In   Supervising physician immediately available to respond to emergencies See telemetry face sheet for immediately available ER MD    Location [facility]-Cardiac & Pulmonary Rehab    Staff Present Heath Lark, RN, BSN, Jacklynn Bue, MS Exercise Physiologist;Amanda Oletta Darter, [place], ACSM CEP, Exercise Physiologist    Virtual Visit No    Medication changes reported     No    Fall or balance concerns reported    No    Warm-up and Cool-down Performed on first and last piece of equipment    Resistance Training Performed Yes    VAD Patient? No    PAD/SET Patient? No      Pain Assessment   Currently in Pain? No/denies              Social History   Tobacco Use  Smoking Status Never Smoker  Smokeless Tobacco Never Used    Goals Met:  Independence with exercise equipment Exercise tolerated well No report of cardiac concerns or symptoms  Goals Unmet:  Not Applicable  Comments: Pt able to follow exercise prescription today without complaint.  Will continue to monitor for progression.    Dr. Emily Filbert is Medical Director for [place] and [facility].

## 2020-01-30 ENCOUNTER — Encounter: Payer: Self-pay | Admitting: *Deleted

## 2020-01-30 DIAGNOSIS — Z951 Presence of aortocoronary bypass graft: Secondary | ICD-10-CM

## 2020-01-30 NOTE — Progress Notes (Signed)
Cardiac Individual Treatment Plan  Patient Details  Name: Jeffery West MRN: [id number] Date of Birth: 23-Dec-1955 Referring Provider:     Cardiac Rehab from 01/03/2020 in [facility] Cardiac and Pulmonary Rehab  Referring Provider [name], MD      Initial Encounter Date:    Cardiac Rehab from 01/03/2020 in [facility] Cardiac and Pulmonary Rehab  Date 01/03/20      Visit Diagnosis: S/P CABG x 2  Patient's Home Medications on Admission:  Current Outpatient Medications:  .  acetaminophen (TYLENOL) 500 MG tablet, Take 1,000 mg by mouth every 6 (six) hours as needed for moderate pain or headache., Disp: , Rfl:  .  albuterol (PROAIR HFA) 108 (90 Base) MCG/ACT inhaler, Inhale 1 puff into the lungs every 6 (six) hours as needed for wheezing or shortness of breath., Disp: 18 g, Rfl: 3 .  allopurinol (ZYLOPRIM) 300 MG tablet, Take 1 tablet (300 mg total) by mouth daily., Disp: 90 tablet, Rfl: 3 .  aspirin EC 325 MG EC tablet, Take 1 tablet (325 mg total) by mouth daily., Disp: , Rfl:  .  BD PEN NEEDLE NANO 2ND GEN 32G X 4 MM MISC, USE AS DIRECTED (Patient not taking: Reported on 12/31/2019), Disp: 100 each, Rfl: 8 .  Cholecalciferol (VITAMIN D) 2000 units CAPS, Take 2,000 Units by mouth every evening. , Disp: , Rfl:  .  ezetimibe (ZETIA) 10 MG tablet, TAKE 1 TABLET BY MOUTH EVERY DAY, Disp: 90 tablet, Rfl: 3 .  ezetimibe (ZETIA) 10 MG tablet, Take 10 mg by mouth daily., Disp: , Rfl:  .  glucose blood (ONETOUCH VERIO) test strip, Use as instructed; BID, Disp: 100 each, Rfl: 12 .  insulin aspart (NOVOLOG) 100 UNIT/ML injection, Inject 6 Units into the skin every evening., Disp: 10 mL, Rfl: 1 .  insulin detemir (LEVEMIR) 100 UNIT/ML injection, Inject 0.5 mLs (50 Units total) into the skin daily., Disp: 10 mL, Rfl: 3 .  Insulin Syringe-Needle U-100 30G X 1/2" 1 ML MISC, Use with insulin 3 times daily, Disp: 270 each, Rfl: 3 .  losartan (COZAAR) 50 MG tablet, Take 50 mg by mouth daily., Disp: , Rfl:  .   losartan (COZAAR) 50 MG tablet, TAKE 1 TABLET BY MOUTH EVERY DAY, Disp: 90 tablet, Rfl: 2 .  magnesium oxide (MAG-OX) 400 MG tablet, Take 400 mg by mouth 2 (two) times daily. , Disp: , Rfl:  .  magnesium oxide (MAG-OX) 400 MG tablet, TAKE 1 TABLET BY MOUTH TWICE A DAY, Disp: 180 tablet, Rfl: 3 .  metFORMIN (GLUCOPHAGE) 1000 MG tablet, Take 1 tablet (1,000 mg total) by mouth 2 (two) times daily with a meal., Disp: 60 tablet, Rfl: 1 .  metoprolol tartrate (LOPRESSOR) 25 MG tablet, Take 1 tablet (25 mg total) by mouth 2 (two) times daily., Disp: 90 tablet, Rfl: 1 .  nystatin cream (MYCOSTATIN), Apply 1 application topically 2 (two) times daily., Disp: 30 g, Rfl: 6 .  OneTouch Delica Lancets 03T MISC, Use 3 times a day, Disp: 100 each, Rfl: 12 .  rosuvastatin (CRESTOR) 5 MG tablet, Take 1 tablet (5 mg total) by mouth daily., Disp: 90 tablet, Rfl: 3 .  tamsulosin (FLOMAX) 0.4 MG CAPS capsule, TAKE 1 CAPSULE BY MOUTH EVERY DAY, Disp: 90 capsule, Rfl: 3  Past Medical History: Past Medical History:  Diagnosis Date  . Coronary artery disease   . Diabetes mellitus without complication ([place])   . Erectile dysfunction   . Gout   . Heart murmur   .  Hyperlipidemia   . Hypertension   . Obesity   . Osteoarthritis   . Vitamin D deficiency     Tobacco Use: Social History   Tobacco Use  Smoking Status Never Smoker  Smokeless Tobacco Never Used    Labs: Recent Review Flowsheet Data    Labs for ITP Cardiac and Pulmonary Rehab Latest Ref Rng & Units 11/26/2019 11/26/2019 11/27/2019 11/27/2019 12/18/2019   Cholestrol 100 - 199 mg/dL - - - - 108   LDLCALC 0 - 99 mg/dL - - - - 53   HDL >39 mg/dL - - - - 35(L)   Trlycerides 0 - 149 mg/dL - - - - 110   Hemoglobin A1c 4.8 - 5.6 % - - - - -   PHART 7.35 - 7.45 7.242(L) 7.309(L) 7.293(L) 7.289(L) -   PCO2ART 32 - 48 mmHg 55.1(H) 50.7(H) 49.7(H) 54.8(H) -   HCO3 20.0 - 28.0 mmol/L 23.9 25.5 24.1 26.2 -   TCO2 22 - 32 mmol/L '26 27 26 28 ' -   ACIDBASEDEF 0.0 - 2.0  mmol/L 4.0(H) 1.0 3.0(H) 1.0 -   O2SAT % 99.0 99.0 98.0 97.0 -       Exercise Target Goals: Exercise Program Goal: Individual exercise prescription set using results from initial 6 min walk test and THRR while considering  patient's activity barriers and safety.   Exercise Prescription Goal: Initial exercise prescription builds to 30-45 minutes a day of aerobic activity, 2-3 days per week.  Home exercise guidelines will be given to patient during program as part of exercise prescription that the participant will acknowledge.   Education: Aerobic Exercise & Resistance Training: - Gives group verbal and written instruction on the various components of exercise. Focuses on aerobic and resistive training programs and the benefits of this training and how to safely progress through these programs..   Education: Exercise & Equipment Safety: - Individual verbal instruction and demonstration of equipment use and safety with use of the equipment.   Cardiac Rehab from 01/17/2020 in [facility] Cardiac and Pulmonary Rehab  Date 12/31/19  Educator [facility]  Instruction Review Code 1- Verbalizes Understanding      Education: Exercise Physiology & General Exercise Guidelines: - Group verbal and written instruction with models to review the exercise physiology of the cardiovascular system and associated critical values. Provides general exercise guidelines with specific guidelines to those with heart or lung disease.    Education: Flexibility, Balance, Mind/Body Relaxation: Provides group verbal/written instruction on the benefits of flexibility and balance training, including mind/body exercise modes such as yoga, pilates and tai chi.  Demonstration and skill practice provided.   Activity Barriers & Risk Stratification:   6 Minute Walk:  6 Minute Walk    Row Name 01/03/20 1315         6 Minute Walk   Phase Initial     Distance 1175 feet     Walk Time 6 minutes     # of Rest Breaks 0     MPH 2.22      METS 2.69     RPE 13     Perceived Dyspnea  0     VO2 Peak 9.42     Symptoms No     Resting HR 67 bpm     Resting BP 128/66     Resting Oxygen Saturation  96 %     Exercise Oxygen Saturation  during 6 min walk 95 %     Max Ex. HR 91 bpm  Max Ex. BP 150/68     2 Minute Post BP 122/64            Oxygen Initial Assessment:   Oxygen Re-Evaluation:   Oxygen Discharge (Final Oxygen Re-Evaluation):   Initial Exercise Prescription:  Initial Exercise Prescription - 01/03/20 1300      Date of Initial Exercise RX and Referring Provider   Date 01/03/20    Referring Provider [name], MD      Treadmill   MPH 1.9    Grade 0.5    Minutes 15    METs 2.59      REL-XR   Level 2    Speed 50    Minutes 15    METs 2.6      T5 Nustep   Level 2    SPM 80    Minutes 15    METs 2.6      Prescription Details   Frequency (times per week) 2    Duration Progress to 30 minutes of continuous aerobic without signs/symptoms of physical distress      Intensity   THRR 40-80% of Max Heartrate 103-139    Ratings of Perceived Exertion 11-13    Perceived Dyspnea 0-4      Progression   Progression Continue to progress workloads to maintain intensity without signs/symptoms of physical distress.      Resistance Training   Training Prescription Yes    Weight 3 lb    Reps 10-15           Perform Capillary Blood Glucose checks as needed.  Exercise Prescription Changes:  Exercise Prescription Changes    Row Name 01/03/20 1300 01/21/20 1700           Response to Exercise   Blood Pressure (Admit) 128/66 142/72      Blood Pressure (Exercise) 150/68 144/68      Blood Pressure (Exit) 122/64 120/64      Heart Rate (Admit) 67 bpm 71 bpm      Heart Rate (Exercise) 91 bpm 91 bpm      Heart Rate (Exit) 67 bpm 63 bpm      Oxygen Saturation (Admit) 96 % --      Oxygen Saturation (Exercise) 95 % --      Oxygen Saturation (Exit) 97 % --      Rating of Perceived Exertion  (Exercise) 13 12      Perceived Dyspnea (Exercise) 0 --      Symptoms None none      Comments Walk Test Results --      Duration -- Continue with 30 min of aerobic exercise without signs/symptoms of physical distress.      Intensity -- THRR unchanged        Progression   Progression -- Continue to progress workloads to maintain intensity without signs/symptoms of physical distress.      Average METs -- 2.5        Resistance Training   Training Prescription -- Yes      Weight -- 3 lb      Reps -- 10-15        Interval Training   Interval Training No No        Treadmill   MPH -- 1.9      Grade -- 0.5      Minutes -- 15      METs -- 2.6        T5 Nustep   Level -- 2  SPM -- 80      Minutes -- 15      METs -- 2.5             Exercise Comments:   Exercise Goals and Review:  Exercise Goals    Row Name 01/03/20 1044             Exercise Goals   Increase Physical Activity Yes       Intervention Provide advice, education, support and counseling about physical activity/exercise needs.;Develop an individualized exercise prescription for aerobic and resistive training based on initial evaluation findings, risk stratification, comorbidities and participant's personal goals.       Expected Outcomes Short Term: Attend rehab on a regular basis to increase amount of physical activity.;Long Term: Add in home exercise to make exercise part of routine and to increase amount of physical activity.;Long Term: Exercising regularly at least 3-5 days a week.       Increase Strength and Stamina Yes       Intervention Provide advice, education, support and counseling about physical activity/exercise needs.;Develop an individualized exercise prescription for aerobic and resistive training based on initial evaluation findings, risk stratification, comorbidities and participant's personal goals.       Expected Outcomes Short Term: Increase workloads from initial exercise prescription for  resistance, speed, and METs.;Short Term: Perform resistance training exercises routinely during rehab and add in resistance training at home;Long Term: Improve cardiorespiratory fitness, muscular endurance and strength as measured by increased METs and functional capacity (6MWT)       Able to understand and use rate of perceived exertion (RPE) scale Yes       Intervention Provide education and explanation on how to use RPE scale       Expected Outcomes Short Term: Able to use RPE daily in rehab to express subjective intensity level;Long Term:  Able to use RPE to guide intensity level when exercising independently       Able to understand and use Dyspnea scale Yes       Intervention Provide education and explanation on how to use Dyspnea scale       Expected Outcomes Short Term: Able to use Dyspnea scale daily in rehab to express subjective sense of shortness of breath during exertion;Long Term: Able to use Dyspnea scale to guide intensity level when exercising independently       Knowledge and understanding of Target Heart Rate Range (THRR) Yes       Intervention Provide education and explanation of THRR including how the numbers were predicted and where they are located for reference       Expected Outcomes Short Term: Able to state/look up THRR;Short Term: Able to use daily as guideline for intensity in rehab;Long Term: Able to use THRR to govern intensity when exercising independently       Able to check pulse independently Yes       Intervention Provide education and demonstration on how to check pulse in carotid and radial arteries.;Review the importance of being able to check your own pulse for safety during independent exercise       Expected Outcomes Short Term: Able to explain why pulse checking is important during independent exercise;Long Term: Able to check pulse independently and accurately       Understanding of Exercise Prescription Yes       Intervention Provide education, explanation,  and written materials on patient's individual exercise prescription       Expected Outcomes Short Term: Able to explain  program exercise prescription;Long Term: Able to explain home exercise prescription to exercise independently              Exercise Goals Re-Evaluation :  Exercise Goals Re-Evaluation    Row Name 01/21/20 1750             Exercise Goal Re-Evaluation   Exercise Goals Review Increase Physical Activity;Increase Strength and Stamina;Understanding of Exercise Prescription       Comments Jeani Hawking is tolerating exercise well in his first two weeks of exercise. Staff will monitor progress.       Expected Outcomes Short:  continue to attend consistently Long:  build strength and stamina              Discharge Exercise Prescription (Final Exercise Prescription Changes):  Exercise Prescription Changes - 01/21/20 1700      Response to Exercise   Blood Pressure (Admit) 142/72    Blood Pressure (Exercise) 144/68    Blood Pressure (Exit) 120/64    Heart Rate (Admit) 71 bpm    Heart Rate (Exercise) 91 bpm    Heart Rate (Exit) 63 bpm    Rating of Perceived Exertion (Exercise) 12    Symptoms none    Duration Continue with 30 min of aerobic exercise without signs/symptoms of physical distress.    Intensity THRR unchanged      Progression   Progression Continue to progress workloads to maintain intensity without signs/symptoms of physical distress.    Average METs 2.5      Resistance Training   Training Prescription Yes    Weight 3 lb    Reps 10-15      Interval Training   Interval Training No      Treadmill   MPH 1.9    Grade 0.5    Minutes 15    METs 2.6      T5 Nustep   Level 2    SPM 80    Minutes 15    METs 2.5           Nutrition:  Target Goals: Understanding of nutrition guidelines, daily intake of sodium <1546m, cholesterol <2051m calories 30% from fat and 7% or less from saturated fats, daily to have 5 or more servings of fruits and  vegetables.  Education: Controlling Sodium/Reading Food Labels -Group verbal and written material supporting the discussion of sodium use in heart healthy nutrition. Review and explanation with models, verbal and written materials for utilization of the food label.   Education: General Nutrition Guidelines/Fats and Fiber: -Group instruction provided by verbal, written material, models and posters to present the general guidelines for heart healthy nutrition. Gives an explanation and review of dietary fats and fiber.   Biometrics:  Pre Biometrics - 01/03/20 1027      Pre Biometrics   Height 5' 10.75" (1.797 m)    Weight 253 lb 11.2 oz (115.1 kg)    BMI (Calculated) 35.64    Single Leg Stand 2.44 seconds            Nutrition Therapy Plan and Nutrition Goals:  Nutrition Therapy & Goals - 01/22/20 0755      Nutrition Therapy   Diet Heart healthy, low Na, T2DM    Drug/Food Interactions Food/Disease   Insulin - Levemir and Novolog   Protein (specify units) 95-100g    Fiber 30 grams    Whole Grain Foods 3 servings    Saturated Fats 12 max. grams    Fruits and Vegetables 5 servings/day  Sodium 1.5 grams      Personal Nutrition Goals   Nutrition Goal ST: utilize MyPlate to include more whole grains/ starchy vegetables he enjoys, use spreadable margarine vs stick margarine, continue with current chnages LT: continue with heart healthy changes and lower BG    Comments CHO 60g/meal - 200g/day. Trying to avoid starchy vegetables and grains. B: 2 pieces of whole wheat toast with yogurt L: tuna cottage cheese and avocado and salads or leftover D: chicken and vegetables (heart healthy cooking methods) - margarine (stick) - small amount. BG: ~150-170. Levemir and novolog, recently lowered insulin. One at dinner, not at breakfast and lunch. Discussed heart healthy eating and T2DM friendly eating - discussed whole grains and MyPlate      Intervention Plan   Intervention Prescribe, educate  and counsel regarding individualized specific dietary modifications aiming towards targeted core components such as weight, hypertension, lipid management, diabetes, heart failure and other comorbidities.;Nutrition handout(s) given to patient.    Expected Outcomes Short Term Goal: Understand basic principles of dietary content, such as calories, fat, sodium, cholesterol and nutrients.;Short Term Goal: A plan has been developed with personal nutrition goals set during dietitian appointment.;Long Term Goal: Adherence to prescribed nutrition plan.           Nutrition Assessments:  Nutrition Assessments - 01/03/20 1039      MEDFICTS Scores   Pre Score 39           MEDIFICTS Score Key:          ?70 Need to make dietary changes          40-70 Heart Healthy Diet         ? 40 Therapeutic Level Cholesterol Diet  Nutrition Goals Re-Evaluation:   Nutrition Goals Discharge (Final Nutrition Goals Re-Evaluation):   Psychosocial: Target Goals: Acknowledge presence or absence of significant depression and/or stress, maximize coping skills, provide positive support system. Participant is able to verbalize types and ability to use techniques and skills needed for reducing stress and depression.   Education: Depression - Provides group verbal and written instruction on the correlation between heart/lung disease and depressed mood, treatment options, and the stigmas associated with seeking treatment.   Education: Sleep Hygiene -Provides group verbal and written instruction about how sleep can affect your health.  Define sleep hygiene, discuss sleep cycles and impact of sleep habits. Review good sleep hygiene tips.     Education: Stress and Anxiety: - Provides group verbal and written instruction about the health risks of elevated stress and causes of high stress.  Discuss the correlation between heart/lung disease and anxiety and treatment options. Review healthy ways to manage with stress and  anxiety.    Initial Review & Psychosocial Screening:  Initial Psych Review & Screening - 12/31/19 1434      Initial Review   Current issues with None Identified      Family Dynamics   Good Support System? Yes    Comments He can look to his wife and son.      Barriers   Psychosocial barriers to participate in program The patient should benefit from training in stress management and relaxation.;There are no identifiable barriers or psychosocial needs.      Screening Interventions   Interventions Encouraged to exercise    Expected Outcomes Short Term goal: Utilizing psychosocial counselor, staff and physician to assist with identification of specific Stressors or current issues interfering with healing process. Setting desired goal for each stressor or current issue identified.;Long Term  Goal: Stressors or current issues are controlled or eliminated.;Short Term goal: Identification and review with participant of any Quality of Life or Depression concerns found by scoring the questionnaire.;Long Term goal: The participant improves quality of Life and PHQ9 Scores as seen by post scores and/or verbalization of changes           Quality of Life Scores:   Quality of Life - 01/03/20 1043      Quality of Life   Select Quality of Life      Quality of Life Scores   Health/Function Pre 21.27 %    Socioeconomic Pre 19.36 %    Psych/Spiritual Pre 16.79 %    Family Pre 24.9 %    GLOBAL Pre 20.49 %          Scores of 19 and below usually indicate a poorer quality of life in these areas.  A difference of  2-3 points is a clinically meaningful difference.  A difference of 2-3 points in the total score of the Quality of Life Index has been associated with significant improvement in overall quality of life, self-image, physical symptoms, and general health in studies assessing change in quality of life.  PHQ-9: Recent Review Flowsheet Data    Depression screen [facility] 2/9 01/03/2020 07/16/2019  06/28/2017 02/14/2017   Decreased Interest 1 0 0 0   Down, Depressed, Hopeless 1 0 0 0   PHQ - 2 Score 2 0 0 0   Altered sleeping 1 0 0 -   Tired, decreased energy 1 0 0 -   Change in appetite 0 0 0 -   Feeling bad or failure about yourself  0 0 0 -   Trouble concentrating 1 0 0 -   Moving slowly or fidgety/restless 0 0 0 -   Suicidal thoughts 0 0 0 -   PHQ-9 Score 5 0 0 -   Difficult doing work/chores Not difficult at all Not difficult at all Not difficult at all -     Interpretation of Total Score  Total Score Depression Severity:  1-4 = Minimal depression, 5-9 = Mild depression, 10-14 = Moderate depression, 15-19 = Moderately severe depression, 20-27 = Severe depression   Psychosocial Evaluation and Intervention:  Psychosocial Evaluation - 12/31/19 1435      Psychosocial Evaluation & Interventions   Interventions Encouraged to exercise with the program and follow exercise prescription    Comments He can look to his wife and son. He has no other concerns other than everyday life.    Expected Outcomes Short: Exercise regularly to support mental health and notify staff of any changes. Long: maintain mental health and well being through teaching of rehab or prescribed medications independently.    Continue Psychosocial Services  Follow up required by staff           Psychosocial Re-Evaluation:   Psychosocial Discharge (Final Psychosocial Re-Evaluation):   Vocational Rehabilitation: Provide vocational rehab assistance to qualifying candidates.   Vocational Rehab Evaluation & Intervention:   Education: Education Goals: Education classes will be provided on a variety of topics geared toward better understanding of heart health and risk factor modification. Participant will state understanding/return demonstration of topics presented as noted by education test scores.  Learning Barriers/Preferences:  Learning Barriers/Preferences - 12/31/19 1432      Learning  Barriers/Preferences   Learning Barriers None    Learning Preferences None           General Cardiac Education Topics:  AED/CPR: - Group verbal  and written instruction with the use of models to demonstrate the basic use of the AED with the basic ABC's of resuscitation.   Anatomy & Physiology of the Heart: - Group verbal and written instruction and models provide basic cardiac anatomy and physiology, with the coronary electrical and arterial systems. Review of Valvular disease and Heart Failure   Cardiac Procedures: - Group verbal and written instruction to review commonly prescribed medications for heart disease. Reviews the medication, class of the drug, and side effects. Includes the steps to properly store meds and maintain the prescription regimen. (beta blockers and nitrates)   Cardiac Medications I: - Group verbal and written instruction to review commonly prescribed medications for heart disease. Reviews the medication, class of the drug, and side effects. Includes the steps to properly store meds and maintain the prescription regimen.   Cardiac Rehab from 01/17/2020 in [facility] Cardiac and Pulmonary Rehab  Date 01/17/20  Educator [facility]  Instruction Review Code 1- Verbalizes Understanding      Cardiac Medications II: -Group verbal and written instruction to review commonly prescribed medications for heart disease. Reviews the medication, class of the drug, and side effects. (all other drug classes)    Go Sex-Intimacy & Heart Disease, Get SMART - Goal Setting: - Group verbal and written instruction through game format to discuss heart disease and the return to sexual intimacy. Provides group verbal and written material to discuss and apply goal setting through the application of the S.M.A.R.T. Method.   Other Matters of the Heart: - Provides group verbal, written materials and models to describe Stable Angina and Peripheral Artery. Includes description of the disease process  and treatment options available to the cardiac patient.   Infection Prevention: - Provides verbal and written material to individual with discussion of infection control including proper hand washing and proper equipment cleaning during exercise session.   Cardiac Rehab from 01/17/2020 in [facility] Cardiac and Pulmonary Rehab  Date 12/31/19  Educator [facility]  Instruction Review Code 1- Verbalizes Understanding      Falls Prevention: - Provides verbal and written material to individual with discussion of falls prevention and safety.   Cardiac Rehab from 01/17/2020 in [facility] Cardiac and Pulmonary Rehab  Date 12/31/19  Educator [facility]  Instruction Review Code 1- Verbalizes Understanding      Other: -Provides group and verbal instruction on various topics (see comments)   Knowledge Questionnaire Score:  Knowledge Questionnaire Score - 01/03/20 1038      Knowledge Questionnaire Score   Pre Score 20/26: Heart Disease,Nutrition, Exercise           Core Components/Risk Factors/Patient Goals at Admission:  Personal Goals and Risk Factors at Admission - 01/03/20 1045      Core Components/Risk Factors/Patient Goals on Admission    Weight Management Yes;Obesity;Weight Loss    Intervention Weight Management: Provide education and appropriate resources to help participant work on and attain dietary goals.;Weight Management: Develop a combined nutrition and exercise program designed to reach desired caloric intake, while maintaining appropriate intake of nutrient and fiber, sodium and fats, and appropriate energy expenditure required for the weight goal.;Weight Management/Obesity: Establish reasonable short term and long term weight goals.;Obesity: Provide education and appropriate resources to help participant work on and attain dietary goals.    Admit Weight 253 lb 11.2 oz (115.1 kg)    Goal Weight: Short Term 248 lb (112.5 kg)    Goal Weight: Long Term 243 lb (110.2 kg)    Expected Outcomes Short Term:  Continue  to assess and modify interventions until short term weight is achieved;Long Term: Adherence to nutrition and physical activity/exercise program aimed toward attainment of established weight goal;Weight Maintenance: Understanding of the daily nutrition guidelines, which includes 25-35% calories from fat, 7% or less cal from saturated fats, less than 258m cholesterol, less than 1.5gm of sodium, & 5 or more servings of fruits and vegetables daily;Weight Loss: Understanding of general recommendations for a balanced deficit meal plan, which promotes 1-2 lb weight loss per week and includes a negative energy balance of [phone] kcal/d;Understanding recommendations for meals to include 15-35% energy as protein, 25-35% energy from fat, 35-60% energy from carbohydrates, less than 2042mof dietary cholesterol, 20-35 gm of total fiber daily;Understanding of distribution of calorie intake throughout the day with the consumption of 4-5 meals/snacks    Diabetes Yes    Intervention Provide education about signs/symptoms and action to take for hypo/hyperglycemia.;Provide education about proper nutrition, including hydration, and aerobic/resistive exercise prescription along with prescribed medications to achieve blood glucose in normal ranges: Fasting glucose 65-99 mg/dL    Expected Outcomes Short Term: Participant verbalizes understanding of the signs/symptoms and immediate care of hyper/hypoglycemia, proper foot care and importance of medication, aerobic/resistive exercise and nutrition plan for blood glucose control.;Long Term: Attainment of HbA1C < 7%.    Hypertension Yes    Intervention Provide education on lifestyle modifcations including regular physical activity/exercise, weight management, moderate sodium restriction and increased consumption of fresh fruit, vegetables, and low fat dairy, alcohol moderation, and smoking cessation.;Monitor prescription use compliance.    Expected Outcomes Short Term:  Continued assessment and intervention until BP is < 140/9064mG in hypertensive participants. < 130/8m79m in hypertensive participants with diabetes, heart failure or chronic kidney disease.;Long Term: Maintenance of blood pressure at goal levels.    Lipids Yes    Intervention Provide education and support for participant on nutrition & aerobic/resistive exercise along with prescribed medications to achieve LDL <70mg102mL >40mg.53mExpected Outcomes Short Term: Participant states understanding of desired cholesterol values and is compliant with medications prescribed. Participant is following exercise prescription and nutrition guidelines.;Long Term: Cholesterol controlled with medications as prescribed, with individualized exercise RX and with personalized nutrition plan. Value goals: LDL < 70mg, 68m> 40 mg.           Education:Diabetes - Individual verbal and written instruction to review signs/symptoms of diabetes, desired ranges of glucose level fasting, after meals and with exercise. Acknowledge that pre and post exercise glucose checks will be done for 3 sessions at entry of program.   Cardiac Rehab from 01/17/2020 in [facility] and Pulmonary Rehab  Date 12/31/19  Educator [facility] Review Code 1- Verbalizes Understanding      Education: Know Your Numbers and Risk Factors: -Group verbal and written instruction about important numbers in your health.  Discussion of what are risk factors and how they play a role in the disease process.  Review of Cholesterol, Blood Pressure, Diabetes, and BMI and the role they play in your overall health.   Core Components/Risk Factors/Patient Goals Review:    Core Components/Risk Factors/Patient Goals at Discharge (Final Review):    ITP Comments:  ITP Comments    Row Name 12/31/19 1437 01/03/20 1011 01/30/20 0607       ITP Comments Virtual Visit completed. Patient informed on EP and RD appointment and 6 Minute walk test. Patient also  informed of patient health questionnaires on My Chart. Patient Verbalizes understanding. Visit diagnosis can be  found in [facility] 11/26/2019. Completed 6MWT and gym orientation. Initial ITP created and sent for review to Dr. Emily Filbert, Medical Director. 30 Day review completed. Medical Director ITP review done, changes made as directed, and signed approval by Medical Director.            Comments:

## 2020-01-31 ENCOUNTER — Encounter: Payer: No Typology Code available for payment source | Admitting: *Deleted

## 2020-01-31 ENCOUNTER — Other Ambulatory Visit: Payer: Self-pay

## 2020-01-31 DIAGNOSIS — Z951 Presence of aortocoronary bypass graft: Secondary | ICD-10-CM

## 2020-01-31 NOTE — Progress Notes (Signed)
Daily Session Note  Patient Details  Name: Jeffery West MRN: [id number] Date of Birth: 30-Jun-1955 Referring Provider:     Cardiac Rehab from 01/03/2020 in [facility] Cardiac and Pulmonary Rehab  Referring Provider [name], MD      Encounter Date: 01/31/2020  Check In:  Session Check In - 01/31/20 0842      Check-In   Supervising physician immediately available to respond to emergencies See telemetry face sheet for immediately available ER MD    Location [facility]-Cardiac & Pulmonary Rehab    Staff Present Renita Papa, RN BSN;Melissa Caiola RDN, Rowe Pavy, BA, ACSM CEP, Exercise Physiologist    Virtual Visit No    Medication changes reported     No    Fall or balance concerns reported    No    Warm-up and Cool-down Performed on first and last piece of equipment    Resistance Training Performed Yes    VAD Patient? No    PAD/SET Patient? No      Pain Assessment   Currently in Pain? No/denies              Social History   Tobacco Use  Smoking Status Never Smoker  Smokeless Tobacco Never Used    Goals Met:  Independence with exercise equipment Exercise tolerated well No report of cardiac concerns or symptoms Strength training completed today  Goals Unmet:  Not Applicable  Comments: Pt able to follow exercise prescription today without complaint.  Will continue to monitor for progression.    Dr. Emily Filbert is Medical Director for [place] and [facility].

## 2020-02-05 ENCOUNTER — Encounter: Payer: No Typology Code available for payment source | Admitting: *Deleted

## 2020-02-05 ENCOUNTER — Other Ambulatory Visit: Payer: Self-pay

## 2020-02-05 DIAGNOSIS — Z951 Presence of aortocoronary bypass graft: Secondary | ICD-10-CM

## 2020-02-05 NOTE — Progress Notes (Signed)
Daily Session Note  Patient Details  Name: Jeffery West MRN: [id number] Date of Birth: 03-Jul-1955 Referring Provider:     Cardiac Rehab from 01/03/2020 in [facility] Cardiac and Pulmonary Rehab  Referring Provider [name], MD      Encounter Date: 02/05/2020  Check In:  Session Check In - 02/05/20 0815      Check-In   Supervising physician immediately available to respond to emergencies See telemetry face sheet for immediately available ER MD    Location [facility]-Cardiac & Pulmonary Rehab    Staff Present Heath Lark, RN, BSN, Jacklynn Bue, MS Exercise Physiologist;Joseph Tessie Fass RCP,RRT,BSRT    Virtual Visit No    Medication changes reported     No    Fall or balance concerns reported    No    Warm-up and Cool-down Performed on first and last piece of equipment    Resistance Training Performed Yes    VAD Patient? No    PAD/SET Patient? No      Pain Assessment   Currently in Pain? No/denies              Social History   Tobacco Use  Smoking Status Never Smoker  Smokeless Tobacco Never Used    Goals Met:  Independence with exercise equipment Exercise tolerated well No report of cardiac concerns or symptoms  Goals Unmet:  Not Applicable  Comments: Pt able to follow exercise prescription today without complaint.  Will continue to monitor for progression.    Dr. Emily Filbert is Medical Director for [place] and [facility].

## 2020-02-07 ENCOUNTER — Other Ambulatory Visit: Payer: Self-pay

## 2020-02-07 ENCOUNTER — Encounter: Payer: No Typology Code available for payment source | Admitting: *Deleted

## 2020-02-07 DIAGNOSIS — Z951 Presence of aortocoronary bypass graft: Secondary | ICD-10-CM | POA: Diagnosis not present

## 2020-02-07 NOTE — Progress Notes (Signed)
Daily Session Note  Patient Details  Name: Jeffery West MRN: [id number] Date of Birth: Feb 08, 1956 Referring Provider:     Cardiac Rehab from 01/03/2020 in [facility] Cardiac and Pulmonary Rehab  Referring Provider [name], MD      Encounter Date: 02/07/2020  Check In:  Session Check In - 02/07/20 0849      Check-In   Supervising physician immediately available to respond to emergencies See telemetry face sheet for immediately available ER MD    Location [facility]-Cardiac & Pulmonary Rehab    Staff Present Heath Lark, RN, BSN, CCRP;Melissa Lone Jack RDN, Rowe Pavy, BA, ACSM CEP, Exercise Physiologist    Virtual Visit No    Medication changes reported     No    Fall or balance concerns reported    No    Warm-up and Cool-down Performed on first and last piece of equipment    Resistance Training Performed Yes    VAD Patient? No    PAD/SET Patient? No      Pain Assessment   Currently in Pain? No/denies              Social History   Tobacco Use  Smoking Status Never Smoker  Smokeless Tobacco Never Used    Goals Met:  Independence with exercise equipment Exercise tolerated well No report of cardiac concerns or symptoms  Goals Unmet:  Not Applicable  Comments: Pt able to follow exercise prescription today without complaint.  Will continue to monitor for progression.    Dr. Emily Filbert is Medical Director for [place] and [facility].

## 2020-02-11 NOTE — Progress Notes (Signed)
Office Visit    Patient Name: Jeffery West Date of Encounter: 02/13/2020  Primary Care Provider:  Jerrol Banana., MD Primary Cardiologist:  [name], MD Electrophysiologist:  None   Chief Complaint    Jeffery West is a 64 y.o. male with a hx of obesity, HTN, HLD, DM2, severe aortic stenosis s/p AVR 11/26/19 and CAD s/p CABG 11/26/19 presents today for follow-up of CAD.  Past Medical History    Past Medical History:  Diagnosis Date  . Coronary artery disease   . Diabetes mellitus without complication ([place])   . Erectile dysfunction   . Gout   . Heart murmur   . Hyperlipidemia   . Hypertension   . Obesity   . Osteoarthritis   . Vitamin D deficiency    Past Surgical History:  Procedure Laterality Date  . AORTIC VALVE REPLACEMENT N/A 11/26/2019   Procedure: AORTIC VALVE REPLACEMENT (AVR) USING INSPIRIS AORTIC VALVE SIZE 23MM;  Surgeon: Gaye Pollack, MD;  Location: [place];  Service: Open Heart Surgery;  Laterality: N/A;  . CARDIAC CATHETERIZATION    . COLONOSCOPY WITH PROPOFOL N/A 08/24/2019   Procedure: COLONOSCOPY WITH PROPOFOL;  Surgeon: Lucilla Lame, MD;  Location: [facility];  Service: Endoscopy;  Laterality: N/A;  . CORONARY ARTERY BYPASS GRAFT N/A 11/26/2019   Procedure: CORONARY ARTERY BYPASS GRAFTING (CABG) X Two , using left internal mammary artery and right leg greater saphenous vein harvested endoscopically.;  Surgeon: Gaye Pollack, MD;  Location: [place] OR;  Service: Open Heart Surgery;  Laterality: N/A;  . LEFT HEART CATH AND CORONARY ANGIOGRAPHY Left 10/19/2019   Procedure: LEFT HEART CATH AND CORONARY ANGIOGRAPHY;  Surgeon: Minna Merritts, MD;  Location: [place] CV LAB;  Service: Cardiovascular;  Laterality: Left;  . NO PAST SURGERIES    . RIGHT HEART CATH N/A 10/19/2019   Procedure: RIGHT HEART CATH;  Surgeon: Minna Merritts, MD;  Location: [place] CV LAB;  Service: Cardiovascular;  Laterality: N/A;  . SHOULDER ARTHROSCOPY WITH  OPEN ROTATOR CUFF REPAIR Right 07/26/2017   Procedure: SHOULDER ARTHROSCOPY WITH OPEN ROTATOR CUFF REPAIR,SUBACROMINAL DECOMPRESSION,DISTAL CLAVICLE EXCISION;  Surgeon: Thornton Park, MD;  Location: [facility] OR[facility];  Service: Orthopedics;  Laterality: Right;  . TEE WITHOUT CARDIOVERSION N/A 11/26/2019   Procedure: TRANSESOPHAGEAL ECHOCARDIOGRAM (TEE);  Surgeon: Gaye Pollack, MD;  Location: [place];  Service: Open Heart Surgery;  Laterality: N/A;    Allergies  Allergies  Allergen Reactions  . Penicillins Hives, Swelling and Other (See Comments)    Has patient had a PCN reaction causing immediate rash, facial/tongue/throat swelling, SOB or lightheadedness with hypotension: Unknown Has patient had a PCN reaction causing severe rash involving mucus membranes or skin necrosis: No Has patient had a PCN reaction that required hospitalization: no Has patient had a PCN reaction occurring within the last 10 years: No If all of the above answers are "NO", then may proceed with Cephalosporin use.     History of Present Illness    Jeffery West is a 64 y.o. male with a hx of obesity, HTN, HLD, DM2, severe aortic stenosis s/p AVR 11/26/19 and CAD s/p CABGx2 11/26/19 last seen 12/14/19.  Echocardiogram at [facility] June 2017 showed mean aortic valve gradient 25.5 millimercury.  Echo 09/28/2019 showed increase in aortic valve mean gradient of 53.5 mmHg, LVEF 60 to 97%, grade 1 diastolic dysfunction.  Cardiac catheterization 10/19/2019 showed 80% stenosis to mid LAD, 70% first diagonal stenosis.  He was recommended  for coronary bypass graft surgery and bioprosthetic aortic valve replacement due to his young age and poorly controlled diabetes.  Underwent AVR (59mm Edwards Inspiris Resilia pericardial valve) and CABGx2 (LIMA-LAD, SVG-first diagonal) 11/26/2019. He was discharged on a 5 day course of Lasix and Potassium.   When last seen 12/14/19 he was doing well after surgery.  He had had all of his sutures removed and had seen  his Psychologist, sport and exercise.  He was excited to participate in cardiac rehab.  He was seen by Dr. Cyndia Bent 12/26/2019 and doing well.  He was allowed to return to driving but told to refrain from lifting anything heavier than 10 pounds for 3 months postoperatively.  Reports feeling well.  He is enjoying participating in cardiac rehab.  Reports no chest pain, pressure, tightness.  Reports no shortness of breath nor dyspnea on exertion.  He is planning to return to work September 8 for [employer] in [employer].  He is planning to continue cardiac rehab at a 4 PM appointment on Mondays and Thursdays. He inquires about medication for ED - informed him that as he is on 3 months from surgery and not on nitrate he can discuss with primary care.   EKGs/Labs/Other Studies Reviewed:   The following studies were reviewed today:  Echo 09/2019 1. Left ventricular ejection fraction, by estimation, is 60 to 65%. The  left ventricle has normal function. The left ventricle has no regional  wall motion abnormalities. Left ventricular diastolic parameters are  consistent with Grade I diastolic  dysfunction (impaired relaxation).  2. Right ventricular systolic function is normal. The right ventricular  size is normal.  3. Left atrial size was mildly dilated.  4. The aortic valve is severely calcified. Severe aortic valve stenosis.  Aortic valve area, by VTI measures 1.10 cm. Aortic valve mean gradient  measures 53.5 mmHg. Aortic valve Vmax measures 4.93 m/s.   Cardiac cath 10/2019 Conclusion   Mid LAD-1 lesion is 40% stenosed.   Mid LAD-2 lesion is 80% stenosed.   1st Diag lesion is 70% stenosed.   The left ventricular systolic function is normal.   LV end diastolic pressure is normal.   The left ventricular ejection fraction is 55-65% by visual estimate.   There is no mitral valve regurgitation.   There is severe aortic valve stenosis.    EKG:  EKG is  ordered today.  The ekg  ordered today demonstrates NSR 75 bpm with incomplete RBBB.  Recent Labs: 07/20/2019: TSH 1.970 11/27/2019: Magnesium 2.2 11/28/2019: Hemoglobin 12.2; Platelets 96 12/18/2019: ALT 34; BUN 13; Creatinine, Ser 0.97; Potassium 4.6; Sodium 140  Recent Lipid Panel    Component Value Date/Time   CHOL 108 12/18/2019 0830   TRIG 110 12/18/2019 0830   HDL 35 (L) 12/18/2019 0830   CHOLHDL 3.1 12/18/2019 0830   CHOLHDL 7.4 (H) 05/23/2017 1222   LDLCALC 53 12/18/2019 0830   [place]  05/23/2017 1222     Comment:     . LDL cholesterol not calculated. Triglyceride levels greater than 400 mg/dL invalidate calculated LDL results. . Reference range: <100 . Desirable range <100 mg/dL for primary prevention;   <70 mg/dL for patients with CHD or diabetic patients  with > or = 2 CHD risk factors. Marland Kitchen LDL-C is now calculated using the Martin-Hopkins  calculation, which is a validated novel method providing  better accuracy than the Friedewald equation in the  estimation of LDL-C.  Cresenciano Genre et al. Annamaria Helling. 2774;128(78): 2061-2068  (http://education.[employer].com/faq/FAQ164)  Home Medications   Current Meds  Medication Sig  . acetaminophen (TYLENOL) 500 MG tablet Take 1,000 mg by mouth every 6 (six) hours as needed for moderate pain or headache.  . albuterol (PROAIR HFA) 108 (90 Base) MCG/ACT inhaler Inhale 1 puff into the lungs every 6 (six) hours as needed for wheezing or shortness of breath.  . allopurinol (ZYLOPRIM) 300 MG tablet Take 1 tablet (300 mg total) by mouth daily.  Marland Kitchen aspirin EC 325 MG EC tablet Take 1 tablet (325 mg total) by mouth daily.  . BD PEN NEEDLE NANO 2ND GEN 32G X 4 MM MISC USE AS DIRECTED  . Cholecalciferol (VITAMIN D) 2000 units CAPS Take 2,000 Units by mouth every evening.   . ezetimibe (ZETIA) 10 MG tablet TAKE 1 TABLET BY MOUTH EVERY DAY  . glucose blood (ONETOUCH VERIO) test strip Use as instructed; BID  . insulin aspart (NOVOLOG) 100 UNIT/ML injection Inject 6  Units into the skin every evening.  . insulin detemir (LEVEMIR) 100 UNIT/ML injection Inject 0.5 mLs (50 Units total) into the skin daily.  . Insulin Syringe-Needle U-100 30G X 1/2" 1 ML MISC Use with insulin 3 times daily  . losartan (COZAAR) 50 MG tablet Take 50 mg by mouth daily.  . magnesium oxide (MAG-OX) 400 MG tablet Take 400 mg by mouth 2 (two) times daily.   . metFORMIN (GLUCOPHAGE) 1000 MG tablet Take 1 tablet (1,000 mg total) by mouth 2 (two) times daily with a meal.  . metoprolol tartrate (LOPRESSOR) 25 MG tablet Take 1 tablet (25 mg total) by mouth 2 (two) times daily.  Marland Kitchen nystatin cream (MYCOSTATIN) Apply 1 application topically 2 (two) times daily.  [name] Delica Lancets 52W MISC Use 3 times a day  . rosuvastatin (CRESTOR) 5 MG tablet Take 1 tablet (5 mg total) by mouth daily.  . tamsulosin (FLOMAX) 0.4 MG CAPS capsule TAKE 1 CAPSULE BY MOUTH EVERY DAY    Review of Systems   Review of Systems  Constitutional: Negative for chills, fever and malaise/fatigue.  Cardiovascular: Negative for chest pain, dyspnea on exertion, leg swelling, near-syncope, orthopnea, palpitations and syncope.  Respiratory: Negative for cough, shortness of breath and wheezing.   Gastrointestinal: Negative for nausea and vomiting.  Neurological: Negative for dizziness, light-headedness and weakness.   All other systems reviewed and are otherwise negative except as noted above.  Physical Exam   VS:  BP 120/60 (BP Location: Left Arm, Patient Position: Sitting, Cuff Size: Normal)   Pulse 75   Ht 5\' 11"  (1.803 m)   Wt 253 lb 6 oz (114.9 kg)   SpO2 98%   BMI 35.34 kg/m  , BMI Body mass index is 35.34 kg/m. GEN: Well nourished, well developed, in no acute distress. HEENT: normal. Neck: Supple, no JVD, carotid bruits, or masses. Cardiac: RRR, no murmurs, rubs, or gallops. No clubbing, cyanosis, edema.  Radials/DP/PT 2+ and equal bilaterally.  Respiratory:  Respirations regular and unlabored, clear  to auscultation bilaterally. GI: Soft, nontender, nondistended, BS + x 4. MS: No deformity or atrophy. Skin: Warm and dry, no rash.   Neuro:  Strength and sensation are intact. Psych: Normal affect.   Assessment & Plan    1. CAD s/p CABG X2 - Surgical site continues to heal appropriately.  Reports no anginal symptoms.  Participating cardiac rehab.  He endorses eating a low-sodium, heart healthy diet.    GDMT includes aspirin, beta-blocker, statin.  Refill provided.  3 months from surgery he will transition  from Aspirin 325mg  to 81mg  on 02/26/20.   Provided note to return to work 02/27/20.  2. Severe aortic stenosis s/p bioprosthetic aortic valve - Surgical site healing appropriately.  No chest pain, pressure, tightness.  No lightheadedness, dizziness, shortness of breath.  Continue optimal BP and heart rate control.   3. DM2 - 11/22/19 A1c 11.4. Continue to follow with PCP. Reports blood sugars have been overall improving.  4. HTN - BP well controlled.  Continue with antihypertensive regimen.  BP goal less than 130/80.  5. HLD, LDL goal less than 70- 07/20/19 total cholesterol 227, HDL 34, triglycerides 414, LDL 120.  12/18/19 LDL 53. LDL at goal - continue present regimen of Zetia 10 mg a day and Crestor 5 mg daily.    Disposition:   Follow up in 3 month(s) with Dr. Rockey Situ or APP    Loel Dubonnet, NP 02/13/2020, 9:48 AM

## 2020-02-12 ENCOUNTER — Other Ambulatory Visit: Payer: Self-pay

## 2020-02-12 ENCOUNTER — Encounter: Payer: No Typology Code available for payment source | Admitting: *Deleted

## 2020-02-12 DIAGNOSIS — Z951 Presence of aortocoronary bypass graft: Secondary | ICD-10-CM | POA: Diagnosis not present

## 2020-02-12 NOTE — Progress Notes (Signed)
Daily Session Note  Patient Details  Name: RAJOHN HENERY MRN: [id number] Date of Birth: [birth year] Referring Provider:     Cardiac Rehab from 01/03/2020 in [facility] Cardiac and Pulmonary Rehab  Referring Provider [name], MD      Encounter Date: 02/12/2020  Check In:  Session Check In - 02/12/20 0929      Check-In   Supervising physician immediately available to respond to emergencies See telemetry face sheet for immediately available ER MD    Location [facility]-Cardiac & Pulmonary Rehab    Staff Present Heath Lark, RN, BSN, Jacklynn Bue, MS Exercise Physiologist;Amanda Oletta Darter, [place], ACSM CEP, Exercise Physiologist    Virtual Visit No    Medication changes reported     No    Fall or balance concerns reported    No    Warm-up and Cool-down Performed on first and last piece of equipment    Resistance Training Performed Yes    VAD Patient? No    PAD/SET Patient? No      Pain Assessment   Currently in Pain? No/denies              Social History   Tobacco Use  Smoking Status Never Smoker  Smokeless Tobacco Never Used    Goals Met:  Independence with exercise equipment Exercise tolerated well No report of cardiac concerns or symptoms  Goals Unmet:  Not Applicable  Comments: Pt able to follow exercise prescription today without complaint.  Will continue to monitor for progression.    Dr. Emily Filbert is Medical Director for [place] and [facility].

## 2020-02-13 ENCOUNTER — Other Ambulatory Visit: Payer: Self-pay

## 2020-02-13 ENCOUNTER — Ambulatory Visit (INDEPENDENT_AMBULATORY_CARE_PROVIDER_SITE_OTHER): Payer: No Typology Code available for payment source | Admitting: Family

## 2020-02-13 ENCOUNTER — Encounter: Payer: Self-pay | Admitting: Family

## 2020-02-13 VITALS — BP 120/60 | HR 75 | Ht 71.0 in | Wt 253.4 lb

## 2020-02-13 DIAGNOSIS — I350 Nonrheumatic aortic (valve) stenosis: Secondary | ICD-10-CM

## 2020-02-13 DIAGNOSIS — I25118 Atherosclerotic heart disease of native coronary artery with other forms of angina pectoris: Secondary | ICD-10-CM

## 2020-02-13 DIAGNOSIS — E785 Hyperlipidemia, unspecified: Secondary | ICD-10-CM | POA: Diagnosis not present

## 2020-02-13 DIAGNOSIS — Z952 Presence of prosthetic heart valve: Secondary | ICD-10-CM

## 2020-02-13 MED ORDER — METOPROLOL TARTRATE 25 MG PO TABS
25.0000 mg | ORAL_TABLET | Freq: Two times a day (BID) | ORAL | 3 refills | Status: DC
Start: 2020-02-13 — End: 2021-03-27

## 2020-02-13 NOTE — Patient Instructions (Addendum)
Medication Instructions:  Your physician has recommended you make the following change in your medication:   On September 7th, you may change your Aspirin to 81mg   *If you need a refill on your cardiac medications before your next appointment, please call your pharmacy*  Lab Work: No lab work today.  You lab work at the end of June showed your LDL or bad cholesterol was 53 - this is a fantastic result!! - we can it to be less than 70.   Testing/Procedures: Your EKG today was stable compared to previous and showed normal sinus rhythm.   Follow-Up: At [facility], you and your health needs are our priority.  As part of our continuing mission to provide you with exceptional heart care, we have created designated Provider Care Teams.  These Care Teams include your primary Cardiologist (physician) and Advanced Practice Providers (APPs -  Physician Assistants and Nurse Practitioners) who all work together to provide you with the care you need, when you need it.  We recommend signing up for the patient portal called "MyChart".  Sign up information is provided on this After Visit Summary.  MyChart is used to connect with patients for Virtual Visits (Telemedicine).  Patients are able to view lab/test results, encounter notes, upcoming appointments, etc.  Non-urgent messages can be sent to your provider as well.   To learn more about what you can do with MyChart, go to [email or web address].    Your next appointment:   3 month(s)  The format for your next appointment:   In Person  Provider:   You may see [name], MD or one of the following Advanced Practice Providers on your designated Care Team:    Murray Hodgkins, NP  Christell Faith, PA-C  Marrianne Mood, PA-C  Laurann Montana, NP  Other Instructions Keep up the good work with cardiac rehab!

## 2020-02-14 ENCOUNTER — Encounter: Payer: No Typology Code available for payment source | Admitting: *Deleted

## 2020-02-14 DIAGNOSIS — Z951 Presence of aortocoronary bypass graft: Secondary | ICD-10-CM | POA: Diagnosis not present

## 2020-02-14 NOTE — Progress Notes (Signed)
Daily Session Note  Patient Details  Name: Jeffery West MRN: [id number] Date of Birth: 08/21/1955 Referring Provider:     Cardiac Rehab from 01/03/2020 in [facility]  Referring Provider Timothy Gollan, MD      Encounter Date: 02/14/2020  Check In:  Session Check In - 02/14/20 0827      Check-In   Supervising physician immediately available to respond to emergencies See telemetry face sheet for immediately available ER MD    Location [facility]    Staff Present Susanne Bice, RN, BSN, CCRP;Melissa Caiola RDN, LDN;Amanda Sommer, BA, ACSM CEP, Exercise Physiologist    Virtual Visit No    Medication changes reported     No    Fall or balance concerns reported    No    Warm-up and Cool-down Performed on first and last piece of equipment    Resistance Training Performed Yes    VAD Patient? No    PAD/SET Patient? No      Pain Assessment   Currently in Pain? No/denies              Social History   Tobacco Use  Smoking Status Never Smoker  Smokeless Tobacco Never Used    Goals Met:  Independence with exercise equipment Exercise tolerated well No report of cardiac concerns or symptoms  Goals Unmet:  Not Applicable  Comments: Pt able to follow exercise prescription today without complaint.  Will continue to monitor for progression.    Dr. Mark Miller is Medical Director for [facility] and [facility]. 

## 2020-02-15 ENCOUNTER — Telehealth: Payer: Self-pay | Admitting: *Deleted

## 2020-02-15 NOTE — Telephone Encounter (Signed)
Patient was advised.  

## 2020-02-15 NOTE — Telephone Encounter (Signed)
yes

## 2020-02-15 NOTE — Telephone Encounter (Signed)
Copied from [place] [phone]. Topic: General - Other >> Feb 15, 2020 11:43 AM Rainey Pines A wrote: Patient would like a callback from Dr. Marlan Palau nurse in regards to if it is okay for him to return to using his [place] pen while going out of town this weekend and then once patient comes back into town using his regular insulin and needles. Patient would like a callback today. Please advise

## 2020-02-21 ENCOUNTER — Other Ambulatory Visit: Payer: Self-pay

## 2020-02-21 ENCOUNTER — Encounter: Payer: No Typology Code available for payment source | Attending: Cardiovascular Disease | Admitting: *Deleted

## 2020-02-21 DIAGNOSIS — E669 Obesity, unspecified: Secondary | ICD-10-CM | POA: Insufficient documentation

## 2020-02-21 DIAGNOSIS — M109 Gout, unspecified: Secondary | ICD-10-CM | POA: Diagnosis not present

## 2020-02-21 DIAGNOSIS — Z794 Long term (current) use of insulin: Secondary | ICD-10-CM | POA: Insufficient documentation

## 2020-02-21 DIAGNOSIS — M1990 Unspecified osteoarthritis, unspecified site: Secondary | ICD-10-CM | POA: Insufficient documentation

## 2020-02-21 DIAGNOSIS — R011 Cardiac murmur, unspecified: Secondary | ICD-10-CM | POA: Insufficient documentation

## 2020-02-21 DIAGNOSIS — Z951 Presence of aortocoronary bypass graft: Secondary | ICD-10-CM | POA: Diagnosis not present

## 2020-02-21 DIAGNOSIS — Z6835 Body mass index (BMI) 35.0-35.9, adult: Secondary | ICD-10-CM | POA: Diagnosis not present

## 2020-02-21 DIAGNOSIS — I2510 Atherosclerotic heart disease of native coronary artery without angina pectoris: Secondary | ICD-10-CM | POA: Diagnosis not present

## 2020-02-21 DIAGNOSIS — I10 Essential (primary) hypertension: Secondary | ICD-10-CM | POA: Diagnosis not present

## 2020-02-21 DIAGNOSIS — E559 Vitamin D deficiency, unspecified: Secondary | ICD-10-CM | POA: Diagnosis not present

## 2020-02-21 DIAGNOSIS — E785 Hyperlipidemia, unspecified: Secondary | ICD-10-CM | POA: Diagnosis not present

## 2020-02-21 DIAGNOSIS — Z79899 Other long term (current) drug therapy: Secondary | ICD-10-CM | POA: Diagnosis not present

## 2020-02-21 NOTE — Progress Notes (Signed)
Daily Session Note  Patient Details  Name: Jeffery West MRN: [id number] Date of Birth: 04/28/1956 Referring Provider:     Cardiac Rehab from 01/03/2020 in [facility]  Referring Provider [name], MD      Encounter Date: 02/21/2020  Check In:  Session Check In - 02/21/20 0807      Check-In   Supervising physician immediately available to respond to emergencies See telemetry face sheet for immediately available ER MD    Location [facility]    Staff Present Renita Papa, RN Margurite Auerbach, MS Exercise Physiologist;Melissa Caiola RDN, Rowe Pavy, BA, ACSM CEP, Exercise Physiologist    Virtual Visit No    Medication changes reported     No    Fall or balance concerns reported    No    Warm-up and Cool-down Performed on first and last piece of equipment    Resistance Training Performed Yes    VAD Patient? No    PAD/SET Patient? No      Pain Assessment   Currently in Pain? No/denies              Social History   Tobacco Use  Smoking Status Never Smoker  Smokeless Tobacco Never Used    Goals Met:  Independence with exercise equipment Exercise tolerated well No report of cardiac concerns or symptoms Strength training completed today  Goals Unmet:  Not Applicable  Comments: Pt able to follow exercise prescription today without complaint.  Will continue to monitor for progression.    Dr. Emily Filbert is Medical Director for [place] and [facility].

## 2020-02-21 NOTE — Progress Notes (Signed)
Trena Platt Cummings,acting as a scribe for [name], MD.,have documented all relevant documentation on the behalf of [name], MD,as directed by  [name], MD while in the presence of [name], MD.   Established patient visit   Patient: Jeffery West   DOB: July 23, 1955   64 y.o. Male  MRN: [id number] Visit Date: 02/26/2020  Today's healthcare provider: [name], MD   Chief Complaint  Patient presents with  . Diabetes   Subjective    HPI  Patient has no specific complaints.  He is still not exercising regularly.  He says he just does some yard work. In reviewing his diabetes management he had yeast infections with Invokana.  He says he is taking his medications as prescribed. Diabetes Mellitus Type II, follow-up  Lab Results  Component Value Date   HGBA1C 8.2 (A) 02/26/2020   HGBA1C 11.4 (H) 11/22/2019   HGBA1C 11.4 (H) 07/20/2019   Last seen for diabetes 6 weeks ago.  Management since then includes; Stopped Novolog Breakfast and Lunch Doses. Decreased Levemir dose from 55 units t 50 units daily. Consider weaning insulin and starting Ozempic or previous medication on next visit.  Also consider Jardiance. He reports good compliance with treatment. He is not having side effects.   Home blood sugar records: fasting range: 170s  Episodes of hypoglycemia? No    Current insulin regiment: 50 units of Levimir Most Recent Eye Exam: 3 weeks ago. No retinopathy.   Past Surgical History:  Procedure Laterality Date  . AORTIC VALVE REPLACEMENT N/A 11/26/2019   Procedure: AORTIC VALVE REPLACEMENT (AVR) USING INSPIRIS AORTIC VALVE SIZE 23MM;  Surgeon: Gaye Pollack, MD;  Location: [place];  Service: Open Heart Surgery;  Laterality: N/A;  . CARDIAC CATHETERIZATION    . COLONOSCOPY WITH PROPOFOL N/A 08/24/2019   Procedure: COLONOSCOPY WITH PROPOFOL;  Surgeon: Lucilla Lame, MD;  Location[facility];  Service: Endoscopy;  Laterality: N/A;    . CORONARY ARTERY BYPASS GRAFT N/A 11/26/2019   Procedure: CORONARY ARTERY BYPASS GRAFTING (CABG) X Two , using left internal mammary artery and right leg greater saphenous vein harvested endoscopically.;  Surgeon: Gaye Pollack, MD;  Location: [place] OR;  Service: Open Heart Surgery;  Laterality: N/A;  . LEFT HEART CATH AND CORONARY ANGIOGRAPHY Left 10/19/2019   Procedure: LEFT HEART CATH AND CORONARY ANGIOGRAPHY;  Surgeon: Minna Merritts, MD;  Location: [place] CV LAB;  Service: Cardiovascular;  Laterality: Left;  . NO PAST SURGERIES    . RIGHT HEART CATH N/A 10/19/2019   Procedure: RIGHT HEART CATH;  Surgeon: Minna Merritts, MD;  Location: [place] CV LAB;  Service: Cardiovascular;  Laterality: N/A;  . SHOULDER ARTHROSCOPY WITH OPEN ROTATOR CUFF REPAIR Right 07/26/2017   Procedure: SHOULDER ARTHROSCOPY WITH OPEN ROTATOR CUFF REPAIR,SUBACROMINAL DECOMPRESSION,DISTAL CLAVICLE EXCISION;  Surgeon: Thornton Park, MD;  Location: [facility];  Service: Orthopedics;  Laterality: Right;  . TEE WITHOUT CARDIOVERSION N/A 11/26/2019   Procedure: TRANSESOPHAGEAL ECHOCARDIOGRAM (TEE);  Surgeon: Gaye Pollack, MD;  Location: [place];  Service: Open Heart Surgery;  Laterality: N/A;       Medications: Outpatient Medications Prior to Visit  Medication Sig  . acetaminophen (TYLENOL) 500 MG tablet Take 1,000 mg by mouth every 6 (six) hours as needed for moderate pain or headache.  . albuterol (PROAIR HFA) 108 (90 Base) MCG/ACT inhaler Inhale 1 puff into the lungs every 6 (six) hours as needed for wheezing or shortness of  breath.  . allopurinol (ZYLOPRIM) 300 MG tablet Take 1 tablet (300 mg total) by mouth daily.  Marland Kitchen aspirin EC 325 MG EC tablet Take 1 tablet (325 mg total) by mouth daily.  . BD PEN NEEDLE NANO 2ND GEN 32G X 4 MM MISC USE AS DIRECTED  . Cholecalciferol (VITAMIN D) 2000 units CAPS Take 2,000 Units by mouth every evening.   . ezetimibe (ZETIA) 10 MG tablet TAKE 1 TABLET BY MOUTH EVERY DAY   . glucose blood (ONETOUCH VERIO) test strip Use as instructed; BID  . insulin aspart (NOVOLOG) 100 UNIT/ML injection Inject 6 Units into the skin every evening.  . insulin detemir (LEVEMIR) 100 UNIT/ML injection Inject 0.5 mLs (50 Units total) into the skin daily.  . Insulin Syringe-Needle U-100 30G X 1/2" 1 ML MISC Use with insulin 3 times daily  . losartan (COZAAR) 50 MG tablet Take 50 mg by mouth daily.  . magnesium oxide (MAG-OX) 400 MG tablet Take 400 mg by mouth 2 (two) times daily.   . metFORMIN (GLUCOPHAGE) 1000 MG tablet Take 1 tablet (1,000 mg total) by mouth 2 (two) times daily with a meal.  . metoprolol tartrate (LOPRESSOR) 25 MG tablet Take 1 tablet (25 mg total) by mouth 2 (two) times daily.  Marland Kitchen nystatin cream (MYCOSTATIN) Apply 1 application topically 2 (two) times daily.  [name] Delica Lancets 30Q MISC Use 3 times a day  . tamsulosin (FLOMAX) 0.4 MG CAPS capsule TAKE 1 CAPSULE BY MOUTH EVERY DAY  . rosuvastatin (CRESTOR) 5 MG tablet Take 1 tablet (5 mg total) by mouth daily.   No facility-administered medications prior to visit.    Review of Systems  Constitutional: Negative for appetite change, chills and fever.  Respiratory: Negative for chest tightness, shortness of breath and wheezing.   Cardiovascular: Negative for chest pain and palpitations.  Gastrointestinal: Negative for abdominal pain, nausea and vomiting.    Last hemoglobin A1c Lab Results  Component Value Date   HGBA1C 8.2 (A) 02/26/2020      Objective    BP 110/67   Pulse 81   Temp 99.1 F (37.3 C)   Ht 5\' 10"  (1.778 m)   Wt 256 lb (116.1 kg)   BMI 36.73 kg/m  BP Readings from Last 3 Encounters:  02/26/20 110/67  02/13/20 120/60  01/17/20 118/67   Wt Readings from Last 3 Encounters:  02/26/20 256 lb (116.1 kg)  02/13/20 253 lb 6 oz (114.9 kg)  01/17/20 (!) 258 lb (117 kg)      Physical Exam Constitutional:      Appearance: He is obese.  HENT:     Head: Normocephalic and  atraumatic.  Eyes:     General: No scleral icterus.    Conjunctiva/sclera: Conjunctivae normal.  Cardiovascular:     Rate and Rhythm: Normal rate and regular rhythm.     Pulses: Normal pulses.     Heart sounds: Normal heart sounds.  Pulmonary:     Effort: Pulmonary effort is normal.     Breath sounds: Normal breath sounds.  Abdominal:     Palpations: Abdomen is soft.  Musculoskeletal:     Right lower leg: No edema.     Left lower leg: No edema.  Lymphadenopathy:     Cervical: No cervical adenopathy.  Skin:    General: Skin is warm and dry.  Neurological:     General: No focal deficit present.     Mental Status: He is alert and oriented to person, place, and  time.  Psychiatric:        Mood and Affect: Mood normal.        Behavior: Behavior normal.        Thought Content: Thought content normal.        Results for orders placed or performed in visit on 02/26/20  POCT HgB A1C  Result Value Ref Range   Hemoglobin A1C 8.2 (A) 4.0 - 5.6 %   Estimated Average Glucose 189     Assessment & Plan      1. Type 2 diabetes mellitus with other specified complication, without long-term current use of insulin (HCC) Slightly elevated 8.2 but improved from the last A1c of 11.4.  Marland Kitchencontinue present regimen but patient really needs to work on exercise and dietary changes.  To try to simplify things at this time will restart Trulicity and decrease Tresiba to 35 units daily.  We will go ahead and stop Levemir completely as he was alternating between Levemir and [place].  Finish the NovoLog and then stop as he is down to 1 meal per day.  Follow-up in 1 month with his blood sugar readings  - POCT HgB A1C - insulin degludec (TRESIBA FLEXTOUCH) 100 UNIT/ML FlexTouch Pen; Take 0.75mg / 0.5 ML Subcutaneously every Tuesday.  Dispense: 15 mL; Refill: 5 - POCT UA - Microalbumin  2. S/P CABG (coronary artery bypass graft)  3. S/P aortic valve replacement   4. Essential hypertension Controlled  5.  Mild intermittent asthma without complication   6. Morbid obesity ([place]) Weight is not really changed since the patient has gotten home from bypass.  He really needs to work on diet and exercise.  7. Mixed hyperlipidemia On rosuvastatin 5 mg daily   Return in about 1 month (around 03/27/2020).         Kalin Amrhein [place] Mon, MD  [facility] [phone] (phone) [phone] (fax)  [place]

## 2020-02-24 ENCOUNTER — Other Ambulatory Visit: Payer: Self-pay | Admitting: Family Medicine

## 2020-02-24 DIAGNOSIS — E119 Type 2 diabetes mellitus without complications: Secondary | ICD-10-CM

## 2020-02-26 ENCOUNTER — Encounter: Payer: Self-pay | Admitting: Family Medicine

## 2020-02-26 ENCOUNTER — Encounter: Payer: No Typology Code available for payment source | Admitting: *Deleted

## 2020-02-26 ENCOUNTER — Ambulatory Visit (INDEPENDENT_AMBULATORY_CARE_PROVIDER_SITE_OTHER): Payer: No Typology Code available for payment source | Admitting: Family Medicine

## 2020-02-26 ENCOUNTER — Other Ambulatory Visit: Payer: Self-pay

## 2020-02-26 VITALS — BP 110/67 | HR 81 | Temp 99.1°F | Ht 70.0 in | Wt 256.0 lb

## 2020-02-26 DIAGNOSIS — E1169 Type 2 diabetes mellitus with other specified complication: Secondary | ICD-10-CM | POA: Diagnosis not present

## 2020-02-26 DIAGNOSIS — Z951 Presence of aortocoronary bypass graft: Secondary | ICD-10-CM

## 2020-02-26 DIAGNOSIS — Z952 Presence of prosthetic heart valve: Secondary | ICD-10-CM | POA: Diagnosis not present

## 2020-02-26 DIAGNOSIS — I10 Essential (primary) hypertension: Secondary | ICD-10-CM

## 2020-02-26 DIAGNOSIS — E782 Mixed hyperlipidemia: Secondary | ICD-10-CM

## 2020-02-26 DIAGNOSIS — J4520 Mild intermittent asthma, uncomplicated: Secondary | ICD-10-CM

## 2020-02-26 LAB — POCT UA - MICROALBUMIN: Microalbumin Ur, POC: 50 mg/L

## 2020-02-26 LAB — POCT GLYCOSYLATED HEMOGLOBIN (HGB A1C)
Estimated Average Glucose: 189
Hemoglobin A1C: 8.2 % — AB (ref 4.0–5.6)

## 2020-02-26 MED ORDER — TRESIBA FLEXTOUCH 100 UNIT/ML ~~LOC~~ SOPN: PEN_INJECTOR | SUBCUTANEOUS | 5 refills | Status: DC

## 2020-02-26 NOTE — Patient Instructions (Addendum)
DECREASE INSULIN - TRESIBA TO 35 UNITS DAILY !!!   STOP LEVEMIR!!   FINISH NOVOLOG AND THEN STOP!!!   F/U IN 1 MONTH WITH BLOOD SUGAR READINGS!!!

## 2020-02-26 NOTE — Progress Notes (Signed)
Daily Session Note  Patient Details  Name: Jeffery West MRN: [id number] Date of Birth: 1955/10/10 Referring Provider:     Cardiac Rehab from 01/03/2020 in [facility]  Referring Provider [name], MD      Encounter Date: 02/26/2020  Check In:  Session Check In - 02/26/20 0742      Check-In   Supervising physician immediately available to respond to emergencies See telemetry face sheet for immediately available ER MD    Location [facility]    Staff Present Renita Papa, RN Margurite Auerbach, MS Exercise Physiologist;Jessica Luan Pulling, MA, RCEP, CCRP, Kathyrn Drown, RN BSN    Virtual Visit No    Medication changes reported     No    Fall or balance concerns reported    No    Warm-up and Cool-down Performed on first and last piece of equipment    Resistance Training Performed Yes    VAD Patient? No    PAD/SET Patient? No      Pain Assessment   Currently in Pain? No/denies              Social History   Tobacco Use  Smoking Status Never Smoker  Smokeless Tobacco Never Used    Goals Met:  Independence with exercise equipment Exercise tolerated well No report of cardiac concerns or symptoms Strength training completed today  Goals Unmet:  Not Applicable  Comments: Pt able to follow exercise prescription today without complaint.  Will continue to monitor for progression.    Dr. Emily Filbert is Medical Director for [place] and [facility].

## 2020-02-27 ENCOUNTER — Encounter: Payer: Self-pay | Admitting: *Deleted

## 2020-02-27 DIAGNOSIS — Z951 Presence of aortocoronary bypass graft: Secondary | ICD-10-CM

## 2020-02-27 NOTE — Progress Notes (Signed)
Cardiac Individual Treatment Plan  Patient Details  Name: Jeffery West MRN: [id number] Date of Birth: [birth year] Referring Provider:     Cardiac Rehab from 01/03/2020 in [facility] Cardiac and Pulmonary Rehab  Referring Provider [name], MD      Initial Encounter Date:    Cardiac Rehab from 01/03/2020 in [facility] Cardiac and Pulmonary Rehab  Date 01/03/20      Visit Diagnosis: S/P CABG x 2  Patient's Home Medications on Admission:  Current Outpatient Medications:  .  acetaminophen (TYLENOL) 500 MG tablet, Take 1,000 mg by mouth every 6 (six) hours as needed for moderate pain or headache., Disp: , Rfl:  .  albuterol (PROAIR HFA) 108 (90 Base) MCG/ACT inhaler, Inhale 1 puff into the lungs every 6 (six) hours as needed for wheezing or shortness of breath., Disp: 18 g, Rfl: 3 .  allopurinol (ZYLOPRIM) 300 MG tablet, Take 1 tablet (300 mg total) by mouth daily., Disp: 90 tablet, Rfl: 3 .  aspirin EC 325 MG EC tablet, Take 1 tablet (325 mg total) by mouth daily., Disp: , Rfl:  .  BD PEN NEEDLE NANO 2ND GEN 32G X 4 MM MISC, USE AS DIRECTED, Disp: 100 each, Rfl: 8 .  Cholecalciferol (VITAMIN D) 2000 units CAPS, Take 2,000 Units by mouth every evening. , Disp: , Rfl:  .  ezetimibe (ZETIA) 10 MG tablet, TAKE 1 TABLET BY MOUTH EVERY DAY, Disp: 90 tablet, Rfl: 3 .  glucose blood (ONETOUCH VERIO) test strip, Use as instructed; BID, Disp: 100 each, Rfl: 12 .  insulin aspart (NOVOLOG) 100 UNIT/ML injection, Inject 6 Units into the skin every evening., Disp: 10 mL, Rfl: 1 .  insulin degludec (TRESIBA FLEXTOUCH) 100 UNIT/ML FlexTouch Pen, Take 0.43m/ 0.5 ML Subcutaneously every Tuesday., Disp: 15 mL, Rfl: 5 .  insulin detemir (LEVEMIR) 100 UNIT/ML injection, Inject 0.5 mLs (50 Units total) into the skin daily., Disp: 10 mL, Rfl: 3 .  Insulin Syringe-Needle U-100 30G X 1/2" 1 ML MISC, Use with insulin 3 times daily, Disp: 270 each, Rfl: 3 .  losartan (COZAAR) 50 MG tablet, Take 50 mg by mouth daily.,  Disp: , Rfl:  .  magnesium oxide (MAG-OX) 400 MG tablet, Take 400 mg by mouth 2 (two) times daily. , Disp: , Rfl:  .  metFORMIN (GLUCOPHAGE) 1000 MG tablet, Take 1 tablet (1,000 mg total) by mouth 2 (two) times daily with a meal., Disp: 60 tablet, Rfl: 1 .  metoprolol tartrate (LOPRESSOR) 25 MG tablet, Take 1 tablet (25 mg total) by mouth 2 (two) times daily., Disp: 180 tablet, Rfl: 3 .  nystatin cream (MYCOSTATIN), Apply 1 application topically 2 (two) times daily., Disp: 30 g, Rfl: 6 .  OneTouch Delica Lancets 326VMISC, Use 3 times a day, Disp: 100 each, Rfl: 12 .  rosuvastatin (CRESTOR) 5 MG tablet, Take 1 tablet (5 mg total) by mouth daily., Disp: 90 tablet, Rfl: 3 .  tamsulosin (FLOMAX) 0.4 MG CAPS capsule, TAKE 1 CAPSULE BY MOUTH EVERY DAY, Disp: 90 capsule, Rfl: 3  Past Medical History: Past Medical History:  Diagnosis Date  . Coronary artery disease   . [place] mellitus [place]   . Erectile dysfunction   . Gout   . Heart murmur   . Hyperlipidemia   . Hypertension   . Obesity   . Osteoarthritis   . Vitamin D deficiency     Tobacco Use: Social History   Tobacco Use  Smoking Status Never Smoker  Smokeless Tobacco  Never Used    Labs: Recent Review Flowsheet Data    Labs for ITP Cardiac and Pulmonary Rehab Latest Ref Rng & Units 11/26/2019 11/27/2019 11/27/2019 12/18/2019 02/26/2020   Cholestrol 100 - 199 mg/dL - - - 108 -   LDLCALC 0 - 99 mg/dL - - - 53 -   HDL >39 mg/dL - - - 35(L) -   Trlycerides 0 - 149 mg/dL - - - 110 -   Hemoglobin A1c 4.0 - 5.6 % - - - - 8.2(A)   PHART 7.35 - 7.45 7.309(L) 7.293(L) 7.289(L) - -   PCO2ART 32 - 48 mmHg 50.7(H) 49.7(H) 54.8(H) - -   HCO3 20.0 - 28.0 mmol/L 25.5 24.1 26.2 - -   TCO2 22 - 32 mmol/L '27 26 28 ' - -   ACIDBASEDEF 0.0 - 2.0 mmol/L 1.0 3.0(H) 1.0 - -   O2SAT % 99.0 98.0 97.0 - -       Exercise Target Goals: Exercise Program Goal: Individual exercise prescription set using results from initial 6 min walk  test and THRR while considering  patient's activity barriers and safety.   Exercise Prescription Goal: Initial exercise prescription builds to 30-45 minutes a day of aerobic activity, 2-3 days per week.  Home exercise guidelines will be given to patient during program as part of exercise prescription that the participant will acknowledge.   Education: Aerobic Exercise & Resistance Training: - Gives group verbal and written instruction on the various components of exercise. Focuses on aerobic and resistive training programs and the benefits of this training and how to safely progress through these programs..   Cardiac Rehab from 02/21/2020 in [facility] Cardiac and Pulmonary Rehab  Date 02/07/20  Educator [facility]  Instruction Review Code 1- Verbalizes Understanding      Education: Exercise & Equipment Safety: - Individual verbal instruction and demonstration of equipment use and safety with use of the equipment.   Cardiac Rehab from 02/21/2020 in [facility] Cardiac and Pulmonary Rehab  Date 12/31/19  Educator [facility]  Instruction Review Code 1- Verbalizes Understanding      Education: Exercise Physiology & General Exercise Guidelines: - Group verbal and written instruction with models to review the exercise physiology of the cardiovascular system and associated critical values. Provides general exercise guidelines with specific guidelines to those with heart or lung disease.    Education: Flexibility, Balance, Mind/Body Relaxation: Provides group verbal/written instruction on the benefits of flexibility and balance training, including mind/body exercise modes such as yoga, pilates and tai chi.  Demonstration and skill practice provided.   Cardiac Rehab from 02/21/2020 in [facility] Cardiac and Pulmonary Rehab  Date 02/21/20  Educator [facility]  Instruction Review Code 1- Verbalizes Understanding      Activity Barriers & Risk Stratification:   6 Minute Walk:  6 Minute Walk    Row Name 01/03/20 1315         6  Minute Walk   Phase Initial     Distance 1175 feet     Walk Time 6 minutes     # of Rest Breaks 0     MPH 2.22     METS 2.69     RPE 13     Perceived Dyspnea  0     VO2 Peak 9.42     Symptoms No     Resting HR 67 bpm     Resting BP 128/66     Resting Oxygen Saturation  96 %     Exercise Oxygen Saturation  during  6 min walk 95 %     Max Ex. HR 91 bpm     Max Ex. BP 150/68     2 Minute Post BP 122/64            Oxygen Initial Assessment:   Oxygen Re-Evaluation:   Oxygen Discharge (Final Oxygen Re-Evaluation):   Initial Exercise Prescription:  Initial Exercise Prescription - 01/03/20 1300      Date of Initial Exercise RX and Referring Provider   Date 01/03/20    Referring Provider [name], MD      Treadmill   MPH 1.9    Grade 0.5    Minutes 15    METs 2.59      REL-XR   Level 2    Speed 50    Minutes 15    METs 2.6      T5 Nustep   Level 2    SPM 80    Minutes 15    METs 2.6      Prescription Details   Frequency (times per week) 2    Duration Progress to 30 minutes of continuous aerobic without signs/symptoms of physical distress      Intensity   THRR 40-80% of Max Heartrate 103-139    Ratings of Perceived Exertion 11-13    Perceived Dyspnea 0-4      Progression   Progression Continue to progress workloads to maintain intensity without signs/symptoms of physical distress.      Resistance Training   Training Prescription Yes    Weight 3 lb    Reps 10-15           Perform Capillary Blood Glucose checks as needed.  Exercise Prescription Changes:  Exercise Prescription Changes    Row Name 01/03/20 1300 01/21/20 1700 02/05/20 0900 02/18/20 1300       Response to Exercise   Blood Pressure (Admit) 128/66 142/72 134/62 130/78    Blood Pressure (Exercise) 150/68 144/68 124/60 140/60    Blood Pressure (Exit) 122/64 120/64 130/60 118/66    Heart Rate (Admit) 67 bpm 71 bpm 70 bpm 78 bpm    Heart Rate (Exercise) 91 bpm 91 bpm 81 bpm 89  bpm    Heart Rate (Exit) 67 bpm 63 bpm 59 bpm 66 bpm    Oxygen Saturation (Admit) 96 % -- -- --    Oxygen Saturation (Exercise) 95 % -- -- --    Oxygen Saturation (Exit) 97 % -- -- --    Rating of Perceived Exertion (Exercise) '13 12 12 12    ' Perceived Dyspnea (Exercise) 0 -- -- --    Symptoms None none none none    Comments Walk Test Results -- -- --    Duration -- Continue with 30 min of aerobic exercise without signs/symptoms of physical distress. Continue with 30 min of aerobic exercise without signs/symptoms of physical distress. Continue with 30 min of aerobic exercise without signs/symptoms of physical distress.    Intensity -- THRR unchanged THRR unchanged THRR unchanged      Progression   Progression -- Continue to progress workloads to maintain intensity without signs/symptoms of physical distress. Continue to progress workloads to maintain intensity without signs/symptoms of physical distress. Continue to progress workloads to maintain intensity without signs/symptoms of physical distress.    Average METs -- 2.5 2.53 2.5      Resistance Training   Training Prescription -- Yes Yes Yes    Weight -- 3 lb 3 lb 4 lb    Reps --  10-15 10-15 10-15      Interval Training   Interval Training No No No No      Treadmill   MPH -- 1.9 1.9 1.8    Grade -- 0.5 0.5 0.5    Minutes -- '15 15 15    ' METs -- 2.6 2.59 2.59      REL-XR   Level -- -- 2 --    Minutes -- -- 15 --    METs -- -- 2.2 --      T5 Nustep   Level -- '2 2 2    ' SPM -- 80 -- 80    Minutes -- '15 15 15    ' METs -- 2.5 2.8 2.4      Home Exercise Plan   Plans to continue exercise at -- -- Home (comment)  walking and staff videos Home (comment)  walking and staff videos    Frequency -- -- Add 2 additional days to program exercise sessions. Add 2 additional days to program exercise sessions.    Initial Home Exercises Provided -- -- 02/05/20 02/05/20           Exercise Comments:   Exercise Goals and Review:   Exercise Goals    Row Name 01/03/20 1044             Exercise Goals   Increase Physical Activity Yes       Intervention Provide advice, education, support and counseling about physical activity/exercise needs.;Develop an individualized exercise prescription for aerobic and resistive training based on initial evaluation findings, risk stratification, comorbidities and participant's personal goals.       Expected Outcomes Short Term: Attend rehab on a regular basis to increase amount of physical activity.;Long Term: Add in home exercise to make exercise part of routine and to increase amount of physical activity.;Long Term: Exercising regularly at least 3-5 days a week.       Increase Strength and Stamina Yes       Intervention Provide advice, education, support and counseling about physical activity/exercise needs.;Develop an individualized exercise prescription for aerobic and resistive training based on initial evaluation findings, risk stratification, comorbidities and participant's personal goals.       Expected Outcomes Short Term: Increase workloads from initial exercise prescription for resistance, speed, and METs.;Short Term: Perform resistance training exercises routinely during rehab and add in resistance training at home;Long Term: Improve cardiorespiratory fitness, muscular endurance and strength as measured by increased METs and functional capacity (6MWT)       Able to understand and use rate of perceived exertion (RPE) scale Yes       Intervention Provide education and explanation on how to use RPE scale       Expected Outcomes Short Term: Able to use RPE daily in rehab to express subjective intensity level;Long Term:  Able to use RPE to guide intensity level when exercising independently       Able to understand and use Dyspnea scale Yes       Intervention Provide education and explanation on how to use Dyspnea scale       Expected Outcomes Short Term: Able to use Dyspnea scale daily  in rehab to express subjective sense of shortness of breath during exertion;Long Term: Able to use Dyspnea scale to guide intensity level when exercising independently       Knowledge and understanding of Target Heart Rate Range (THRR) Yes       Intervention Provide education and explanation of THRR including how the numbers  were predicted and where they are located for reference       Expected Outcomes Short Term: Able to state/look up THRR;Short Term: Able to use daily as guideline for intensity in rehab;Long Term: Able to use THRR to govern intensity when exercising independently       Able to check pulse independently Yes       Intervention Provide education and demonstration on how to check pulse in carotid and radial arteries.;Review the importance of being able to check your own pulse for safety during independent exercise       Expected Outcomes Short Term: Able to explain why pulse checking is important during independent exercise;Long Term: Able to check pulse independently and accurately       Understanding of Exercise Prescription Yes       Intervention Provide education, explanation, and written materials on patient's individual exercise prescription       Expected Outcomes Short Term: Able to explain program exercise prescription;Long Term: Able to explain home exercise prescription to exercise independently              Exercise Goals Re-Evaluation :  Exercise Goals Re-Evaluation    Row Name 01/21/20 1750 02/05/20 0819 02/18/20 1315         Exercise Goal Re-Evaluation   Exercise Goals Review Increase Physical Activity;Increase Strength and Stamina;Understanding of Exercise Prescription Increase Physical Activity;Increase Strength and Stamina;Understanding of Exercise Prescription Increase Physical Activity;Increase Strength and Stamina;Understanding of Exercise Prescription     Comments Jeani Hawking is tolerating exercise well in his first two weeks of exercise. Staff will monitor  progress. Jeani Hawking is doing well in rehab.  He has already started to notice that things are getting easier. Reviewed home exercise with pt today.  Pt plans to walk and use staff videos at home for exercise.  Reviewed THR, pulse, RPE, sign and symptoms, pulse oximetery and when to call 911 or MD.  Also discussed weather considerations and indoor options.  Pt voiced understanding. Jeani Hawking attends consistently.  He works at [employer] quite reach THR range.  Staff will encourage him to increase speed or grade on TM and resistance on machines.  he has increased to 4 lb or strength work.     Expected Outcomes Short:  continue to attend consistently Long:  build strength and stamina Short: Start to add in more walking at home again  Long: Continue to build stamina. Short: increase levels to rach THR range Long: increase stamina            Discharge Exercise Prescription (Final Exercise Prescription Changes):  Exercise Prescription Changes - 02/18/20 1300      Response to Exercise   Blood Pressure (Admit) 130/78    Blood Pressure (Exercise) 140/60    Blood Pressure (Exit) 118/66    Heart Rate (Admit) 78 bpm    Heart Rate (Exercise) 89 bpm    Heart Rate (Exit) 66 bpm    Rating of Perceived Exertion (Exercise) 12    Symptoms none    Duration Continue with 30 min of aerobic exercise without signs/symptoms of physical distress.    Intensity THRR unchanged      Progression   Progression Continue to progress workloads to maintain intensity without signs/symptoms of physical distress.    Average METs 2.5      Resistance Training   Training Prescription Yes    Weight 4 lb    Reps 10-15      Interval Training   Interval Training  No      Treadmill   MPH 1.8    Grade 0.5    Minutes 15    METs 2.59      T5 Nustep   Level 2    SPM 80    Minutes 15    METs 2.4      Home Exercise Plan   Plans to continue exercise at Home (comment)   walking and staff videos   Frequency Add 2 additional  days to program exercise sessions.    Initial Home Exercises Provided 02/05/20           Nutrition:  Target Goals: Understanding of nutrition guidelines, daily intake of sodium <152m, cholesterol <2023m calories 30% from fat and 7% or less from saturated fats, daily to have 5 or more servings of fruits and vegetables.  Education: Controlling Sodium/Reading Food Labels -Group verbal and written material supporting the discussion of sodium use in heart healthy nutrition. Review and explanation with models, verbal and written materials for utilization of the food label.   Education: General Nutrition Guidelines/Fats and Fiber: -Group instruction provided by verbal, written material, models and posters to present the general guidelines for heart healthy nutrition. Gives an explanation and review of dietary fats and fiber.   Biometrics:  Pre Biometrics - 01/03/20 1027      Pre Biometrics   Height 5' 10.75" (1.797 m)    Weight 253 lb 11.2 oz (115.1 kg)    BMI (Calculated) 35.64    Single Leg Stand 2.44 seconds            Nutrition Therapy Plan and Nutrition Goals:  Nutrition Therapy & Goals - 01/22/20 0755      Nutrition Therapy   Diet Heart healthy, low Na, T2DM    Drug/Food Interactions Food/Disease   Insulin - Levemir and Novolog   Protein (specify units) 95-100g    Fiber 30 grams    Whole Grain Foods 3 servings    Saturated Fats 12 max. grams    Fruits and Vegetables 5 servings/day    Sodium 1.5 grams      Personal Nutrition Goals   Nutrition Goal ST: utilize MyPlate to include more whole grains/ starchy vegetables he enjoys, use spreadable margarine vs stick margarine, continue with current chnages LT: continue with heart healthy changes and lower BG    Comments CHO 60g/meal - 200g/day. Trying to avoid starchy vegetables and grains. B: 2 pieces of whole wheat toast with yogurt L: tuna cottage cheese and avocado and salads or leftover D: chicken and vegetables (heart  healthy cooking methods) - margarine (stick) - small amount. BG: ~150-170. Levemir and novolog, recently lowered insulin. One at dinner, not at breakfast and lunch. Discussed heart healthy eating and T2DM friendly eating - discussed whole grains and MyPlate      Intervention Plan   Intervention Prescribe, educate and counsel regarding individualized specific dietary modifications aiming towards targeted core components such as weight, hypertension, lipid management, diabetes, heart failure and other comorbidities.;Nutrition handout(s) given to patient.    Expected Outcomes Short Term Goal: Understand basic principles of dietary content, such as calories, fat, sodium, cholesterol and nutrients.;Short Term Goal: A plan has been developed with personal nutrition goals set during dietitian appointment.;Long Term Goal: Adherence to prescribed nutrition plan.           Nutrition Assessments:  Nutrition Assessments - 01/03/20 1039      MEDFICTS Scores   Pre Score 39  MEDIFICTS Score Key:          ?70 Need to make dietary changes          40-70 Heart Healthy Diet         ? 40 Therapeutic Level Cholesterol Diet  Nutrition Goals Re-Evaluation:  Nutrition Goals Re-Evaluation    [place] Name 02/26/20 0903             Goals   Nutrition Goal ST: utilize MyPlate to include more whole grains/ starchy vegetables he enjoys, use spreadable margarine vs stick margarine, continue with current chnages LT: continue with heart healthy changes and lower BG       Comment Continue with current changes       Expected Outcome ST: utilize MyPlate to include more whole grains/ starchy vegetables he enjoys, use spreadable margarine vs stick margarine, continue with current chnages LT: continue with heart healthy changes and lower BG              Nutrition Goals Discharge (Final Nutrition Goals Re-Evaluation):  Nutrition Goals Re-Evaluation - 02/26/20 0903      Goals   Nutrition Goal ST: utilize  MyPlate to include more whole grains/ starchy vegetables he enjoys, use spreadable margarine vs stick margarine, continue with current chnages LT: continue with heart healthy changes and lower BG    Comment Continue with current changes    Expected Outcome ST: utilize MyPlate to include more whole grains/ starchy vegetables he enjoys, use spreadable margarine vs stick margarine, continue with current chnages LT: continue with heart healthy changes and lower BG           Psychosocial: Target Goals: Acknowledge presence or absence of significant depression and/or stress, maximize coping skills, provide positive support system. Participant is able to verbalize types and ability to use techniques and skills needed for reducing stress and depression.   Education: Depression - Provides group verbal and written instruction on the correlation between heart/lung disease and depressed mood, treatment options, and the stigmas associated with seeking treatment.   Education: Sleep Hygiene -Provides group verbal and written instruction about how sleep can affect your health.  Define sleep hygiene, discuss sleep cycles and impact of sleep habits. Review good sleep hygiene tips.     Education: Stress and Anxiety: - Provides group verbal and written instruction about the health risks of elevated stress and causes of high stress.  Discuss the correlation between heart/lung disease and anxiety and treatment options. Review healthy ways to manage with stress and anxiety.    Initial Review & Psychosocial Screening:  Initial Psych Review & Screening - 12/31/19 1434      Initial Review   Current issues with None Identified      Family Dynamics   Good Support System? Yes    Comments He can look to his wife and son.      Barriers   Psychosocial barriers to participate in program The patient should benefit from training in stress management and relaxation.;There are no identifiable barriers or psychosocial  needs.      Screening Interventions   Interventions Encouraged to exercise    Expected Outcomes Short Term goal: Utilizing psychosocial counselor, staff and physician to assist with identification of specific Stressors or current issues interfering with healing process. Setting desired goal for each stressor or current issue identified.;Long Term Goal: Stressors or current issues are controlled or eliminated.;Short Term goal: Identification and review with participant of any Quality of Life or Depression concerns found by scoring the  questionnaire.;Long Term goal: The participant improves quality of Life and PHQ9 Scores as seen by post scores and/or verbalization of changes           Quality of Life Scores:   Quality of Life - 01/03/20 1043      Quality of Life   Select Quality of Life      Quality of Life Scores   Health/Function Pre 21.27 %    Socioeconomic Pre 19.36 %    Psych/Spiritual Pre 16.79 %    Family Pre 24.9 %    GLOBAL Pre 20.49 %          Scores of 19 and below usually indicate a poorer quality of life in these areas.  A difference of  2-3 points is a clinically meaningful difference.  A difference of 2-3 points in the total score of the Quality of Life Index has been associated with significant improvement in overall quality of life, self-image, physical symptoms, and general health in studies assessing change in quality of life.  PHQ-9: Recent Review Flowsheet Data    Depression screen [facility] 2/9 02/05/2020 01/03/2020 07/16/2019 06/28/2017 02/14/2017   Decreased Interest 0 1 0 0 0   Down, Depressed, Hopeless 0 1 0 0 0   PHQ - 2 Score 0 2 0 0 0   Altered sleeping 1 1 0 0 -   Tired, decreased energy 1 1 0 0 -   Change in appetite 1  0 0 0 -   Feeling bad or failure about yourself  0 0 0 0 -   Trouble concentrating 0 1 0 0 -   Moving slowly or fidgety/restless 0 0 0 0 -   Suicidal thoughts 0 0 0 0 -   PHQ-9 Score 3 5 0 0 -   Difficult doing work/chores Not difficult at  all Not difficult at all Not difficult at all Not difficult at all -     Interpretation of Total Score  Total Score Depression Severity:  1-4 = Minimal depression, 5-9 = Mild depression, 10-14 = Moderate depression, 15-19 = Moderately severe depression, 20-27 = Severe depression   Psychosocial Evaluation and Intervention:  Psychosocial Evaluation - 12/31/19 1435      Psychosocial Evaluation & Interventions   Interventions Encouraged to exercise with the program and follow exercise prescription    Comments He can look to his wife and son. He has no other concerns other than everyday life.    Expected Outcomes Short: Exercise regularly to support mental health and notify staff of any changes. Long: maintain mental health and well being through teaching of rehab or prescribed medications independently.    Continue Psychosocial Services  Follow up required by staff           Psychosocial Re-Evaluation:  Psychosocial Re-Evaluation    [place] Name 02/05/20 0809             Psychosocial Re-Evaluation   Current issues with Current Sleep Concerns;Current Stress Concerns       Comments Jeani Hawking is doing well in rehab.  His PHQ score has gone down.  He admitted to cheating on his diet when the grandkids visit.  He enjoys them, but they wreck his diet.  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  He needs to be tired to get to sleep.  He also will wake up early some days.  He has never really tried anything to help with sleep and  no interest in trying. He continues to worry about his mom in her 8s living by herself and recently had flooding from her dishwasher.       Expected Outcomes Short: Enjoy grandkids  Long: Continue to focus on positive.       Interventions Encouraged to attend Cardiac Rehabilitation for the exercise       Continue Psychosocial Services  Follow up required by staff              Psychosocial Discharge (Final Psychosocial  Re-Evaluation):  Psychosocial Re-Evaluation - 02/05/20 0809      Psychosocial Re-Evaluation   Current issues with Current Sleep Concerns;Current Stress Concerns    Comments Jeani Hawking is doing well in rehab.  His PHQ score has gone down.  He admitted to cheating on his diet when the grandkids visit.  He enjoys them, but they wreck his diet.  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  He needs to be tired to get to sleep.  He also will wake up early some days.  He has never really tried anything to help with sleep and no interest in trying. He continues to worry about his mom in her 2s living by herself and recently had flooding from her dishwasher.    Expected Outcomes Short: Enjoy grandkids  Long: Continue to focus on positive.    Interventions Encouraged to attend Cardiac Rehabilitation for the exercise    Continue Psychosocial Services  Follow up required by staff           Vocational Rehabilitation: Provide vocational rehab assistance to qualifying candidates.   Vocational Rehab Evaluation & Intervention:   Education: Education Goals: Education classes will be provided on a variety of topics geared toward better understanding of heart health and risk factor modification. Participant will state understanding/return demonstration of topics presented as noted by education test scores.  Learning Barriers/Preferences:  Learning Barriers/Preferences - 12/31/19 1432      Learning Barriers/Preferences   Learning Barriers None    Learning Preferences None           General Cardiac Education Topics:  AED/CPR: - Group verbal and written instruction with the use of models to demonstrate the basic use of the AED with the basic ABC's of resuscitation.   Anatomy & Physiology of the Heart: - Group verbal and written instruction and models provide basic cardiac anatomy and physiology, with the coronary electrical and arterial systems. Review of Valvular  disease and Heart Failure   Cardiac Rehab from 02/21/2020 in [facility] Cardiac and Pulmonary Rehab  Date 02/14/20  Educator [facility]  Instruction Review Code 1- Verbalizes Understanding      Cardiac Procedures: - Group verbal and written instruction to review commonly prescribed medications for heart disease. Reviews the medication, class of the drug, and side effects. Includes the steps to properly store meds and maintain the prescription regimen. (beta blockers and nitrates)   Cardiac Rehab from 02/21/2020 in [facility] Cardiac and Pulmonary Rehab  Date 02/14/20  Educator [facility]  Instruction Review Code 1- Verbalizes Understanding      Cardiac Medications I: - Group verbal and written instruction to review commonly prescribed medications for heart disease. Reviews the medication, class of the drug, and side effects. Includes the steps to properly store meds and maintain the prescription regimen.   Cardiac Rehab from 02/21/2020 in [facility] Cardiac and Pulmonary Rehab  Date 01/17/20  Educator SB  Instruction Review Code 1- Verbalizes Understanding  Cardiac Medications II: -Group verbal and written instruction to review commonly prescribed medications for heart disease. Reviews the medication, class of the drug, and side effects. (all other drug classes)   Cardiac Rehab from 02/21/2020 in [facility] Cardiac and Pulmonary Rehab  Date 01/31/20  Educator SB  Instruction Review Code 1- Verbalizes Understanding       Go Sex-Intimacy & Heart Disease, Get SMART - Goal Setting: - Group verbal and written instruction through game format to discuss heart disease and the return to sexual intimacy. Provides group verbal and written material to discuss and apply goal setting through the application of the S.M.A.R.T. Method.   Cardiac Rehab from 02/21/2020 in [facility] Cardiac and Pulmonary Rehab  Date 02/14/20  Educator [facility]  Instruction Review Code 1- Verbalizes Understanding      Other Matters of the Heart: - Provides group  verbal, written materials and models to describe Stable Angina and Peripheral Artery. Includes description of the disease process and treatment options available to the cardiac patient.   Infection Prevention: - Provides verbal and written material to individual with discussion of infection control including proper hand washing and proper equipment cleaning during exercise session.   Cardiac Rehab from 02/21/2020 in [facility] Cardiac and Pulmonary Rehab  Date 12/31/19  Educator [facility]  Instruction Review Code 1- Verbalizes Understanding      Falls Prevention: - Provides verbal and written material to individual with discussion of falls prevention and safety.   Cardiac Rehab from 02/21/2020 in [facility] Cardiac and Pulmonary Rehab  Date 12/31/19  Educator [facility]  Instruction Review Code 1- Verbalizes Understanding      Other: -Provides group and verbal instruction on various topics (see comments)   Knowledge Questionnaire Score:  Knowledge Questionnaire Score - 01/03/20 1038      Knowledge Questionnaire Score   Pre Score 20/26: Heart Disease,Nutrition, Exercise           Core Components/Risk Factors/Patient Goals at Admission:  Personal Goals and Risk Factors at Admission - 01/03/20 1045      Core Components/Risk Factors/Patient Goals on Admission    Weight Management Yes;Obesity;Weight Loss    Intervention Weight Management: Provide education and appropriate resources to help participant work on and attain dietary goals.;Weight Management: Develop a combined nutrition and exercise program designed to reach desired caloric intake, while maintaining appropriate intake of nutrient and fiber, sodium and fats, and appropriate energy expenditure required for the weight goal.;Weight Management/Obesity: Establish reasonable short term and long term weight goals.;Obesity: Provide education and appropriate resources to help participant work on and attain dietary goals.    Admit Weight 253 lb 11.2 oz (115.1  kg)    Goal Weight: Short Term 248 lb (112.5 kg)    Goal Weight: Long Term 243 lb (110.2 kg)    Expected Outcomes Short Term: Continue to assess and modify interventions until short term weight is achieved;Long Term: Adherence to nutrition and physical activity/exercise program aimed toward attainment of established weight goal;Weight Maintenance: Understanding of the daily nutrition guidelines, which includes 25-35% calories from fat, 7% or less cal from saturated fats, less than 285m cholesterol, less than 1.5gm of sodium, & 5 or more servings of fruits and vegetables daily;Weight Loss: Understanding of general recommendations for a balanced deficit meal plan, which promotes 1-2 lb weight loss per week and includes a negative energy balance of [phone] kcal/d;Understanding recommendations for meals to include 15-35% energy as protein, 25-35% energy from fat, 35-60% energy from carbohydrates, less than 2039mof dietary cholesterol, 20-35  gm of total fiber daily;Understanding of distribution of calorie intake throughout the day with the consumption of 4-5 meals/snacks    Diabetes Yes    Intervention Provide education about signs/symptoms and action to take for hypo/hyperglycemia.;Provide education about proper nutrition, including hydration, and aerobic/resistive exercise prescription along with prescribed medications to achieve blood glucose in normal ranges: Fasting glucose 65-99 mg/dL    Expected Outcomes Short Term: Participant verbalizes understanding of the signs/symptoms and immediate care of hyper/hypoglycemia, proper foot care and importance of medication, aerobic/resistive exercise and nutrition plan for blood glucose control.;Long Term: Attainment of HbA1C < 7%.    Hypertension Yes    Intervention Provide education on lifestyle modifcations including regular physical activity/exercise, weight management, moderate sodium restriction and increased consumption of fresh fruit, vegetables, and low fat  dairy, alcohol moderation, and smoking cessation.;Monitor prescription use compliance.    Expected Outcomes Short Term: Continued assessment and intervention until BP is < 140/33m HG in hypertensive participants. < 130/844mHG in hypertensive participants with diabetes, heart failure or chronic kidney disease.;Long Term: Maintenance of blood pressure at goal levels.    Lipids Yes    Intervention Provide education and support for participant on nutrition & aerobic/resistive exercise along with prescribed medications to achieve LDL <7043mHDL >59m15m  Expected Outcomes Short Term: Participant states understanding of desired cholesterol values and is compliant with medications prescribed. Participant is following exercise prescription and nutrition guidelines.;Long Term: Cholesterol controlled with medications as prescribed, with individualized exercise RX and with personalized nutrition plan. Value goals: LDL < 70mg31mL > 40 mg.           Education:Diabetes - Individual verbal and written instruction to review signs/symptoms of diabetes, desired ranges of glucose level fasting, after meals and with exercise. Acknowledge that pre and post exercise glucose checks will be done for 3 sessions at entry of program.   [facility]  Date 12/31/19  [facility] Code 1- Verbalizes Understanding      Education: Know Your Numbers and Risk Factors: -Group verbal and written instruction about important numbers in your health.  Discussion of what are risk factors and how they play a role in the disease process.  Review of Cholesterol, Blood Pressure, Diabetes, and BMI and the role they play in your overall health.   [facility]  Date 01/31/20  Educator SB  Instruction Review Code 1- Verbalizes Understanding      Core Components/Risk Factors/Patient Goals Review:   Goals and Risk  Factor Review    Row Name 02/05/20 0810             Core Components/Risk Factors/Patient Goals Review   Personal Goals Review Weight Management/Obesity;Hypertension;Lipids;Diabetes       Review Lynn Jeani Hawkingbeen doing well in rehab.  He admitted to cheating some on his diet when the grandkids visit as his wife buys all the good stuff and he has no will power.  He is trying to do the best he can and does follow some weight watchers ideas. His blood pressures have been good in class.  He does not check them at home as he does not have a cuff. His blood sugars have done well in rehab, but a little off at home.  He has changed some of his meds and has noticed that they have been running higher in the 200s in the morning.  He is  going to talk with doctor more about it and work on his diet.       Expected Outcomes Short: Get sugars back under control  Long; Continue to work on risk factors.              Core Components/Risk Factors/Patient Goals at Discharge (Final Review):   Goals and Risk Factor Review - 02/05/20 0810      Core Components/Risk Factors/Patient Goals Review   Personal Goals Review Weight Management/Obesity;Hypertension;Lipids;Diabetes    Review Jeani Hawking has been doing well in rehab.  He admitted to cheating some on his diet when the grandkids visit as his wife buys all the good stuff and he has no will power.  He is trying to do the best he can and does follow some weight watchers ideas. His blood pressures have been good in class.  He does not check them at home as he does not have a cuff. His blood sugars have done well in rehab, but a little off at home.  He has changed some of his meds and has noticed that they have been running higher in the 200s in the morning.  He is going to talk with doctor more about it and work on his diet.    Expected Outcomes Short: Get sugars back under control  Long; Continue to work on risk factors.           ITP Comments:  ITP Comments    Row Name  12/31/19 1437 01/03/20 1011 01/30/20 0607 02/27/20 1519     ITP Comments Virtual Visit completed. Patient informed on EP and RD appointment and 6 Minute walk test. Patient also informed of patient health questionnaires on My Chart. Patient Verbalizes understanding. Visit diagnosis can be found in [facility] 11/26/2019. Completed 6MWT and gym orientation. Initial ITP created and sent for review to Dr. Emily Filbert, Medical Director. 30 Day review completed. Medical Director ITP review done, changes made as directed, and signed approval by Medical Director. 30 day review completed. ITP sent to Dr. Emily Filbert, Medical Director of Cardiac and Pulmonary Rehab. Continue with ITP unless changes are made by physician.           Comments: 30 day review

## 2020-02-28 ENCOUNTER — Encounter: Payer: No Typology Code available for payment source | Admitting: *Deleted

## 2020-02-28 ENCOUNTER — Other Ambulatory Visit: Payer: Self-pay

## 2020-02-28 DIAGNOSIS — Z951 Presence of aortocoronary bypass graft: Secondary | ICD-10-CM

## 2020-02-28 NOTE — Progress Notes (Signed)
Daily Session Note  Patient Details  Name: Jeffery West MRN: [id number] Date of Birth: 1956/03/21 Referring Provider:     Cardiac Rehab from 01/03/2020 in [facility] Cardiac and Pulmonary Rehab  Referring Provider [name], MD      Encounter Date: 02/28/2020  Check In:  Session Check In - 02/28/20 1558      Check-In   Supervising physician immediately available to respond to emergencies See telemetry face sheet for immediately available ER MD    Location [facility]-Cardiac & Pulmonary Rehab    Staff Present Renita Papa, RN BSN;Joseph Hood RCP,RRT,BSRT;Amanda [place], [place], ACSM CEP, Exercise Physiologist    Virtual Visit No    Medication changes reported     Yes    Comments off Levemir and starting Tresiba    Fall or balance concerns reported    No    Warm-up and Cool-down Performed on first and last piece of equipment    Resistance Training Performed Yes    VAD Patient? No    PAD/SET Patient? No      Pain Assessment   Currently in Pain? No/denies              Social History   Tobacco Use  Smoking Status Never Smoker  Smokeless Tobacco Never Used    Goals Met:  Independence with exercise equipment Exercise tolerated well No report of cardiac concerns or symptoms Strength training completed today  Goals Unmet:  Not Applicable  Comments: Pt able to follow exercise prescription today without complaint.  Will continue to monitor for progression.    Dr. Emily Filbert is Medical Director for [place] and [facility].

## 2020-03-03 ENCOUNTER — Other Ambulatory Visit: Payer: Self-pay

## 2020-03-03 ENCOUNTER — Encounter: Payer: No Typology Code available for payment source | Admitting: *Deleted

## 2020-03-03 DIAGNOSIS — Z951 Presence of aortocoronary bypass graft: Secondary | ICD-10-CM

## 2020-03-03 NOTE — Progress Notes (Signed)
Daily Session Note  Patient Details  Name: Jeffery West MRN: [id number] Date of Birth: 01/11/1956 Referring Provider:     Cardiac Rehab from 01/03/2020 in [facility] Cardiac and Pulmonary Rehab  Referring Provider [name], MD      Encounter Date: 03/03/2020  Check In:  Session Check In - 03/03/20 1609      Check-In   Supervising physician immediately available to respond to emergencies See telemetry face sheet for immediately available ER MD    Location [facility]-Cardiac & Pulmonary Rehab    Staff Present Renita Papa, RN Margurite Auerbach, MS Exercise Physiologist;Kelly Amedeo Plenty, BS, ACSM CEP, Exercise Physiologist    Virtual Visit No    Medication changes reported     No    Fall or balance concerns reported    No    Warm-up and Cool-down Performed on first and last piece of equipment    Resistance Training Performed Yes    VAD Patient? No    PAD/SET Patient? No      Pain Assessment   Currently in Pain? No/denies              Social History   Tobacco Use  Smoking Status Never Smoker  Smokeless Tobacco Never Used    Goals Met:  Independence with exercise equipment Exercise tolerated well No report of cardiac concerns or symptoms Strength training completed today  Goals Unmet:  Not Applicable  Comments: Pt able to follow exercise prescription today without complaint.  Will continue to monitor for progression.    Dr. Emily Filbert is Medical Director for [place] and [facility].

## 2020-03-04 ENCOUNTER — Telehealth: Payer: Self-pay | Admitting: Family Medicine

## 2020-03-04 DIAGNOSIS — E119 Type 2 diabetes mellitus without complications: Secondary | ICD-10-CM

## 2020-03-04 MED ORDER — METFORMIN HCL 1000 MG PO TABS: 1000.0000 mg | ORAL_TABLET | Freq: Two times a day (BID) | ORAL | 0 refills | Status: DC

## 2020-03-04 NOTE — Telephone Encounter (Signed)
1. Pt states he thought Dr Rosanna Randy was going to send in new Rx for trulicity Dr gave him 2 trulicity sample pens.  But new rx not at the pharmacy.   2. Pt states his insurance will not fill anything but a 90 day supply.  They would not refill his metFORMIN (GLUCOPHAGE) 1000 MG tablet  Please send in a 90 day supply of this to   [id number] - [place], [place] - [address] Phone:  [phone]  Fax:  [phone]

## 2020-03-04 NOTE — Telephone Encounter (Signed)
Pt called to see if Trulicity would be prescribed and sent to the pharmacy / Pt was given two sample pens and wanted to know if Dr. Rosanna Randy was going to have him continue using / Pt was wanted to make sure he didn't misunderstand/ please advise   Pt would have to have it by next Tuesday if Dr. Rosanna Randy wants him to take it weekly

## 2020-03-04 NOTE — Telephone Encounter (Signed)
Do not see Trulicity in active med list. Please order if appropriate- please send to [place]

## 2020-03-05 ENCOUNTER — Other Ambulatory Visit: Payer: Self-pay

## 2020-03-05 ENCOUNTER — Encounter: Payer: No Typology Code available for payment source | Admitting: *Deleted

## 2020-03-05 DIAGNOSIS — Z951 Presence of aortocoronary bypass graft: Secondary | ICD-10-CM | POA: Diagnosis not present

## 2020-03-05 NOTE — Progress Notes (Signed)
Daily Session Note  Patient Details  Name: Jeffery West MRN: [id number] Date of Birth: 06/16/1956 Referring Provider:     Cardiac Rehab from 01/03/2020 in [facility] Cardiac and Pulmonary Rehab  Referring Provider [name], MD      Encounter Date: 03/05/2020  Check In:  Session Check In - 03/05/20 1607      Check-In   Supervising physician immediately available to respond to emergencies See telemetry face sheet for immediately available ER MD    Location [facility]-Cardiac & Pulmonary Rehab    Staff Present Renita Papa, RN BSN;Joseph Lou Miner, [place] Exercise Physiologist;Melissa Tilford Pillar RDN, LDN    Virtual Visit No    Medication changes reported     No    Fall or balance concerns reported    No    Warm-up and Cool-down Performed on first and last piece of equipment    Resistance Training Performed Yes    VAD Patient? No    PAD/SET Patient? No      Pain Assessment   Currently in Pain? No/denies              Social History   Tobacco Use  Smoking Status Never Smoker  Smokeless Tobacco Never Used    Goals Met:  Independence with exercise equipment Exercise tolerated well No report of cardiac concerns or symptoms Strength training completed today  Goals Unmet:  Not Applicable  Comments: Pt able to follow exercise prescription today without complaint.  Will continue to monitor for progression.    Dr. Emily Filbert is Medical Director for [place] and [facility].

## 2020-03-10 ENCOUNTER — Encounter: Payer: No Typology Code available for payment source | Admitting: *Deleted

## 2020-03-10 ENCOUNTER — Other Ambulatory Visit: Payer: Self-pay

## 2020-03-10 DIAGNOSIS — Z951 Presence of aortocoronary bypass graft: Secondary | ICD-10-CM | POA: Diagnosis not present

## 2020-03-10 NOTE — Progress Notes (Signed)
Daily Session Note  Patient Details  Name: Jeffery West MRN: [id number] Date of Birth: June 03, 1956 Referring Provider:     Cardiac Rehab from 01/03/2020 in [facility] Cardiac and Pulmonary Rehab  Referring Provider [name], MD      Encounter Date: 03/10/2020  Check In:  Session Check In - 03/10/20 1607      Check-In   Supervising physician immediately available to respond to emergencies See telemetry face sheet for immediately available ER MD    Location [facility]-Cardiac & Pulmonary Rehab    Staff Present Earlean Shawl, BS, ACSM CEP, Exercise Physiologist;Alantis Bethune Sherryll Burger, RN Margurite Auerbach, MS Exercise Physiologist    Virtual Visit No    Medication changes reported     No    Fall or balance concerns reported    No    Warm-up and Cool-down Performed on first and last piece of equipment    Resistance Training Performed Yes    VAD Patient? No    PAD/SET Patient? No      Pain Assessment   Currently in Pain? No/denies              Social History   Tobacco Use  Smoking Status Never Smoker  Smokeless Tobacco Never Used    Goals Met:  Independence with exercise equipment Exercise tolerated well No report of cardiac concerns or symptoms Strength training completed today  Goals Unmet:  Not Applicable  Comments: Pt able to follow exercise prescription today without complaint.  Will continue to monitor for progression.    Dr. Emily Filbert is Medical Director for [name] and [facility].

## 2020-03-12 ENCOUNTER — Other Ambulatory Visit: Payer: Self-pay

## 2020-03-12 ENCOUNTER — Encounter: Payer: No Typology Code available for payment source | Admitting: *Deleted

## 2020-03-12 DIAGNOSIS — Z951 Presence of aortocoronary bypass graft: Secondary | ICD-10-CM

## 2020-03-12 NOTE — Progress Notes (Signed)
Daily Session Note  Patient Details  Name: TIONNE DAYHOFF MRN: [id number] Date of Birth: [birth year] Referring Provider:     Cardiac Rehab from 01/03/2020 in [facility] Cardiac and Pulmonary Rehab  Referring Provider [name], MD      Encounter Date: 03/12/2020  Check In:  Session Check In - 03/12/20 1605      Check-In   Supervising physician immediately available to respond to emergencies See telemetry face sheet for immediately available ER MD    Location [facility]-Cardiac & Pulmonary Rehab    Staff Present Justin Mend RCP,RRT,BSRT;Ajdin Macke Sherryll Burger, RN Margurite Auerbach, MS Exercise Physiologist    Virtual Visit No    Medication changes reported     No    Fall or balance concerns reported    No    Warm-up and Cool-down Performed on first and last piece of equipment    Resistance Training Performed Yes    VAD Patient? No    PAD/SET Patient? No      Pain Assessment   Currently in Pain? No/denies              Social History   Tobacco Use  Smoking Status Never Smoker  Smokeless Tobacco Never Used    Goals Met:  Independence with exercise equipment Exercise tolerated well No report of cardiac concerns or symptoms Strength training completed today  Goals Unmet:  Not Applicable  Comments: Pt able to follow exercise prescription today without complaint.  Will continue to monitor for progression.    Dr. Emily Filbert is Medical Director for [place] and [facility].

## 2020-03-17 ENCOUNTER — Other Ambulatory Visit: Payer: Self-pay

## 2020-03-17 ENCOUNTER — Encounter: Payer: No Typology Code available for payment source | Admitting: *Deleted

## 2020-03-17 DIAGNOSIS — Z951 Presence of aortocoronary bypass graft: Secondary | ICD-10-CM | POA: Diagnosis not present

## 2020-03-17 MED ORDER — TRULICITY 0.75 MG/0.5ML ~~LOC~~ SOAJ: 0.7500 mg | SUBCUTANEOUS | 12 refills | Status: DC

## 2020-03-17 NOTE — Telephone Encounter (Signed)
Trulicity 8.34 subcu weekly

## 2020-03-17 NOTE — Progress Notes (Signed)
Daily Session Note  Patient Details  Name: Jeffery West MRN: [id number] Date of Birth: 02/13/1956 Referring Provider:     Cardiac Rehab from 01/03/2020 in [facility] Cardiac and Pulmonary Rehab  Referring Provider [name], MD      Encounter Date: 03/17/2020  Check In:  Session Check In - 03/17/20 1601      Check-In   Supervising physician immediately available to respond to emergencies See telemetry face sheet for immediately available ER MD    Location [facility]-Cardiac & Pulmonary Rehab    Staff Present Renita Papa, RN Margurite Auerbach, MS Exercise Physiologist;Kelly Amedeo Plenty, BS, ACSM CEP, Exercise Physiologist    Virtual Visit No    Medication changes reported     No    Fall or balance concerns reported    No    Warm-up and Cool-down Performed on first and last piece of equipment    Resistance Training Performed Yes    VAD Patient? No    PAD/SET Patient? No      Pain Assessment   Currently in Pain? No/denies              Social History   Tobacco Use  Smoking Status Never Smoker  Smokeless Tobacco Never Used    Goals Met:  Independence with exercise equipment Exercise tolerated well No report of cardiac concerns or symptoms Strength training completed today  Goals Unmet:  Not Applicable  Comments: Pt able to follow exercise prescription today without complaint.  Will continue to monitor for progression.    Dr. Emily Filbert is Medical Director for [place] and [facility].

## 2020-03-17 NOTE — Addendum Note (Signed)
Addended by: Julieta Bellini on: 03/17/2020 02:19 PM   Modules accepted: Orders

## 2020-03-17 NOTE — Telephone Encounter (Signed)
Rx sent to pharmacy   

## 2020-03-19 ENCOUNTER — Encounter: Payer: No Typology Code available for payment source | Admitting: *Deleted

## 2020-03-19 ENCOUNTER — Other Ambulatory Visit: Payer: Self-pay

## 2020-03-19 DIAGNOSIS — Z951 Presence of aortocoronary bypass graft: Secondary | ICD-10-CM

## 2020-03-19 NOTE — Progress Notes (Signed)
Daily Session Note  Patient Details  Name: Jeffery West MRN: [id number] Date of Birth: 03/10/1956 Referring Provider:     Cardiac Rehab from 01/03/2020 in [facility] Cardiac and Pul[facility] Rehab  Referring Provider [name], MD      Encounter Date: 03/19/2020  Check In:  Session Check In - 03/19/20 1611      Check-In   Supervising physician immediately available to respond to emergencies See telemetry face sheet for immediately available ER MD    Location [facility]-Cardiac & Pulmonary Rehab    Staff Present Renita Papa, RN Margurite Auerbach, MS Exercise Physiologist;Amanda Oletta Darter, BA, ACSM CEP, Exercise Physiologist;Joseph Tessie Fass RCP,RRT,BSRT    Virtual Visit No    Medication changes reported     No    Fall or balance concerns reported    No    Warm-up and Cool-down Performed on first and last piece of equipment    Resistance Training Performed Yes    VAD Patient? No    PAD/SET Patient? No      Pain Assessment   Currently in Pain? No/denies              Social History   Tobacco Use  Smoking Status Never Smoker  Smokeless Tobacco Never Used    Goals Met:  Independence with exercise equipment Exercise tolerated well No report of cardiac concerns or symptoms Strength training completed today  Goals Unmet:  Not Applicable  Comments: Pt able to follow exercise prescription today without complaint.  Will continue to monitor for progression.    Dr. Emily Filbert is Medical Director for [facility].

## 2020-03-26 ENCOUNTER — Encounter: Payer: Self-pay | Admitting: *Deleted

## 2020-03-26 ENCOUNTER — Other Ambulatory Visit: Payer: Self-pay

## 2020-03-26 ENCOUNTER — Encounter: Payer: No Typology Code available for payment source | Attending: Cardiovascular Disease | Admitting: *Deleted

## 2020-03-26 DIAGNOSIS — Z951 Presence of aortocoronary bypass graft: Secondary | ICD-10-CM | POA: Insufficient documentation

## 2020-03-26 NOTE — Progress Notes (Signed)
Daily Session Note  Patient Details  Name: Jeffery West MRN: [id number] Date of Birth: 1956/04/24 Referring Provider:     Cardiac Rehab from 01/03/2020 in [facility] Cardiac and Pulmonary Rehab  Referring Provider [name], MD      Encounter Date: 03/26/2020  Check In:  Session Check In - 03/26/20 1611      Check-In   Supervising physician immediately available to respond to emergencies See telemetry face sheet for immediately available ER MD    Location [facility]-Cardiac & Pulmonary Rehab    Staff Present Heath Lark, RN, BSN, CCRP;Meredith Sherryll Burger, RN Margurite Auerbach, MS Exercise Physiologist;Joseph Tessie Fass RCP,RRT,BSRT    Virtual Visit No    Medication changes reported     No    Fall or balance concerns reported    No    Warm-up and Cool-down Performed on first and last piece of equipment    Resistance Training Performed Yes    VAD Patient? No    PAD/SET Patient? No      Pain Assessment   Currently in Pain? No/denies              Social History   Tobacco Use  Smoking Status Never Smoker  Smokeless Tobacco Never Used    Goals Met:  Independence with exercise equipment Exercise tolerated well No report of cardiac concerns or symptoms Strength training completed today  Goals Unmet:  Not Applicable  Comments: Pt able to follow exercise prescription today without complaint.  Will continue to monitor for progression.    Dr. Emily Filbert is Medical Director for [facility].

## 2020-03-26 NOTE — Progress Notes (Signed)
Cardiac Individual Treatment Plan  Patient Details  Name: SELDEN NOTEBOOM MRN: [id number] Date of Birth: [birth year] Referring Provider:     Cardiac Rehab from 01/03/2020 in [facility] Cardiac and Pulmonary Rehab  Referring Provider [name], MD      Initial Encounter Date:    Cardiac Rehab from 01/03/2020 in [facility] Cardiac and Pulmonary Rehab  Date 01/03/20      Visit Diagnosis: S/P CABG x 2  Patient's Home Medications on Admission:  Current Outpatient Medications:  .  acetaminophen (TYLENOL) 500 MG tablet, Take 1,000 mg by mouth every 6 (six) hours as needed for moderate pain or headache., Disp: , Rfl:  .  albuterol (PROAIR HFA) 108 (90 Base) MCG/ACT inhaler, Inhale 1 puff into the lungs every 6 (six) hours as needed for wheezing or shortness of breath., Disp: 18 g, Rfl: 3 .  allopurinol (ZYLOPRIM) 300 MG tablet, Take 1 tablet (300 mg total) by mouth daily., Disp: 90 tablet, Rfl: 3 .  aspirin EC 325 MG EC tablet, Take 1 tablet (325 mg total) by mouth daily., Disp: , Rfl:  .  BD PEN NEEDLE NANO 2ND GEN 32G X 4 MM MISC, USE AS DIRECTED, Disp: 100 each, Rfl: 8 .  Cholecalciferol (VITAMIN D) 2000 units CAPS, Take 2,000 Units by mouth every evening. , Disp: , Rfl:  .  Dulaglutide (TRULICITY) 0.27 OZ/3.6UY SOPN, Inject 0.75 mg into the skin once a week., Disp: 0.5 mL, Rfl: 12 .  ezetimibe (ZETIA) 10 MG tablet, TAKE 1 TABLET BY MOUTH EVERY DAY, Disp: 90 tablet, Rfl: 3 .  glucose blood (ONETOUCH VERIO) test strip, Use as instructed; BID, Disp: 100 each, Rfl: 12 .  insulin aspart (NOVOLOG) 100 UNIT/ML injection, Inject 6 Units into the skin every evening., Disp: 10 mL, Rfl: 1 .  insulin degludec (TRESIBA FLEXTOUCH) 100 UNIT/ML FlexTouch Pen, Take 0.18m/ 0.5 ML Subcutaneously every Tuesday., Disp: 15 mL, Rfl: 5 .  insulin detemir (LEVEMIR) 100 UNIT/ML injection, Inject 0.5 mLs (50 Units total) into the skin daily., Disp: 10 mL, Rfl: 3 .  Insulin Syringe-Needle U-100 30G X 1/2" 1 ML MISC, Use with  insulin 3 times daily, Disp: 270 each, Rfl: 3 .  losartan (COZAAR) 50 MG tablet, Take 50 mg by mouth daily., Disp: , Rfl:  .  magnesium oxide (MAG-OX) 400 MG tablet, Take 400 mg by mouth 2 (two) times daily. , Disp: , Rfl:  .  metFORMIN (GLUCOPHAGE) 1000 MG tablet, Take 1 tablet (1,000 mg total) by mouth 2 (two) times daily with a meal., Disp: 180 tablet, Rfl: 0 .  metoprolol tartrate (LOPRESSOR) 25 MG tablet, Take 1 tablet (25 mg total) by mouth 2 (two) times daily., Disp: 180 tablet, Rfl: 3 .  nystatin cream (MYCOSTATIN), Apply 1 application topically 2 (two) times daily., Disp: 30 g, Rfl: 6 .  OneTouch Delica Lancets 340HMISC, Use 3 times a day, Disp: 100 each, Rfl: 12 .  rosuvastatin (CRESTOR) 5 MG tablet, Take 1 tablet (5 mg total) by mouth daily., Disp: 90 tablet, Rfl: 3 .  tamsulosin (FLOMAX) 0.4 MG CAPS capsule, TAKE 1 CAPSULE BY MOUTH EVERY DAY, Disp: 90 capsule, Rfl: 3  Past Medical History: Past Medical History:  Diagnosis Date  . Coronary artery disease   . Diabetes mellitus without complication ([place]   . Erectile dysfunction   . Gout   . Heart murmur   . Hyperlipidemia   . Hypertension   . Obesity   . Osteoarthritis   . Vitamin D  deficiency     Tobacco Use: Social History   Tobacco Use  Smoking Status Never Smoker  Smokeless Tobacco Never Used    Labs: Recent Review Flowsheet Data    Labs for ITP Cardiac and Pulmonary Rehab Latest Ref Rng & Units 11/26/2019 11/27/2019 11/27/2019 12/18/2019 02/26/2020   Cholestrol 100 - 199 mg/dL - - - 108 -   LDLCALC 0 - 99 mg/dL - - - 53 -   HDL >39 mg/dL - - - 35(L) -   Trlycerides 0 - 149 mg/dL - - - 110 -   Hemoglobin A1c 4.0 - 5.6 % - - - - 8.2(A)   PHART 7.35 - 7.45 7.309(L) 7.293(L) 7.289(L) - -   PCO2ART 32 - 48 mmHg 50.7(H) 49.7(H) 54.8(H) - -   HCO3 20.0 - 28.0 mmol/L 25.5 24.1 26.2 - -   TCO2 22 - 32 mmol/L _0 - -   ACIDBASEDEF 0.0 - 2.0 mmol/L 1.0 3.0(H) 1.0 - -   O2SAT % 99.0 98.0 97.0 - -       Exercise  Target Goals: Exercise Program Goal: Individual exercise prescription set using results from initial 6 min walk test and THRR while considering  patient's activity barriers and safety.   Exercise Prescription Goal: Initial exercise prescription builds to 30-45 minutes a day of aerobic activity, 2-3 days per week.  Home exercise guidelines will be given to patient during program as part of exercise prescription that the participant will acknowledge.   Education: Aerobic Exercise & Resistance Training: - Gives group verbal and written instruction on the various components of exercise. Focuses on aerobic and resistive training programs and the benefits of this training and how to safely progress through these programs..   Cardiac Rehab from 03/05/2020 in [facility] Cardiac and Pulmonary Rehab  Date 02/07/20  Educator [facility]  Instruction Review Code 1- Verbalizes Understanding      Education: Exercise & Equipment Safety: - Individual verbal instruction and demonstration of equipment use and safety with use of the equipment.   Cardiac Rehab from 03/05/2020 in [facility] Cardiac and Pulmonary Rehab  Date 12/31/19  Educator [facility]  Instruction Review Code 1- Verbalizes Understanding      Education: Exercise Physiology & General Exercise Guidelines: - Group verbal and written instruction with models to review the exercise physiology of the cardiovascular system and associated critical values. Provides general exercise guidelines with specific guidelines to those with heart or lung disease.    Education: Flexibility, Balance, Mind/Body Relaxation: Provides group verbal/written instruction on the benefits of flexibility and balance training, including mind/body exercise modes such as yoga, pilates and tai chi.  Demonstration and skill practice provided.   Cardiac Rehab from 03/05/2020 in [facility] Cardiac and Pulmonary Rehab  Date 02/21/20  Educator [facility]  Instruction Review Code 1- Verbalizes Understanding       Activity Barriers & Risk Stratification:   6 Minute Walk:  6 Minute Walk    Row Name 01/03/20 1315         6 Minute Walk   Phase Initial     Distance 1175 feet     Walk Time 6 minutes     # of Rest Breaks 0     MPH 2.22     METS 2.69     RPE 13     Perceived Dyspnea  0     VO2 Peak 9.42     Symptoms No     Resting HR 67 bpm     Resting  BP 128/66     Resting Oxygen Saturation  96 %     Exercise Oxygen Saturation  during 6 min walk 95 %     Max Ex. HR 91 bpm     Max Ex. BP 150/68     2 Minute Post BP 122/64            Oxygen Initial Assessment:   Oxygen Re-Evaluation:   Oxygen Discharge (Final Oxygen Re-Evaluation):   Initial Exercise Prescription:  Initial Exercise Prescription - 01/03/20 1300      Date of Initial Exercise RX and Referring Provider   Date 01/03/20    Referring Provider [name], MD      Treadmill   MPH 1.9    Grade 0.5    Minutes 15    METs 2.59      REL-XR   Level 2    Speed 50    Minutes 15    METs 2.6      T5 Nustep   Level 2    SPM 80    Minutes 15    METs 2.6      Prescription Details   Frequency (times per week) 2    Duration Progress to 30 minutes of continuous aerobic without signs/symptoms of physical distress      Intensity   THRR 40-80% of Max Heartrate 103-139    Ratings of Perceived Exertion 11-13    Perceived Dyspnea 0-4      Progression   Progression Continue to progress workloads to maintain intensity without signs/symptoms of physical distress.      Resistance Training   Training Prescription Yes    Weight 3 lb    Reps 10-15           Perform Capillary Blood Glucose checks as needed.  Exercise Prescription Changes:  Exercise Prescription Changes    Row Name 01/03/20 1300 01/21/20 1700 02/05/20 0900 02/18/20 1300 03/04/20 0800     Response to Exercise   Blood Pressure (Admit) 128/66 142/72 134/62 130/78 116/68   Blood Pressure (Exercise) 150/68 144/68 124/60 140/60 138/66    Blood Pressure (Exit) 122/64 120/64 130/60 118/66 128/74   Heart Rate (Admit) 67 bpm 71 bpm 70 bpm 78 bpm 82 bpm   Heart Rate (Exercise) 91 bpm 91 bpm 81 bpm 89 bpm 98 bpm   Heart Rate (Exit) 67 bpm 63 bpm 59 bpm 66 bpm 83 bpm   Oxygen Saturation (Admit) 96 % -- -- -- --   Oxygen Saturation (Exercise) 95 % -- -- -- --   Oxygen Saturation (Exit) 97 % -- -- -- --   Rating of Perceived Exertion (Exercise) _0 Perceived Dyspnea (Exercise) 0 -- -- -- --   Symptoms _1    Comments Walk Test Results -- -- -- --   Duration -- Continue with 30 min of aerobic exercise without signs/symptoms of physical distress. Continue with 30 min of aerobic exercise without signs/symptoms of physical distress. Continue with 30 min of aerobic exercise without signs/symptoms of physical distress. Continue with 30 min of aerobic exercise without signs/symptoms of physical distress.   Intensity -- THRR unchanged THRR unchanged THRR unchanged THRR unchanged     Progression   Progression -- Continue to progress workloads to maintain intensity without signs/symptoms of physical distress. Continue to progress workloads to maintain intensity without signs/symptoms of physical distress. Continue to progress workloads to maintain intensity without signs/symptoms of physical distress. Continue to progress  workloads to maintain intensity without signs/symptoms of physical distress.   Average METs -- 2.5 2.53 2.5 2.32     Resistance Training   Training Prescription -- Yes Yes Yes Yes   Weight -- 3 lb 3 lb 4 lb 4 lb   Reps -- 10-15 10-15 10-15 10-15     Interval Training   Interval Training _0      Treadmill   MPH -- 1.9 1.9 1.8 2.2   Grade -- 0.5 0.5 0.5 1   Minutes -- _1 METs -- 2.6 2.59 2.59 2.99     NuStep   Level -- -- -- -- 1   Minutes -- -- -- -- 15   METs -- -- -- -- 2.5     Recumbant Elliptical   Level -- -- -- -- 1.5   Minutes -- -- -- -- 15   METs --  -- -- -- 1.8     REL-XR   Level -- -- 2 -- --   Minutes -- -- 15 -- --   METs -- -- 2.2 -- --     T5 Nustep   Level -- _2 --   SPM -- 80 -- 80 --   Minutes -- _3 --   METs -- 2.5 2.8 2.4 --     Biostep-RELP   Level -- -- -- -- 2   Minutes -- -- -- -- 15   METs -- -- -- -- 2     Home Exercise Plan   Plans to continue exercise at -- -- Home (comment)  walking and staff videos Home (comment)  walking and staff videos Home (comment)  walking and staff videos   Frequency -- -- Add 2 additional days to program exercise sessions. Add 2 additional days to program exercise sessions. Add 2 additional days to program exercise sessions.   Initial Home Exercises Provided -- -- 02/05/20 02/05/20 02/05/20   Row Name 03/17/20 1200             Response to Exercise   Blood Pressure (Admit) 130/72       Blood Pressure (Exercise) 144/76       Blood Pressure (Exit) 118/58       Heart Rate (Admit) 99 bpm       Heart Rate (Exercise) 102 bpm       Heart Rate (Exit) 88 bpm       Rating of Perceived Exertion (Exercise) 12       Symptoms none       Duration Continue with 30 min of aerobic exercise without signs/symptoms of physical distress.       Intensity THRR unchanged         Progression   Progression Continue to progress workloads to maintain intensity without signs/symptoms of physical distress.       Average METs 3.3         Resistance Training   Training Prescription Yes       Weight 4 lb       Reps 10-15         Interval Training   Interval Training No         Treadmill   MPH 2.2       Grade 1       Minutes 15       METs 2.99         NuStep   Level 3  SPM 80       Minutes 15       METs 3.6         Home Exercise Plan   Plans to continue exercise at Home (comment)  walking and staff videos       Frequency Add 2 additional days to program exercise sessions.       Initial Home Exercises Provided 02/05/20              Exercise Comments:   Exercise  Goals and Review:  Exercise Goals    Row Name 01/03/20 1044             Exercise Goals   Increase Physical Activity Yes       Intervention Provide advice, education, support and counseling about physical activity/exercise needs.;Develop an individualized exercise prescription for aerobic and resistive training based on initial evaluation findings, risk stratification, comorbidities and participant's personal goals.       Expected Outcomes Short Term: Attend rehab on a regular basis to increase amount of physical activity.;Long Term: Add in home exercise to make exercise part of routine and to increase amount of physical activity.;Long Term: Exercising regularly at least 3-5 days a week.       Increase Strength and Stamina Yes       Intervention Provide advice, education, support and counseling about physical activity/exercise needs.;Develop an individualized exercise prescription for aerobic and resistive training based on initial evaluation findings, risk stratification, comorbidities and participant's personal goals.       Expected Outcomes Short Term: Increase workloads from initial exercise prescription for resistance, speed, and METs.;Short Term: Perform resistance training exercises routinely during rehab and add in resistance training at home;Long Term: Improve cardiorespiratory fitness, muscular endurance and strength as measured by increased METs and functional capacity (6MWT)       Able to understand and use rate of perceived exertion (RPE) scale Yes       Intervention Provide education and explanation on how to use RPE scale       Expected Outcomes Short Term: Able to use RPE daily in rehab to express subjective intensity level;Long Term:  Able to use RPE to guide intensity level when exercising independently       Able to understand and use Dyspnea scale Yes       Intervention Provide education and explanation on how to use Dyspnea scale       Expected Outcomes Short Term: Able to use  Dyspnea scale daily in rehab to express subjective sense of shortness of breath during exertion;Long Term: Able to use Dyspnea scale to guide intensity level when exercising independently       Knowledge and understanding of Target Heart Rate Range (THRR) Yes       Intervention Provide education and explanation of THRR including how the numbers were predicted and where they are located for reference       Expected Outcomes Short Term: Able to state/look up THRR;Short Term: Able to use daily as guideline for intensity in rehab;Long Term: Able to use THRR to govern intensity when exercising independently       Able to check pulse independently Yes       Intervention Provide education and demonstration on how to check pulse in carotid and radial arteries.;Review the importance of being able to check your own pulse for safety during independent exercise       Expected Outcomes Short Term: Able to explain why pulse checking is important during independent  exercise;Long Term: Able to check pulse independently and accurately       Understanding of Exercise Prescription Yes       Intervention Provide education, explanation, and written materials on patient's individual exercise prescription       Expected Outcomes Short Term: Able to explain program exercise prescription;Long Term: Able to explain home exercise prescription to exercise independently              Exercise Goals Re-Evaluation :  Exercise Goals Re-Evaluation    Row Name 01/21/20 1750 02/05/20 0819 02/18/20 1315 03/04/20 0828 03/17/20 1232     Exercise Goal Re-Evaluation   Exercise Goals Review Increase Physical Activity;Increase Strength and Stamina;Understanding of Exercise Prescription Increase Physical Activity;Increase Strength and Stamina;Understanding of Exercise Prescription Increase Physical Activity;Increase Strength and Stamina;Understanding of Exercise Prescription Increase Physical Activity;Increase Strength and  Stamina;Understanding of Exercise Prescription Increase Physical Activity;Increase Strength and Stamina;Understanding of Exercise Prescription   Comments Jeani Hawking is tolerating exercise well in his first two weeks of exercise. Staff will monitor progress. Jeani Hawking is doing well in rehab.  He has already started to notice that things are getting easier. Reviewed home exercise with pt today.  Pt plans to walk and use staff videos at home for exercise.  Reviewed THR, pulse, RPE, sign and symptoms, pulse oximetery and when to call 911 or MD.  Also discussed weather considerations and indoor options.  Pt voiced understanding. Jeani Hawking attends consistently.  He works at [employer] quite reach THR range.  Staff will encourage him to increase speed or grade on TM and resistance on machines.  he has increased to 4 lb or strength work. Jeani Hawking has changed his schedule due to work and is now coming in the afternoons.  We will need to increase his levels on his new equipment.  We will continue to monitor his progress. --   Expected Outcomes Short:  continue to attend consistently Long:  build strength and stamina Short: Start to add in more walking at home again  Long: Continue to build stamina. Short: increase levels to rach THR range Long: increase stamina Short: Increase NS and BS (new station) Long: Continue to improve stamina. --   Row Name 03/17/20 1233 03/17/20 1712           Exercise Goal Re-Evaluation   Exercise Goals Review Increase Physical Activity;Increase Strength and Stamina;Understanding of Exercise Prescription Increase Physical Activity;Increase Strength and Stamina;Understanding of Exercise Prescription      Comments Jeani Hawking has increased levels on T4 and T5 to level 3.  He has increased to 4 lb weights.  Staff will monitor progress. Jeani Hawking has been doing well in rehab. He stays active outside by doing yardwork. Sometimes walks with his wife for 20 minute increments. Encouraged to maintain the planned and  continuous exercise for at least 30 minutes. He does take his pulse sometimes but not consistent with it. Does not use RPE at home, talked about importance of taking HR, using RPE, and maintaining THR level. Jeani Hawking is thinking of going to the [employer] after he graduates from [employer].      Expected Outcomes Short: continue to attend consistently Long: increase overall MET level Short: Use RPE and THR at home. Look into [employer] details and graduate Long: Able to exercise independently at home             Discharge Exercise Prescription (Final Exercise Prescription Changes):  Exercise Prescription Changes - 03/17/20 1200      Response to Exercise  Blood Pressure (Admit) 130/72    Blood Pressure (Exercise) 144/76    Blood Pressure (Exit) 118/58    Heart Rate (Admit) 99 bpm    Heart Rate (Exercise) 102 bpm    Heart Rate (Exit) 88 bpm    Rating of Perceived Exertion (Exercise) 12    Symptoms none    Duration Continue with 30 min of aerobic exercise without signs/symptoms of physical distress.    Intensity THRR unchanged      Progression   Progression Continue to progress workloads to maintain intensity without signs/symptoms of physical distress.    Average METs 3.3      Resistance Training   Training Prescription Yes    Weight 4 lb    Reps 10-15      Interval Training   Interval Training No      Treadmill   MPH 2.2    Grade 1    Minutes 15    METs 2.99      NuStep   Level 3    SPM 80    Minutes 15    METs 3.6      Home Exercise Plan   Plans to continue exercise at Home (comment)   walking and staff videos   Frequency Add 2 additional days to program exercise sessions.    Initial Home Exercises Provided 02/05/20           Nutrition:  Target Goals: Understanding of nutrition guidelines, daily intake of sodium <1575m, cholesterol <2061m calories 30% from fat and 7% or less from saturated fats, daily to have 5 or more servings of fruits and  vegetables.  Education: Controlling Sodium/Reading Food Labels -Group verbal and written material supporting the discussion of sodium use in heart healthy nutrition. Review and explanation with models, verbal and written materials for utilization of the food label.   Education: General Nutrition Guidelines/Fats and Fiber: -Group instruction provided by verbal, written material, models and posters to present the general guidelines for heart healthy nutrition. Gives an explanation and review of dietary fats and fiber.   Biometrics:  Pre Biometrics - 01/03/20 1027      Pre Biometrics   Height 5' 10.75" (1.797 m)    Weight 253 lb 11.2 oz (115.1 kg)    BMI (Calculated) 35.64    Single Leg Stand 2.44 seconds            Nutrition Therapy Plan and Nutrition Goals:  Nutrition Therapy & Goals - 01/22/20 0755      Nutrition Therapy   Diet Heart healthy, low Na, T2DM    Drug/Food Interactions Food/Disease   Insulin - Levemir and Novolog   Protein (specify units) 95-100g    Fiber 30 grams    Whole Grain Foods 3 servings    Saturated Fats 12 max. grams    Fruits and Vegetables 5 servings/day    Sodium 1.5 grams      Personal Nutrition Goals   Nutrition Goal ST: utilize MyPlate to include more whole grains/ starchy vegetables he enjoys, use spreadable margarine vs stick margarine, continue with current chnages LT: continue with heart healthy changes and lower BG    Comments CHO 60g/meal - 200g/day. Trying to avoid starchy vegetables and grains. B: 2 pieces of whole wheat toast with yogurt L: tuna cottage cheese and avocado and salads or leftover D: chicken and vegetables (heart healthy cooking methods) - margarine (stick) - small amount. BG: ~150-170. Levemir and novolog, recently lowered insulin. One at dinner, not at breakfast  and lunch. Discussed heart healthy eating and T2DM friendly eating - discussed whole grains and MyPlate      Intervention Plan   Intervention Prescribe, educate  and counsel regarding individualized specific dietary modifications aiming towards targeted core components such as weight, hypertension, lipid management, diabetes, heart failure and other comorbidities.;Nutrition handout(s) given to patient.    Expected Outcomes Short Term Goal: Understand basic principles of dietary content, such as calories, fat, sodium, cholesterol and nutrients.;Short Term Goal: A plan has been developed with personal nutrition goals set during dietitian appointment.;Long Term Goal: Adherence to prescribed nutrition plan.           Nutrition Assessments:  Nutrition Assessments - 01/03/20 1039      MEDFICTS Scores   Pre Score 39           MEDIFICTS Score Key:          ?70 Need to make dietary changes          40-70 Heart Healthy Diet         ? 40 Therapeutic Level Cholesterol Diet  Nutrition Goals Re-Evaluation:  Nutrition Goals Re-Evaluation    [place] Name 02/26/20 0903 03/12/20 1615           Goals   Nutrition Goal ST: utilize MyPlate to include more whole grains/ starchy vegetables he enjoys, use spreadable margarine vs stick margarine, continue with current chnages LT: continue with heart healthy changes and lower BG ST: ST: continue with current changes LT: continue with heart healthy changes and lower BG      Comment Continue with current changes Uses spreadable margarine on whole wheat toast. Pt reports doing well with his nutriton, he reports his BG has been lower ~135-140, medication decreased. Encouraged pt that he is making progress.      Expected Outcome ST: utilize MyPlate to include more whole grains/ starchy vegetables he enjoys, use spreadable margarine vs stick margarine, continue with current chnages LT: continue with heart healthy changes and lower BG ST: ST: continue with current changes LT: continue with heart healthy changes and lower BG             Nutrition Goals Discharge (Final Nutrition Goals Re-Evaluation):  Nutrition Goals  Re-Evaluation - 03/12/20 1615      Goals   Nutrition Goal ST: ST: continue with current changes LT: continue with heart healthy changes and lower BG    Comment Uses spreadable margarine on whole wheat toast. Pt reports doing well with his nutriton, he reports his BG has been lower ~135-140, medication decreased. Encouraged pt that he is making progress.    Expected Outcome ST: ST: continue with current changes LT: continue with heart healthy changes and lower BG           Psychosocial: Target Goals: Acknowledge presence or absence of significant depression and/or stress, maximize coping skills, provide positive support system. Participant is able to verbalize types and ability to use techniques and skills needed for reducing stress and depression.   Education: Depression - Provides group verbal and written instruction on the correlation between heart/lung disease and depressed mood, treatment options, and the stigmas associated with seeking treatment.   Education: Sleep Hygiene -Provides group verbal and written instruction about how sleep can affect your health.  Define sleep hygiene, discuss sleep cycles and impact of sleep habits. Review good sleep hygiene tips.     Education: Stress and Anxiety: - Provides group verbal and written instruction about the health risks of elevated stress and  causes of high stress.  Discuss the correlation between heart/lung disease and anxiety and treatment options. Review healthy ways to manage with stress and anxiety.    Initial Review & Psychosocial Screening:  Initial Psych Review & Screening - 12/31/19 1434      Initial Review   Current issues with None Identified      Family Dynamics   Good Support System? Yes    Comments He can look to his wife and son.      Barriers   Psychosocial barriers to participate in program The patient should benefit from training in stress management and relaxation.;There are no identifiable barriers or  psychosocial needs.      Screening Interventions   Interventions Encouraged to exercise    Expected Outcomes Short Term goal: Utilizing psychosocial counselor, staff and physician to assist with identification of specific Stressors or current issues interfering with healing process. Setting desired goal for each stressor or current issue identified.;Long Term Goal: Stressors or current issues are controlled or eliminated.;Short Term goal: Identification and review with participant of any Quality of Life or Depression concerns found by scoring the questionnaire.;Long Term goal: The participant improves quality of Life and PHQ9 Scores as seen by post scores and/or verbalization of changes           Quality of Life Scores:   Quality of Life - 01/03/20 1043      Quality of Life   Select Quality of Life      Quality of Life Scores   Health/Function Pre 21.27 %    Socioeconomic Pre 19.36 %    Psych/Spiritual Pre 16.79 %    Family Pre 24.9 %    GLOBAL Pre 20.49 %          Scores of 19 and below usually indicate a poorer quality of life in these areas.  A difference of  2-3 points is a clinically meaningful difference.  A difference of 2-3 points in the total score of the Quality of Life Index has been associated with significant improvement in overall quality of life, self-image, physical symptoms, and general health in studies assessing change in quality of life.  PHQ-9: Recent Review Flowsheet Data    Depression screen [facility] 2/9 02/05/2020 01/03/2020 07/16/2019 06/28/2017 02/14/2017   Decreased Interest 0 1 0 0 0   Down, Depressed, Hopeless 0 1 0 0 0   PHQ - 2 Score 0 2 0 0 0   Altered sleeping 1 1 0 0 -   Tired, decreased energy 1 1 0 0 -   Change in appetite 1  0 0 0 -   Feeling bad or failure about yourself  0 0 0 0 -   Trouble concentrating 0 1 0 0 -   Moving slowly or fidgety/restless 0 0 0 0 -   Suicidal thoughts 0 0 0 0 -   PHQ-9 Score 3 5 0 0 -   Difficult doing work/chores Not  difficult at all Not difficult at all Not difficult at all Not difficult at all -     Interpretation of Total Score  Total Score Depression Severity:  1-4 = Minimal depression, 5-9 = Mild depression, 10-14 = Moderate depression, 15-19 = Moderately severe depression, 20-27 = Severe depression   Psychosocial Evaluation and Intervention:  Psychosocial Evaluation - 03/17/20 1726      Psychosocial Evaluation & Interventions   Interventions Encouraged to exercise with the program and follow exercise prescription    Comments Jeani Hawking has a  great support system from his wife and grandchildren. Last time he completed his PHQ went down. He is holding up well mentally. He states he has always had an issue with sleeping due to worry about finance, health, family, etc. He is not interested in trying new medications or talking to his doctor at this time. He states he will if things do not get better. He hopes to use exercise as stress management and increase his fatigue at night to make him sleep better. Jeani Hawking will be getting close to finishing up [place] soon.    Expected Outcomes Short: Continue coming to rehab regularly and graduate Long: Use exercise as stress management resource    Continue Psychosocial Services  Follow up required by staff      Discharge Psychosocial Assessment & Intervention   Comments Jeani Hawking has been doing really well in the program and he enjoys coming to to rehab.  He enjoys the challenge and thinks he "feels better" after he finished his exercise sessions.           Psychosocial Re-Evaluation:  Psychosocial Re-Evaluation    [place] Name 02/05/20 0809 03/17/20 1627 03/17/20 1723         Psychosocial Re-Evaluation   Current issues with Current Sleep Concerns;Current Stress Concerns Current Sleep Concerns;Current Stress Concerns Current Sleep Concerns;Current Stress Concerns     Comments Jeani Hawking is doing well in rehab.  His PHQ score has gone down.  He admitted to cheating on his diet  when the grandkids visit.  He enjoys them, but they wreck his diet.  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  He needs to be tired to get to sleep.  He also will wake up early some days.  He has never really tried anything to help with sleep and no interest in trying. He continues to worry about his mom in her 74s living by herself and recently had flooding from her dishwasher. -- Jeani Hawking has a great support system from his wife and grandchildren. Last time he completed his PHQ went down. He is holding up well mentally. He states he has always had an issue with sleeping due to worry about finance, health, family, etc. He is not interested in trying new medications or talking to his doctor at this time. Hopes to use exercise as stress management and increase his fatigue at night.     Expected Outcomes Short: Enjoy grandkids  Long: Continue to focus on positive. -- Short: Focus on maintain better sleep patterns Long: Continue to maintain positive attitude/use exercise for stress management     Interventions Encouraged to attend Cardiac Rehabilitation for the exercise -- Encouraged to attend Cardiac Rehabilitation for the exercise     Continue Psychosocial Services  Follow up required by staff -- Follow up required by staff            Psychosocial Discharge (Final Psychosocial Re-Evaluation):  Psychosocial Re-Evaluation - 03/17/20 1723      Psychosocial Re-Evaluation   Current issues with Current Sleep Concerns;Current Stress Concerns    Comments Jeani Hawking has a great support system from his wife and grandchildren. Last time he completed his PHQ went down. He is holding up well mentally. He states he has always had an issue with sleeping due to worry about finance, health, family, etc. He is not interested in trying new medications or talking to his doctor at this time. Hopes to use exercise as stress management and increase his  fatigue at night.    Expected Outcomes  Short: Focus on maintain better sleep patterns Long: Continue to maintain positive attitude/use exercise for stress management    Interventions Encouraged to attend Cardiac Rehabilitation for the exercise    Continue Psychosocial Services  Follow up required by staff           Vocational Rehabilitation: Provide vocational rehab assistance to qualifying candidates.   Vocational Rehab Evaluation & Intervention:   Education: Education Goals: Education classes will be provided on a variety of topics geared toward better understanding of heart health and risk factor modification. Participant will state understanding/return demonstration of topics presented as noted by education test scores.  Learning Barriers/Preferences:  Learning Barriers/Preferences - 12/31/19 1432      Learning Barriers/Preferences   Learning Barriers None    Learning Preferences None           General Cardiac Education Topics:  AED/CPR: - Group verbal and written instruction with the use of models to demonstrate the basic use of the AED with the basic ABC's of resuscitation.   Anatomy & Physiology of the Heart: - Group verbal and written instruction and models provide basic cardiac anatomy and physiology, with the coronary electrical and arterial systems. Review of Valvular disease and Heart Failure   Cardiac Rehab from 03/05/2020 in [facility] Cardiac and Pulmonary Rehab  Date 02/14/20  Educator [facility]  Instruction Review Code 1- Verbalizes Understanding      Cardiac Procedures: - Group verbal and written instruction to review commonly prescribed medications for heart disease. Reviews the medication, class of the drug, and side effects. Includes the steps to properly store meds and maintain the prescription regimen. (beta blockers and nitrates)   Cardiac Rehab from 03/05/2020 in [facility] Cardiac and Pulmonary Rehab  Date 02/14/20  Educator [facility]  Instruction Review Code 1- Verbalizes Understanding      Cardiac  Medications I: - Group verbal and written instruction to review commonly prescribed medications for heart disease. Reviews the medication, class of the drug, and side effects. Includes the steps to properly store meds and maintain the prescription regimen.   Cardiac Rehab from 03/05/2020 in [facility] Cardiac and Pulmonary Rehab  Date 01/17/20  Educator SB  Instruction Review Code 1- Verbalizes Understanding      Cardiac Medications II: -Group verbal and written instruction to review commonly prescribed medications for heart disease. Reviews the medication, class of the drug, and side effects. (all other drug classes)   Cardiac Rehab from 03/05/2020 in [facility] Cardiac and Pulmonary Rehab  Date 01/31/20  Educator SB  Instruction Review Code 1- Verbalizes Understanding       Go Sex-Intimacy & Heart Disease, Get SMART - Goal Setting: - Group verbal and written instruction through game format to discuss heart disease and the return to sexual intimacy. Provides group verbal and written material to discuss and apply goal setting through the application of the S.M.A.R.T. Method.   Cardiac Rehab from 03/05/2020 in [facility] Cardiac and Pulmonary Rehab  Date 02/14/20  Educator [facility]  Instruction Review Code 1- Verbalizes Understanding      Other Matters of the Heart: - Provides group verbal, written materials and models to describe Stable Angina and Peripheral Artery. Includes description of the disease process and treatment options available to the cardiac patient.   Infection Prevention: - Provides verbal and written material to individual with discussion of infection control including proper hand washing and proper equipment cleaning during exercise session.   Cardiac Rehab from 03/05/2020 in  [facility] Cardiac and Pulmonary Rehab  Date 12/31/19  Educator [facility]  Instruction Review Code 1- Verbalizes Understanding      Falls Prevention: - Provides verbal and written material to individual with discussion of  falls prevention and safety.   Cardiac Rehab from 03/05/2020 in [facility] Cardiac and Pulmonary Rehab  Date 12/31/19  Educator [facility]  Instruction Review Code 1- Verbalizes Understanding      Other: -Provides group and verbal instruction on various topics (see comments)   Knowledge Questionnaire Score:  Knowledge Questionnaire Score - 01/03/20 1038      Knowledge Questionnaire Score   Pre Score 20/26: Heart Disease,Nutrition, Exercise           Core Components/Risk Factors/Patient Goals at Admission:  Personal Goals and Risk Factors at Admission - 01/03/20 1045      Core Components/Risk Factors/Patient Goals on Admission    Weight Management Yes;Obesity;Weight Loss    Intervention Weight Management: Provide education and appropriate resources to help participant work on and attain dietary goals.;Weight Management: Develop a combined nutrition and exercise program designed to reach desired caloric intake, while maintaining appropriate intake of nutrient and fiber, sodium and fats, and appropriate energy expenditure required for the weight goal.;Weight Management/Obesity: Establish reasonable short term and long term weight goals.;Obesity: Provide education and appropriate resources to help participant work on and attain dietary goals.    Admit Weight 253 lb 11.2 oz (115.1 kg)    Goal Weight: Short Term 248 lb (112.5 kg)    Goal Weight: Long Term 243 lb (110.2 kg)    Expected Outcomes Short Term: Continue to assess and modify interventions until short term weight is achieved;Long Term: Adherence to nutrition and physical activity/exercise program aimed toward attainment of established weight goal;Weight Maintenance: Understanding of the daily nutrition guidelines, which includes 25-35% calories from fat, 7% or less cal from saturated fats, less than 217m cholesterol, less than 1.5gm of sodium, & 5 or more servings of fruits and vegetables daily;Weight Loss: Understanding of general  recommendations for a balanced deficit meal plan, which promotes 1-2 lb weight loss per week and includes a negative energy balance of [phone] kcal/d;Understanding recommendations for meals to include 15-35% energy as protein, 25-35% energy from fat, 35-60% energy from carbohydrates, less than 2074mof dietary cholesterol, 20-35 gm of total fiber daily;Understanding of distribution of calorie intake throughout the day with the consumption of 4-5 meals/snacks    Diabetes Yes    Intervention Provide education about signs/symptoms and action to take for hypo/hyperglycemia.;Provide education about proper nutrition, including hydration, and aerobic/resistive exercise prescription along with prescribed medications to achieve blood glucose in normal ranges: Fasting glucose 65-99 mg/dL    Expected Outcomes Short Term: Participant verbalizes understanding of the signs/symptoms and immediate care of hyper/hypoglycemia, proper foot care and importance of medication, aerobic/resistive exercise and nutrition plan for blood glucose control.;Long Term: Attainment of HbA1C < 7%.    Hypertension Yes    Intervention Provide education on lifestyle modifcations including regular physical activity/exercise, weight management, moderate sodium restriction and increased consumption of fresh fruit, vegetables, and low fat dairy, alcohol moderation, and smoking cessation.;Monitor prescription use compliance.    Expected Outcomes Short Term: Continued assessment and intervention until BP is < 140/9059mG in hypertensive participants. < 130/10m23m in hypertensive participants with diabetes, heart failure or chronic kidney disease.;Long Term: Maintenance of blood pressure at goal levels.    Lipids Yes    Intervention Provide education and support for participant on nutrition & aerobic/resistive exercise along  with prescribed medications to achieve LDL <53m, HDL >4105m    Expected Outcomes Short Term: Participant states understanding  of desired cholesterol values and is compliant with medications prescribed. Participant is following exercise prescription and nutrition guidelines.;Long Term: Cholesterol controlled with medications as prescribed, with individualized exercise RX and with personalized nutrition plan. Value goals: LDL < 7016mHDL > 40 mg.           Education:Diabetes - Individual verbal and written instruction to review signs/symptoms of diabetes, desired ranges of glucose level fasting, after meals and with exercise. Acknowledge that pre and post exercise glucose checks will be done for 3 sessions at entry of program.   [facility]  Date 12/31/19  [facility] Code 1- Verbalizes Understanding      Education: Know Your Numbers and Risk Factors: -Group verbal and written instruction about important numbers in your health.  Discussion of what are risk factors and how they play a role in the disease process.  Review of Cholesterol, Blood Pressure, Diabetes, and BMI and the role they play in your overall health.   [facility]  Date 01/31/20  Educator SB  Instruction Review Code 1- Verbalizes Understanding      Core Components/Risk Factors/Patient Goals Review:   Goals and Risk Factor Review    Row Name 02/05/20 0810 03/17/20 1620           Core Components/Risk Factors/Patient Goals Review   Personal Goals Review Weight Management/Obesity;Hypertension;Lipids;Diabetes Weight Management/Obesity;Hypertension;Lipids;Diabetes      Review LynJeani Hawkings been doing well in rehab.  He admitted to cheating some on his diet when the grandkids visit as his wife buys all the good stuff and he has no will power.  He is trying to do the best he can and does follow some weight watchers ideas. His blood pressures have been good in class.  He does not check them at home as he does not have a cuff. His  blood sugars have done well in rehab, but a little off at home.  He has changed some of his meds and has noticed that they have been running higher in the 200s in the morning.  He is going to talk with doctor more about it and work on his diet. Lynn's weight has been more in the stable range- and admits he would like to lose another 10 lbs as one of his goals. He is attempting to eat healthier but states he will ocassionaly cave into desserts. He has met with the RD and established goals. Blood sugars at home have fluctuated- sometime higher in the mornings. He will reach out to his doctor if they continue to be high as his medications have been adjusted some. He checks his sugars 2x/day. BPs have been in good range here at rehab.      Expected Outcomes Short: Get sugars back under control  Long; Continue to work on risk factors. Short: Keep monitoring sugars Long: Manage lifestyle risk factors             Core Components/Risk Factors/Patient Goals at Discharge (Final Review):   Goals and Risk Factor Review - 03/17/20 1620      Core Components/Risk Factors/Patient Goals Review   Personal Goals Review Weight Management/Obesity;Hypertension;Lipids;Diabetes    Review Lynn's weight has been more in the stable range- and admits he would like to lose another 10 lbs as one  of his goals. He is attempting to eat healthier but states he will ocassionaly cave into desserts. He has met with the RD and established goals. Blood sugars at home have fluctuated- sometime higher in the mornings. He will reach out to his doctor if they continue to be high as his medications have been adjusted some. He checks his sugars 2x/day. BPs have been in good range here at rehab.    Expected Outcomes Short: Keep monitoring sugars Long: Manage lifestyle risk factors           ITP Comments:  ITP Comments    Row Name 12/31/19 1437 01/03/20 1011 01/30/20 0607 02/27/20 1519 03/26/20 0537   ITP Comments Virtual Visit completed.  Patient informed on EP and RD appointment and 6 Minute walk test. Patient also informed of patient health questionnaires on My Chart. Patient Verbalizes understanding. Visit diagnosis can be found in [facility] 11/26/2019. Completed 6MWT and gym orientation. Initial ITP created and sent for review to Dr. Emily Filbert, Medical Director. 30 Day review completed. Medical Director ITP review done, changes made as directed, and signed approval by Medical Director. 30 day review completed. ITP sent to Dr. Emily Filbert, Medical Director of Cardiac and Pulmonary Rehab. Continue with ITP unless changes are made by physician. 30 Day review completed. Medical Director ITP review done, changes made as directed, and signed approval by Medical Director.          Comments:

## 2020-03-31 ENCOUNTER — Other Ambulatory Visit: Payer: Self-pay

## 2020-03-31 ENCOUNTER — Encounter: Payer: No Typology Code available for payment source | Admitting: *Deleted

## 2020-03-31 DIAGNOSIS — Z951 Presence of aortocoronary bypass graft: Secondary | ICD-10-CM

## 2020-03-31 NOTE — Progress Notes (Signed)
3Daily Session Note  Patient Details  Name: Jeffery West MRN: [id number] Date of Birth: 04-25-1956 Referring Provider:     Cardiac Rehab from 01/03/2020 in [facility] Cardiac and Pulmonary Rehab  Referring Provider [name], MD      Encounter Date: 03/31/2020  Check In:  Session Check In - 03/31/20 1605      Check-In   Supervising physician immediately available to respond to emergencies See telemetry face sheet for immediately available ER MD    Location [facility]-Cardiac & Pulmonary Rehab    Staff Present Renita Papa, RN Margurite Auerbach, MS Exercise Physiologist;Kelly Amedeo Plenty, BS, ACSM CEP, Exercise Physiologist    Virtual Visit No    Medication changes reported     No    Fall or balance concerns reported    No    Warm-up and Cool-down Performed on first and last piece of equipment    Resistance Training Performed Yes    VAD Patient? No    PAD/SET Patient? No      Pain Assessment   Currently in Pain? No/denies              Social History   Tobacco Use  Smoking Status Never Smoker  Smokeless Tobacco Never Used    Goals Met:  Independence with exercise equipment Exercise tolerated well No report of cardiac concerns or symptoms Strength training completed today  Goals Unmet:  Not Applicable  Comments:  Pt able to follow exercise prescription today without complaint.  Will continue to monitor for progression.    Dr. Emily Filbert is Medical Director for [facility].

## 2020-04-01 ENCOUNTER — Encounter: Payer: Self-pay | Admitting: Family Medicine

## 2020-04-01 ENCOUNTER — Ambulatory Visit (INDEPENDENT_AMBULATORY_CARE_PROVIDER_SITE_OTHER): Payer: No Typology Code available for payment source | Admitting: Family Medicine

## 2020-04-01 VITALS — BP 117/69 | HR 64 | Temp 98.4°F | Resp 16 | Ht 70.0 in | Wt 257.0 lb

## 2020-04-01 DIAGNOSIS — E1165 Type 2 diabetes mellitus with hyperglycemia: Secondary | ICD-10-CM

## 2020-04-01 DIAGNOSIS — Z23 Encounter for immunization: Secondary | ICD-10-CM

## 2020-04-01 DIAGNOSIS — E782 Mixed hyperlipidemia: Secondary | ICD-10-CM

## 2020-04-01 DIAGNOSIS — I25810 Atherosclerosis of coronary artery bypass graft(s) without angina pectoris: Secondary | ICD-10-CM

## 2020-04-01 DIAGNOSIS — I10 Essential (primary) hypertension: Secondary | ICD-10-CM

## 2020-04-01 DIAGNOSIS — Z952 Presence of prosthetic heart valve: Secondary | ICD-10-CM

## 2020-04-01 NOTE — Progress Notes (Signed)
I,[name],acting as a scribe for [name], MD.,have documented all relevant documentation on the behalf of [name], MD,as directed by  [name], MD while in the presence of [name], MD.  Established patient visit   Patient: Jeffery West   DOB: [birth year]   64 y.o. Male  MRN: [id number] Visit Date: 04/01/2020  Today's healthcare provider: [name], MD   Chief Complaint  Patient presents with  . Diabetes  . Follow-up   Subjective    HPI  Patient is doing fairly well.  He is attempting to exercise and attempting to watch his diet. Type 2 diabetes mellitus with other specified complication, without long-term current use of insulin ([place]) From 02/26/2020-continue present regimen but patient really needs to work on exercise and dietary changes. To try to simplify things at this time will restart Trulicity and decrease Tresiba to 35 units daily.  We will go ahead and stop Levemir completely as he was alternating between Levemir and [place].  Finish the NovoLog and then stop as he is down to 1 meal per day.  Follow-up in 1 month with his blood sugar readings       Medications: Outpatient Medications Prior to Visit  Medication Sig  . acetaminophen (TYLENOL) 500 MG tablet Take 1,000 mg by mouth every 6 (six) hours as needed for moderate pain or headache.  . albuterol (PROAIR HFA) 108 (90 Base) MCG/ACT inhaler Inhale 1 puff into the lungs every 6 (six) hours as needed for wheezing or shortness of breath.  . allopurinol (ZYLOPRIM) 300 MG tablet Take 1 tablet (300 mg total) by mouth daily.  Marland Kitchen aspirin EC 325 MG EC tablet Take 1 tablet (325 mg total) by mouth daily.  . BD PEN NEEDLE NANO 2ND GEN 32G X 4 MM MISC USE AS DIRECTED  . Cholecalciferol (VITAMIN D) 2000 units CAPS Take 2,000 Units by mouth every evening.   . Dulaglutide (TRULICITY) 3.23 FT/7.3UK SOPN Inject 0.75 mg into the skin once a week.  . ezetimibe (ZETIA) 10 MG tablet  TAKE 1 TABLET BY MOUTH EVERY DAY  . glucose blood (ONETOUCH VERIO) test strip Use as instructed; BID  . insulin aspart (NOVOLOG) 100 UNIT/ML injection Inject 6 Units into the skin every evening.  . insulin degludec (TRESIBA FLEXTOUCH) 100 UNIT/ML FlexTouch Pen Take 0.75mg / 0.5 ML Subcutaneously every Tuesday.  . insulin detemir (LEVEMIR) 100 UNIT/ML injection Inject 0.5 mLs (50 Units total) into the skin daily.  . Insulin Syringe-Needle U-100 30G X 1/2" 1 ML MISC Use with insulin 3 times daily  . losartan (COZAAR) 50 MG tablet Take 50 mg by mouth daily.  . magnesium oxide (MAG-OX) 400 MG tablet Take 400 mg by mouth 2 (two) times daily.   . metFORMIN (GLUCOPHAGE) 1000 MG tablet Take 1 tablet (1,000 mg total) by mouth 2 (two) times daily with a meal.  . metoprolol tartrate (LOPRESSOR) 25 MG tablet Take 1 tablet (25 mg total) by mouth 2 (two) times daily.  Marland Kitchen nystatin cream (MYCOSTATIN) Apply 1 application topically 2 (two) times daily.  [name] Delica Lancets 02R MISC Use 3 times a day  . tamsulosin (FLOMAX) 0.4 MG CAPS capsule TAKE 1 CAPSULE BY MOUTH EVERY DAY  . rosuvastatin (CRESTOR) 5 MG tablet Take 1 tablet (5 mg total) by mouth daily.   No facility-administered medications prior to visit.    Review of Systems  Constitutional: Negative for appetite change, chills and fever.  Respiratory:  Negative for chest tightness, shortness of breath and wheezing.   Cardiovascular: Negative for chest pain and palpitations.  Gastrointestinal: Negative for abdominal pain, nausea and vomiting.       Objective    BP 117/69 (BP Location: Right Arm, Patient Position: Sitting, Cuff Size: Large)   Pulse 64   Temp 98.4 F (36.9 C) (Oral)   Resp 16   Ht 5\' 10"  (1.778 m)   Wt 257 lb (116.6 kg)   SpO2 97%   BMI 36.88 kg/m  BP Readings from Last 3 Encounters:  04/01/20 117/69  02/26/20 110/67  02/13/20 120/60   Wt Readings from Last 3 Encounters:  04/01/20 257 lb (116.6 kg)  02/26/20 256 lb  (116.1 kg)  02/13/20 253 lb 6 oz (114.9 kg)      Physical Exam Vitals reviewed.  Constitutional:      Appearance: He is obese.  HENT:     Head: Normocephalic and atraumatic.  Eyes:     General: No scleral icterus.    Conjunctiva/sclera: Conjunctivae normal.  Cardiovascular:     Rate and Rhythm: Normal rate and regular rhythm.     Pulses: Normal pulses.     Heart sounds: Normal heart sounds.  Pulmonary:     Effort: Pulmonary effort is normal.     Breath sounds: Normal breath sounds.  Abdominal:     Palpations: Abdomen is soft.  Musculoskeletal:     Right lower leg: No edema.     Left lower leg: No edema.  Lymphadenopathy:     Cervical: No cervical adenopathy.  Skin:    General: Skin is warm and dry.  Neurological:     General: No focal deficit present.     Mental Status: He is alert and oriented to person, place, and time.  Psychiatric:        Mood and Affect: Mood normal.        Behavior: Behavior normal.        Thought Content: Thought content normal.       No results found for any visits on 04/01/20.  Assessment & Plan     1. Poorly controlled type 2 diabetes mellitus ([employer]) Start Trulicity weekly and decrease Tresiba from 30 units to 25 units daily.  Follow-up for A1c in the near future.  2. Need for influenza vaccination  - Flu Vaccine QUAD 36+ mos IM  3. Morbid obesity ([place]) Pager issue for this patient  4. 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 S/P AVR (aortic valve replacement)   6. Primary hypertension   7. Mixed hyperlipidemia    No follow-ups on file.      I, [name], MD, have reviewed all documentation for this visit. The documentation on 04/06/20 for the exam, diagnosis, procedures, and orders are all accurate and complete.    [name] Mon, MD  [facility] [phone] (phone) [phone] (fax)  [place]

## 2020-04-01 NOTE — Patient Instructions (Signed)
Decrease Tresiba from 30 units to 25 units daily. Start weekly Trulicity  on Tuesdays

## 2020-04-02 ENCOUNTER — Encounter: Payer: No Typology Code available for payment source | Admitting: *Deleted

## 2020-04-02 ENCOUNTER — Other Ambulatory Visit: Payer: Self-pay

## 2020-04-02 DIAGNOSIS — Z951 Presence of aortocoronary bypass graft: Secondary | ICD-10-CM

## 2020-04-02 NOTE — Progress Notes (Signed)
Daily Session Note  Patient Details  Name: RAYMUNDO ROUT MRN: [id number] Date of Birth: 10-Dec-1955 Referring Provider:     Cardiac Rehab from 01/03/2020 in [facility] Cardiac and Pulmonary Rehab  Referring Provider [name], MD      Encounter Date: 04/02/2020  Check In:  Session Check In - 04/02/20 1601      Check-In   Supervising physician immediately available to respond to emergencies See telemetry face sheet for immediately available ER MD    Location [facility]-Cardiac & Pulmonary Rehab    Staff Present Renita Papa, RN BSN;Joseph Foy [place], [place], ACSM CEP, Exercise Physiologist;Kara Eliezer Bottom, MS Exercise Physiologist    Virtual Visit No    Medication changes reported     No    Fall or balance concerns reported    No    Warm-up and Cool-down Performed on first and last piece of equipment    Resistance Training Performed Yes    VAD Patient? No    PAD/SET Patient? No      Pain Assessment   Currently in Pain? No/denies              Social History   Tobacco Use  Smoking Status Never Smoker  Smokeless Tobacco Never Used    Goals Met:  Independence with exercise equipment Exercise tolerated well No report of cardiac concerns or symptoms Strength training completed today  Goals Unmet:  Not Applicable  Comments: Pt able to follow exercise prescription today without complaint.  Will continue to monitor for progression.    Dr. Emily Filbert is Medical Director for [place] and [facility].

## 2020-04-07 ENCOUNTER — Encounter: Payer: No Typology Code available for payment source | Admitting: *Deleted

## 2020-04-07 ENCOUNTER — Other Ambulatory Visit: Payer: Self-pay

## 2020-04-07 DIAGNOSIS — Z951 Presence of aortocoronary bypass graft: Secondary | ICD-10-CM | POA: Diagnosis not present

## 2020-04-07 NOTE — Progress Notes (Signed)
Daily Session Note  Patient Details  Name: Jeffery West MRN: [id number] Date of Birth: [birth year] Referring Provider:     Cardiac Rehab from 01/03/2020 in [facility] Cardiac and Pulmonary Rehab  Referring Provider [name], MD      Encounter Date: 04/07/2020  Check In:  Session Check In - 04/07/20 1600      Check-In   Supervising physician immediately available to respond to emergencies See telemetry face sheet for immediately available ER MD    Location [facility]-Cardiac & Pulmonary Rehab    Staff Present Renita Papa, RN Margurite Auerbach, MS Exercise Physiologist;Kelly Amedeo Plenty, BS, ACSM CEP, Exercise Physiologist;Other   Birdie Sons, RN   Virtual Visit No    Medication changes reported     Yes    Comments decreased Tyler Aas, added trulicity    Fall or balance concerns reported    No    Warm-up and Cool-down Performed on first and last piece of equipment    Resistance Training Performed Yes    VAD Patient? No    PAD/SET Patient? No      Pain Assessment   Currently in Pain? No/denies              Social History   Tobacco Use  Smoking Status Never Smoker  Smokeless Tobacco Never Used    Goals Met:  Independence with exercise equipment Exercise tolerated well No report of cardiac concerns or symptoms Strength training completed today  Goals Unmet:  Not Applicable  Comments: Pt able to follow exercise prescription today without complaint.  Will continue to monitor for progression.    Dr. Emily Filbert is Medical Director for [place] and [facility].

## 2020-04-09 ENCOUNTER — Other Ambulatory Visit: Payer: Self-pay

## 2020-04-09 DIAGNOSIS — Z951 Presence of aortocoronary bypass graft: Secondary | ICD-10-CM

## 2020-04-09 NOTE — Progress Notes (Signed)
Daily Session Note  Patient Details  Name: KORVER GRAYBEAL MRN: [id number] Date of Birth: [birth year] Referring Provider:     Cardiac Rehab from 01/03/2020 in [facility] Cardiac and Pulmonary Rehab  Referring Provider [name], MD      Encounter Date: 04/09/2020  Check In:  Session Check In - 04/09/20 1624      Check-In   Supervising physician immediately available to respond to emergencies See telemetry face sheet for immediately available ER MD    Location [facility]-Cardiac & Pulmonary Rehab    Staff Present Renita Papa, RN BSN;Joseph Lou [place], [place] Exercise Physiologist;Bhavik Cabiness Rosalia Hammers, MPA, RN    Virtual Visit No    Medication changes reported     No    Fall or balance concerns reported    No    Warm-up and Cool-down Performed on first and last piece of equipment    Resistance Training Performed Yes    VAD Patient? No    PAD/SET Patient? No      Pain Assessment   Currently in Pain? No/denies              Social History   Tobacco Use  Smoking Status Never Smoker  Smokeless Tobacco Never Used    Goals Met:  Independence with exercise equipment Exercise tolerated well No report of cardiac concerns or symptoms Strength training completed today  Goals Unmet:  Not Applicable  Comments: Pt able to follow exercise prescription today without complaint.  Will continue to monitor for progression.   Dr. Emily Filbert is Medical Director for [place] and [facility].

## 2020-04-14 ENCOUNTER — Other Ambulatory Visit: Payer: Self-pay

## 2020-04-14 DIAGNOSIS — Z951 Presence of aortocoronary bypass graft: Secondary | ICD-10-CM | POA: Diagnosis not present

## 2020-04-14 NOTE — Progress Notes (Signed)
Daily Session Note  Patient Details  Name: Jeffery West MRN: [id number] Date of Birth: 15-Aug-1955 Referring Provider:     Cardiac Rehab from 01/03/2020 in [facility] Cardiac and Pulmonary Rehab  Referring Provider [name], MD      Encounter Date: 04/14/2020  Check In:  Session Check In - 04/14/20 1607      Check-In   Supervising physician immediately available to respond to emergencies See telemetry face sheet for immediately available ER MD    Location [facility]-Cardiac & Pulmonary Rehab    Staff Present Birdie Sons, MPA, [place], BS, ACSM CEP, Exercise Physiologist;Meredith Sherryll Burger, RN Margurite Auerbach, MS Exercise Physiologist    Virtual Visit No    Medication changes reported     No    Fall or balance concerns reported    No    Warm-up and Cool-down Performed on first and last piece of equipment    Resistance Training Performed Yes    VAD Patient? No    PAD/SET Patient? No      Pain Assessment   Currently in Pain? No/denies              Social History   Tobacco Use  Smoking Status Never Smoker  Smokeless Tobacco Never Used    Goals Met:  Independence with exercise equipment Exercise tolerated well Personal goals reviewed No report of cardiac concerns or symptoms Strength training completed today  Goals Unmet:  Not Applicable  Comments: Pt able to follow exercise prescription today without complaint.  Will continue to monitor for progression.   Dr. Emily Filbert is Medical Director for [place] and [facility].

## 2020-04-16 ENCOUNTER — Other Ambulatory Visit: Payer: Self-pay

## 2020-04-16 DIAGNOSIS — Z951 Presence of aortocoronary bypass graft: Secondary | ICD-10-CM | POA: Diagnosis not present

## 2020-04-16 NOTE — Progress Notes (Signed)
Cardiac Individual Treatment Plan  Patient Details  Name: SELDEN NOTEBOOM MRN: [id number] Date of Birth: [birth year] Referring Provider:     Cardiac Rehab from 01/03/2020 in [facility] Cardiac and Pulmonary Rehab  Referring Provider [name], MD      Initial Encounter Date:    Cardiac Rehab from 01/03/2020 in [facility] Cardiac and Pulmonary Rehab  Date 01/03/20      Visit Diagnosis: S/P CABG x 2  Patient's Home Medications on Admission:  Current Outpatient Medications:  .  acetaminophen (TYLENOL) 500 MG tablet, Take 1,000 mg by mouth every 6 (six) hours as needed for moderate pain or headache., Disp: , Rfl:  .  albuterol (PROAIR HFA) 108 (90 Base) MCG/ACT inhaler, Inhale 1 puff into the lungs every 6 (six) hours as needed for wheezing or shortness of breath., Disp: 18 g, Rfl: 3 .  allopurinol (ZYLOPRIM) 300 MG tablet, Take 1 tablet (300 mg total) by mouth daily., Disp: 90 tablet, Rfl: 3 .  aspirin EC 325 MG EC tablet, Take 1 tablet (325 mg total) by mouth daily., Disp: , Rfl:  .  BD PEN NEEDLE NANO 2ND GEN 32G X 4 MM MISC, USE AS DIRECTED, Disp: 100 each, Rfl: 8 .  Cholecalciferol (VITAMIN D) 2000 units CAPS, Take 2,000 Units by mouth every evening. , Disp: , Rfl:  .  Dulaglutide (TRULICITY) 0.27 OZ/3.6UY SOPN, Inject 0.75 mg into the skin once a week., Disp: 0.5 mL, Rfl: 12 .  ezetimibe (ZETIA) 10 MG tablet, TAKE 1 TABLET BY MOUTH EVERY DAY, Disp: 90 tablet, Rfl: 3 .  glucose blood (ONETOUCH VERIO) test strip, Use as instructed; BID, Disp: 100 each, Rfl: 12 .  insulin aspart (NOVOLOG) 100 UNIT/ML injection, Inject 6 Units into the skin every evening., Disp: 10 mL, Rfl: 1 .  insulin degludec (TRESIBA FLEXTOUCH) 100 UNIT/ML FlexTouch Pen, Take 0.18m/ 0.5 ML Subcutaneously every Tuesday., Disp: 15 mL, Rfl: 5 .  insulin detemir (LEVEMIR) 100 UNIT/ML injection, Inject 0.5 mLs (50 Units total) into the skin daily., Disp: 10 mL, Rfl: 3 .  Insulin Syringe-Needle U-100 30G X 1/2" 1 ML MISC, Use with  insulin 3 times daily, Disp: 270 each, Rfl: 3 .  losartan (COZAAR) 50 MG tablet, Take 50 mg by mouth daily., Disp: , Rfl:  .  magnesium oxide (MAG-OX) 400 MG tablet, Take 400 mg by mouth 2 (two) times daily. , Disp: , Rfl:  .  metFORMIN (GLUCOPHAGE) 1000 MG tablet, Take 1 tablet (1,000 mg total) by mouth 2 (two) times daily with a meal., Disp: 180 tablet, Rfl: 0 .  metoprolol tartrate (LOPRESSOR) 25 MG tablet, Take 1 tablet (25 mg total) by mouth 2 (two) times daily., Disp: 180 tablet, Rfl: 3 .  nystatin cream (MYCOSTATIN), Apply 1 application topically 2 (two) times daily., Disp: 30 g, Rfl: 6 .  OneTouch Delica Lancets 340HMISC, Use 3 times a day, Disp: 100 each, Rfl: 12 .  rosuvastatin (CRESTOR) 5 MG tablet, Take 1 tablet (5 mg total) by mouth daily., Disp: 90 tablet, Rfl: 3 .  tamsulosin (FLOMAX) 0.4 MG CAPS capsule, TAKE 1 CAPSULE BY MOUTH EVERY DAY, Disp: 90 capsule, Rfl: 3  Past Medical History: Past Medical History:  Diagnosis Date  . Coronary artery disease   . Diabetes mellitus without complication ([place]   . Erectile dysfunction   . Gout   . Heart murmur   . Hyperlipidemia   . Hypertension   . Obesity   . Osteoarthritis   . Vitamin D  deficiency     Tobacco Use: Social History   Tobacco Use  Smoking Status Never Smoker  Smokeless Tobacco Never Used    Labs: Recent Review Flowsheet Data    Labs for ITP Cardiac and Pulmonary Rehab Latest Ref Rng & Units 11/26/2019 11/27/2019 11/27/2019 12/18/2019 02/26/2020   Cholestrol 100 - 199 mg/dL - - - 108 -   LDLCALC 0 - 99 mg/dL - - - 53 -   HDL >39 mg/dL - - - 35(L) -   Trlycerides 0 - 149 mg/dL - - - 110 -   Hemoglobin A1c 4.0 - 5.6 % - - - - 8.2(A)   PHART 7.35 - 7.45 7.309(L) 7.293(L) 7.289(L) - -   PCO2ART 32 - 48 mmHg 50.7(H) 49.7(H) 54.8(H) - -   HCO3 20.0 - 28.0 mmol/L 25.5 24.1 26.2 - -   TCO2 22 - 32 mmol/L $RemoveB'27 26 28 'jnSHIpej$ - -   ACIDBASEDEF 0.0 - 2.0 mmol/L 1.0 3.0(H) 1.0 - -   O2SAT % 99.0 98.0 97.0 - -       Exercise  Target Goals: Exercise Program Goal: Individual exercise prescription set using results from initial 6 min walk test and THRR while considering  patient's activity barriers and safety.   Exercise Prescription Goal: Initial exercise prescription builds to 30-45 minutes a day of aerobic activity, 2-3 days per week.  Home exercise guidelines will be given to patient during program as part of exercise prescription that the participant will acknowledge.   Education: Aerobic Exercise & Resistance Training: - Gives group verbal and written instruction on the various components of exercise. Focuses on aerobic and resistive training programs and the benefits of this training and how to safely progress through these programs..   Cardiac Rehab from 04/02/2020 in [facility] Cardiac and Pulmonary Rehab  Date 02/07/20  Educator [facility]  Instruction Review Code 1- Verbalizes Understanding      Education: Exercise & Equipment Safety: - Individual verbal instruction and demonstration of equipment use and safety with use of the equipment.   Cardiac Rehab from 04/02/2020 in [facility] Cardiac and Pulmonary Rehab  Date 12/31/19  Educator [facility]  Instruction Review Code 1- Verbalizes Understanding      Education: Exercise Physiology & General Exercise Guidelines: - Group verbal and written instruction with models to review the exercise physiology of the cardiovascular system and associated critical values. Provides general exercise guidelines with specific guidelines to those with heart or lung disease.    Cardiac Rehab from 04/02/2020 in [facility] Cardiac and Pulmonary Rehab  Date 04/02/20  Educator [facility]  Instruction Review Code 1- Verbalizes Understanding      Education: Flexibility, Balance, Mind/Body Relaxation: Provides group verbal/written instruction on the benefits of flexibility and balance training, including mind/body exercise modes such as yoga, pilates and tai chi.  Demonstration and skill practice provided.    Cardiac Rehab from 04/02/2020 in [facility] Cardiac and Pulmonary Rehab  Date 02/21/20  Educator AS  Instruction Review Code 1- Verbalizes Understanding      Activity Barriers & Risk Stratification:   6 Minute Walk:  6 Minute Walk    Row Name 01/03/20 1315 04/02/20 1724       6 Minute Walk   Phase Initial Discharge    Distance 1175 feet 1215 feet    Distance % Change -- 3.4 %    Distance Feet Change -- 40 ft    Walk Time 6 minutes 6 minutes    # of Rest Breaks 0 0  MPH 2.22 2.3    METS 2.69 2.81    RPE 13 12    Perceived Dyspnea  0 0    VO2 Peak 9.42 9.86    Symptoms No No    Resting HR 67 bpm 75 bpm    Resting BP 128/66 132/68    Resting Oxygen Saturation  96 % 96 %    Exercise Oxygen Saturation  during 6 min walk 95 % 94 %    Max Ex. HR 91 bpm 95 bpm    Max Ex. BP 150/68 158/64    2 Minute Post BP 122/64 --           Oxygen Initial Assessment:   Oxygen Re-Evaluation:   Oxygen Discharge (Final Oxygen Re-Evaluation):   Initial Exercise Prescription:  Initial Exercise Prescription - 01/03/20 1300      Date of Initial Exercise RX and Referring Provider   Date 01/03/20    Referring Provider [name], MD      Treadmill   MPH 1.9    Grade 0.5    Minutes 15    METs 2.59      REL-XR   Level 2    Speed 50    Minutes 15    METs 2.6      T5 Nustep   Level 2    SPM 80    Minutes 15    METs 2.6      Prescription Details   Frequency (times per week) 2    Duration Progress to 30 minutes of continuous aerobic without signs/symptoms of physical distress      Intensity   THRR 40-80% of Max Heartrate 103-139    Ratings of Perceived Exertion 11-13    Perceived Dyspnea 0-4      Progression   Progression Continue to progress workloads to maintain intensity without signs/symptoms of physical distress.      Resistance Training   Training Prescription Yes    Weight 3 lb    Reps 10-15           Perform Capillary Blood Glucose checks as  needed.  Exercise Prescription Changes:  Exercise Prescription Changes    Row Name 01/03/20 1300 01/21/20 1700 02/05/20 0900 02/18/20 1300 03/04/20 0800     Response to Exercise   Blood Pressure (Admit) 128/66 142/72 134/62 130/78 116/68   Blood Pressure (Exercise) 150/68 144/68 124/60 140/60 138/66   Blood Pressure (Exit) 122/64 120/64 130/60 118/66 128/74   Heart Rate (Admit) 67 bpm 71 bpm 70 bpm 78 bpm 82 bpm   Heart Rate (Exercise) 91 bpm 91 bpm 81 bpm 89 bpm 98 bpm   Heart Rate (Exit) 67 bpm 63 bpm 59 bpm 66 bpm 83 bpm   Oxygen Saturation (Admit) 96 % -- -- -- --   Oxygen Saturation (Exercise) 95 % -- -- -- --   Oxygen Saturation (Exit) 97 % -- -- -- --   Rating of Perceived Exertion (Exercise) 13 12 12 12 13    Perceived Dyspnea (Exercise) 0 -- -- -- --   Symptoms None none none none none   Comments Walk Test Results -- -- -- --   Duration -- Continue with 30 min of aerobic exercise without signs/symptoms of physical distress. Continue with 30 min of aerobic exercise without signs/symptoms of physical distress. Continue with 30 min of aerobic exercise without signs/symptoms of physical distress. Continue with 30 min of aerobic exercise without signs/symptoms of physical distress.   Intensity -- THRR unchanged THRR unchanged  THRR unchanged THRR unchanged     Progression   Progression -- Continue to progress workloads to maintain intensity without signs/symptoms of physical distress. Continue to progress workloads to maintain intensity without signs/symptoms of physical distress. Continue to progress workloads to maintain intensity without signs/symptoms of physical distress. Continue to progress workloads to maintain intensity without signs/symptoms of physical distress.   Average METs -- 2.5 2.53 2.5 2.32     Resistance Training   Training Prescription -- Yes Yes Yes Yes   Weight -- 3 lb 3 lb 4 lb 4 lb   Reps -- 10-15 10-15 10-15 10-15     Interval Training   Interval Training  No No No No No     Treadmill   MPH -- 1.9 1.9 1.8 2.2   Grade -- 0.5 0.5 0.5 1   Minutes -- 15 15 15 15    METs -- 2.6 2.59 2.59 2.99     NuStep   Level -- -- -- -- 1   Minutes -- -- -- -- 15   METs -- -- -- -- 2.5     Recumbant Elliptical   Level -- -- -- -- 1.5   Minutes -- -- -- -- 15   METs -- -- -- -- 1.8     REL-XR   Level -- -- 2 -- --   Minutes -- -- 15 -- --   METs -- -- 2.2 -- --     T5 Nustep   Level -- 2 2 2  --   SPM -- 80 -- 80 --   Minutes -- 15 15 15  --   METs -- 2.5 2.8 2.4 --     Biostep-RELP   Level -- -- -- -- 2   Minutes -- -- -- -- 15   METs -- -- -- -- 2     Home Exercise Plan   Plans to continue exercise at -- -- Home (comment)  walking and staff videos Home (comment)  walking and staff videos Home (comment)  walking and staff videos   Frequency -- -- Add 2 additional days to program exercise sessions. Add 2 additional days to program exercise sessions. Add 2 additional days to program exercise sessions.   Initial Home Exercises Provided -- -- 02/05/20 02/05/20 02/05/20   Row Name 03/17/20 1200 03/31/20 1600 04/16/20 1300         Response to Exercise   Blood Pressure (Admit) 130/72 122/60 122/72     Blood Pressure (Exercise) 144/76 136/88 142/82     Blood Pressure (Exit) 118/58 120/66 116/64     Heart Rate (Admit) 99 bpm 70 bpm 93 bpm     Heart Rate (Exercise) 102 bpm 95 bpm 99 bpm     Heart Rate (Exit) 88 bpm 72 bpm 80 bpm     Rating of Perceived Exertion (Exercise) 12 13 12      Symptoms none none none     Duration Continue with 30 min of aerobic exercise without signs/symptoms of physical distress. Continue with 30 min of aerobic exercise without signs/symptoms of physical distress. Continue with 30 min of aerobic exercise without signs/symptoms of physical distress.     Intensity THRR unchanged THRR unchanged THRR unchanged       Progression   Progression Continue to progress workloads to maintain intensity without signs/symptoms of  physical distress. Continue to progress workloads to maintain intensity without signs/symptoms of physical distress. Continue to progress workloads to maintain intensity without signs/symptoms of physical distress.  Average METs 3.3 2.9 3.4       Resistance Training   Training Prescription Yes Yes Yes     Weight 4 lb 4 lb 4 lb     Reps 10-15 10-15 10-15       Interval Training   Interval Training No No No       Treadmill   MPH 2.2 -- --     Grade 1 -- --     Minutes 15 -- --     METs 2.99 -- --       NuStep   Level $Remo'3 3 4     'KhgjY$ SPM 80 -- --     Minutes $Remove'15 15 15     'rekglrN$ METs 3.6 3.8 3.7       Biostep-RELP   Level -- 2 4     Minutes -- 15 15     METs -- 2 3       Home Exercise Plan   Plans to continue exercise at Home (comment)  walking and staff videos Home (comment)  walking and staff videos Home (comment)  walking and staff videos     Frequency Add 2 additional days to program exercise sessions. Add 2 additional days to program exercise sessions. Add 2 additional days to program exercise sessions.     Initial Home Exercises Provided 02/05/20 02/05/20 02/05/20            Exercise Comments:   Exercise Goals and Review:  Exercise Goals    Row Name 01/03/20 1044             Exercise Goals   Increase Physical Activity Yes       Intervention Provide advice, education, support and counseling about physical activity/exercise needs.;Develop an individualized exercise prescription for aerobic and resistive training based on initial evaluation findings, risk stratification, comorbidities and participant's personal goals.       Expected Outcomes Short Term: Attend rehab on a regular basis to increase amount of physical activity.;Long Term: Add in home exercise to make exercise part of routine and to increase amount of physical activity.;Long Term: Exercising regularly at least 3-5 days a week.       Increase Strength and Stamina Yes       Intervention Provide advice, education,  support and counseling about physical activity/exercise needs.;Develop an individualized exercise prescription for aerobic and resistive training based on initial evaluation findings, risk stratification, comorbidities and participant's personal goals.       Expected Outcomes Short Term: Increase workloads from initial exercise prescription for resistance, speed, and METs.;Short Term: Perform resistance training exercises routinely during rehab and add in resistance training at home;Long Term: Improve cardiorespiratory fitness, muscular endurance and strength as measured by increased METs and functional capacity (6MWT)       Able to understand and use rate of perceived exertion (RPE) scale Yes       Intervention Provide education and explanation on how to use RPE scale       Expected Outcomes Short Term: Able to use RPE daily in rehab to express subjective intensity level;Long Term:  Able to use RPE to guide intensity level when exercising independently       Able to understand and use Dyspnea scale Yes       Intervention Provide education and explanation on how to use Dyspnea scale       Expected Outcomes Short Term: Able to use Dyspnea scale daily in rehab to express subjective sense of shortness of breath  during exertion;Long Term: Able to use Dyspnea scale to guide intensity level when exercising independently       Knowledge and understanding of Target Heart Rate Range (THRR) Yes       Intervention Provide education and explanation of THRR including how the numbers were predicted and where they are located for reference       Expected Outcomes Short Term: Able to state/look up THRR;Short Term: Able to use daily as guideline for intensity in rehab;Long Term: Able to use THRR to govern intensity when exercising independently       Able to check pulse independently Yes       Intervention Provide education and demonstration on how to check pulse in carotid and radial arteries.;Review the importance of  being able to check your own pulse for safety during independent exercise       Expected Outcomes Short Term: Able to explain why pulse checking is important during independent exercise;Long Term: Able to check pulse independently and accurately       Understanding of Exercise Prescription Yes       Intervention Provide education, explanation, and written materials on patient's individual exercise prescription       Expected Outcomes Short Term: Able to explain program exercise prescription;Long Term: Able to explain home exercise prescription to exercise independently              Exercise Goals Re-Evaluation :  Exercise Goals Re-Evaluation    Row Name 01/21/20 1750 02/05/20 0819 02/18/20 1315 03/04/20 0828 03/17/20 1232     Exercise Goal Re-Evaluation   Exercise Goals Review Increase Physical Activity;Increase Strength and Stamina;Understanding of Exercise Prescription Increase Physical Activity;Increase Strength and Stamina;Understanding of Exercise Prescription Increase Physical Activity;Increase Strength and Stamina;Understanding of Exercise Prescription Increase Physical Activity;Increase Strength and Stamina;Understanding of Exercise Prescription Increase Physical Activity;Increase Strength and Stamina;Understanding of Exercise Prescription   Comments Larita Fife is tolerating exercise well in his first two weeks of exercise. Staff will monitor progress. Larita Fife is doing well in rehab.  He has already started to notice that things are getting easier. Reviewed home exercise with pt today.  Pt plans to walk and use staff videos at home for exercise.  Reviewed THR, pulse, RPE, sign and symptoms, pulse oximetery and when to call 911 or MD.  Also discussed weather considerations and indoor options.  Pt voiced understanding. Larita Fife attends consistently.  He works at [employer] and doesnt quite reach THR range.  Staff will encourage him to increase speed or grade on TM and resistance on machines.  he has increased  to 4 lb or strength work. Larita Fife has changed his schedule due to work and is now coming in the afternoons.  We will need to increase his levels on his new equipment.  We will continue to monitor his progress. --   Expected Outcomes Short:  continue to attend consistently Long:  build strength and stamina Short: Start to add in more walking at home again  Long: Continue to build stamina. Short: increase levels to rach THR range Long: increase stamina Short: Increase NS and BS (new station) Long: Continue to improve stamina. --   Row Name 03/17/20 1233 03/17/20 1712 03/31/20 1625 04/14/20 1619       Exercise Goal Re-Evaluation   Exercise Goals Review Increase Physical Activity;Increase Strength and Stamina;Understanding of Exercise Prescription Increase Physical Activity;Increase Strength and Stamina;Understanding of Exercise Prescription Increase Physical Activity;Increase Strength and Stamina;Understanding of Exercise Prescription Increase Physical Activity;Increase Strength and Stamina;Understanding of Exercise Prescription  Comments Jeani Hawking has increased levels on T4 and T5 to level 3.  He has increased to 4 lb weights.  Staff will monitor progress. Jeani Hawking has been doing well in rehab. He stays active outside by doing yardwork. Sometimes walks with his wife for 20 minute increments. Encouraged to maintain the planned and continuous exercise for at least 30 minutes. He does take his pulse sometimes but not consistent with it. Does not use RPE at home, talked about importance of taking HR, using RPE, and maintaining THR level. Jeani Hawking is thinking of going to the [employer] after he graduates from [employer]. Jeani Hawking is doing well in rehab.  He is nearing his post 6MWT and we expect to see an improvement.  He is planning to continue to walk after graduation.  We will continue to monitor his progress. Jeani Hawking is getting ready to graduate from [employer]. He improved his 6MWT by 3.4%. After HT, he plans to walk at home, as he  cannot afford a gym rmembmership right now. He also has weights that he uses at home.  He plans to attend a gym facility once he gets his finances in order.    Expected Outcomes Short: continue to attend consistently Long: increase overall MET level Short: Use RPE and THR at home. Look into [employer] details and graduate Long: Able to exercise independently at home Short: Improve post 6MWT Long: Continue to improve stamina. Short: Graduate Long: Continue to exercise at home independently and continuous           Discharge Exercise Prescription (Final Exercise Prescription Changes):  Exercise Prescription Changes - 04/16/20 1300      Response to Exercise   Blood Pressure (Admit) 122/72    Blood Pressure (Exercise) 142/82    Blood Pressure (Exit) 116/64    Heart Rate (Admit) 93 bpm    Heart Rate (Exercise) 99 bpm    Heart Rate (Exit) 80 bpm    Rating of Perceived Exertion (Exercise) 12    Symptoms none    Duration Continue with 30 min of aerobic exercise without signs/symptoms of physical distress.    Intensity THRR unchanged      Progression   Progression Continue to progress workloads to maintain intensity without signs/symptoms of physical distress.    Average METs 3.4      Resistance Training   Training Prescription Yes    Weight 4 lb    Reps 10-15      Interval Training   Interval Training No      NuStep   Level 4    Minutes 15    METs 3.7      Biostep-RELP   Level 4    Minutes 15    METs 3      Home Exercise Plan   Plans to continue exercise at Home (comment)   walking and staff videos   Frequency Add 2 additional days to program exercise sessions.    Initial Home Exercises Provided 02/05/20           Nutrition:  Target Goals: Understanding of nutrition guidelines, daily intake of sodium '1500mg'$ , cholesterol '200mg'$ , calories 30% from fat and 7% or less from saturated fats, daily to have 5 or more servings of fruits and vegetables.  Education: Controlling  Sodium/Reading Food Labels -Group verbal and written material supporting the discussion of sodium use in heart healthy nutrition. Review and explanation with models, verbal and written materials for utilization of the food label.   Education: General Nutrition Guidelines/Fats and Fiber: -  Group instruction provided by verbal, written material, models and posters to present the general guidelines for heart healthy nutrition. Gives an explanation and review of dietary fats and fiber.   Biometrics:  Pre Biometrics - 01/03/20 1027      Pre Biometrics   Height 5' 10.75" (1.797 m)    Weight 253 lb 11.2 oz (115.1 kg)    BMI (Calculated) 35.64    Single Leg Stand 2.44 seconds            Nutrition Therapy Plan and Nutrition Goals:  Nutrition Therapy & Goals - 01/22/20 0755      Nutrition Therapy   Diet Heart healthy, low Na, T2DM    Drug/Food Interactions Food/Disease   Insulin - Levemir and Novolog   Protein (specify units) 95-100g    Fiber 30 grams    Whole Grain Foods 3 servings    Saturated Fats 12 max. grams    Fruits and Vegetables 5 servings/day    Sodium 1.5 grams      Personal Nutrition Goals   Nutrition Goal ST: utilize MyPlate to include more whole grains/ starchy vegetables he enjoys, use spreadable margarine vs stick margarine, continue with current chnages LT: continue with heart healthy changes and lower BG    Comments CHO 60g/meal - 200g/day. Trying to avoid starchy vegetables and grains. B: 2 pieces of whole wheat toast with yogurt L: tuna cottage cheese and avocado and salads or leftover D: chicken and vegetables (heart healthy cooking methods) - margarine (stick) - small amount. BG: ~150-170. Levemir and novolog, recently lowered insulin. One at dinner, not at breakfast and lunch. Discussed heart healthy eating and T2DM friendly eating - discussed whole grains and MyPlate      Intervention Plan   Intervention Prescribe, educate and counsel regarding individualized  specific dietary modifications aiming towards targeted core components such as weight, hypertension, lipid management, diabetes, heart failure and other comorbidities.;Nutrition handout(s) given to patient.    Expected Outcomes Short Term Goal: Understand basic principles of dietary content, such as calories, fat, sodium, cholesterol and nutrients.;Short Term Goal: A plan has been developed with personal nutrition goals set during dietitian appointment.;Long Term Goal: Adherence to prescribed nutrition plan.           Nutrition Assessments:  Nutrition Assessments - 04/16/20 1618      MEDFICTS Scores   Post Score 29           MEDIFICTS Score Key:          ?70 Need to make dietary changes          40-70 Heart Healthy Diet         ? 40 Therapeutic Level Cholesterol Diet  Nutrition Goals Re-Evaluation:  Nutrition Goals Re-Evaluation    Row Name 02/26/20 0903 03/12/20 1615 04/14/20 1624         Goals   Nutrition Goal ST: utilize MyPlate to include more whole grains/ starchy vegetables he enjoys, use spreadable margarine vs stick margarine, continue with current chnages LT: continue with heart healthy changes and lower BG ST: ST: continue with current changes LT: continue with heart healthy changes and lower BG Jeani Hawking is ready to graduate.  He will continue to make changes with his diet and maintain current nutrition goals. He is keeping an eye his sugars, maintainng an average around 140s. Jeani Hawking is excited to graduate.     Comment Continue with current changes Uses spreadable margarine on whole wheat toast. Pt reports doing well with his nutriton,  he reports his BG has been lower ~135-140, medication decreased. Encouraged pt that he is making progress. Continue wuth current goal settings and graduate     Expected Outcome ST: utilize MyPlate to include more whole grains/ starchy vegetables he enjoys, use spreadable margarine vs stick margarine, continue with current chnages LT: continue with  heart healthy changes and lower BG ST: ST: continue with current changes LT: continue with heart healthy changes and lower BG Short: Graduate Long: Maintain lifestyle changes with diet and apply education at home            Nutrition Goals Discharge (Final Nutrition Goals Re-Evaluation):  Nutrition Goals Re-Evaluation - 04/14/20 1624      Goals   Nutrition Goal Jeani Hawking is ready to graduate.  He will continue to make changes with his diet and maintain current nutrition goals. He is keeping an eye his sugars, maintainng an average around 140s. Jeani Hawking is excited to graduate.    Comment Continue wuth current goal settings and graduate    Expected Outcome Short: Graduate Long: Maintain lifestyle changes with diet and apply education at home           Psychosocial: Target Goals: Acknowledge presence or absence of significant depression and/or stress, maximize coping skills, provide positive support system. Participant is able to verbalize types and ability to use techniques and skills needed for reducing stress and depression.   Education: Depression - Provides group verbal and written instruction on the correlation between heart/lung disease and depressed mood, treatment options, and the stigmas associated with seeking treatment.   Cardiac Rehab from 04/02/2020 in [facility] Cardiac and Pulmonary Rehab  Date 03/26/20  Educator [facility]  Instruction Review Code 1- [employer] Understanding      Education: Sleep Hygiene -Provides group verbal and written instruction about how sleep can affect your health.  Define sleep hygiene, discuss sleep cycles and impact of sleep habits. Review good sleep hygiene tips.     Education: Stress and Anxiety: - Provides group verbal and written instruction about the health risks of elevated stress and causes of high stress.  Discuss the correlation between heart/lung disease and anxiety and treatment options. Review healthy ways to manage with stress and anxiety.    Cardiac Rehab from 04/02/2020 in [facility] Cardiac and Pulmonary Rehab  Date 03/26/20  Educator [facility]  Instruction Review Code 1- Verbalizes Understanding       Initial Review & Psychosocial Screening:  Initial Psych Review & Screening - 12/31/19 1434      Initial Review   Current issues with None Identified      Family Dynamics   Good Support System? Yes    Comments He can look to his wife and son.      Barriers   Psychosocial barriers to participate in program The patient should benefit from training in stress management and relaxation.;There are no identifiable barriers or psychosocial needs.      Screening Interventions   Interventions Encouraged to exercise    Expected Outcomes Short Term goal: Utilizing psychosocial counselor, staff and physician to assist with identification of specific Stressors or current issues interfering with healing process. Setting desired goal for each stressor or current issue identified.;Long Term Goal: Stressors or current issues are controlled or eliminated.;Short Term goal: Identification and review with participant of any Quality of Life or Depression concerns found by scoring the questionnaire.;Long Term goal: The participant improves quality of Life and PHQ9 Scores as seen by post scores and/or verbalization of changes  Quality of Life Scores:   Quality of Life - 04/16/20 1616      Quality of Life   Select Quality of Life      Quality of Life Scores   Health/Function Pre 21.27 %    Health/Function Post 20.8 %    Health/Function % Change -2.21 %    Socioeconomic Pre 19.36 %    Socioeconomic Post 22.56 %    Socioeconomic % Change  16.53 %    Psych/Spiritual Pre 16.79 %    Psych/Spiritual Post 16.79 %    Psych/Spiritual % Change 0 %    Family Pre 24.9 %    Family Post 22.6 %    Family % Change -9.24 %    GLOBAL Pre 20.49 %    GLOBAL Post 20.66 %    GLOBAL % Change 0.83 %          Scores of 19 and below usually indicate a  poorer quality of life in these areas.  A difference of  2-3 points is a clinically meaningful difference.  A difference of 2-3 points in the total score of the Quality of Life Index has been associated with significant improvement in overall quality of life, self-image, physical symptoms, and general health in studies assessing change in quality of life.  PHQ-9: Recent Review Flowsheet Data    Depression screen [facility] 2/9 04/16/2020 02/05/2020 01/03/2020 07/16/2019 06/28/2017   Decreased Interest 1 0 1 0 0   Down, Depressed, Hopeless 0 0 1 0 0   PHQ - 2 Score 1 0 2 0 0   Altered sleeping $RemoveBeforeDE'1 1 1 'YKtAPnvJBNneTzX$ 0 0   Tired, decreased energy $RemoveBeforeDE'1 1 1 'lEoRSmMucWPtuhl$ 0 0   Change in appetite 1 1  0 0 0   Feeling bad or failure about yourself  1 0 0 0 0   Trouble concentrating 1 0 1 0 0   Moving slowly or fidgety/restless 0 0 0 0 0   Suicidal thoughts 1  0 0 0 0   PHQ-9 Score $RemoveBef'7 3 5 'hNRHkXAuma$ 0 0   Difficult doing work/chores Somewhat difficult Not difficult at all Not difficult at all Not difficult at all Not difficult at all     Interpretation of Total Score  Total Score Depression Severity:  1-4 = Minimal depression, 5-9 = Mild depression, 10-14 = Moderate depression, 15-19 = Moderately severe depression, 20-27 = Severe depression   Psychosocial Evaluation and Intervention:  Psychosocial Evaluation - 04/14/20 1621      Psychosocial Evaluation & Interventions   Interventions Encouraged to exercise with the program and follow exercise prescription    Comments Jeani Hawking is doing well and is ready to graduate. Sleep has always been an issue for him and  is not interested in taking a sleep aide or talking to his provider. He still continues to have good support from his family. Denies any big stressors at this time.    Expected Outcomes Short: Graduate Long: Continue exercise to maintain positive attitude    Continue Psychosocial Services  Follow up required by staff      Discharge Psychosocial Assessment & Intervention   Comments Jeani Hawking has enjoyed  the program. He feels like he has improved his exercise tolerance. He is wanting to move more at home, and feels like he has more encouragement and confidence to get up and moving . Has enjoyed rehab and excited to graduate and utilize his skills at home.           Psychosocial  Re-Evaluation:  Psychosocial Re-Evaluation    Row Name 02/05/20 0809 03/17/20 1627 03/17/20 1723         Psychosocial Re-Evaluation   Current issues with Current Sleep Concerns;Current Stress Concerns Current Sleep Concerns;Current Stress Concerns Current Sleep Concerns;Current Stress Concerns     Comments Larita Fife is doing well in rehab.  His PHQ score has gone down.  He admitted to cheating on his diet when the grandkids visit.  He enjoys them, but they wreck his diet.  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  He needs to be tired to get to sleep.  He also will wake up early some days.  He has never really tried anything to help with sleep and no interest in trying. He continues to worry about his mom in her 63s living by herself and recently had flooding from her dishwasher. -- Larita Fife has a great support system from his wife and grandchildren. Last time he completed his PHQ went down. He is holding up well mentally. He states he has always had an issue with sleeping due to worry about finance, health, family, etc. He is not interested in trying new medications or talking to his doctor at this time. Hopes to use exercise as stress management and increase his fatigue at night.     Expected Outcomes Short: Enjoy grandkids  Long: Continue to focus on positive. -- Short: Focus on maintain better sleep patterns Long: Continue to maintain positive attitude/use exercise for stress management     Interventions Encouraged to attend Cardiac Rehabilitation for the exercise -- Encouraged to attend Cardiac Rehabilitation for the exercise     Continue Psychosocial Services  Follow up required by staff  -- Follow up required by staff            Psychosocial Discharge (Final Psychosocial Re-Evaluation):  Psychosocial Re-Evaluation - 03/17/20 1723      Psychosocial Re-Evaluation   Current issues with Current Sleep Concerns;Current Stress Concerns    Comments Larita Fife has a great support system from his wife and grandchildren. Last time he completed his PHQ went down. He is holding up well mentally. He states he has always had an issue with sleeping due to worry about finance, health, family, etc. He is not interested in trying new medications or talking to his doctor at this time. Hopes to use exercise as stress management and increase his fatigue at night.    Expected Outcomes Short: Focus on maintain better sleep patterns Long: Continue to maintain positive attitude/use exercise for stress management    Interventions Encouraged to attend Cardiac Rehabilitation for the exercise    Continue Psychosocial Services  Follow up required by staff           Vocational Rehabilitation: Provide vocational rehab assistance to qualifying candidates.   Vocational Rehab Evaluation & Intervention:   Education: Education Goals: Education classes will be provided on a variety of topics geared toward better understanding of heart health and risk factor modification. Participant will state understanding/return demonstration of topics presented as noted by education test scores.  Learning Barriers/Preferences:  Learning Barriers/Preferences - 12/31/19 1432      Learning Barriers/Preferences   Learning Barriers None    Learning Preferences None           General Cardiac Education Topics:  AED/CPR: - Group verbal and written instruction with the use of models to demonstrate the basic use of the AED with the basic ABC's of resuscitation.  Anatomy & Physiology of the Heart: - Group verbal and written instruction and models provide basic cardiac anatomy and physiology, with the coronary electrical  and arterial systems. Review of Valvular disease and Heart Failure   Cardiac Rehab from 04/02/2020 in [facility] Cardiac and Pulmonary Rehab  Date 02/14/20  Educator [facility]  Instruction Review Code 1- Verbalizes Understanding      Cardiac Procedures: - Group verbal and written instruction to review commonly prescribed medications for heart disease. Reviews the medication, class of the drug, and side effects. Includes the steps to properly store meds and maintain the prescription regimen. (beta blockers and nitrates)   Cardiac Rehab from 04/02/2020 in [facility] Cardiac and Pulmonary Rehab  Date 02/14/20  Educator [facility]  Instruction Review Code 1- Verbalizes Understanding      Cardiac Medications I: - Group verbal and written instruction to review commonly prescribed medications for heart disease. Reviews the medication, class of the drug, and side effects. Includes the steps to properly store meds and maintain the prescription regimen.   Cardiac Rehab from 04/02/2020 in [facility] Cardiac and Pulmonary Rehab  Date 01/17/20  Educator SB  Instruction Review Code 1- Verbalizes Understanding      Cardiac Medications II: -Group verbal and written instruction to review commonly prescribed medications for heart disease. Reviews the medication, class of the drug, and side effects. (all other drug classes)   Cardiac Rehab from 04/02/2020 in [facility] Cardiac and Pulmonary Rehab  Date 01/31/20  Educator SB  Instruction Review Code 1- Verbalizes Understanding       Go Sex-Intimacy & Heart Disease, Get SMART - Goal Setting: - Group verbal and written instruction through game format to discuss heart disease and the return to sexual intimacy. Provides group verbal and written material to discuss and apply goal setting through the application of the S.M.A.R.T. Method.   Cardiac Rehab from 04/02/2020 in [facility] Cardiac and Pulmonary Rehab  Date 02/14/20  Educator [facility]  Instruction Review Code 1- Verbalizes Understanding       Other Matters of the Heart: - Provides group verbal, written materials and models to describe Stable Angina and Peripheral Artery. Includes description of the disease process and treatment options available to the cardiac patient.   Infection Prevention: - Provides verbal and written material to individual with discussion of infection control including proper hand washing and proper equipment cleaning during exercise session.   Cardiac Rehab from 04/02/2020 in [facility] Cardiac and Pulmonary Rehab  Date 12/31/19  Educator [facility]  Instruction Review Code 1- Verbalizes Understanding      Falls Prevention: - Provides verbal and written material to individual with discussion of falls prevention and safety.   Cardiac Rehab from 04/02/2020 in [facility] Cardiac and Pulmonary Rehab  Date 12/31/19  Educator [facility]  Instruction Review Code 1- Verbalizes Understanding      Other: -Provides group and verbal instruction on various topics (see comments)   Knowledge Questionnaire Score:  Knowledge Questionnaire Score - 04/16/20 1616      Knowledge Questionnaire Score   Post Score 25/26           Core Components/Risk Factors/Patient Goals at Admission:  Personal Goals and Risk Factors at Admission - 01/03/20 1045      Core Components/Risk Factors/Patient Goals on Admission    Weight Management Yes;Obesity;Weight Loss    Intervention Weight Management: Provide education and appropriate resources to help participant work on and attain dietary goals.;Weight Management: Develop a combined nutrition and exercise program designed to reach desired caloric intake,  while maintaining appropriate intake of nutrient and fiber, sodium and fats, and appropriate energy expenditure required for the weight goal.;Weight Management/Obesity: Establish reasonable short term and long term weight goals.;Obesity: Provide education and appropriate resources to help participant work on and attain dietary goals.    Admit Weight  253 lb 11.2 oz (115.1 kg)    Goal Weight: Short Term 248 lb (112.5 kg)    Goal Weight: Long Term 243 lb (110.2 kg)    Expected Outcomes Short Term: Continue to assess and modify interventions until short term weight is achieved;Long Term: Adherence to nutrition and physical activity/exercise program aimed toward attainment of established weight goal;Weight Maintenance: Understanding of the daily nutrition guidelines, which includes 25-35% calories from fat, 7% or less cal from saturated fats, less than $RemoveB'200mg'IUxIUiBa$  cholesterol, less than 1.5gm of sodium, & 5 or more servings of fruits and vegetables daily;Weight Loss: Understanding of general recommendations for a balanced deficit meal plan, which promotes 1-2 lb weight loss per week and includes a negative energy balance of [phone] kcal/d;Understanding recommendations for meals to include 15-35% energy as protein, 25-35% energy from fat, 35-60% energy from carbohydrates, less than $RemoveB'200mg'fwkwZXAg$  of dietary cholesterol, 20-35 gm of total fiber daily;Understanding of distribution of calorie intake throughout the day with the consumption of 4-5 meals/snacks    Diabetes Yes    Intervention Provide education about signs/symptoms and action to take for hypo/hyperglycemia.;Provide education about proper nutrition, including hydration, and aerobic/resistive exercise prescription along with prescribed medications to achieve blood glucose in normal ranges: Fasting glucose 65-99 mg/dL    Expected Outcomes Short Term: Participant verbalizes understanding of the signs/symptoms and immediate care of hyper/hypoglycemia, proper foot care and importance of medication, aerobic/resistive exercise and nutrition plan for blood glucose control.;Long Term: Attainment of HbA1C < 7%.    Hypertension Yes    Intervention Provide education on lifestyle modifcations including regular physical activity/exercise, weight management, moderate sodium restriction and increased consumption of fresh fruit,  vegetables, and low fat dairy, alcohol moderation, and smoking cessation.;Monitor prescription use compliance.    Expected Outcomes Short Term: Continued assessment and intervention until BP is < 140/47mm HG in hypertensive participants. < 130/26mm HG in hypertensive participants with diabetes, heart failure or chronic kidney disease.;Long Term: Maintenance of blood pressure at goal levels.    Lipids Yes    Intervention Provide education and support for participant on nutrition & aerobic/resistive exercise along with prescribed medications to achieve LDL '70mg'$ , HDL >$Remo'40mg'TPkLM$ .    Expected Outcomes Short Term: Participant states understanding of desired cholesterol values and is compliant with medications prescribed. Participant is following exercise prescription and nutrition guidelines.;Long Term: Cholesterol controlled with medications as prescribed, with individualized exercise RX and with personalized nutrition plan. Value goals: LDL < $Rem'70mg'KrYN$ , HDL > 40 mg.           Education:Diabetes - Individual verbal and written instruction to review signs/symptoms of diabetes, desired ranges of glucose level fasting, after meals and with exercise. Acknowledge that pre and post exercise glucose checks will be done for 3 sessions at entry of program.   Cardiac Rehab from 04/02/2020 in [facility] Cardiac and Pulmonary Rehab  Date 12/31/19  Educator [facility]  Instruction Review Code 1- Verbalizes Understanding      Education: Know Your Numbers and Risk Factors: -Group verbal and written instruction about important numbers in your health.  Discussion of what are risk factors and how they play a role in the disease process.  Review of Cholesterol, Blood Pressure, Diabetes, and BMI and  the role they play in your overall health.   Cardiac Rehab from 04/02/2020 in [facility] Cardiac and Pulmonary Rehab  Date 01/31/20  Educator SB  Instruction Review Code 1- Verbalizes Understanding      Core Components/Risk Factors/Patient Goals  Review:   Goals and Risk Factor Review    Row Name 02/05/20 0810 03/17/20 1620 04/14/20 1625         Core Components/Risk Factors/Patient Goals Review   Personal Goals Review Weight Management/Obesity;Hypertension;Lipids;Diabetes Weight Management/Obesity;Hypertension;Lipids;Diabetes Weight Management/Obesity;Hypertension;Lipids;Diabetes     Review Jeani Hawking has been doing well in rehab.  He admitted to cheating some on his diet when the grandkids visit as his wife buys all the good stuff and he has no will power.  He is trying to do the best he can and does follow some weight watchers ideas. His blood pressures have been good in class.  He does not check them at home as he does not have a cuff. His blood sugars have done well in rehab, but a little off at home.  He has changed some of his meds and has noticed that they have been running higher in the 200s in the morning.  He is going to talk with doctor more about it and work on his diet. Lynn's weight has been more in the stable range- and admits he would like to lose another 10 lbs as one of his goals. He is attempting to eat healthier but states he will ocassionaly cave into desserts. He has met with the RD and established goals. Blood sugars at home have fluctuated- sometime higher in the mornings. He will reach out to his doctor if they continue to be high as his medications have been adjusted some. He checks his sugars 2x/day. BPs have been in good range here at rehab. Jeani Hawking is ready to graduate. He is staying compliant with his medications and was able to lower his insulin. BPs have been stable at rehab. His sugars have been stable aside from a couple high ones, though he just changed medications. He recognized symptoms if he gets too high or too low. He is still aiming to lose weight and is working toward specific nutrition goals. He plans to move more at home after graduation.     Expected Outcomes Short: Get sugars back under control  Long; Continue to  work on risk factors. Short: Keep monitoring sugars Long: Manage lifestyle risk factors Short: Graduate Long: Continue manage lifestyle risk factors at home and continue practicing education that was learned at [place] [employer]/Risk Factors/Patient Goals at Discharge (Final Review):   Goals and Risk Factor Review - 04/14/20 1625      Core Components/Risk Factors/Patient Goals Review   Personal Goals Review Weight Management/Obesity;Hypertension;Lipids;Diabetes    Review Jeani Hawking is ready to graduate. He is staying compliant with his medications and was able to lower his insulin. BPs have been stable at rehab. His sugars have been stable aside from a couple high ones, though he just changed medications. He recognized symptoms if he gets too high or too low. He is still aiming to lose weight and is working toward specific nutrition goals. He plans to move more at home after graduation.    Expected Outcomes Short: Graduate Long: Continue manage lifestyle risk factors at home and continue practicing education that was learned at [facility]           ITP Comments:  ITP Comments  [place] Name 12/31/19 1437 01/03/20 1011 01/30/20 0607 02/27/20 1519 03/26/20 0537   ITP Comments Virtual Visit completed. Patient informed on EP and RD appointment and 6 Minute walk test. Patient also informed of patient health questionnaires on My Chart. Patient Verbalizes understanding. Visit diagnosis can be found in [facility] 11/26/2019. Completed 6MWT and gym orientation. Initial ITP created and sent for review to Dr. Emily Filbert, Medical Director. 30 Day review completed. Medical Director ITP review done, changes made as directed, and signed approval by Medical Director. 30 day review completed. ITP sent to Dr. Emily Filbert, Medical Director of Cardiac and Pulmonary Rehab. Continue with ITP unless changes are made by physician. 30 Day review completed. Medical Director ITP review done, changes made as directed,  and signed approval by Medical Director.   Row Name 04/16/20 1610           ITP Comments Jarren graduated today from  rehab with 36 sessions completed.  Details of the patient's exercise prescription and what He needs to do in order to continue the prescription and progress were discussed with patient.  Patient was given a copy of prescription and goals.  Patient verbalized understanding.  Simran plans to continue to exercise by going to the [place].              Comments: discharge ITP

## 2020-04-16 NOTE — Progress Notes (Signed)
Daily Session Note  Patient Details  Name: DAXTEN KOVALENKO MRN: [id number] Date of Birth: 05-28-1956 Referring Provider:     Cardiac Rehab from 01/03/2020 in [facility] Cardiac [facility] Pul[facility] Rehab  Referring Provider [name], MD      Encounter Date: 04/16/2020  Check In:  Session Check In - 04/16/20 1604      Check-In   Supervising physician immediately available to respond to emergencies See telemetry face sheet for immediately available ER MD    Location [facility]-Cardiac & Pulmonary Rehab    Staff Present Birdie Sons, MPA, RN;Joseph Lou Miner, MS Exercise Physiologist    Virtual Visit No    Medication changes reported     No    Fall or balance concerns reported    No    Warm-up and Cool-down Performed on first and last piece of equipment    Resistance Training Performed Yes    VAD Patient? No    PAD/SET Patient? No      Pain Assessment   Currently in Pain? No/denies              Social History   Tobacco Use  Smoking Status Never Smoker  Smokeless Tobacco Never Used    Goals Met:  Independence with exercise equipment Exercise tolerated well No report of cardiac concerns or symptoms Strength training completed today  Goals Unmet:  Not Applicable  Comments:  Erickson graduated today from  rehab with 36 sessions completed.  Details of the patient's exercise prescription and what He needs to do in order to continue the prescription and progress were discussed with patient.  Patient was given a copy of prescription and goals.  Patient verbalized understanding.  Shuan plans to continue to exercise by going to the [place].    Dr. Emily Filbert is Medical Director for [place] and [facility].

## 2020-04-16 NOTE — Progress Notes (Signed)
Discharge Progress Report  Patient Details  Name: Jeffery West MRN: [id number] Date of Birth: [birth year] Referring Provider:     Cardiac Rehab from 01/03/2020 in [facility] Card[facility] Rehab  Referring Provider [name], MD       Number of Visits: 71  Reason for Discharge:  Patient reached a stable level of exercise. Patient independent in their exercise. Patient has met program and personal goals.  Smoking History:  Social History   Tobacco Use  Smoking Status Never Smoker  Smokeless Tobacco Never Used    Diagnosis:  S/P CABG x 2  ADL UCSD:   Initial Exercise Prescription:  Initial Exercise Prescription - 01/03/20 1300      Date of Initial Exercise RX and Referring Provider   Date 01/03/20    Referring Provider [name], MD      Treadmill   MPH 1.9    Grade 0.5    Minutes 15    METs 2.59      REL-XR   Level 2    Speed 50    Minutes 15    METs 2.6      T5 Nustep   Level 2    SPM 80    Minutes 15    METs 2.6      Prescription Details   Frequency (times per week) 2    Duration Progress to 30 minutes of continuous aerobic without signs/symptoms of physical distress      Intensity   THRR 40-80% of Max Heartrate 103-139    Ratings of Perceived Exertion 11-13    Perceived Dyspnea 0-4      Progression   Progression Continue to progress workloads to maintain intensity without signs/symptoms of physical distress.      Resistance Training   Training Prescription Yes    Weight 3 lb    Reps 10-15           Discharge Exercise Prescription (Final Exercise Prescription Changes):  Exercise Prescription Changes - 04/16/20 1300      Response to Exercise   Blood Pressure (Admit) 122/72    Blood Pressure (Exercise) 142/82    Blood Pressure (Exit) 116/64    Heart Rate (Admit) 93 bpm    Heart Rate (Exercise) 99 bpm    Heart Rate (Exit) 80 bpm    Rating of Perceived Exertion (Exercise) 12    Symptoms none    Duration Continue with 30  min of aerobic exercise without signs/symptoms of physical distress.    Intensity THRR unchanged      Progression   Progression Continue to progress workloads to maintain intensity without signs/symptoms of physical distress.    Average METs 3.4      Resistance Training   Training Prescription Yes    Weight 4 lb    Reps 10-15      Interval Training   Interval Training No      NuStep   Level 4    Minutes 15    METs 3.7      Biostep-RELP   Level 4    Minutes 15    METs 3      Home Exercise Plan   Plans to continue exercise at Home (comment)   walking and staff videos   Frequency Add 2 additional days to program exercise sessions.    Initial Home Exercises Provided 02/05/20           Functional Capacity:  6 Minute Walk    Row Name 01/03/20 1315  04/02/20 1724       6 Minute Walk   Phase Initial Discharge    Distance 1175 feet 1215 feet    Distance % Change -- 3.4 %    Distance Feet Change -- 40 ft    Walk Time 6 minutes 6 minutes    # of Rest Breaks 0 0    MPH 2.22 2.3    METS 2.69 2.81    RPE 13 12    Perceived Dyspnea  0 0    VO2 Peak 9.42 9.86    Symptoms No No    Resting HR 67 bpm 75 bpm    Resting BP 128/66 132/68    Resting Oxygen Saturation  96 % 96 %    Exercise Oxygen Saturation  during 6 min walk 95 % 94 %    Max Ex. HR 91 bpm 95 bpm    Max Ex. BP 150/68 158/64    2 Minute Post BP 122/64 --           Psychological, QOL, Others - Outcomes: PHQ 2/9: Depression screen [facility] 2/9 04/16/2020 02/05/2020 01/03/2020 07/16/2019 06/28/2017  Decreased Interest 1 0 1 0 0  Down, Depressed, Hopeless 0 0 1 0 0  PHQ - 2 Score 1 0 2 0 0  Altered sleeping '1 1 1 ' 0 0  Tired, decreased energy '1 1 1 ' 0 0  Change in appetite 1 1 0 0 0  Feeling bad or failure about yourself  1 0 0 0 0  Trouble concentrating 1 0 1 0 0  Moving slowly or fidgety/restless 0 0 0 0 0  Suicidal thoughts 1 0 0 0 0  PHQ-9 Score '7 3 5 ' 0 0  Difficult doing work/chores Somewhat difficult Not  difficult at all Not difficult at all Not difficult at all Not difficult at all    Quality of Life:  Quality of Life - 04/16/20 1616      Quality of Life   Select Quality of Life      Quality of Life Scores   Health/Function Pre 21.27 %    Health/Function Post 20.8 %    Health/Function % Change -2.21 %    Socioeconomic Pre 19.36 %    Socioeconomic Post 22.56 %    Socioeconomic % Change  16.53 %    Psych/Spiritual Pre 16.79 %    Psych/Spiritual Post 16.79 %    Psych/Spiritual % Change 0 %    Family Pre 24.9 %    Family Post 22.6 %    Family % Change -9.24 %    GLOBAL Pre 20.49 %    GLOBAL Post 20.66 %    GLOBAL % Change 0.83 %           Nutrition & Weight - Outcomes:  Pre Biometrics - 01/03/20 1027      Pre Biometrics   Height 5' 10.75" (1.797 m)    Weight 253 lb 11.2 oz (115.1 kg)    BMI (Calculated) 35.64    Single Leg Stand 2.44 seconds            Nutrition:  Nutrition Therapy & Goals - 01/22/20 0755      Nutrition Therapy   Diet Heart healthy, low Na, T2DM    Drug/Food Interactions Food/Disease   Insulin - Levemir and Novolog   Protein (specify units) 95-100g    Fiber 30 grams    Whole Grain Foods 3 servings    Saturated Fats 12 max. grams  Fruits and Vegetables 5 servings/day    Sodium 1.5 grams      Personal Nutrition Goals   Nutrition Goal ST: utilize MyPlate to include more whole grains/ starchy vegetables he enjoys, use spreadable margarine vs stick margarine, continue with current chnages LT: continue with heart healthy changes and lower BG    Comments CHO 60g/meal - 200g/day. Trying to avoid starchy vegetables and grains. B: 2 pieces of whole wheat toast with yogurt L: tuna cottage cheese and avocado and salads or leftover D: chicken and vegetables (heart healthy cooking methods) - margarine (stick) - small amount. BG: ~150-170. Levemir and novolog, recently lowered insulin. One at dinner, not at breakfast and lunch. Discussed heart healthy  eating and T2DM friendly eating - discussed whole grains and MyPlate      Intervention Plan   Intervention Prescribe, educate and counsel regarding individualized specific dietary modifications aiming towards targeted core components such as weight, hypertension, lipid management, diabetes, heart failure and other comorbidities.;Nutrition handout(s) given to patient.    Expected Outcomes Short Term Goal: Understand basic principles of dietary content, such as calories, fat, sodium, cholesterol and nutrients.;Short Term Goal: A plan has been developed with personal nutrition goals set during dietitian appointment.;Long Term Goal: Adherence to prescribed nutrition plan.           Nutrition Discharge:  Nutrition Assessments - 04/16/20 1618      MEDFICTS Scores   Post Score 29           Education Questionnaire Score:  Knowledge Questionnaire Score - 04/16/20 1616      Knowledge Questionnaire Score   Post Score 25/26           Goals reviewed with patient; copy given to patient.

## 2020-04-16 NOTE — Patient Instructions (Signed)
Discharge Patient Instructions  Patient Details  Name: Jeffery West MRN: [id number] Date of Birth: 1956/06/13 Referring Provider:  Minna Merritts, MD   Number of Visits: 95  Reason for Discharge:  Patient reached a stable level of exercise. Patient independent in their exercise. Patient has met program and personal goals.  Smoking History:  Social History   Tobacco Use  Smoking Status Never Smoker  Smokeless Tobacco Never Used    Diagnosis:  S/P CABG x 2  Initial Exercise Prescription:  Initial Exercise Prescription - 01/03/20 1300      Date of Initial Exercise RX and Referring Provider   Date 01/03/20    Referring Provider [name], MD      Treadmill   MPH 1.9    Grade 0.5    Minutes 15    METs 2.59      REL-XR   Level 2    Speed 50    Minutes 15    METs 2.6      T5 Nustep   Level 2    SPM 80    Minutes 15    METs 2.6      Prescription Details   Frequency (times per week) 2    Duration Progress to 30 minutes of continuous aerobic without signs/symptoms of physical distress      Intensity   THRR 40-80% of Max Heartrate 103-139    Ratings of Perceived Exertion 11-13    Perceived Dyspnea 0-4      Progression   Progression Continue to progress workloads to maintain intensity without signs/symptoms of physical distress.      Resistance Training   Training Prescription Yes    Weight 3 lb    Reps 10-15           Discharge Exercise Prescription (Final Exercise Prescription Changes):  Exercise Prescription Changes - 04/16/20 1300      Response to Exercise   Blood Pressure (Admit) 122/72    Blood Pressure (Exercise) 142/82    Blood Pressure (Exit) 116/64    Heart Rate (Admit) 93 bpm    Heart Rate (Exercise) 99 bpm    Heart Rate (Exit) 80 bpm    Rating of Perceived Exertion (Exercise) 12    Symptoms none    Duration Continue with 30 min of aerobic exercise without signs/symptoms of physical distress.    Intensity THRR unchanged        Progression   Progression Continue to progress workloads to maintain intensity without signs/symptoms of physical distress.    Average METs 3.4      Resistance Training   Training Prescription Yes    Weight 4 lb    Reps 10-15      Interval Training   Interval Training No      NuStep   Level 4    Minutes 15    METs 3.7      Biostep-RELP   Level 4    Minutes 15    METs 3      Home Exercise Plan   Plans to continue exercise at Home (comment)   walking and staff videos   Frequency Add 2 additional days to program exercise sessions.    Initial Home Exercises Provided 02/05/20           Functional Capacity:  6 Minute Walk    Row Name 01/03/20 1315 04/02/20 1724       6 Minute Walk   Phase Initial Discharge    Distance 1175 feet  1215 feet    Distance % Change -- 3.4 %    Distance Feet Change -- 40 ft    Walk Time 6 minutes 6 minutes    # of Rest Breaks 0 0    MPH 2.22 2.3    METS 2.69 2.81    RPE 13 12    Perceived Dyspnea  0 0    VO2 Peak 9.42 9.86    Symptoms No No    Resting HR 67 bpm 75 bpm    Resting BP 128/66 132/68    Resting Oxygen Saturation  96 % 96 %    Exercise Oxygen Saturation  during 6 min walk 95 % 94 %    Max Ex. HR 91 bpm 95 bpm    Max Ex. BP 150/68 158/64    2 Minute Post BP 122/64 --           Quality of Life:  Quality of Life - 04/16/20 1616      Quality of Life   Select Quality of Life      Quality of Life Scores   Health/Function Pre 21.27 %    Health/Function Post 20.8 %    Health/Function % Change -2.21 %    Socioeconomic Pre 19.36 %    Socioeconomic Post 22.56 %    Socioeconomic % Change  16.53 %    Psych/Spiritual Pre 16.79 %    Psych/Spiritual Post 16.79 %    Psych/Spiritual % Change 0 %    Family Pre 24.9 %    Family Post 22.6 %    Family % Change -9.24 %    GLOBAL Pre 20.49 %    GLOBAL Post 20.66 %    GLOBAL % Change 0.83 %           Personal Goals: Goals established at orientation with interventions  provided to work toward goal.  Personal Goals and Risk Factors at Admission - 01/03/20 1045      Core Components/Risk Factors/Patient Goals on Admission    Weight Management Yes;Obesity;Weight Loss    Intervention Weight Management: Provide education and appropriate resources to help participant work on and attain dietary goals.;Weight Management: Develop a combined nutrition and exercise program designed to reach desired caloric intake, while maintaining appropriate intake of nutrient and fiber, sodium and fats, and appropriate energy expenditure required for the weight goal.;Weight Management/Obesity: Establish reasonable short term and long term weight goals.;Obesity: Provide education and appropriate resources to help participant work on and attain dietary goals.    Admit Weight 253 lb 11.2 oz (115.1 kg)    Goal Weight: Short Term 248 lb (112.5 kg)    Goal Weight: Long Term 243 lb (110.2 kg)    Expected Outcomes Short Term: Continue to assess and modify interventions until short term weight is achieved;Long Term: Adherence to nutrition and physical activity/exercise program aimed toward attainment of established weight goal;Weight Maintenance: Understanding of the daily nutrition guidelines, which includes 25-35% calories from fat, 7% or less cal from saturated fats, less than 225m cholesterol, less than 1.5gm of sodium, & 5 or more servings of fruits and vegetables daily;Weight Loss: Understanding of general recommendations for a balanced deficit meal plan, which promotes 1-2 lb weight loss per week and includes a negative energy balance of [phone] kcal/d;Understanding recommendations for meals to include 15-35% energy as protein, 25-35% energy from fat, 35-60% energy from carbohydrates, less than 2054mof dietary cholesterol, 20-35 gm of total fiber daily;Understanding of distribution of calorie intake throughout the  day with the consumption of 4-5 meals/snacks    Diabetes Yes    Intervention  Provide education about signs/symptoms and action to take for hypo/hyperglycemia.;Provide education about proper nutrition, including hydration, and aerobic/resistive exercise prescription along with prescribed medications to achieve blood glucose in normal ranges: Fasting glucose 65-99 mg/dL    Expected Outcomes Short Term: Participant verbalizes understanding of the signs/symptoms and immediate care of hyper/hypoglycemia, proper foot care and importance of medication, aerobic/resistive exercise and nutrition plan for blood glucose control.;Long Term: Attainment of HbA1C < 7%.    Hypertension Yes    Intervention Provide education on lifestyle modifcations including regular physical activity/exercise, weight management, moderate sodium restriction and increased consumption of fresh fruit, vegetables, and low fat dairy, alcohol moderation, and smoking cessation.;Monitor prescription use compliance.    Expected Outcomes Short Term: Continued assessment and intervention until BP is < 140/31m HG in hypertensive participants. < 130/880mHG in hypertensive participants with diabetes, heart failure or chronic kidney disease.;Long Term: Maintenance of blood pressure at goal levels.    Lipids Yes    Intervention Provide education and support for participant on nutrition & aerobic/resistive exercise along with prescribed medications to achieve LDL <704mHDL >31m20m  Expected Outcomes Short Term: Participant states understanding of desired cholesterol values and is compliant with medications prescribed. Participant is following exercise prescription and nutrition guidelines.;Long Term: Cholesterol controlled with medications as prescribed, with individualized exercise RX and with personalized nutrition plan. Value goals: LDL < 70mg73mL > 40 mg.            Personal Goals Discharge:  Goals and Risk Factor Review - 04/14/20 1625      Core Components/Risk Factors/Patient Goals Review   Personal Goals Review  Weight Management/Obesity;Hypertension;Lipids;Diabetes    Review [facility] to graduate. He is staying compliant with his medications and was able to lower his insulin. BPs have been stable at rehab. His sugars have been stable aside from a couple high ones, though he just changed medications. He recognized symptoms if he gets too high or too low. He is still aiming to lose weight and is working toward specific nutrition goals. He plans to move more at home after graduation.    Expected Outcomes Short: Graduate Long: Continue manage lifestyle risk factors at home and continue practicing [facility]:  Ex[place] Goals    [place] 01/03/20 1044             Exercise Goals   Increase Physical Activity Yes       Intervention Provide advice, education, support and counseling about physical activity/exercise needs.;Develop an individualized exercise prescription for aerobic and resistive training based on initial evaluation findings, risk stratification, comorbidities and participant's personal goals.       Expected Outcomes Short Term: Attend rehab on a regular basis to increase amount of physical activity.;Long Term: Add in home exercise to make exercise part of routine and to increase amount of physical activity.;Long Term: Exercising regularly at least 3-5 days a week.       Increase Strength and Stamina Yes       Intervention Provide advice, education, support and counseling about physical activity/exercise needs.;Develop an individualized exercise prescription for aerobic and resistive training based on initial evaluation findings, risk stratification, comorbidities and participant's personal goals.       Expected Outcomes Short Term: Increase workloads from initial exercise prescription for  resistance, speed, and METs.;Short Term: Perform resistance training exercises routinely during rehab and add in resistance training at  home;Long Term: Improve cardiorespiratory fitness, muscular endurance and strength as measured by increased METs and functional capacity (6MWT)       Able to understand and use rate of perceived exertion (RPE) scale Yes       Intervention Provide education and explanation on how to use RPE scale       Expected Outcomes Short Term: Able to use RPE daily in rehab to express subjective intensity level;Long Term:  Able to use RPE to guide intensity level when exercising independently       Able to understand and use Dyspnea scale Yes       Intervention Provide education and explanation on how to use Dyspnea scale       Expected Outcomes Short Term: Able to use Dyspnea scale daily in rehab to express subjective sense of shortness of breath during exertion;Long Term: Able to use Dyspnea scale to guide intensity level when exercising independently       Knowledge and understanding of Target Heart Rate Range (THRR) Yes       Intervention Provide education and explanation of THRR including how the numbers were predicted and where they are located for reference       Expected Outcomes Short Term: Able to state/look up THRR;Short Term: Able to use daily as guideline for intensity in rehab;Long Term: Able to use THRR to govern intensity when exercising independently       Able to check pulse independently Yes       Intervention Provide education and demonstration on how to check pulse in carotid and radial arteries.;Review the importance of being able to check your own pulse for safety during independent exercise       Expected Outcomes Short Term: Able to explain why pulse checking is important during independent exercise;Long Term: Able to check pulse independently and accurately       Understanding of Exercise Prescription Yes       Intervention Provide education, explanation, and written materials on patient's individual exercise prescription       Expected Outcomes Short Term: Able to explain program  exercise prescription;Long Term: Able to explain home exercise prescription to exercise independently              Exercise Goals Re-Evaluation:  Exercise Goals Re-Evaluation    Row Name 01/21/20 1750 02/05/20 0819 02/18/20 1315 03/04/20 0828 03/17/20 1232     Exercise Goal Re-Evaluation   Exercise Goals Review Increase Physical Activity;Increase Strength and Stamina;Understanding of Exercise Prescription Increase Physical Activity;Increase Strength and Stamina;Understanding of Exercise Prescription Increase Physical Activity;Increase Strength and Stamina;Understanding of Exercise Prescription Increase Physical Activity;Increase Strength and Stamina;Understanding of Exercise Prescription Increase Physical Activity;Increase Strength and Stamina;Understanding of Exercise Prescription   Comments Jeffery West is tolerating exercise well in his first two weeks of exercise. Staff will monitor progress. Jeffery West is doing well in rehab.  He has already started to notice that things are getting easier. Reviewed home exercise with pt today.  Pt plans to walk and use staff videos at home for exercise.  Reviewed THR, pulse, RPE, sign and symptoms, pulse oximetery and when to call 911 or MD.  Also discussed weather considerations and indoor options.  Pt voiced understanding. Jeffery West attends consistently.  He works at [employer] quite reach THR range.  Staff will encourage him to increase speed or grade on TM and resistance on machines.  he has increased to 4 lb or strength work. Jeffery West has changed his schedule due to work and is now coming in the afternoons.  We will need to increase his levels on his new equipment.  We will continue to monitor his progress. --   Expected Outcomes Short:  continue to attend consistently Long:  build strength and stamina Short: Start to add in more walking at home again  Long: Continue to build stamina. Short: increase levels to rach THR range Long: increase stamina Short: Increase NS and BS  (new station) Long: Continue to improve stamina. --   Row Name 03/17/20 1233 03/17/20 1712 03/31/20 1625 04/14/20 1619       Exercise Goal Re-Evaluation   Exercise Goals Review Increase Physical Activity;Increase Strength and Stamina;Understanding of Exercise Prescription Increase Physical Activity;Increase Strength and Stamina;Understanding of Exercise Prescription Increase Physical Activity;Increase Strength and Stamina;Understanding of Exercise Prescription Increase Physical Activity;Increase Strength and Stamina;Understanding of Exercise Prescription    Comments Jeffery West has increased levels on T4 and T5 to level 3.  He has increased to 4 lb weights.  Staff will monitor progress. Jeffery West has been doing well in rehab. He stays active outside by doing yardwork. Sometimes walks with his wife for 20 minute increments. Encouraged to maintain the planned and continuous exercise for at least 30 minutes. He does take his pulse sometimes but not consistent with it. Does not use RPE at home, talked about importance of taking HR, using RPE, and maintaining THR level. Jeffery West is thinking of going to the [employer] after he graduates from [employer]. Jeffery West is doing well in rehab.  He is nearing his post 6MWT and we expect to see an improvement.  He is planning to continue to walk after graduation.  We will continue to monitor his progress. Jeffery West is getting ready to graduate from [employer]. He improved his 6MWT by 3.4%. After HT, he plans to walk at home, as he cannot afford a gym rmembmership right now. He also has weights that he uses at home.  He plans to attend a gym facility once he gets his finances in order.    Expected Outcomes Short: continue to attend consistently Long: increase overall MET level Short: Use RPE and THR at home. Look into [employer] details and graduate Long: Able to exercise independently at home Short: Improve post 6MWT Long: Continue to improve stamina. Short: Graduate Long: Continue to exercise at home  independently and continuous           Nutrition & Weight - Outcomes:  Pre Biometrics - 01/03/20 1027      Pre Biometrics   Height 5' 10.75" (1.797 m)    Weight 253 lb 11.2 oz (115.1 kg)    BMI (Calculated) 35.64    Single Leg Stand 2.44 seconds            Nutrition:  Nutrition Therapy & Goals - 01/22/20 0755      Nutrition Therapy   Diet Heart healthy, low Na, T2DM    Drug/Food Interactions Food/Disease   Insulin - Levemir and Novolog   Protein (specify units) 95-100g    Fiber 30 grams    Whole Grain Foods 3 servings    Saturated Fats 12 max. grams    Fruits and Vegetables 5 servings/day    Sodium 1.5 grams      Personal Nutrition Goals   Nutrition Goal ST: utilize MyPlate to include more whole grains/ starchy vegetables he enjoys, use spreadable margarine vs stick margarine, continue with current  chnages LT: continue with heart healthy changes and lower BG    Comments CHO 60g/meal - 200g/day. Trying to avoid starchy vegetables and grains. B: 2 pieces of whole wheat toast with yogurt L: tuna cottage cheese and avocado and salads or leftover D: chicken and vegetables (heart healthy cooking methods) - margarine (stick) - small amount. BG: ~150-170. Levemir and novolog, recently lowered insulin. One at dinner, not at breakfast and lunch. Discussed heart healthy eating and T2DM friendly eating - discussed whole grains and MyPlate      Intervention Plan   Intervention Prescribe, educate and counsel regarding individualized specific dietary modifications aiming towards targeted core components such as weight, hypertension, lipid management, diabetes, heart failure and other comorbidities.;Nutrition handout(s) given to patient.    Expected Outcomes Short Term Goal: Understand basic principles of dietary content, such as calories, fat, sodium, cholesterol and nutrients.;Short Term Goal: A plan has been developed with personal nutrition goals set during dietitian appointment.;Long Term  Goal: Adherence to prescribed nutrition plan.           Nutrition Discharge:  Nutrition Assessments - 04/16/20 1618      MEDFICTS Scores   Post Score 29           Education Questionnaire Score:  Knowledge Questionnaire Score - 04/16/20 1616      Knowledge Questionnaire Score   Post Score 25/26           Goals reviewed with patient; copy given to patient.

## 2020-04-30 ENCOUNTER — Other Ambulatory Visit: Payer: Self-pay | Admitting: Family Medicine

## 2020-04-30 DIAGNOSIS — E1169 Type 2 diabetes mellitus with other specified complication: Secondary | ICD-10-CM

## 2020-04-30 NOTE — Telephone Encounter (Signed)
Requested medication (s) are due for refill today - yes  Requested medication (s) are on the active medication list -yes  Future visit scheduled -yes  Last refill: 09/15/19  Notes to clinic: Rx directions changed at last OV- sent for review of sig  Requested Prescriptions  Pending Prescriptions Disp Refills   TRESIBA FLEXTOUCH 100 UNIT/ML FlexTouch Pen [Pharmacy Med Name: TRESIBA FLEXTOUCH 100 UNIT/ML]  14    Sig: INJECT 30 [place] AT 10 PM.      Endocrinology:  Diabetes - Insulins Failed - 04/30/2020  3:58 PM      Failed - HBA1C is between 0 and 7.9 and within 180 days    Hemoglobin A1C  Date Value Ref Range Status  02/26/2020 8.2 (A) 4.0 - 5.6 % Final   Hgb A1c MFr Bld  Date Value Ref Range Status  11/22/2019 11.4 (H) 4.8 - 5.6 % Final    Comment:    (NOTE) Pre diabetes:          5.7%-6.4% Diabetes:              >6.4% Glycemic control for   <7.0% adults with diabetes           Passed - Valid encounter within last 6 months    Recent Outpatient Visits           4 weeks ago Poorly controlled type 2 diabetes mellitus ([place])   [facility] Jerrol Banana., MD   2 months ago Type 2 diabetes mellitus with other specified complication, without long-term current use of insulin [facility])   [facility] Jerrol Banana[facility] MD   3 months ago Controlled type 2 diabetes mellitus without complication, without long-term current use of insulin [facility])   [facility] Jerrol Banana., MD   4 months ago Controlled type 2 diabetes mellitus without complication, without long-term current use of insulin [facility])   [facility] Jerrol Banana., MD   9 months ago Annual physical exam   [facility] Jerrol Banana., MD       Future Appointments             In 1 week Gollan, [name], MD [facility]   In 1 month Jerrol Banana., MD [facility]                Requested Prescriptions  Pending Prescriptions Disp Refills   TRESIBA FLEXTOUCH 100 UNIT/ML FlexTouch Pen [Pharmacy Med Name: TRESIBA FLEXTOUCH 100 UNIT/ML]  14    Sig: INJECT 30 UNITS INTO THE SKIN DAILY AT 10 PM.      Endocrinology:  Diabetes - Insulins Failed - 04/30/2020  3:58 PM      Failed - HBA1C is between 0 and 7.9 and within 180 days    Hemoglobin A1C  Date Value Ref Range Status  02/26/2020 8.2 (A) 4.0 - 5.6 % Final   Hgb A1c MFr Bld  Date Value Ref Range Status  11/22/2019 11.4 (H) 4.8 - 5.6 % Final    Comment:    (NOTE) Pre diabetes:          5.7%-6.4% Diabetes:              >6.4% Glycemic control for   <7.0% adults with diabetes           Passed - Valid encounter within last 6 months  Recent Outpatient Visits           4 weeks ago Poorly controlled type 2 diabetes mellitus [facility])   [facility] Jerrol Banana., MD   2 months ago Type 2 diabetes mellitus with other specified complication, without long-term current use of insulin [facility])   [facility] Jerrol Banana., MD   3 months ago Controlled type 2 diabetes mellitus without complication, without long-term current use of insulin [facility])   [facility] Jerrol Banana., MD   4 months ago Controlled type 2 diabetes mellitus without complication, without long-term current use of insulin [facility])   [facility] Jerrol Banana., MD   9 months ago Annual physical exam   [facility] Jerrol Banana., MD       Future Appointments             In 1 week Gollan, [name], MD [facility], [facility]   In 1 month Jerrol Banana., MD [facility] [place]

## 2020-05-11 NOTE — Progress Notes (Signed)
Cardiology Office Note  Date:  05/13/2020   ID:  Jeffery West, DOB Oct 31, 1955, MRN [id number]  PCP:  Jerrol Banana., MD   Chief Complaint  Patient presents with  . Other    3 month follow up. Meds reviewed verbally with patient.     HPI:  Jeffery West is a 64 year old gentleman with past medical history of Diabetes   elevated hemoglobin A1c 8-9 Smoker, remote HTN Hyperlipidemia Morbid obesity  aortic valve stenosis  on echocardiogram June 2017 Mild to Moderate aortic valve stenosis Who presents for follow-up of his coronary disease, recent aortic valve replacement with CABG x2  Cardiac work-up as below  Echo 09/28/2019 showed increase in aortic valve mean gradient of 53.5 mmHg, LVEF 60 to 68%, grade 1 diastolic dysfunction.    Cardiac catheterization 10/19/2019 showed 80% stenosis to mid LAD, 70% first diagonal stenosis.  Underwent coronary bypass graft surgery and bioprosthetic aortic valve replacement AVR (53mm Edwards Inspiris Resilia pericardial valve) and CABGx2 (LIMA-LAD, SVG-first diagonal) 11/26/2019.  Completed cardiac rehab Currently no regular exercise program Recent A1c still running above 8 Followed by endocrinology Reports he has slipped on his diet, weight was lower, now trending back up   Denies any chest pain or significant shortness of breath on exertion  EKG personally reviewed by myself on todays visit Shows normal sinus rhythm rate 78 bpm no significant ST or T-wave changes   PMH:   has a past medical history of Coronary artery disease, Diabetes mellitus without complication ([place]), Erectile dysfunction, Gout, Heart murmur, Hyperlipidemia, Hypertension, Obesity, Osteoarthritis, and Vitamin D deficiency.  PSH:    Past Surgical History:  Procedure Laterality Date  . AORTIC VALVE REPLACEMENT N/A 11/26/2019   Procedure: AORTIC VALVE REPLACEMENT (AVR) USING INSPIRIS AORTIC VALVE SIZE 23MM;  Surgeon: Gaye Pollack, MD;  Location: [place];  Service: Open  Heart Surgery;  Laterality: N/A;  . CARDIAC CATHETERIZATION    . COLONOSCOPY WITH PROPOFOL N/A 08/24/2019   Procedure: COLONOSCOPY WITH PROPOFOL;  Surgeon: Lucilla Lame, MD;  Location: [facility] ENDOSCOPY;  Service: Endoscopy;  Laterality: N/A;  . CORONARY ARTERY BYPASS GRAFT N/A 11/26/2019   Procedure: CORONARY ARTERY BYPASS GRAFTING (CABG) X Two , using left internal mammary artery and right leg greater saphenous vein harvested endoscopically.;  Surgeon: Gaye Pollack, MD;  Location: [place] OR;  Service: Open Heart Surgery;  Laterality: N/A;  . LEFT HEART CATH AND CORONARY ANGIOGRAPHY Left 10/19/2019   Procedure: LEFT HEART CATH AND CORONARY ANGIOGRAPHY;  Surgeon: Minna Merritts, MD;  Location: [place] CV LAB;  Service: Cardiovascular;  Laterality: Left;  . NO PAST SURGERIES    . RIGHT HEART CATH N/A 10/19/2019   Procedure: RIGHT HEART CATH;  Surgeon: Minna Merritts, MD;  Location: [place] CV LAB;  Service: Cardiovascular;  Laterality: N/A;  . SHOULDER ARTHROSCOPY WITH OPEN ROTATOR CUFF REPAIR Right 07/26/2017   Procedure: SHOULDER ARTHROSCOPY WITH OPEN ROTATOR CUFF REPAIR,SUBACROMINAL DECOMPRESSION,DISTAL CLAVICLE EXCISION;  Surgeon: Thornton Park, MD;  Location: [facility] ORS;  Service: Orthopedics;  Laterality: Right;  . TEE WITHOUT CARDIOVERSION N/A 11/26/2019   Procedure: TRANSESOPHAGEAL ECHOCARDIOGRAM (TEE);  Surgeon: Gaye Pollack, MD;  Location: [place];  Service: Open Heart Surgery;  Laterality: N/A;    Current Outpatient Medications  Medication Sig Dispense Refill  . acetaminophen (TYLENOL) 500 MG tablet Take 1,000 mg by mouth every 6 (six) hours as needed for moderate pain or headache.    . albuterol (PROAIR HFA) 108 (90 Base) MCG/ACT inhaler  Inhale 1 puff into the lungs every 6 (six) hours as needed for wheezing or shortness of breath. 18 g 3  . allopurinol (ZYLOPRIM) 300 MG tablet Take 1 tablet (300 mg total) by mouth daily. 90 tablet 3  . aspirin EC 81 MG tablet Take 81 mg by  mouth daily. Swallow whole.    . BD PEN NEEDLE NANO 2ND GEN 32G X 4 MM MISC USE AS DIRECTED 100 each 8  . Cholecalciferol (VITAMIN D) 2000 units CAPS Take 2,000 Units by mouth every evening.     . Dulaglutide (TRULICITY) 5.42 HC/6.2BJ SOPN Inject 0.75 mg into the skin once a week. 0.5 mL 12  . ezetimibe (ZETIA) 10 MG tablet TAKE 1 TABLET BY MOUTH EVERY DAY 90 tablet 3  . glucose blood (ONETOUCH VERIO) test strip Use as instructed; BID 100 each 12  . insulin aspart (NOVOLOG) 100 UNIT/ML injection Inject 6 Units into the skin every evening. 10 mL 1  . insulin degludec (TRESIBA) 100 UNIT/ML FlexTouch Pen INJECT 25 UNITS INTO THE SKIN DAILY AT 10 PM.    . Insulin Syringe-Needle U-100 30G X 1/2" 1 ML MISC Use with insulin 3 times daily 270 each 3  . losartan (COZAAR) 50 MG tablet Take 50 mg by mouth daily.    . magnesium oxide (MAG-OX) 400 MG tablet Take 400 mg by mouth 2 (two) times daily.     . metFORMIN (GLUCOPHAGE) 1000 MG tablet Take 1 tablet (1,000 mg total) by mouth 2 (two) times daily with a meal. 180 tablet 0  . metoprolol tartrate (LOPRESSOR) 25 MG tablet Take 1 tablet (25 mg total) by mouth 2 (two) times daily. 180 tablet 3  . nystatin cream (MYCOSTATIN) Apply 1 application topically 2 (two) times daily. 30 g 6  . OneTouch Delica Lancets 62G MISC Use 3 times a day 100 each 12  . tamsulosin (FLOMAX) 0.4 MG CAPS capsule TAKE 1 CAPSULE BY MOUTH EVERY DAY 90 capsule 3  . rosuvastatin (CRESTOR) 5 MG tablet Take 1 tablet (5 mg total) by mouth daily. 90 tablet 3   No current facility-administered medications for this visit.     Allergies:   Penicillins   Social History:  The patient  reports that he has never smoked. He has never used smokeless tobacco. He reports current alcohol use. He reports that he does not use drugs.   Family History:   family history includes Asthma in his mother; Cancer in his father; Hypertension in his brother.    Review of Systems: Review of Systems   Constitutional: Negative.   HENT: Negative.   Respiratory: Negative.   Cardiovascular: Negative.   Gastrointestinal: Negative.   Musculoskeletal: Negative.   Neurological: Negative.   Psychiatric/Behavioral: Negative.   All other systems reviewed and are negative.   PHYSICAL EXAM: VS:  BP 114/64 (BP Location: Left Arm, Patient Position: Sitting, Cuff Size: Normal)   Pulse 78   Ht 5\' 11"  (1.803 m)   Wt 256 lb (116.1 kg)   SpO2 96%   BMI 35.70 kg/m  , BMI Body mass index is 35.7 kg/m. GEN: Well nourished, well developed, in no acute distress HEENT: normal Neck: no JVD, carotid bruits, or masses Cardiac: RRR; no murmurs, rubs, or gallops,no edema  Respiratory:  clear to auscultation bilaterally, normal work of breathing GI: soft, nontender, nondistended, + BS MS: no deformity or atrophy Well-healed sternal incision Skin: warm and dry, no rash Neuro:  Strength and sensation are intact Psych: euthymic mood, full  affect   Recent Labs: 07/20/2019: TSH 1.970 11/27/2019: Magnesium 2.2 11/28/2019: Hemoglobin 12.2; Platelets 96 12/18/2019: ALT 34; BUN 13; Creatinine, Ser 0.97; Potassium 4.6; Sodium 140    Lipid Panel Lab Results  Component Value Date   CHOL 108 12/18/2019   HDL 35 (L) 12/18/2019   LDLCALC 53 12/18/2019   TRIG 110 12/18/2019      Wt Readings from Last 3 Encounters:  05/13/20 256 lb (116.1 kg)  04/01/20 257 lb (116.6 kg)  02/26/20 256 lb (116.1 kg)       ASSESSMENT AND PLAN:  Problem List Items Addressed This Visit      Cardiology Problems   Aortic valve stenosis   Relevant Medications   aspirin EC 81 MG tablet     Other   S/P AVR   Poorly controlled type 2 diabetes mellitus (HCC)   Relevant Medications   aspirin EC 81 MG tablet   insulin degludec (TRESIBA) 100 UNIT/ML FlexTouch Pen    Other Visit Diagnoses    Coronary artery disease of native artery of native heart with stable angina pectoris (HCC)    -  Primary   Relevant Medications    aspirin EC 81 MG tablet   Hyperlipidemia LDL goal <70       Relevant Medications   aspirin EC 81 MG tablet   Essential hypertension       Relevant Medications   aspirin EC 81 MG tablet     CAD with chronic stable angina Recent CABG x2 Recovered well, completed cardiac rehab Borderline low blood pressure, no medication changes at this time Recommend he restart his walking program, stressed importance of diet modification and diabetes control Aim for hemoglobin A1c in the 6 range  Diabetes type 2 with complications Stressed importance of follow-up with endocrinology, diet modification, walking program Hemoglobin A1c goal less than 7 discussed with him Recommend he talk with primary care whether he would be a candidate for Ozempic given the cardiovascular benefits  Aortic valve stenosis, status post AVR Recent bioprosthetic valve placement We will schedule echocardiogram 2022  Hyperlipidemia Cholesterol is at goal on the current lipid regimen. No changes to the medications were made.     Total encounter time more than 25 minutes  Greater than 50% was spent in counseling and coordination of care with the patient    Signed, Esmond Plants, M.D., Ph.D. [place], [place]

## 2020-05-13 ENCOUNTER — Encounter: Payer: Self-pay | Admitting: Cardiovascular Disease

## 2020-05-13 ENCOUNTER — Ambulatory Visit (INDEPENDENT_AMBULATORY_CARE_PROVIDER_SITE_OTHER): Payer: No Typology Code available for payment source | Admitting: Cardiovascular Disease

## 2020-05-13 ENCOUNTER — Other Ambulatory Visit: Payer: Self-pay

## 2020-05-13 VITALS — BP 114/64 | HR 78 | Ht 71.0 in | Wt 256.0 lb

## 2020-05-13 DIAGNOSIS — I25118 Atherosclerotic heart disease of native coronary artery with other forms of angina pectoris: Secondary | ICD-10-CM

## 2020-05-13 DIAGNOSIS — Z952 Presence of prosthetic heart valve: Secondary | ICD-10-CM | POA: Diagnosis not present

## 2020-05-13 DIAGNOSIS — I350 Nonrheumatic aortic (valve) stenosis: Secondary | ICD-10-CM

## 2020-05-13 DIAGNOSIS — I10 Essential (primary) hypertension: Secondary | ICD-10-CM

## 2020-05-13 DIAGNOSIS — E785 Hyperlipidemia, unspecified: Secondary | ICD-10-CM

## 2020-05-13 DIAGNOSIS — E1165 Type 2 diabetes mellitus with hyperglycemia: Secondary | ICD-10-CM

## 2020-05-13 DIAGNOSIS — I060 Rheumatic aortic stenosis: Secondary | ICD-10-CM

## 2020-05-13 MED ORDER — SILDENAFIL CITRATE 20 MG PO TABS
20.0000 mg | ORAL_TABLET | Freq: Three times a day (TID) | ORAL | 3 refills | Status: DC | PRN
Start: 2020-05-13 — End: 2020-11-12

## 2020-05-13 MED ORDER — SILDENAFIL CITRATE 20 MG PO TABS: 20.0000 mg | ORAL_TABLET | Freq: Three times a day (TID) | ORAL | 3 refills | Status: DC | PRN

## 2020-05-13 NOTE — Patient Instructions (Addendum)
Ask Dr. Rosanna Randy about about Ozempic  Medication Instructions:  No changes  If you need a refill on your cardiac medications before your next appointment, please call your pharmacy.    Lab work: No new labs needed   If you have labs (blood work) drawn today and your tests are completely normal, you will receive your results only by: Marland Kitchen MyChart Message (if you have MyChart) OR . A paper copy in the mail If you have any lab test that is abnormal or we need to change your treatment, we will call you to review the results.   Testing/Procedures: No new testing needed   Follow-Up: At [facility], you and your health needs are our priority.  As part of our continuing mission to provide you with exceptional heart care, we have created designated Provider Care Teams.  These Care Teams include your primary Cardiologist (physician) and Advanced Practice Providers (APPs -  Physician Assistants and Nurse Practitioners) who all work together to provide you with the care you need, when you need it.  . You will need a follow up appointment in 6 months  . Providers on your designated Care Team:   . Murray Hodgkins, NP . Christell Faith, PA-C . Marrianne Mood, PA-C  Any Other Special Instructions Will Be Listed Below (If Applicable).  COVID-19 Vaccine Information can be found at: [email or web address] For questions related to vaccine distribution or appointments, please email [email or web address] or call [phone].

## 2020-05-18 ENCOUNTER — Other Ambulatory Visit: Payer: Self-pay | Admitting: Family Medicine

## 2020-05-18 DIAGNOSIS — E119 Type 2 diabetes mellitus without complications: Secondary | ICD-10-CM

## 2020-05-18 NOTE — Telephone Encounter (Signed)
Requested Prescriptions  Pending Prescriptions Disp Refills   metFORMIN (GLUCOPHAGE) 1000 MG tablet [Pharmacy Med Name: METFORMIN HCL 1,000 MG TABLET] 60 tablet 0    Sig: TAKE 1 TABLET (1,000 MG TOTAL) BY MOUTH 2 (TWO) TIMES DAILY WITH A MEAL.     Endocrinology:  Diabetes - Biguanides Failed - 05/18/2020  8:33 AM      Failed - HBA1C is between 0 and 7.9 and within 180 days    Hemoglobin A1C  Date Value Ref Range Status  02/26/2020 8.2 (A) 4.0 - 5.6 % Final   Hgb A1c MFr Bld  Date Value Ref Range Status  11/22/2019 11.4 (H) 4.8 - 5.6 % Final    Comment:    (NOTE) Pre diabetes:          5.7%-6.4% Diabetes:              >6.4% Glycemic control for   <7.0% adults with diabetes          Passed - Cr in normal range and within 360 days    Creat  Date Value Ref Range Status  05/23/2017 0.77 0.70 - 1.25 mg/dL Final    Comment:    For patients >86 years of age, the reference limit for Creatinine is approximately 13% higher for people identified as African-American. .    Creatinine, Ser  Date Value Ref Range Status  12/18/2019 0.97 0.76 - 1.27 mg/dL Final         Passed - eGFR in normal range and within 360 days    GFR, Est African American  Date Value Ref Range Status  05/23/2017 114 > OR = 60 mL/min/1.60m Final   GFR calc Af Amer  Date Value Ref Range Status  12/18/2019 96 >59 mL/min/1.73 Final    Comment:    **[facility] currently reports eGFR in compliance with the current**   recommendations of the [employer] [facility] will   update reporting as new guidelines are published from the NKF-ASN   Task force.    GFR, Est Non African American  Date Value Ref Range Status  05/23/2017 99 > OR = 60 mL/min/1.733mFinal   GFR calc non Af Amer  Date Value Ref Range Status  12/18/2019 83 >59 mL/min/1.73 Final         Passed - Valid encounter within last 6 months    Recent Outpatient Visits          1 month ago Poorly controlled type 2 diabetes mellitus  [facility]   2 months ago Type 2 diabetes mellitus with other specified complication, without long-term current use of insulin ([facility]  [facility]   4 months ago Controlled type 2 diabetes mellitus without complication, without long-term current use of insulin ([facility]  [facility]   5 months ago Controlled type 2 diabetes mellitus without complication, without long-term current use of insulin ([facility]  [facility]   10 months ago Annual physical exam   [facility]      Future Appointments            In 3 weeks GiJerrol Banana MD [facility]

## 2020-05-26 ENCOUNTER — Other Ambulatory Visit: Payer: Self-pay | Admitting: Family Medicine

## 2020-05-26 DIAGNOSIS — E119 Type 2 diabetes mellitus without complications: Secondary | ICD-10-CM

## 2020-06-09 ENCOUNTER — Ambulatory Visit: Payer: No Typology Code available for payment source | Admitting: Family Medicine

## 2020-06-09 NOTE — Progress Notes (Signed)
Established patient visit   Patient: Jeffery West   DOB: [birth year]   64 y.o. Male  MRN: [id number] Visit Date: 06/10/2020  Today's healthcare provider: [name], MD   Chief Complaint  Patient presents with  . Diabetes  . Hypertension  . Back Pain   Subjective    HPI  Patient feels well but is not working on diet and exercise as he needs to.  Is aware of this. Planes of low back stiffness in the morning for the past 6 weeks or so.  No known injury. Diabetes Mellitus Type II, follow-up  Lab Results  Component Value Date   HGBA1C 7.8 (A) 06/10/2020   HGBA1C 8.2 (A) 02/26/2020   HGBA1C 11.4 (H) 11/22/2019   Last seen for diabetes 3 months ago.  Management since then includes; Started Trulicity weekly and decrease Tresiba from 30 units to 25 units daily.  He reports fair compliance with treatment. He is not having side effects.   Home blood sugar records: fasting range: 130s  Episodes of hypoglycemia? No    Current insulin regiment: novolog in the evenings  Most Recent Eye Exam: 01/2020   Hypertension, follow-up  BP Readings from Last 3 Encounters:  06/10/20 120/65  05/13/20 114/64  04/01/20 117/69   Wt Readings from Last 3 Encounters:  06/10/20 260 lb (117.9 kg)  05/13/20 256 lb (116.1 kg)  04/01/20 257 lb (116.6 kg)     He was last seen for hypertension 3 months ago.  BP at that visit was 110/67. Management since that visit includes; on losartan and metoprolol. He reports good compliance with treatment. He is not having side effects.  He is not exercising. He is adherent to low salt diet.   Outside blood pressures are not being checked.  He does not smoke.  Use of agents associated with hypertension: none.        Medications: Outpatient Medications Prior to Visit  Medication Sig  . acetaminophen (TYLENOL) 500 MG tablet Take 1,000 mg by mouth every 6 (six) hours as needed for moderate pain or headache.  . albuterol (PROAIR HFA) 108  (90 Base) MCG/ACT inhaler Inhale 1 puff into the lungs every 6 (six) hours as needed for wheezing or shortness of breath.  . allopurinol (ZYLOPRIM) 300 MG tablet Take 1 tablet (300 mg total) by mouth daily.  Marland Kitchen aspirin EC 81 MG tablet Take 81 mg by mouth daily. Swallow whole.  . BD PEN NEEDLE NANO 2ND GEN 32G X 4 MM MISC USE AS DIRECTED  . Cholecalciferol (VITAMIN D) 2000 units CAPS Take 2,000 Units by mouth every evening.   . Dulaglutide (TRULICITY) 5.45 GY/5.6LS SOPN Inject 0.75 mg into the skin once a week.  . ezetimibe (ZETIA) 10 MG tablet TAKE 1 TABLET BY MOUTH EVERY DAY  . glucose blood (ONETOUCH VERIO) test strip Use as instructed; BID  . insulin aspart (NOVOLOG) 100 UNIT/ML injection Inject 6 Units into the skin every evening.  . insulin degludec (TRESIBA) 100 UNIT/ML FlexTouch Pen INJECT 25 UNITS INTO THE SKIN DAILY AT 10 PM.  . Insulin Syringe-Needle U-100 30G X 1/2" 1 ML MISC Use with insulin 3 times daily  . losartan (COZAAR) 50 MG tablet Take 50 mg by mouth daily.  . magnesium oxide (MAG-OX) 400 MG tablet Take 400 mg by mouth 2 (two) times daily.   . metFORMIN (GLUCOPHAGE) 1000 MG tablet TAKE 1 TABLET (1,000 MG TOTAL) BY MOUTH 2 (TWO) TIMES DAILY WITH A MEAL.  Marland Kitchen  metoprolol tartrate (LOPRESSOR) 25 MG tablet Take 1 tablet (25 mg total) by mouth 2 (two) times daily.  Marland Kitchen nystatin cream (MYCOSTATIN) Apply 1 application topically 2 (two) times daily.  [name] Delica Lancets 35H MISC Use 3 times a day  . sildenafil (REVATIO) 20 MG tablet Take 1 tablet (20 mg total) by mouth 3 (three) times daily as needed.  . tamsulosin (FLOMAX) 0.4 MG CAPS capsule TAKE 1 CAPSULE BY MOUTH EVERY DAY  . rosuvastatin (CRESTOR) 5 MG tablet Take 1 tablet (5 mg total) by mouth daily.   No facility-administered medications prior to visit.    Review of Systems  Constitutional: Negative for appetite change, chills and fever.  Respiratory: Negative for chest tightness, shortness of breath and wheezing.    Cardiovascular: Negative for chest pain and palpitations.  Gastrointestinal: Negative for abdominal pain, nausea and vomiting.    Last hemoglobin A1c Lab Results  Component Value Date   HGBA1C 7.8 (A) 06/10/2020      Objective    BP 120/65   Pulse 74   Temp 98.5 F (36.9 C)   Resp 16   Ht 5\' 11"  (1.803 m)   Wt 260 lb (117.9 kg)   BMI 36.26 kg/m  BP Readings from Last 3 Encounters:  06/10/20 120/65  05/13/20 114/64  04/01/20 117/69   Wt Readings from Last 3 Encounters:  06/10/20 260 lb (117.9 kg)  05/13/20 256 lb (116.1 kg)  04/01/20 257 lb (116.6 kg)      Physical Exam Vitals reviewed.  Constitutional:      Appearance: He is obese.  HENT:     Head: Normocephalic and atraumatic.  Eyes:     General: No scleral icterus.    Conjunctiva/sclera: Conjunctivae normal.  Cardiovascular:     Rate and Rhythm: Normal rate and regular rhythm.     Pulses: Normal pulses.     Heart sounds: Normal heart sounds.  Pulmonary:     Effort: Pulmonary effort is normal.     Breath sounds: Normal breath sounds.  Abdominal:     Palpations: Abdomen is soft.  Musculoskeletal:     Right lower leg: No edema.     Left lower leg: No edema.     Comments: He has full range of motion in his lower back.  Lymphadenopathy:     Cervical: No cervical adenopathy.  Skin:    General: Skin is warm and dry.  Neurological:     General: No focal deficit present.     Mental Status: He is alert and oriented to person, place, and time.  Psychiatric:        Mood and Affect: Mood normal.        Behavior: Behavior normal.        Thought Content: Thought content normal.       Results for orders placed or performed in visit on 06/10/20  POCT glycosylated hemoglobin (Hb A1C)  Result Value Ref Range   Hemoglobin A1C 7.8 (A) 4.0 - 5.6 %   HbA1c POC (<> result, manual entry)     HbA1c, POC (prediabetic range)     HbA1c, POC (controlled diabetic range)      Assessment & Plan     1. Poorly  controlled type 2 diabetes mellitus ([place]) Continue present management patient strongly advised to work on diet and exercise.  Once he is 7.8 today which is improving. - POCT glycosylated hemoglobin (Hb A1C)  2. Primary hypertension   3. Acute low back pain, unspecified  back pain laterality, unspecified whether sciatica present Flexeril at bedtime.  Consider physical therapy.  No indication for imaging at this time - cyclobenzaprine (FLEXERIL) 10 MG tablet; Take 1 tablet (10 mg total) by mouth 3 (three) times daily.  Dispense: 30 tablet; Refill: 1  4. Morbid obesity ([place]) This patient's major problem.  He has been unable or unwilling to work on this through the years.  5. S/P CABG (coronary artery bypass graft) All risk factors treated.  6. S/P AVR (aortic valve replacement)    No follow-ups on file.         Sanyia Dini Cranford Mon, MD  [facility] [phone] (phone) [phone] (fax)  [place]

## 2020-06-10 ENCOUNTER — Other Ambulatory Visit: Payer: Self-pay

## 2020-06-10 ENCOUNTER — Encounter: Payer: Self-pay | Admitting: Family Medicine

## 2020-06-10 ENCOUNTER — Ambulatory Visit (INDEPENDENT_AMBULATORY_CARE_PROVIDER_SITE_OTHER): Payer: No Typology Code available for payment source | Admitting: Family Medicine

## 2020-06-10 VITALS — BP 120/65 | HR 74 | Temp 98.5°F | Resp 16 | Ht 71.0 in | Wt 260.0 lb

## 2020-06-10 DIAGNOSIS — I10 Essential (primary) hypertension: Secondary | ICD-10-CM

## 2020-06-10 DIAGNOSIS — Z952 Presence of prosthetic heart valve: Secondary | ICD-10-CM

## 2020-06-10 DIAGNOSIS — Z951 Presence of aortocoronary bypass graft: Secondary | ICD-10-CM

## 2020-06-10 DIAGNOSIS — M545 Low back pain, unspecified: Secondary | ICD-10-CM | POA: Diagnosis not present

## 2020-06-10 DIAGNOSIS — E1165 Type 2 diabetes mellitus with hyperglycemia: Secondary | ICD-10-CM | POA: Diagnosis not present

## 2020-06-10 LAB — POCT GLYCOSYLATED HEMOGLOBIN (HGB A1C): Hemoglobin A1C: 7.8 % — AB (ref 4.0–5.6)

## 2020-06-10 MED ORDER — CYCLOBENZAPRINE HCL 10 MG PO TABS: 10.0000 mg | ORAL_TABLET | Freq: Three times a day (TID) | ORAL | 1 refills | Status: DC

## 2020-07-15 ENCOUNTER — Other Ambulatory Visit: Payer: Self-pay | Admitting: Family Medicine

## 2020-07-15 DIAGNOSIS — E119 Type 2 diabetes mellitus without complications: Secondary | ICD-10-CM

## 2020-07-15 NOTE — Telephone Encounter (Signed)
Notes to clinic:  Pharmacy comment: Alternative Requested:THE PRESCRIBED MEDICATION IS NOT COVERED BY INSURANCE. PLEASE CONSIDER CHANGING TO ONE OF THE SUGGESTED COVERED ALTERNATIVES OR SUBMITTING FOR A PRIOR AUTHORIZATION   Requested Prescriptions  Pending Prescriptions Disp Refills   BYDUREON 2 MG PEN [Pharmacy Med Name: BYDUREON 2 MG PEN INJECT]  0      Endocrinology:  Diabetes - GLP-1 Receptor Agonists - exenatide Passed - 07/15/2020  7:21 AM      Passed - HBA1C is between 0 and 7.9 and within 180 days    Hemoglobin A1C  Date Value Ref Range Status  06/10/2020 7.8 (A) 4.0 - 5.6 % Final   Hgb A1c MFr Bld  Date Value Ref Range Status  11/22/2019 11.4 (H) 4.8 - 5.6 % Final    Comment:    (NOTE) Pre diabetes:          5.7%-6.4% Diabetes:              >6.4% Glycemic control for   <7.0% adults with diabetes           Passed - Cr in normal range and within 360 days    Creat  Date Value Ref Range Status  05/23/2017 0.77 0.70 - 1.25 mg/dL Final    Comment:    For patients >74 years of age, the reference limit for Creatinine is approximately 13% higher for people identified as African-American. .    Creatinine, Ser  Date Value Ref Range Status  12/18/2019 0.97 0.76 - 1.27 mg/dL Final          Passed - eGFR in normal range and within 360 days    GFR, Est African American  Date Value Ref Range Status  05/23/2017 114 > OR = 60 mL/min/1.23m Final   GFR calc Af Amer  Date Value Ref Range Status  12/18/2019 96 >59 mL/min/1.73 Final    Comment:    **[facility] currently reports eGFR in compliance with the current**   recommendations of the [employer] [facility] will   update reporting as new guidelines are published from the NKF-ASN   Task force.    GFR, Est Non African American  Date Value Ref Range Status  05/23/2017 99 > OR = 60 mL/min/1.769mFinal   GFR calc non Af Amer  Date Value Ref Range Status  12/18/2019 83 >59 mL/min/1.73 Final           Passed - Valid encounter within last 6 months    Recent Outpatient Visits           1 month ago Poorly controlled type 2 [facility] ([facility]   3 months ago Poorly controlled type 2 diabetes mellitus ([facility]  [facility]   4 months ago Type 2 diabetes mellitus with other specified complication, without long-term current use of insulin ([facility]  [facility]   6 months ago Controlled type 2 diabetes mellitus without complication, without long-term current use of insulin ([facility]  [facility]   7 months ago Controlled type 2 diabetes mellitus without complication, without long-term current use of insulin ([facility]  [facility]       Future Appointments             In 2 months GiJerrol Banana MD [facility]

## 2020-07-17 ENCOUNTER — Telehealth: Payer: Self-pay | Admitting: Family Medicine

## 2020-07-17 ENCOUNTER — Other Ambulatory Visit: Payer: Self-pay | Admitting: Family Medicine

## 2020-07-17 DIAGNOSIS — E119 Type 2 diabetes mellitus without complications: Secondary | ICD-10-CM

## 2020-07-17 NOTE — Telephone Encounter (Signed)
[facility] called and spoke to [place], [facility] about the refill request of Trulicity 4.15 mg. I advised Exenatide 2 mg was sent in today because the note from the pharmacy noted Truilicity is not covered by insurance and change to the covered alternatives or submit for a PA. She says the Exenatide is now requiring a PA. She asks do I want to give her verbal for the Trulicity and see what it says when she runs it? I advised I will have to send to the provider for approval.

## 2020-07-22 NOTE — Telephone Encounter (Signed)
Patient called in to inform Dr Rosanna Randy that he is due for his injection of Trulicity on Thursday 0/3/00 and that the insurance is waiting on the prior authorization. Asking for a call back with an update ASAP and state if this does not get done will he be able to come to the office for a sample Ph# [phone]

## 2020-07-23 NOTE — Telephone Encounter (Signed)
Advised pt's wife Gwinda Passe that pt can come by the office for a sample of Trulicity. Also advised that PA's have been started for Bydureon and Trulicity. Gwinda Passe said insurance stated better coverage with patient taking metformin and Trulicity together.

## 2020-07-23 NOTE — Telephone Encounter (Signed)
PA request was received 07/22/2020 and started today. PA is for Bydureon.

## 2020-07-23 NOTE — Telephone Encounter (Signed)
Pt called again regarding this. He states that he is needing to have this taken care of today. Please advise.

## 2020-07-24 ENCOUNTER — Telehealth: Payer: Self-pay | Admitting: Family Medicine

## 2020-07-24 DIAGNOSIS — E119 Type 2 diabetes mellitus without complications: Secondary | ICD-10-CM

## 2020-07-24 NOTE — Telephone Encounter (Signed)
Patient called to inform the doctor that the samples of Trulicity that his wife picked up at the office is a larger dose than he usually takes.  He is not sure why and would like the nurse or doctor to call him to advise.  CB# [phone]

## 2020-07-25 ENCOUNTER — Other Ambulatory Visit: Payer: Self-pay | Admitting: Family Medicine

## 2020-07-25 DIAGNOSIS — E119 Type 2 diabetes mellitus without complications: Secondary | ICD-10-CM

## 2020-07-25 MED ORDER — TRULICITY 1.5 MG/0.5ML ~~LOC~~ SOAJ: 1.5000 mg | SUBCUTANEOUS | 4 refills | Status: DC

## 2020-07-25 NOTE — Telephone Encounter (Signed)
I want him on the larger dose.  1.5.

## 2020-07-25 NOTE — Telephone Encounter (Signed)
Wife called to advise the Trulicity 1.5  needs a prior authorization.  Pt does not want an alternate.  She wants to make sure this gets to [facility].  Pt been without his medication for over a week.  Thank goodness Dr Rosanna Randy gave him a sample yesterday.

## 2020-07-25 NOTE — Telephone Encounter (Signed)
Notes to clinic:   Alternative Requested:THE PRESCRIBED MEDICATION IS NOT COVERED BY INSURANCE. PLEASE CONSIDER CHANGING TO ONE OF THE SUGGESTED COVERED ALTERNATIVES.  C.  Requested Prescriptions  Pending Prescriptions Disp Refills   BYDUREON 2 MG PEN [Pharmacy Med Name: BYDUREON 2 MG PEN INJECT]  0      Endocrinology:  Diabetes - GLP-1 Receptor Agonists - exenatide Passed - 07/25/2020  2:44 PM      Passed - HBA1C is between 0 and 7.9 and within 180 days    Hemoglobin A1C  Date Value Ref Range Status  06/10/2020 7.8 (A) 4.0 - 5.6 % Final   Hgb A1c MFr Bld  Date Value Ref Range Status  11/22/2019 11.4 (H) 4.8 - 5.6 % Final    Comment:    (NOTE) Pre diabetes:          5.7%-6.4% Diabetes:              >6.4% Glycemic control for   <7.0% adults with diabetes           Passed - Cr in normal range and within 360 days    Creat  Date Value Ref Range Status  05/23/2017 0.77 0.70 - 1.25 mg/dL Final    Comment:    For patients >52 years of age, the reference limit for Creatinine is approximately 13% higher for people identified as African-American. .    Creatinine, Ser  Date Value Ref Range Status  12/18/2019 0.97 0.76 - 1.27 mg/dL Final          Passed - eGFR in normal range and within 360 days    GFR, Est African American  Date Value Ref Range Status  05/23/2017 114 > OR = 60 mL/min/1.19m Final   GFR calc Af Amer  Date Value Ref Range Status  12/18/2019 96 >59 mL/min/1.73 Final    Comment:    **[facility] currently reports eGFR in compliance with the current**   recommendations of the [employer] [facility] will   update reporting as new guidelines are published from the NKF-ASN   Task force.    GFR, Est Non African American  Date Value Ref Range Status  05/23/2017 99 > OR = 60 mL/min/1.758mFinal   GFR calc non Af Amer  Date Value Ref Range Status  12/18/2019 83 >59 mL/min/1.73 Final          Passed - Valid encounter within last 6 months     Recent Outpatient Visits           1 month ago Poorly controlled type 2 diabetes mellitus ([facility]   3 months ago Poorly controlled type 2 diabetes mellitus ([facility]  [facility]   5 months ago Type 2 diabetes mellitus with other specified complication, without long-term current use of insulin ([facility]  [facility]   6 months ago Controlled type 2 diabetes mellitus without complication, without long-term current use of insulin ([facility]  [facility]   7 months ago Controlled type 2 diabetes mellitus without complication, without long-term current use of insulin ([facility]  [facility]       Future Appointments             In 2 months GiJerrol Banana MD [facility]

## 2020-07-25 NOTE — Telephone Encounter (Signed)
Patient was given 1.5mg /0.79mL and he takes the 0.75mg /6.2UQ of trulicity. We do not have any more samples. Is it ok for the patient to take 1.5mg /0.36mL? Please advise. Thanks!

## 2020-07-25 NOTE — Telephone Encounter (Signed)
Please review. I think patient is going to use the Trulicity. It is covered.

## 2020-07-25 NOTE — Telephone Encounter (Signed)
Patient's wife Gwinda Passe was advised. Sent rx to pharmacy for 1.5 mg.

## 2020-07-28 NOTE — Telephone Encounter (Signed)
PA was started.

## 2020-08-04 ENCOUNTER — Other Ambulatory Visit: Payer: Self-pay | Admitting: Family Medicine

## 2020-08-04 DIAGNOSIS — E785 Hyperlipidemia, unspecified: Secondary | ICD-10-CM

## 2020-08-07 NOTE — Telephone Encounter (Signed)
Trulicity is covered per insurance.

## 2020-08-21 ENCOUNTER — Other Ambulatory Visit: Payer: Self-pay | Admitting: Family Medicine

## 2020-08-21 DIAGNOSIS — E119 Type 2 diabetes mellitus without complications: Secondary | ICD-10-CM

## 2020-09-21 ENCOUNTER — Other Ambulatory Visit: Payer: Self-pay | Admitting: Family Medicine

## 2020-09-21 NOTE — Telephone Encounter (Signed)
Requested medication (s) are due for refill today: yes  Requested medication (s) are on the active medication list: yes  Last refill:  10/08/19  Future visit scheduled: yes  Notes to clinic:  overdue lab work   Requested Prescriptions  Pending Prescriptions Disp Refills   allopurinol (ZYLOPRIM) 300 MG tablet [Pharmacy Med Name: ALLOPURINOL 300 MG TABLET] 90 tablet 3    Sig: TAKE 1 TABLET BY [place]      Endocrinology:  Gout Agents Failed - 09/21/2020  8:13 AM      Failed - Uric Acid in normal range and within 360 days    No results found for: POCURA, LABURIC        Passed - Cr in normal range and within 360 days    Creat  Date Value Ref Range Status  05/23/2017 0.77 0.70 - 1.25 mg/dL Final    Comment:    For patients >64 years of age, the reference limit for Creatinine is approximately 13% higher for people identified as African-American. .    Creatinine, Ser  Date Value Ref Range Status  12/18/2019 0.97 0.76 - 1.27 mg/dL Final          Passed - Valid encounter within last 12 months    Recent Outpatient Visits           3 months ago Poorly controlled type 2 diabetes mellitus [facility])   [facility] Jerrol Banana., MD   5 months ago Poorly controlled type 2 diabetes mellitus [facility])   [facility] Jerrol Banana., MD   6 months ago Type 2 diabetes mellitus with other specified complication, without long-term current use of insulin [facility])   [facility] Jerrol Banana[facility] MD   8 months ago Controlled type 2 diabetes mellitus without complication, without long-term current use of insulin [facility])   [facility] Jerrol Banana., MD   9 months ago Controlled type 2 diabetes mellitus without complication, without long-term current use of insulin [facility])   [facility] Jerrol Banana., MD       Future Appointments             In 1 month Gollan, [name], MD [facility]   In 1 month Jerrol Banana., MD [facility]

## 2020-09-25 ENCOUNTER — Encounter: Payer: Self-pay | Admitting: Family Medicine

## 2020-10-12 ENCOUNTER — Other Ambulatory Visit: Payer: Self-pay | Admitting: Family Medicine

## 2020-10-12 DIAGNOSIS — E785 Hyperlipidemia, unspecified: Secondary | ICD-10-CM

## 2020-10-12 DIAGNOSIS — I10 Essential (primary) hypertension: Secondary | ICD-10-CM

## 2020-10-13 NOTE — Telephone Encounter (Signed)
Requested Prescriptions  Pending Prescriptions Disp Refills  . losartan (COZAAR) 50 MG tablet [Pharmacy Med Name: LOSARTAN POTASSIUM 50 MG TAB] 90 tablet 2    Sig: TAKE 1 TABLET BY MOUTH EVERY DAY     Cardiovascular:  Angiotensin Receptor Blockers Failed - 10/12/2020  7:46 PM      Failed - Cr in normal range and within 180 days    Creat  Date Value Ref Range Status  05/23/2017 0.77 0.70 - 1.25 mg/dL Final    Comment:    For patients >64 years of age, the reference limit for Creatinine is approximately 13% higher for people identified as African-American. .    Creatinine, Ser  Date Value Ref Range Status  12/18/2019 0.97 0.76 - 1.27 mg/dL Final         Failed - K in normal range and within 180 days    Potassium  Date Value Ref Range Status  12/18/2019 4.6 3.5 - 5.2 mmol/L Final         Passed - Patient is not pregnant      Passed - Last BP in normal range    BP Readings from Last 1 Encounters:  06/10/20 120/65         Passed - Valid encounter within last 6 months    Recent Outpatient Visits          4 months ago Poorly controlled type 2 diabetes mellitus [facility])   [facility] Jerrol Banana[facility] MD   6 months ago Poorly controlled type 2 diabetes mellitus [facility])   [facility] Jerrol Banana[facility] MD   7 months ago Type 2 diabetes mellitus with other specified complication, without long-term current use of insulin [facility])   [facility] Jerrol Banana., MD   9 months ago Controlled type 2 diabetes mellitus without complication, without long-term current use of insulin [facility])   [facility] Jerrol Banana., MD   10 months ago Controlled type 2 diabetes mellitus without complication, without long-term current use of insulin [facility])   [facility] Jerrol Banana., MD      Future Appointments            In 1 month Gollan, [name], MD [facility], [facility]   In  1 month Jerrol Banana., MD [facility], [facility]           . ezetimibe (ZETIA) 10 MG tablet [Pharmacy Med Name: EZETIMIBE 10 MG TABLET] 90 tablet 0    Sig: TAKE 1 TABLET BY MOUTH EVERY DAY     Cardiovascular:  Antilipid - Sterol Transport Inhibitors Failed - 10/12/2020  7:46 PM      Failed - LDL in normal range and within 360 days    LDL Cholesterol (Calc)  Date Value Ref Range Status  05/23/2017  mg/dL (calc) Final    Comment:    . LDL cholesterol not calculated. Triglyceride levels greater than 400 mg/dL invalidate calculated LDL results. . Reference range: <100 . Desirable range <100 mg/dL for primary prevention;   <70 mg/dL for patients with CHD or diabetic patients  with > or = 2 CHD risk factors. Marland Kitchen LDL-C is now calculated using the Martin-Hopkins  calculation, which is a validated novel method providing  better accuracy than the Friedewald equation in the  estimation of LDL-C.  Cresenciano Genre et al. Annamaria Helling. 3329;518(84): 2061-2068  (http://education.[employer].com/faq/FAQ164)    LDL Chol Calc (NIH)  Date  Value Ref Range Status  12/18/2019 53 0 - 99 mg/dL Final         Failed - HDL in normal range and within 360 days    HDL  Date Value Ref Range Status  12/18/2019 35 (L) >39 mg/dL Final         Passed - Total Cholesterol in normal range and within 360 days    Cholesterol, Total  Date Value Ref Range Status  12/18/2019 108 100 - 199 mg/dL Final         Passed - Triglycerides in normal range and within 360 days    Triglycerides  Date Value Ref Range Status  12/18/2019 110 0 - 149 mg/dL Final         Passed - Valid encounter within last 12 months    Recent Outpatient Visits          4 months ago Poorly controlled type 2 diabetes mellitus [facility])   [facility] Jerrol Banana., MD   6 months ago Poorly controlled type 2 diabetes mellitus [facility])   [facility] Jer[facility] Banana., MD   7 months ago Type  2 diabetes mellitus with other specified complication, without long-term current use of insulin [facility])   [facility]   9 months ago Controlled type 2 diabetes mellitus without complication, without long-term current use of insulin [facility])   [facility] Jerrol Banana., MD   10 months ago Controlled type 2 diabetes mellitus without complication, without long-term current use of insulin [facility])   [facility] Jerrol Banana., MD      Future Appointments            In 1 month Gollan, [name], MD [facility], [facility]   In 1 month Jerrol Banana., MD [facility], [facility]

## 2020-10-13 NOTE — Telephone Encounter (Signed)
Requested medication (s) are due for refill today: yes  Requested medication (s) are on the active medication list: yes  Last refill: 07/21/20  Future visit scheduled: yes  Notes to clinic: historical provider    Requested Prescriptions  Pending Prescriptions Disp Refills   losartan (COZAAR) 50 MG tablet [Pharmacy Med Name: LOSARTAN POTASSIUM 50 MG TAB] 90 tablet 2    Sig: TAKE 1 TABLET BY MOUTH EVERY DAY      Cardiovascular:  Angiotensin Receptor Blockers Failed - 10/12/2020  7:46 PM      Failed - Cr in normal range and within 180 days    Creat  Date Value Ref Range Status  05/23/2017 0.77 0.70 - 1.25 mg/dL Final    Comment:    For patients >32 years of age, the reference limit for Creatinine is approximately 13% higher for people identified as African-American. .    Creatinine, Ser  Date Value Ref Range Status  12/18/2019 0.97 0.76 - 1.27 mg/dL Final          Failed - K in normal range and within 180 days    Potassium  Date Value Ref Range Status  12/18/2019 4.6 3.5 - 5.2 mmol/L Final          Passed - Patient is not pregnant      Passed - Last BP in normal range    BP Readings from Last 1 Encounters:  06/10/20 120/65          Passed - Valid encounter within last 6 months    Recent Outpatient Visits           4 months ago Poorly controlled type 2 diabetes mellitus ([place])   [facility] Jerrol Ban[facility] MD   6 months ago Poorly controlled type 2 diabetes mellitus [facility])   [facility] Jerrol Ban[facility] MD   7 months ago Type 2 diabetes mellitus with other specified complication, without long-term current use of insulin [facility])   [facility] Jerrol Banana., MD   9 months ago Controlled type 2 diabetes mellitus without complication, without long-term current use of insulin [facility])   [facility] Jerrol Banana., MD   10 months ago Controlled type 2 diabetes mellitus without  complication, without long-term current use of insulin [facility])   [facility] Jerrol Banana., MD       Future Appointments             In 1 month Gollan, [name], MD [facility], [facility]   In 1 month Jerrol Banana., MD [facility], [facility]              Signed Prescriptions Disp Refills   ezetimibe (ZETIA) 10 MG tablet 90 tablet 0    Sig: TAKE 1 TABLET BY MOUTH EVERY DAY      Cardiovascular:  Antilipid - Sterol Transport Inhibitors Failed - 10/12/2020  7:46 PM      Failed - LDL in normal range and within 360 days    LDL Cholesterol (Calc)  Date Value Ref Range Status  05/23/2017  mg/dL (calc) Final    Comment:    . LDL cholesterol not calculated. Triglyceride levels greater than 400 mg/dL invalidate calculated LDL results. . Reference range: <100 . Desirable range <100 mg/dL for primary prevention;   <70 mg/dL for patients with CHD or diabetic patients  with > or = 2 CHD risk factors. Marland Kitchen LDL-C  is now calculated using the Martin-Hopkins  calculation, which is a validated novel method providing  better accuracy than the Friedewald equation in the  estimation of LDL-C.  Cresenciano Genre et al. Annamaria Helling. 6270;350(09): 2061-2068  (http://education.[facility].com/faq/FAQ164)    LDL Chol Calc (NIH)  Date Value Ref Range Status  12/18/2019 53 0 - 99 mg/dL Final          Failed - HDL in normal range and within 360 days    HDL  Date Value Ref Range Status  12/18/2019 35 (L) >39 mg/dL Final          Passed - Total Cholesterol in normal range and within 360 days    Cholesterol, Total  Date Value Ref Range Status  12/18/2019 108 100 - 199 mg/dL Final          Passed - Triglycerides in normal range and within 360 days    Triglycerides  Date Value Ref Range Status  12/18/2019 110 0 - 149 mg/dL Final          Passed - Valid encounter within last 12 months    Recent Outpatient Visits           4 months ago  Poorly controlled type 2 diabetes mellitus [facility])   [facility] Jerrol Banana[facility] MD   6 months ago Poorly controlled type 2 diabetes mellitus [facility])   [facility] Jerrol Banana., MD   7 months ago Type 2 diabetes mellitus with other specified complication, without long-term current use of insulin [facility])   [facility] Jerrol Banana., MD   9 months ago Controlled type 2 diabetes mellitus without complication, without long-term current use of insulin [facility])   [facility] Jerrol Banana., MD   10 months ago Controlled type 2 diabetes mellitus without complication, without long-term current use of insulin [facility])   [facility] Jerrol Banana., MD       Future Appointments             In 1 month Gollan, [name], MD [facility], [facility]   In 1 month Jerrol Banana., MD [facility], [facility]

## 2020-10-19 ENCOUNTER — Other Ambulatory Visit: Payer: Self-pay | Admitting: Cardiovascular Disease

## 2020-11-09 ENCOUNTER — Other Ambulatory Visit: Payer: Self-pay | Admitting: Family Medicine

## 2020-11-09 DIAGNOSIS — E119 Type 2 diabetes mellitus without complications: Secondary | ICD-10-CM

## 2020-11-09 NOTE — Telephone Encounter (Signed)
Requested medication (s) are due for refill today: yes  Requested medication (s) are on the active medication list: yes  Last refill:  08/21/20  Future visit scheduled: yes  Notes to clinic:  over due lab work   Requested Prescriptions  Pending Prescriptions Disp Refills   metFORMIN (GLUCOPHAGE) 1000 MG tablet [Pharmacy Med Name: METFORMIN HCL 1,000 MG TABLET] 180 tablet 0    Sig: TAKE 1 TABLET (1,000 MG TOTAL) BY MOUTH 2 (TWO) TIMES DAILY WITH A MEAL.      Endocrinology:  Diabetes - Biguanides Passed - 11/09/2020 10:47 AM      Passed - Cr in normal range and within 360 days    Creat  Date Value Ref Range Status  05/23/2017 0.77 0.70 - 1.25 mg/dL Final    Comment:    For patients >76 years of age, the reference limit for Creatinine is approximately 13% higher for people identified as African-American. .    Creatinine, Ser  Date Value Ref Range Status  12/18/2019 0.97 0.76 - 1.27 mg/dL Final          Passed - HBA1C is between 0 and 7.9 and within 180 days    Hemoglobin A1C  Date Value Ref Range Status  06/10/2020 7.8 (A) 4.0 - 5.6 % Final   Hgb A1c MFr Bld  Date Value Ref Range Status  11/22/2019 11.4 (H) 4.8 - 5.6 % Final    Comment:    (NOTE) Pre diabetes:          5.7%-6.4% Diabetes:              >6.4% Glycemic control for   <7.0% adults with diabetes           Passed - eGFR in normal range and within 360 days    GFR, Est African American  Date Value Ref Range Status  05/23/2017 114 > OR = 60 mL/min/1.16m Final   GFR calc Af Amer  Date Value Ref Range Status  12/18/2019 96 >59 mL/min/1.73 Final    Comment:    **[facility] currently reports eGFR in compliance with the current**   recommendations of the [employer] [facility] will   update reporting as new guidelines are published from the NKF-ASN   Task force.    GFR, Est Non African American  Date Value Ref Range Status  05/23/2017 99 > OR = 60 mL/min/1.752mFinal   GFR calc non Af  Amer  Date Value Ref Range Status  12/18/2019 83 >59 mL/min/1.73 Final          Passed - Valid encounter within last 6 months    Recent Outpatient Visits           5 months ago Poorly controlled type 2 diabetes mellitus ([facility]  [facility]   7 months ago Poorly controlled type 2 diabetes mellitus ([facility]  [facility] MD   8 months ago Type 2 diabetes mellitus with other specified complication, without long-term current use of insulin [facility]  [facility]   9 months ago Controlled type 2 diabetes mellitus without complication, without long-term current use of insulin ([facility]  [facility]   11 months ago Controlled type 2 diabetes mellitus without complication, without long-term current use of insulin ([facility]  Bu[facility]iJer[facility]ana MD  Future Appointments             In 3 days Gollan, [name], MD [facility], [facility]   In 3 days Jerrol Banana., MD [facility] [place]

## 2020-11-12 ENCOUNTER — Other Ambulatory Visit: Payer: Self-pay | Admitting: Family Medicine

## 2020-11-12 ENCOUNTER — Encounter: Payer: Self-pay | Admitting: Family Medicine

## 2020-11-12 ENCOUNTER — Other Ambulatory Visit: Payer: Self-pay

## 2020-11-12 ENCOUNTER — Ambulatory Visit (INDEPENDENT_AMBULATORY_CARE_PROVIDER_SITE_OTHER): Payer: Managed Care, Other (non HMO) | Admitting: Cardiovascular Disease

## 2020-11-12 ENCOUNTER — Encounter: Payer: Self-pay | Admitting: Cardiovascular Disease

## 2020-11-12 ENCOUNTER — Ambulatory Visit (INDEPENDENT_AMBULATORY_CARE_PROVIDER_SITE_OTHER): Payer: Managed Care, Other (non HMO) | Admitting: Family Medicine

## 2020-11-12 VITALS — BP 140/72 | HR 76 | Resp 16 | Ht 71.0 in | Wt 262.8 lb

## 2020-11-12 VITALS — BP 140/70 | HR 76 | Ht 71.0 in | Wt 261.1 lb

## 2020-11-12 DIAGNOSIS — E1169 Type 2 diabetes mellitus with other specified complication: Secondary | ICD-10-CM

## 2020-11-12 DIAGNOSIS — I350 Nonrheumatic aortic (valve) stenosis: Secondary | ICD-10-CM | POA: Diagnosis not present

## 2020-11-12 DIAGNOSIS — E782 Mixed hyperlipidemia: Secondary | ICD-10-CM

## 2020-11-12 DIAGNOSIS — I10 Essential (primary) hypertension: Secondary | ICD-10-CM

## 2020-11-12 DIAGNOSIS — E1165 Type 2 diabetes mellitus with hyperglycemia: Secondary | ICD-10-CM | POA: Diagnosis not present

## 2020-11-12 DIAGNOSIS — Z952 Presence of prosthetic heart valve: Secondary | ICD-10-CM

## 2020-11-12 DIAGNOSIS — I25118 Atherosclerotic heart disease of native coronary artery with other forms of angina pectoris: Secondary | ICD-10-CM | POA: Diagnosis not present

## 2020-11-12 DIAGNOSIS — Z951 Presence of aortocoronary bypass graft: Secondary | ICD-10-CM

## 2020-11-12 DIAGNOSIS — Z0000 Encounter for general adult medical examination without abnormal findings: Secondary | ICD-10-CM | POA: Diagnosis not present

## 2020-11-12 DIAGNOSIS — I060 Rheumatic aortic stenosis: Secondary | ICD-10-CM

## 2020-11-12 LAB — POCT URINALYSIS DIPSTICK
Bilirubin, UA: NEGATIVE
Blood, UA: NEGATIVE
Glucose, UA: NEGATIVE
Ketones, UA: NEGATIVE
Leukocytes, UA: NEGATIVE
Nitrite, UA: NEGATIVE
Protein, UA: NEGATIVE
Spec Grav, UA: <=1.005 — AB (ref 1.010–1.025)
Urobilinogen, UA: 0.2 E.U./dL
pH, UA: 6 (ref 5.0–8.0)

## 2020-11-12 MED ORDER — OZEMPIC (0.25 OR 0.5 MG/DOSE) 2 MG/1.5ML ~~LOC~~ SOPN: PEN_INJECTOR | SUBCUTANEOUS | 3 refills | Status: DC

## 2020-11-12 MED ORDER — SILDENAFIL CITRATE 20 MG PO TABS
20.0000 mg | ORAL_TABLET | Freq: Three times a day (TID) | ORAL | 3 refills | Status: DC | PRN
Start: 2020-11-12 — End: 2021-11-23

## 2020-11-12 NOTE — Patient Instructions (Addendum)
Medication Instructions:  No changes  If you need a refill on your cardiac medications before your next appointment, please call your pharmacy.    Lab work: No new labs needed  Testing/Procedures: Echo for prosthetic aortic valve, s/p CABG  Your physician has requested that you have an echocardiogram. Echocardiography is a painless test that uses sound waves to create images of your heart. It provides your doctor with information about the size and shape of your heart and how well your heart's chambers and valves are working. This procedure takes approximately one hour. There are no restrictions for this procedure.  There is a possibility that an IV may need to be started during your test to inject an image enhancing agent. This is done to obtain more optimal pictures of your heart. Therefore we ask that you do at least drink some water prior to coming in to hydrate your veins.   Follow-Up:  . You will need a follow up appointment in 12 months  . Providers on your designated Care Team:   . Murray Hodgkins, NP . Christell Faith, PA-C . Marrianne Mood, PA-C   COVID-19 Vaccine Information can be found at: [email or web address] For questions related to vaccine distribution or appointments, please email [email or web address] or call [phone].

## 2020-11-12 NOTE — Progress Notes (Signed)
Complete physical exam   Patient: Jeffery West   DOB: Jul 05, 1955   65 y.o. Male  MRN: [id number] Visit Date: 11/12/2020  Today's healthcare provider: [name], MD   Chief Complaint  Patient presents with  . Annual Exam   Subjective     HPI  Jeffery West is a 66 y.o. male who presents today for a complete physical exam.  He reports consuming a general diet. The patient does not participate in regular exercise at present. He generally feels fairly well. He reports sleeping poorly. He does have additional problems to discuss today.  Patient is not exercising nor watching his diet closely. Past Medical History:  Diagnosis Date  . Coronary artery disease   . Diabetes mellitus without complication ([name])   . Erectile dysfunction   . Gout   . Heart murmur   . Hyperlipidemia   . Hypertension   . Obesity   . Osteoarthritis   . Vitamin D deficiency    Past Surgical History:  Procedure Laterality Date  . AORTIC VALVE REPLACEMENT N/A 11/26/2019   Procedure: AORTIC VALVE REPLACEMENT (AVR) USING INSPIRIS AORTIC VALVE SIZE 23MM;  Surgeon: Gaye Pollack, MD;  Location: [facility];  Service: Open Heart Surgery;  Laterality: N/A;  . CARDIAC CATHETERIZATION    . COLONOSCOPY WITH PROPOFOL N/A 08/24/2019   Procedure: COLONOSCOPY WITH PROPOFOL;  Surgeon: Lucilla Lame, MD;  [facility] END[facility];  Service: Endoscopy;  Laterality: N/A;  . CORONARY ARTERY BYPASS GRAFT N/A 11/26/2019   Procedure: CORONARY ARTERY BYPASS GRAFTING (CABG) X Two , using left internal mammary artery and right leg greater saphenous vein harvested endoscopically.;  Surgeon: Gaye Pollack, MD;  Location: [place] OR;  Service: Open Heart Surgery;  Laterality: N/A;  . LEFT HEART CATH AND CORONARY ANGIOGRAPHY Left 10/19/2019   Procedure: LEFT HEART CATH AND CORONARY ANGIOGRAPHY;  Surgeon: Minna Merritts, MD;  Location: [place] CV LAB;  Service: Cardiovascular;  Laterality: Left;  . NO PAST SURGERIES    . RIGHT  HEART CATH N/A 10/19/2019   Procedure: RIGHT HEART CATH;  Surgeon: Minna Merritts, MD;  Location: [place] CV LAB;  Service: Cardiovascular;  Laterality: N/A;  . SHOULDER ARTHROSCOPY WITH OPEN ROTATOR CUFF REPAIR Right 07/26/2017   Procedure: SHOULDER ARTHROSCOPY WITH OPEN ROTATOR CUFF REPAIR,SUBACROMINAL DECOMPRESSION,DISTAL CLAVICLE EXCISION;  Surgeon: Thornton Park, MD;  Location: [facility] ORS;  Service: Orthopedics;  Laterality: Right;  . TEE WITHOUT CARDIOVERSION N/A 11/26/2019   Procedure: TRANSESOPHAGEAL ECHOCARDIOGRAM (TEE);  Surgeon: Gaye Pollack, MD;  Location: [place];  Service: Open Heart Surgery;  Laterality: N/A;   Social History   Socioeconomic History  . Marital status: Married    Spouse name: Not on file  . Number of children: Not on file  . Years of education: Not on file  . Highest education level: Not on file  Occupational History  . Not on file  Tobacco Use  . Smoking status: Never Smoker  . Smokeless tobacco: Never Used  Vaping Use  . Vaping Use: Never used  Substance and Sexual Activity  . Alcohol use: Yes    Alcohol/week: 0.0 standard drinks    Comment: social  . Drug use: No  . Sexual activity: Not on file  Other Topics Concern  . Not on file  Social History Narrative  . Not on file   Social Determinants of Health   Financial Resource Strain: Not on file  Food Insecurity: Not on file  Transportation  Needs: Not on file  Physical Activity: Not on file  Stress: Not on file  Social Connections: Not on file  Intimate Partner Violence: Not on file   Family Status  Relation Name Status  . Mother  Alive  . Father  Deceased  . Sister Daralene Milch  . Brother  Alive   Family History  Problem Relation Age of Onset  . Asthma Mother   . Cancer Father        possibly in liver and lung  . Hypertension Brother    Allergies  Allergen Reactions  . Penicillins Hives, Swelling and Other (See Comments)    Has patient had a PCN reaction causing  immediate rash, facial/tongue/throat swelling, SOB or lightheadedness with hypotension: Unknown Has patient had a PCN reaction causing severe rash involving mucus membranes or skin necrosis: No Has patient had a PCN reaction that required hospitalization: no Has patient had a PCN reaction occurring within the last 10 years: No If all of the above answers are "NO", then may proceed with Cephalosporin use.     Patient Care Team: Jerrol Banana., MD as PCP - General (Family Medicine) Minna Merritts, MD as PCP - Cardiology (Cardiology)   Medications: Outpatient Medications Prior to Visit  Medication Sig  . acetaminophen (TYLENOL) 500 MG tablet Take 1,000 mg by mouth every 6 (six) hours as needed for moderate pain or headache.  . albuterol (PROAIR HFA) 108 (90 Base) MCG/ACT inhaler Inhale 1 puff into the lungs every 6 (six) hours as needed for wheezing or shortness of breath.  . allopurinol (ZYLOPRIM) 300 MG tablet TAKE 1 TABLET BY MOUTH EVERY DAY  . aspirin EC 81 MG tablet Take 81 mg by mouth daily. Swallow whole.  . BD PEN NEEDLE NANO 2ND GEN 32G X 4 MM MISC USE AS DIRECTED  . Cholecalciferol (VITAMIN D) 2000 units CAPS Take 2,000 Units by mouth every evening.   . Dulaglutide (TRULICITY) 1.5 YP/9.5KD SOPN Inject 1.5 mg into the skin once a week.  . ezetimibe (ZETIA) 10 MG tablet TAKE 1 TABLET BY MOUTH EVERY DAY  . glucose blood (ONETOUCH VERIO) test strip Use as instructed; BID  . insulin degludec (TRESIBA) 100 UNIT/ML FlexTouch Pen INJECT 25 UNITS INTO THE SKIN DAILY AT 10 PM.  . losartan (COZAAR) 50 MG tablet TAKE 1 TABLET BY MOUTH EVERY DAY  . magnesium oxide (MAG-OX) 400 MG tablet Take 400 mg by mouth 2 (two) times daily.   . metFORMIN (GLUCOPHAGE) 1000 MG tablet TAKE 1 TABLET (1,000 MG TOTAL) BY MOUTH 2 (TWO) TIMES DAILY WITH A MEAL.  . metoprolol tartrate (LOPRESSOR) 25 MG tablet Take 1 tablet (25 mg total) by mouth 2 (two) times daily.  Marland Kitchen nystatin cream (MYCOSTATIN) Apply 1  application topically 2 (two) times daily.  [name] Delica Lancets 32I MISC Use 3 times a day  . rosuvastatin (CRESTOR) 5 MG tablet TAKE 1 TABLET BY MOUTH EVERY DAY  . sildenafil (REVATIO) 20 MG tablet Take 1 tablet (20 mg total) by mouth 3 (three) times daily as needed.  . tamsulosin (FLOMAX) 0.4 MG CAPS capsule TAKE 1 CAPSULE BY MOUTH EVERY DAY  . traMADol (ULTRAM) 50 MG tablet Take 50 mg by mouth every 6 (six) hours as needed.  . Insulin Syringe-Needle U-100 30G X 1/2" 1 ML MISC Use with insulin 3 times daily (Patient not taking: Reported on 11/12/2020)   No facility-administered medications prior to visit.    Review of Systems  Genitourinary: Positive  for frequency and urgency.  Musculoskeletal: Positive for back pain.  All other systems reviewed and are negative.      Objective    BP 140/72   Pulse 76   Resp 16   Ht 5\' 11"  (1.803 m)   Wt 262 lb 12.8 oz (119.2 kg)   SpO2 96%   BMI 36.65 kg/m     Physical Exam Vitals reviewed.  Constitutional:      Appearance: He is well-developed. He is obese.  HENT:     Head: Normocephalic and atraumatic.     Right Ear: External ear normal.     Left Ear: External ear normal.     Nose: Nose normal.  Eyes:     General: No scleral icterus.    Conjunctiva/sclera: Conjunctivae normal.  Neck:     Thyroid: No thyromegaly.  Cardiovascular:     Rate and Rhythm: Normal rate and regular rhythm.     Heart sounds: Murmur heard.      Comments: 2/6 murmur loudest at the right upper sternal border Pulmonary:     Effort: Pulmonary effort is normal.     Breath sounds: Normal breath sounds.  Abdominal:     Palpations: Abdomen is soft.  Genitourinary:    Penis: Normal.      Testes: Normal.  Skin:    General: Skin is warm and dry.     Comments: Atypical nevus of the back.  Neurological:     General: No focal deficit present.     Mental Status: He is alert and oriented to person, place, and time.  Psychiatric:        Mood and Affect:  Mood normal.        Behavior: Behavior normal.        Thought Content: Thought content normal.        Judgment: Judgment normal.       Last depression screening scores PHQ 2/9 Scores 11/12/2020 04/16/2020 02/05/2020  PHQ - 2 Score 0 1 0  PHQ- 9 Score 0 7 3   Last fall risk screening Fall Risk  11/12/2020  Falls in the past year? 0  Number falls in past yr: 0  Injury with Fall? 0  Risk for fall due to : -  Follow up -   Last Audit-C alcohol use screening Alcohol Use Disorder Test (AUDIT) 11/12/2020  1. How often do you have a drink containing alcohol? 2  2. How many drinks containing alcohol do you have on a typical day when you are drinking? 0  3. How often do you have six or more drinks on one occasion? 0  AUDIT-C Score 2   A score of 3 or more in women, and 4 or more in men indicates increased risk for alcohol abuse, EXCEPT if all of the points are from question 1   No results found for any visits on 11/12/20.  Assessment & Plan    Routine Health Maintenance and Physical Exam  Exercise Activities and Dietary recommendations Goals   None     Immunization History  Administered Date(s) Administered  . Influenza Split 07/05/2011  . Influenza,inj,Quad PF,6+ Mos 05/28/2013, 04/18/2018, 04/01/2020  . Influenza-Unspecified 04/29/2017  . PFIZER(Purple Top)SARS-COV-2 Vaccination 09/19/2019, 10/10/2019  . Pneumococcal Polysaccharide-23 04/29/2017  . Td 12/30/2003  . Tdap 05/23/2017    Health Maintenance  Topic Date Due  . HIV Screening  Never done  . COVID-19 Vaccine (3 - Pfizer risk 4-dose series) 11/07/2019  . HEMOGLOBIN A1C  12/09/2020  .  INFLUENZA VACCINE  01/19/2021  . OPHTHALMOLOGY EXAM  01/23/2021  . FOOT EXAM  02/25/2021  . COLONOSCOPY (Pts 45-88yrs Insurance coverage will need to be confirmed)  08/23/2024  . TETANUS/TDAP  05/24/2027  . PNEUMOCOCCAL POLYSACCHARIDE VACCINE AGE 68-64 HIGH RISK  Completed  . Hepatitis C Screening  Completed  . HPV VACCINES  Aged  Out    Discussed health benefits of physical activity, and encouraged him to engage in regular exercise appropriate for his age and condition.  1. Annual physical exam  - CBC with Differential/Platelet - Comprehensive metabolic panel - Lipid panel - TSH - PSA - POCT urinalysis dipstick  2. Type 2 diabetes mellitus with other specified complication, without long-term current use of insulin (HCC) Goal A1c less than 7.  No insurance change Trulicity to [employer].  Consider Jardiance or Farxiga - Hemoglobin A1c  3. Mixed hyperlipidemia High-dose statin  4. Primary hypertension Good control. 5.  Status post CABG 6 morbid obesity  No follow-ups on file.     I, [name], MD, have reviewed all documentation for this visit. The documentation on 11/12/20 for the exam, diagnosis, procedures, and orders are all accurate and complete.    Malikye Reppond Cranford Mon, MD  [facility] [phone] (phone) [phone] (fax)  [place]

## 2020-11-12 NOTE — Progress Notes (Signed)
Cardiology Office Note  Date:  11/12/2020   ID:  Jeffery, West 07/07/1955, MRN [id number]  PCP:  Jerrol Banana., MD   Chief Complaint  Patient presents with  . 6 month follow up     "doing well." Medications reviewed by the patient verbally.     HPI:  Jeffery West is a 65 year old gentleman with past medical history of Diabetes   elevated hemoglobin A1c 8-10 Smoker, remote HTN Hyperlipidemia Morbid obesity  aortic valve stenosis  on echocardiogram June 2017 Who presents for follow-up of his coronary disease, recent aortic valve replacement with CABG x2  Recovered well since 2021 when he had his cardiac surgery, feels he has had no major setbacks  In follow-up today reports doing relatively well Some back issue Works in Psychologist, educational Works 4 days a week  No regular exercise program Poor diet, weight up 5 pounds in 6 months  Scheduled to have lab work done, afraid the numbers are going to run high  Labs: A1C 7.8 in dec 2021 Total chol 108, LDL 53  No baseline echo since surgery  EKG personally reviewed by myself on todays visit NSR rate 76 bpm no ST or T wave changes  Cardiac work-up   Echo 09/28/2019 showed increase in aortic valve mean gradient of 53.5 mmHg, LVEF 60 to 79%, grade 1 diastolic dysfunction.    Cardiac catheterization 10/19/2019 showed 80% stenosis to mid LAD, 70% first diagonal stenosis.  Underwent coronary bypass graft surgery and bioprosthetic aortic valve replacement AVR (58mm Edwards Inspiris Resilia pericardial valve) and CABGx2 (LIMA-LAD, SVG-first diagonal) 11/26/2019.    PMH:   has a past medical history of Coronary artery disease, Diabetes mellitus without complication ([place]), Erectile dysfunction, Gout, Heart murmur, Hyperlipidemia, Hypertension, Obesity, Osteoarthritis, and Vitamin D deficiency.  PSH:    Past Surgical History:  Procedure Laterality Date  . AORTIC VALVE REPLACEMENT N/A 11/26/2019   Procedure: AORTIC VALVE  REPLACEMENT (AVR) USING INSPIRIS AORTIC VALVE SIZE 23MM;  Surgeon: Gaye Pollack, MD;  Location: [place];  Service: Open Heart Surgery;  Laterality: N/A;  . CARDIAC CATHETERIZATION    . COLONOSCOPY WITH PROPOFOL N/A 08/24/2019   Procedure: COLONOSCOPY WITH PROPOFOL;  Surgeon: Lucilla Lame, MD;  Location: [facility] ENDOSCOPY;  Service: Endoscopy;  Laterality: N/A;  . CORONARY ARTERY BYPASS GRAFT N/A 11/26/2019   Procedure: CORONARY ARTERY BYPASS GRAFTING (CABG) X Two , using left internal mammary artery and right leg greater saphenous vein harvested endoscopically.;  Surgeon: Gaye Pollack, MD;  Location: [place] OR;  Service: Open Heart Surgery;  Laterality: N/A;  . LEFT HEART CATH AND CORONARY ANGIOGRAPHY Left 10/19/2019   Procedure: LEFT HEART CATH AND CORONARY ANGIOGRAPHY;  Surgeon: Minna Merritts, MD;  Location: [place] CV LAB;  Service: Cardiovascular;  Laterality: Left;  . NO PAST SURGERIES    . RIGHT HEART CATH N/A 10/19/2019   Procedure: RIGHT HEART CATH;  Surgeon: Minna Merritts, MD;  Location: [place] [facility] LAB;  Service: Cardiovascular;  Laterality: N/A;  . SHOULDER ARTHROSCOPY WITH OPEN ROTATOR CUFF REPAIR Right 07/26/2017   Procedure: SHOULDER ARTHROSCOPY WITH OPEN ROTATOR CUFF REPAIR,SUBACROMINAL DECOMPRESSION,DISTAL CLAVICLE EXCISION;  Surgeon: Thornton Park, MD;  Location: [facility] ORS;  Service: Orthopedics;  Laterality: Right;  . TEE WITHOUT CARDIOVERSION N/A 11/26/2019   Procedure: TRANSESOPHAGEAL ECHOCARDIOGRAM (TEE);  Surgeon: Gaye Pollack, MD;  Location: [place];  Service: Open Heart Surgery;  Laterality: N/A;    Current Outpatient Medications  Medication Sig Dispense Refill  .  acetaminophen (TYLENOL) 500 MG tablet Take 1,000 mg by mouth every 6 (six) hours as needed for moderate pain or headache.    . albuterol (PROAIR HFA) 108 (90 Base) MCG/ACT inhaler Inhale 1 puff into the lungs every 6 (six) hours as needed for wheezing or shortness of breath. 18 g 3  . allopurinol  (ZYLOPRIM) 300 MG tablet TAKE 1 TABLET BY MOUTH EVERY DAY 90 tablet 0  . aspirin EC 81 MG tablet Take 81 mg by mouth daily. Swallow whole.    . BD PEN NEEDLE NANO 2ND GEN 32G X 4 MM MISC USE AS DIRECTED 100 each 8  . Cholecalciferol (VITAMIN D) 2000 units CAPS Take 2,000 Units by mouth every evening.     . Dulaglutide (TRULICITY) 1.5 JS/9.7WY SOPN Inject 1.5 mg into the skin once a week. 0.5 mL 4  . ezetimibe (ZETIA) 10 MG tablet TAKE 1 TABLET BY MOUTH EVERY DAY 90 tablet 0  . glucose blood (ONETOUCH VERIO) test strip Use as instructed; BID 100 each 12  . insulin degludec (TRESIBA) 100 UNIT/ML FlexTouch Pen INJECT 25 UNITS INTO THE SKIN DAILY AT 10 PM.    . Insulin Syringe-Needle U-100 30G X 1/2" 1 ML MISC Use with insulin 3 times daily 270 each 3  . losartan (COZAAR) 50 MG tablet TAKE 1 TABLET BY MOUTH EVERY DAY 90 tablet 2  . magnesium oxide (MAG-OX) 400 MG tablet Take 400 mg by mouth 2 (two) times daily.     . metFORMIN (GLUCOPHAGE) 1000 MG tablet TAKE 1 TABLET (1,000 MG TOTAL) BY MOUTH 2 (TWO) TIMES DAILY WITH A MEAL. 180 tablet 0  . metoprolol tartrate (LOPRESSOR) 25 MG tablet Take 1 tablet (25 mg total) by mouth 2 (two) times daily. 180 tablet 3  . nystatin cream (MYCOSTATIN) Apply 1 application topically 2 (two) times daily. 30 g 6  . OneTouch Delica Lancets 63Z MISC Use 3 times a day 100 each 12  . rosuvastatin (CRESTOR) 5 MG tablet TAKE 1 TABLET BY MOUTH EVERY DAY 90 tablet 0  . tamsulosin (FLOMAX) 0.4 MG CAPS capsule TAKE 1 CAPSULE BY MOUTH EVERY DAY 90 capsule 3  . traMADol (ULTRAM) 50 MG tablet Take 50 mg by mouth every 6 (six) hours as needed.    . sildenafil (REVATIO) 20 MG tablet Take 1 tablet (20 mg total) by mouth 3 (three) times daily as needed. 90 tablet 3   No current facility-administered medications for this visit.     Allergies:   Penicillins   Social History:  The patient  reports that he has never smoked. He has never used smokeless tobacco. He reports current  alcohol use. He reports that he does not use drugs.   Family History:   family history includes Asthma in his mother; Cancer in his father; Hypertension in his brother.    Review of Systems: Review of Systems  Constitutional: Negative.   HENT: Negative.   Respiratory: Negative.   Cardiovascular: Negative.   Gastrointestinal: Negative.   Musculoskeletal: Negative.   Neurological: Negative.   Psychiatric/Behavioral: Negative.   All other systems reviewed and are negative.   PHYSICAL EXAM: VS:  BP 140/70 (BP Location: Left Arm, Patient Position: Sitting, Cuff Size: Large)   Pulse 76   Ht 5\' 11"  (1.803 m)   Wt 261 lb 2 oz (118.4 kg)   SpO2 97%   BMI 36.42 kg/m  , BMI Body mass index is 36.42 kg/m. Constitutional:  oriented to person, place, and time. No distress.  HENT:  Head: Grossly normal Eyes:  no discharge. No scleral icterus.  Neck: No JVD, no carotid bruits  Cardiovascular: Regular rate and rhythm, no murmurs appreciated Pulmonary/Chest: Clear to auscultation bilaterally, no wheezes or rails Abdominal: Soft.  no distension.  no tenderness.  Musculoskeletal: Normal range of motion Neurological:  normal muscle tone. Coordination normal. No atrophy Skin: Skin warm and dry Psychiatric: normal affect, pleasant   Recent Labs: 11/27/2019: Magnesium 2.2 11/28/2019: Hemoglobin 12.2; Platelets 96 12/18/2019: ALT 34; BUN 13; Creatinine, Ser 0.97; Potassium 4.6; Sodium 140    Lipid Panel Lab Results  Component Value Date   CHOL 108 12/18/2019   HDL 35 (L) 12/18/2019   LDLCALC 53 12/18/2019   TRIG 110 12/18/2019      Wt Readings from Last 3 Encounters:  11/12/20 261 lb 2 oz (118.4 kg)  06/10/20 260 lb (117.9 kg)  05/13/20 256 lb (116.1 kg)      ASSESSMENT AND PLAN:  Problem List Items Addressed This Visit      Cardiology Problems   Aortic valve stenosis   Relevant Medications   sildenafil (REVATIO) 20 MG tablet   Other Relevant Orders   ECHOCARDIOGRAM  COMPLETE     Other   S/P AVR   Poorly controlled type 2 diabetes mellitus ([place])   Morbid obesity ([place])    Other Visit Diagnoses    Coronary artery disease of native artery of native heart with stable angina pectoris ([place])    -  Primary   Relevant Medications   traMADol (ULTRAM) 50 MG tablet   sildenafil (REVATIO) 20 MG tablet   Other Relevant Orders   EKG 12-Lead   Essential hypertension       Relevant Medications   sildenafil (REVATIO) 20 MG tablet   S/P aortic valve replacement with prosthetic valve       Relevant Medications   sildenafil (REVATIO) 20 MG tablet   Other Relevant Orders   ECHOCARDIOGRAM COMPLETE     CAD with chronic stable angina CABG x2 2021, recovered well, no angina Aim for hemoglobin A1c in the 6 range He is to lose weight, start walking program, drastically changed diet  Diabetes type 2 with complications Stressed importance of follow-up with endocrinology, diet modification, walking program Again discussed with him, diet poor, not exercising, weight trending upwards Consider further diabetes medication adjustment Consider farxiga/jardiance. He will talk with Dr. Rosanna Randy , will defer to his office  Aortic valve stenosis, status post AVR  bioprosthetic valve placement 2021 echocardiogram scheduled for baseline study following surgery  Hyperlipidemia Cholesterol is at goal on the current lipid regimen. No changes to the medications were made.    Total encounter time more than 25 minutes  Greater than 50% was spent in counseling and coordination of care with the patient    Signed, Esmond Plants, M.D., Ph.D. [place], [place]

## 2020-11-12 NOTE — Patient Instructions (Signed)

## 2020-11-12 NOTE — Telephone Encounter (Signed)
Requested medication (s) are due for refill today:  Prescribed today by Dr. Rosanna Randy  Requested medication (s) are on the active medication list:   New medication  Future visit scheduled:   Yes   Last ordered: Today.   Pharmacy requesting an alternative.  This is not covered by her insurance.   There are suggested alternatives or needs a PA.   Requested Prescriptions  Pending Prescriptions Disp Refills   BYDUREON 2 MG PEN [Pharmacy Med Name: BYDUREON 2 MG PEN INJECT]  0      Endocrinology:  Diabetes - GLP-1 Receptor Agonists - exenatide Passed - 11/12/2020  3:06 PM      Passed - HBA1C is between 0 and 7.9 and within 180 days    Hemoglobin A1C  Date Value Ref Range Status  06/10/2020 7.8 (A) 4.0 - 5.6 % Final   Hgb A1c MFr Bld  Date Value Ref Range Status  11/22/2019 11.4 (H) 4.8 - 5.6 % Final    Comment:    (NOTE) Pre diabetes:          5.7%-6.4% Diabetes:              >6.4% Glycemic control for   <7.0% adults with diabetes           Passed - Cr in normal range and within 360 days    Creat  Date Value Ref Range Status  05/23/2017 0.77 0.70 - 1.25 mg/dL Final    Comment:    For patients >35 years of age, the reference limit for Creatinine is approximately 13% higher for people identified as African-American. .    Creatinine, Ser  Date Value Ref Range Status  12/18/2019 0.97 0.76 - 1.27 mg/dL Final          Passed - eGFR in normal range and within 360 days    GFR, Est African American  Date Value Ref Range Status  05/23/2017 114 > OR = 60 mL/min/1.72m Final   GFR calc Af Amer  Date Value Ref Range Status  12/18/2019 96 >59 mL/min/1.73 Final    Comment:    **[facility] currently reports eGFR in compliance with the current**   recommendations of the [employer] [facility] will   update reporting as new guidelines are published from the NKF-ASN   Task force.    GFR, Est Non African American  Date Value Ref Range Status  05/23/2017 99 > OR = 60  mL/min/1.722mFinal   GFR calc non Af Amer  Date Value Ref Range Status  12/18/2019 83 >59 mL/min/1.73 Final          Passed - Valid encounter within last 6 months    Recent Outpatient Visits           Today Annual physical exam   [facility]   [facility]   7 months [facility]   8 months ago Type 2 diabetes mellitus with other specified complication, [facility] long-term [facility] use [facility]   10 months ago Controlled type 2 diabetes mellitus without complication, without long-term current use of insulin ([facility]  [facility]       Future Appointments  In 3 months Jerrol Banana., MD [facility], [place]

## 2020-11-19 LAB — CBC WITH DIFFERENTIAL/PLATELET
Basophils Absolute: 0.1 10*3/uL (ref 0.0–0.2)
Basos: 1 %
EOS (ABSOLUTE): 0.2 10*3/uL (ref 0.0–0.4)
Eos: 2 %
Hematocrit: 49.5 % (ref 37.5–51.0)
Hemoglobin: 15.4 g/dL (ref 13.0–17.7)
Immature Grans (Abs): 0.1 10*3/uL (ref 0.0–0.1)
Immature Granulocytes: 1 %
Lymphocytes Absolute: 2.4 10*3/uL (ref 0.7–3.1)
Lymphs: 35 %
MCH: 28.9 pg (ref 26.6–33.0)
MCHC: 31.1 g/dL — ABNORMAL LOW (ref 31.5–35.7)
MCV: 93 fL (ref 79–97)
Monocytes Absolute: 0.6 10*3/uL (ref 0.1–0.9)
Monocytes: 8 %
Neutrophils Absolute: 3.7 10*3/uL (ref 1.4–7.0)
Neutrophils: 53 %
Platelets: 146 10*3/uL — ABNORMAL LOW (ref 150–450)
RBC: 5.33 x10E6/uL (ref 4.14–5.80)
RDW: 13 % (ref 11.6–15.4)
WBC: 7 10*3/uL (ref 3.4–10.8)

## 2020-11-19 LAB — TSH: TSH: 2.22 u[IU]/mL (ref 0.450–4.500)

## 2020-11-19 LAB — LIPID PANEL
Chol/HDL Ratio: 4.7 ratio (ref 0.0–5.0)
Cholesterol, Total: 159 mg/dL (ref 100–199)
HDL: 34 mg/dL — ABNORMAL LOW (ref >39)
LDL Chol Calc (NIH): 72 mg/dL (ref 0–99)
Triglycerides: 330 mg/dL — ABNORMAL HIGH (ref 0–149)
VLDL Cholesterol Cal: 53 mg/dL — ABNORMAL HIGH (ref 5–40)

## 2020-11-19 LAB — HEMOGLOBIN A1C
Est. average glucose Bld gHb Est-mCnc: 220 mg/dL
Hgb A1c MFr Bld: 9.3 % — ABNORMAL HIGH (ref 4.8–5.6)

## 2020-11-19 LAB — COMPREHENSIVE METABOLIC PANEL
ALT: 27 IU/L (ref 0–44)
AST: 21 IU/L (ref 0–40)
Albumin/Globulin Ratio: 1.8 (ref 1.2–2.2)
Albumin: 4.5 g/dL (ref 3.8–4.8)
Alkaline Phosphatase: 73 IU/L (ref 44–121)
BUN/Creatinine Ratio: 17 (ref 10–24)
BUN: 16 mg/dL (ref 8–27)
Bilirubin Total: 0.6 mg/dL (ref 0.0–1.2)
CO2: 21 mmol/L (ref 20–29)
Calcium: 9.7 mg/dL (ref 8.6–10.2)
Chloride: 100 mmol/L (ref 96–106)
Creatinine, Ser: 0.93 mg/dL (ref 0.76–1.27)
Globulin, Total: 2.5 g/dL (ref 1.5–4.5)
Glucose: 207 mg/dL — ABNORMAL HIGH (ref 65–99)
Potassium: 4.9 mmol/L (ref 3.5–5.2)
Sodium: 139 mmol/L (ref 134–144)
Total Protein: 7 g/dL (ref 6.0–8.5)
eGFR: 92 mL/min/{1.73_m2} (ref >59)

## 2020-11-19 LAB — PSA: Prostate Specific Ag, Serum: 1.9 ng/mL (ref 0.0–4.0)

## 2020-11-21 ENCOUNTER — Telehealth: Payer: Self-pay | Admitting: *Deleted

## 2020-11-21 NOTE — Telephone Encounter (Signed)
Patient is calling back regarding his new Rx:  Semaglutide,0.25 or 0.5MG /DOS, (OZEMPIC, 0.25 OR 0.5 MG/DOSE,) 2 MG/1.5ML SOPN Patient states he has contacted the pharmacy and they state they are still working on it- it has been 10 days. Patient thinks the office may have to contact his insurance like they did for the previous Rx.  Call to pharmacy- not covered by insurance- will need prior authorization for coverage.

## 2020-11-24 ENCOUNTER — Other Ambulatory Visit: Payer: Self-pay | Admitting: Family Medicine

## 2020-11-24 MED ORDER — TRULICITY 0.75 MG/0.5ML ~~LOC~~ SOAJ: 0.7500 mg | SUBCUTANEOUS | 12 refills | Status: DC

## 2020-11-24 NOTE — Telephone Encounter (Signed)
Requested medication (s) are due for refill today: yes  Requested medication (s) are on the active medication list: yes  Last refill:  ?  Future visit scheduled: yes  Notes to clinic: Pharmacy comment: Alternative Requested:THE PRESCRIBED MEDICATION IS NOT COVERED BY INSURANCE. PLEASE CONSIDER CHANGING TO ONE OF THE SUGGESTED COVERED ALTERNATIVES OR SUBMITTING FOR A PRIOR AUTHORIZATION   Requested Prescriptions  Pending Prescriptions Disp Refills   BYDUREON 2 MG PEN [Pharmacy Med Name: BYDUREON 2 MG PEN INJECT]  0      Endocrinology:  Diabetes - GLP-1 Receptor Agonists - exenatide Failed - 11/24/2020 12:30 PM      Failed - HBA1C is between 0 and 7.9 and within 180 days    Hgb A1c MFr Bld  Date Value Ref Range Status  11/18/2020 9.3 (H) 4.8 - 5.6 % Final    Comment:             Prediabetes: 5.7 - 6.4          Diabetes: >6.4          Glycemic control for adults with diabetes: <7.0           Passed - Cr in normal range and within 360 days    Creat  Date Value Ref Range Status  05/23/2017 0.77 0.70 - 1.25 mg/dL Final    Comment:    For patients >21 years of age, the reference limit for Creatinine is approximately 13% higher for people identified as African-American. .    Creatinine, Ser  Date Value Ref Range Status  11/18/2020 0.93 0.76 - 1.27 mg/dL Final          Passed - eGFR in normal range and within 360 days    GFR, Est African American  Date Value Ref Range Status  05/23/2017 114 > OR = 60 mL/min/1.68m Final   GFR calc Af Amer  Date Value Ref Range Status  12/18/2019 96 >59 mL/min/1.73 Final    Comment:    **[facility] currently reports eGFR in compliance with the current**   recommendations of the [employer] [facility] will   update reporting as new guidelines are published from the NKF-ASN   Task force.    GFR, Est Non African American  Date Value Ref Range Status  05/23/2017 99 > OR = 60 mL/min/1.724mFinal   GFR calc non Af Amer  Date  Value Ref Range Status  12/18/2019 83 >59 mL/min/1.73 Final   eGFR  Date Value Ref Range Status  11/18/2020 92 >59 mL/min/1.73 Final          Passed - Valid encounter within last 6 months    Recent Outpatient Visits           1 week ago Annual physical exam   [facility]   5 months ago Poorly controlled type 2 diabetes [facility] ([facility]  [facility]   7 months ago Poorly controlled type 2 diabetes mellitus ([facility]  [facility]   9 months ago Type 2 diabetes mellitus with other specified complication, without long-term current use of insulin ([facility]  [facility] MD   10 months ago Controlled type 2 diabetes mellitus without complication, without long-term current use of insulin ([facility]  [facility] MD       Future Appointments  In 2 months Jerrol Banana., MD [facility], [facility]               BYDUREON 2 MG PEN [Pharmacy Med Name: BYDUREON 2 MG PEN INJECT]  0      Endocrinology:  Diabetes - GLP-1 Receptor Agonists - exenatide Failed - 11/24/2020 12:30 PM      Failed - HBA1C is between 0 and 7.9 and within 180 days    Hgb A1c MFr Bld  Date Value Ref Range Status  11/18/2020 9.3 (H) 4.8 - 5.6 % Final    Comment:             Prediabetes: 5.7 - 6.4          Diabetes: >6.4          Glycemic control for adults with diabetes: <7.0           Passed - Cr in normal range and within 360 days    Creat  Date Value Ref Range Status  05/23/2017 0.77 0.70 - 1.25 mg/dL Final    Comment:    For patients >48 years of age, the reference limit for Creatinine is approximately 13% higher for people identified as African-American. .    Creatinine, Ser  Date Value Ref Range Status  11/18/2020 0.93 0.76 - 1.27 mg/dL Final          Passed - eGFR in  normal range and within 360 days    GFR, Est African American  Date Value Ref Range Status  05/23/2017 114 > OR = 60 mL/min/1.18m Final   GFR calc Af Amer  Date Value Ref Range Status  12/18/2019 96 >59 mL/min/1.73 Final    Comment:    **[facility] currently reports eGFR in compliance with the current**   recommendations of the [employer] [facility] will   update reporting as new guidelines are published from the NKF-ASN   Task force.    GFR, Est Non African American  Date Value Ref Range Status  05/23/2017 99 > OR = 60 mL/min/1.746mFinal   GFR calc non Af Amer  Date Value Ref Range Status  12/18/2019 83 >59 mL/min/1.73 Final   eGFR  Date Value Ref Range Status  11/18/2020 92 >59 mL/min/1.73 Final          Passed - Valid encounter within last 6 months    Recent Outpatient Visits           1 week ago Annual physical exam   [facility]   5 months ago Poorly controlled type 2 diabetes mellitus ([facility]  [facility]   7 months ago Poorly controlled type 2 diabetes mellitus ([facility]  BuPrem[facility]iJer[facility] Banana MD   9 months ago Type 2 diabetes mellitus with other specified complication, without long-term current use of insulin ([facility]  BuSon[facility] LLCiJerrol Banana MD   10 months ago Controlled type 2 diabetes mellitus without complication, without long-term current use of insulin ([facility]  BuConne[facility]iJerrol Banana MD       Future Appointments             In 2 months GiJerrol Banana MD [facility]

## 2020-11-24 NOTE — Telephone Encounter (Signed)
Notes to clinic:Alternative Requested:THE PRESCRIBED MEDICATION IS NOT COVERED BY INSURANCE. PLEASE CONSIDER CHANGING TO ONE OF THE SUGGESTED COVERED ALTERNATIVES OR SUBMITTING FOR A PRIOR AUTHORIZATION     Requested Prescriptions  Pending Prescriptions Disp Refills   BYDUREON 2 MG PEN [Pharmacy Med Name: BYDUREON 2 MG PEN INJECT]  0      Endocrinology:  Diabetes - GLP-1 Receptor Agonists - exenatide Failed - 11/24/2020  1:28 PM      Failed - HBA1C is between 0 and 7.9 and within 180 days    Hgb A1c MFr Bld  Date Value Ref Range Status  11/18/2020 9.3 (H) 4.8 - 5.6 % Final    Comment:             Prediabetes: 5.7 - 6.4          Diabetes: >6.4          Glycemic control for adults with diabetes: <7.0           Passed - Cr in normal range and within 360 days    Creat  Date Value Ref Range Status  05/23/2017 0.77 0.70 - 1.25 mg/dL Final    Comment:    For patients >72 years of age, the reference limit for Creatinine is approximately 13% higher for people identified as African-American. .    Creatinine, Ser  Date Value Ref Range Status  11/18/2020 0.93 0.76 - 1.27 mg/dL Final          Passed - eGFR in normal range and within 360 days    GFR, Est African American  Date Value Ref Range Status  05/23/2017 114 > OR = 60 mL/min/1.29m Final   GFR calc Af Amer  Date Value Ref Range Status  12/18/2019 96 >59 mL/min/1.73 Final    Comment:    **[facility] currently reports eGFR in compliance with the current**   recommendations of the [employer] [facility] will   update reporting as new guidelines are published from the NKF-ASN   Task force.    GFR, Est Non African American  Date Value Ref Range Status  05/23/2017 99 > OR = 60 mL/min/1.777mFinal   GFR calc non Af Amer  Date Value Ref Range Status  12/18/2019 83 >59 mL/min/1.73 Final   eGFR  Date Value Ref Range Status  11/18/2020 92 >59 mL/min/1.73 Final          Passed - Valid encounter within last  6 months    Recent Outpatient Visits           1 week ago Annual physical exam   [facility]   5 months ago Poorly controlled type 2 diabetes mellitus ([facility]  [facility]phi[facility] MD   7 months ago Poorly controlled type 2 diabetes mellitus ([facility]  Bu[facility]Jer[facility] Ban[facility]   9 months ago Type 2 diabetes mellitus with other specified complication, without long-term current use of insulin (H[facility]  Bu[facility]iJerrol Banana MD   10 months ago Controlled type 2 diabetes mellitus without complication, without long-term current use of insulin ([facility]  BuWichita [facility] LLCiJerrol Banana MD       Future Appointments             In 2 months GiJerrol Banana MD BuB[facility] CenterPEC

## 2020-11-24 NOTE — Telephone Encounter (Signed)
Notes to clinic:  looks like there have been several attempts to change or corrected this script These are the alternatives that are requested  Exenatide ER (BYDUREON) 2 MG PEN Exenatide ER (BYDUREON BCISE) 2 MG/0.85ML AUIJ metFORMIN (GLUCOPHAGE) 500 MG tablet  Requested Prescriptions  Pending Prescriptions Disp Refills   BYDUREON 2 MG PEN [Pharmacy Med Name: BYDUREON 2 MG PEN INJECT]  0      Endocrinology:  Diabetes - GLP-1 Receptor Agonists - exenatide Failed - 11/24/2020  1:41 PM      Failed - HBA1C is between 0 and 7.9 and within 180 days    Hgb A1c MFr Bld  Date Value Ref Range Status  11/18/2020 9.3 (H) 4.8 - 5.6 % Final    Comment:             Prediabetes: 5.7 - 6.4          Diabetes: >6.4          Glycemic control for adults with diabetes: <7.0           Passed - Cr in normal range and within 360 days    Creat  Date Value Ref Range Status  05/23/2017 0.77 0.70 - 1.25 mg/dL Final    Comment:    For patients >53 years of age, the reference limit for Creatinine is approximately 13% higher for people identified as African-American. .    Creatinine, Ser  Date Value Ref Range Status  11/18/2020 0.93 0.76 - 1.27 mg/dL Final          Passed - eGFR in normal range and within 360 days    GFR, Est African American  Date Value Ref Range Status  05/23/2017 114 > OR = 60 mL/min/1.53m2 Final   GFR calc Af Amer  Date Value Ref Range Status  12/18/2019 96 >59 mL/min/1.73 Final    Comment:    **[facility] currently reports eGFR in compliance with the current**   recommendations of the [employer]. [facility] will   update reporting as new guidelines are published from the NKF-ASN   Task force.    GFR, Est Non African American  Date Value Ref Range Status  05/23/2017 99 > OR = 60 mL/min/1.62m2 Final   GFR calc non Af Amer  Date Value Ref Range Status  12/18/2019 83 >59 mL/min/1.73 Final   eGFR  Date Value Ref Range Status  11/18/2020 92 >59  mL/min/1.73 Final          Passed - Valid encounter within last 6 months    Recent Outpatient Visits           1 week ago Annual physical exam   [facility] Maple Hudson., MD   5 months ago Poorly controlled type 2 diabetes mellitus [facility])   [facility] Maple Hudson., MD   7 months ago Poorly controlled type 2 diabetes mellitus [facility])   [facility] Maple Hudson., MD   9 months ago Type 2 diabetes mellitus with other specified complication, without long-term current use of insulin [facility])   [facility] Maple Hudson., MD   10 months ago Controlled type 2 diabetes mellitus without complication, without long-term current use of insulin [facility])   [facility] Maple Hudson., MD       Future Appointments             In 2 months Maple Hudson., MD [facility]  [facility]ractice, [facility]

## 2020-11-24 NOTE — Telephone Encounter (Signed)
Sent!

## 2020-11-24 NOTE — Telephone Encounter (Signed)
Please change again to Trulicity 6.16 weekly [email or web address]

## 2020-11-25 ENCOUNTER — Telehealth: Payer: Self-pay | Admitting: *Deleted

## 2020-11-25 NOTE — Telephone Encounter (Signed)
Copied from [place] [phone]. Topic: Quick Communication - Rx Refill/Question >> Nov 25, 2020  5:06 PM Erick Blinks wrote: Art from [facility] called requesting to speak to PCP, regarding patient's Ozempic.  [id number] [place] [place] - [address] [place] [address] Phone: [phone] Fax: [phone]  Art is requesting that the office resends a Rx for Ozempic, he says the pharmacy denies having it for the patient. Please advise Best contact: [phone]   Per note of 11/21/20:   Patient is calling back regarding his new Rx:  Semaglutide,0.25 or 0.5MG /DOS, (OZEMPIC, 0.25 OR 0.5 MG/DOSE,) 2 MG/1.5ML SOPN Patient states he has contacted the pharmacy and they state they are still working on it- it has been 10 days. Patient thinks the office may have to contact his insurance like they did for the previous Rx.  Call to pharmacy- not covered by insurance- will need prior authorization for coverage.  Please advise

## 2020-11-26 ENCOUNTER — Other Ambulatory Visit: Payer: Self-pay

## 2020-11-26 MED ORDER — OZEMPIC (0.25 OR 0.5 MG/DOSE) 2 MG/1.5ML ~~LOC~~ SOPN: PEN_INJECTOR | SUBCUTANEOUS | 3 refills | Status: DC

## 2020-11-26 NOTE — Telephone Encounter (Signed)
It looks like he is on Trulicity and Ozempic, is this correct?  If so I can send in the Ozzempic again for you

## 2020-11-26 NOTE — Telephone Encounter (Signed)
Keep on getting messages from insurance or pharmacy that 1 is covered and not the other.  He should not be on both just whichever one is covered by insurance.

## 2020-12-22 ENCOUNTER — Other Ambulatory Visit: Payer: Self-pay | Admitting: Family Medicine

## 2020-12-22 DIAGNOSIS — E119 Type 2 diabetes mellitus without complications: Secondary | ICD-10-CM

## 2020-12-23 NOTE — Telephone Encounter (Signed)
  Notes to clinic:  Medication requested was d/c  Review for continued use   Requested Prescriptions  Pending Prescriptions Disp Refills   TRULICITY 1.5 GE/9.5MW SOPN [Pharmacy Med Name: TRULICITY 1.5 UX/3.2 ML PEN]  4    Sig: Inject 1.5 mg into the skin once a week.      Endocrinology:  Diabetes - GLP-1 Receptor Agonists Failed - 12/22/2020 12:08 PM      Failed - HBA1C is between 0 and 7.9 and within 180 days    Hgb A1c MFr Bld  Date Value Ref Range Status  11/18/2020 9.3 (H) 4.8 - 5.6 % Final    Comment:             Prediabetes: 5.7 - 6.4          Diabetes: >6.4          Glycemic control for adults with diabetes: <7.0           Passed - Valid encounter within last 6 months    Recent Outpatient Visits           1 month ago Annual physical exam   [facility] Jerrol Banana., MD   6 months ago Poorly controlled type 2 diabetes mellitus [facility])   [facility] Jerrol Banana., MD   8 months ago Poorly controlled type 2 diabetes mellitus [facility])   [facility] Jerrol Banana., MD   10 months ago Type 2 diabetes mellitus with other specified complication, without long-term current use of insulin [facility])   [facility] Jerrol Banana., MD   11 months ago Controlled type 2 diabetes mellitus without complication, without long-term current use of insulin [facility])   [facility] Jerrol Banana., MD       Future Appointments             In 1 month Jerrol Banana., MD [facility], [facility]

## 2020-12-25 ENCOUNTER — Other Ambulatory Visit: Payer: Self-pay | Admitting: Family Medicine

## 2020-12-25 ENCOUNTER — Other Ambulatory Visit: Payer: Self-pay | Admitting: Cardiovascular Disease

## 2020-12-25 DIAGNOSIS — E785 Hyperlipidemia, unspecified: Secondary | ICD-10-CM

## 2020-12-27 ENCOUNTER — Other Ambulatory Visit: Payer: Self-pay | Admitting: Family Medicine

## 2020-12-27 DIAGNOSIS — E119 Type 2 diabetes mellitus without complications: Secondary | ICD-10-CM

## 2020-12-27 NOTE — Telephone Encounter (Signed)
Last RF 09/10/20

## 2020-12-31 ENCOUNTER — Other Ambulatory Visit: Payer: Self-pay

## 2020-12-31 ENCOUNTER — Ambulatory Visit (INDEPENDENT_AMBULATORY_CARE_PROVIDER_SITE_OTHER): Payer: Managed Care, Other (non HMO)

## 2020-12-31 DIAGNOSIS — Z952 Presence of prosthetic heart valve: Secondary | ICD-10-CM | POA: Diagnosis not present

## 2020-12-31 DIAGNOSIS — I350 Nonrheumatic aortic (valve) stenosis: Secondary | ICD-10-CM | POA: Diagnosis not present

## 2020-12-31 LAB — ECHOCARDIOGRAM COMPLETE
AR max vel: 2.47 cm2
AV Area VTI: 2.55 cm2
AV Area mean vel: 2.7 cm2
AV Mean grad: 10.7 mmHg
AV Peak grad: 20.3 mmHg
Ao pk vel: 2.25 m/s
Area-P 1/2: 3.66 cm2
MV VTI: 3.45 cm2
S' Lateral: 2.5 cm

## 2020-12-31 MED ORDER — PERFLUTREN LIPID MICROSPHERE
1.0000 mL | INTRAVENOUS | Status: AC | PRN
  Administered 2020-12-31: 3 mL via INTRAVENOUS

## 2021-01-05 ENCOUNTER — Telehealth: Payer: Self-pay

## 2021-01-05 NOTE — Telephone Encounter (Signed)
Left detail message on VM of pt's recent results okay by DPR, Dr. Rockey Situ advised   "echo  Normal cardiac function  Stable prosthetic valve"  At this time, no further recommendations or medications changes, advised to call office for any concerns or questions, otherwise will see at next visit.

## 2021-01-13 ENCOUNTER — Other Ambulatory Visit: Payer: Self-pay | Admitting: Family Medicine

## 2021-01-13 DIAGNOSIS — R252 Cramp and spasm: Secondary | ICD-10-CM

## 2021-02-05 ENCOUNTER — Other Ambulatory Visit: Payer: Self-pay | Admitting: Family Medicine

## 2021-02-05 DIAGNOSIS — E119 Type 2 diabetes mellitus without complications: Secondary | ICD-10-CM

## 2021-02-05 DIAGNOSIS — R252 Cramp and spasm: Secondary | ICD-10-CM

## 2021-02-05 NOTE — Telephone Encounter (Signed)
Requested medication (s) are due for refill today: expired medication and one discontinued historical medicaiton  Requested medication (s) are on the active medication list: yes  Last refill:  12/19/19 #30 6 refills  Future visit scheduled: yes in 6 days   Notes to clinic:  medication not assigned to a protocol. Nystatin cream expired. Magnesium discontinued 02/13/20 . Last labs mag. Level 11/27/19     Requested Prescriptions  Pending Prescriptions Disp Refills   magnesium oxide (MAG-OX) 400 (240 Mg) MG tablet [Pharmacy Med Name: MAGNESIUM OXIDE 400 MG TABLET] 180 tablet 3    Sig: TAKE 1 TABLET BY MOUTH TWICE A DAY     Endocrinology:  Minerals - Magnesium Supplementation Failed - 02/05/2021  4:01 PM      Failed - Mg Level in normal range and within 360 days    Magnesium  Date Value Ref Range Status  11/27/2019 2.2 1.7 - 2.4 mg/dL Final    Comment:    Performed at [facility], [place] [address] [place], [place] [address]          Passed - Valid encounter within last 12 months    Recent Outpatient Visits           2 months ago Annual physical exam   [facility] Jerrol Banana., MD   8 months ago Poorly controlled type 2 diabetes mellitus [facility])   [facility] Ban[facility] MD   10 months ago Poorly controlled type 2 diabetes mellitus [facility])   [facility] Jerrol Banana., MD   11 months ago Type 2 diabetes mellitus with other specified complication, without long-term current use of insulin [facility])   [facility] Jerrol Banana., MD   1 year ago Controlled type 2 diabetes mellitus without complication, without long-term current use of insulin [facility])   [facility] Jerrol Banana., MD       Future Appointments             In 6 days Jerrol Banana., MD [facility], [facility]             nystatin cream (MYCOSTATIN) [Pharmacy Med Name: NYSTATIN  100,000 UNIT/GM CREAM] 30 g 6    Sig: APPLY TO AFFECTED AREA TWICE A DAY     Off-Protocol Failed - 02/05/2021  4:01 PM      Failed - Medication not assigned to a protocol, review manually.      Passed - Valid encounter within last 12 months    Recent Outpatient Visits           2 months ago Annual physical exam   [facility] Jerrol Banana., MD   8 months ago Poorly controlled type 2 diabetes mellitus [facility])   [facility] Jerrol Banana., MD   10 months ago Poorly controlled type 2 diabetes mellitus [facility])   [facility] Jerrol Banana., MD   11 months ago Type 2 diabetes mellitus with other specified complication, without long-term current use of insulin [facility])   [facility] Jerrol Banana., MD   1 year ago Controlled type 2 diabetes mellitus without complication, without long-term current use of insulin [facility])   [facility] Jerrol Banana., MD       Future Appointments             In 6 days Miguel Aschoff  Kaylyn Lim., MD [facility], [facility]            Signed Prescriptions Disp Refills   metFORMIN (GLUCOPHAGE) 1000 MG tablet 180 tablet 0    Sig: TAKE 1 TABLET (1,000 MG TOTAL) BY MOUTH 2 (TWO) TIMES DAILY WITH A MEAL.     Endocrinology:  Diabetes - Biguanides Failed - 02/05/2021  4:01 PM      Failed - HBA1C is between 0 and 7.9 and within 180 days    Hgb A1c MFr Bld  Date Value Ref Range Status  11/18/2020 9.3 (H) 4.8 - 5.6 % Final    Comment:             Prediabetes: 5.7 - 6.4          Diabetes: >6.4          Glycemic control for adults with diabetes: <7.0           Passed - Cr in normal range and within 360 days    Creat  Date Value Ref Range Status  05/23/2017 0.77 0.70 - 1.25 mg/dL Final    Comment:    For patients >72 years of age, the reference limit for Creatinine is approximately 13% higher for people identified as African-American. .     Creatinine, Ser  Date Value Ref Range Status  11/18/2020 0.93 0.76 - 1.27 mg/dL Final          Passed - eGFR in normal range and within 360 days    GFR, Est African American  Date Value Ref Range Status  05/23/2017 114 > OR = 60 mL/min/1.64m Final   GFR calc Af Amer  Date Value Ref Range Status  12/18/2019 96 >59 mL/min/1.73 Final    Comment:    **[facility] currently reports eGFR in compliance with the current**   recommendations of the [employer] [facility] will   update reporting as new guidelines are published from the NKF-ASN   Task force.    GFR, Est Non African American  Date Value Ref Range Status  05/23/2017 99 > OR = 60 mL/min/1.761mFinal   GFR calc non Af Amer  Date Value Ref Range Status  12/18/2019 83 >59 mL/min/1.73 Final   eGFR  Date Value Ref Range Status  11/18/2020 92 >59 mL/min/1.73 Final          Passed - Valid encounter within last 6 months    Recent Outpatient Visits           2 months ago Annual physical exam   [facility]   8 months ago Poorly controlled type 2 diabetes mellitus ([facility]  [facility] MD   10 months ago Poorly controlled type 2 diabetes mellitus ([facility]  [facility]rol Banana MD   11 months ago Type 2 diabetes mellitus with other specified complication, without long-term current use of insulin ([facility]  Bu[facility] Ban[facility]   1 year ago Controlled type 2 diabetes mellitus without complication, without long-term current use of insulin ([facility]  Bu[facility]Jer[facility] MD       Future Appointments             In 6 days GiJerrol Banana MD [facility]

## 2021-02-05 NOTE — Telephone Encounter (Signed)
Future visit in 6 days  

## 2021-02-10 NOTE — Progress Notes (Signed)
Established patient visit   Patient: Jeffery West   DOB: 13-Mar-1956   65 y.o. Male  MRN: [id number] Visit Date: 02/11/2021  Today's healthcare provider: [name], MD   Chief Complaint  Patient presents with   Diabetes   Hypertension   Hyperlipidemia   Subjective  -------------------------------------------------------------------------------------------------------------------- HPI  Patient is tolerating Ozempic at 0.25 weekly.  He has not gained weight despite not working on diet and exercise at all.  He does not check his sugars at home. He complains of some ongoing back pain which limits him with yard work.  He states it also limits his exercise. He has no cardiovascular symptoms. Diabetes Mellitus Type II, follow-up  Lab Results  Component Value Date   HGBA1C 9.3 (H) 11/18/2020   HGBA1C 7.8 (A) 06/10/2020   HGBA1C 8.2 (A) 02/26/2020   Last seen for diabetes 3 months ago.  Management since then includes; No insurance change [employer] to [employer].  Consider Jardiance or [place]. He reports good compliance with treatment. He is not having side effects.   Home blood sugar records:  not being checked  Episodes of hypoglycemia? No    Current insulin regiment: Ozempic  Most Recent Eye Exam: in 03/2021.   --------------------------------------------------------------------------------------------------- Hypertension, follow-up  BP Readings from Last 3 Encounters:  02/11/21 (!) 132/59  11/12/20 140/72  11/12/20 140/70   Wt Readings from Last 3 Encounters:  02/11/21 261 lb (118.4 kg)  11/12/20 262 lb 12.8 oz (119.2 kg)  11/12/20 261 lb 2 oz (118.4 kg)     He was last seen for hypertension 3 months ago.  BP at that visit was 142/70. Management since that visit includes; good control. He reports good compliance with treatment. He is not having side effects.  He is not exercising. He is adherent to low salt diet.   Outside blood pressures are  checked occasionally.  He does not smoke.  Use of agents associated with hypertension: none.   --------------------------------------------------------------------------------------------------- Lipid/Cholesterol, follow-up  Last Lipid Panel: Lab Results  Component Value Date   CHOL 159 11/18/2020   LDLCALC 72 11/18/2020   HDL 34 (L) 11/18/2020   TRIG 330 (H) 11/18/2020    He was last seen for this 3 months ago.  Management since that visit includes; labs checked showing-stable. Advised to continue to work on diet and exercise.  He reports good compliance with treatment. He is not having side effects.   He is following a Regular diet. Current exercise: no regular exercise  Last metabolic panel Lab Results  Component Value Date   GLUCOSE 207 (H) 11/18/2020   NA 139 11/18/2020   K 4.9 11/18/2020   BUN 16 11/18/2020   CREATININE 0.93 11/18/2020   GFRNONAA 83 12/18/2019   GFRAA 96 12/18/2019   CALCIUM 9.7 11/18/2020   AST 21 11/18/2020   ALT 27 11/18/2020   The 10-year ASCVD risk score Mikey Bussing DC Jr., et al., 2013) is: 25.7%      Medications: Outpatient Medications Prior to Visit  Medication Sig   acetaminophen (TYLENOL) 500 MG tablet Take 1,000 mg by mouth every 6 (six) hours as needed for moderate pain or headache.   albuterol (PROAIR HFA) 108 (90 Base) MCG/ACT inhaler Inhale 1 puff into the lungs every 6 (six) hours as needed for wheezing or shortness of breath.   allopurinol (ZYLOPRIM) 300 MG tablet TAKE 1 TABLET BY MOUTH EVERY DAY   aspirin EC 81 MG tablet Take 81 mg by mouth daily. Swallow  whole.   BD PEN NEEDLE NANO 2ND GEN 32G X 4 MM MISC USE AS DIRECTED   Cholecalciferol (VITAMIN D) 2000 units CAPS Take 2,000 Units by mouth every evening.    Dulaglutide (TRULICITY) 1.5 0000000 SOPN INJECT 1.5 MG INTO THE SKIN ONCE A WEEK.   ezetimibe (ZETIA) 10 MG tablet TAKE 1 TABLET BY MOUTH EVERY DAY   glucose blood (ONETOUCH VERIO) test strip Use as instructed; BID    insulin degludec (TRESIBA) 100 UNIT/ML FlexTouch Pen INJECT 25 UNITS INTO THE SKIN DAILY AT 10 PM.   Insulin Syringe-Needle U-100 30G X 1/2" 1 ML MISC Use with insulin 3 times daily (Patient not taking: Reported on 11/12/2020)   losartan (COZAAR) 50 MG tablet TAKE 1 TABLET BY MOUTH EVERY DAY   magnesium oxide (MAG-OX) 400 (240 Mg) MG tablet TAKE 1 TABLET BY MOUTH TWICE A DAY   magnesium oxide (MAG-OX) 400 MG tablet Take 400 mg by mouth 2 (two) times daily.    metFORMIN (GLUCOPHAGE) 1000 MG tablet TAKE 1 TABLET (1,000 MG TOTAL) BY MOUTH 2 (TWO) TIMES DAILY WITH A MEAL.   metoprolol tartrate (LOPRESSOR) 25 MG tablet Take 1 tablet (25 mg total) by mouth 2 (two) times daily.   nystatin cream (MYCOSTATIN) APPLY TO AFFECTED AREA TWICE A DAY   OneTouch Delica Lancets 99991111 MISC Use 3 times a day   rosuvastatin (CRESTOR) 5 MG tablet TAKE 1 TABLET BY MOUTH EVERY DAY   sildenafil (REVATIO) 20 MG tablet Take 1 tablet (20 mg total) by mouth 3 (three) times daily as needed.   tamsulosin (FLOMAX) 0.4 MG CAPS capsule TAKE 1 CAPSULE BY MOUTH EVERY DAY   traMADol (ULTRAM) 50 MG tablet Take 50 mg by mouth every 6 (six) hours as needed.   No facility-administered medications prior to visit.    Review of Systems  Constitutional:  Negative for appetite change, chills and fever.  Respiratory:  Negative for chest tightness, shortness of breath and wheezing.   Cardiovascular:  Negative for chest pain and palpitations.  Gastrointestinal:  Negative for abdominal pain, nausea and vomiting.   Last hemoglobin A1c Lab Results  Component Value Date   HGBA1C 9.3 (H) 11/18/2020       Objective  -------------------------------------------------------------------------------------------------------------------- BP (!) 132/59   Pulse 63   Temp 98.5 F (36.9 C)   Resp 16   Ht '5\' 11"'$  (1.803 m)   Wt 261 lb (118.4 kg)   BMI 36.40 kg/m  BP Readings from Last 3 Encounters:  02/11/21 (!) 132/59  11/12/20 140/72   11/12/20 140/70   Wt Readings from Last 3 Encounters:  02/11/21 261 lb (118.4 kg)  11/12/20 262 lb 12.8 oz (119.2 kg)  11/12/20 261 lb 2 oz (118.4 kg)       Physical Exam Vitals reviewed.  Constitutional:      Appearance: He is obese.  HENT:     Head: Normocephalic and atraumatic.  Eyes:     General: No scleral icterus.    Conjunctiva/sclera: Conjunctivae normal.  Cardiovascular:     Rate and Rhythm: Normal rate and regular rhythm.     Pulses: Normal pulses.     Heart sounds: Normal heart sounds.  Pulmonary:     Effort: Pulmonary effort is normal.     Breath sounds: Normal breath sounds.  Abdominal:     Palpations: Abdomen is soft.  Musculoskeletal:     Right lower leg: No edema.     Left lower leg: No edema.  Comments: He has full range of motion in his lower back.  Lymphadenopathy:     Cervical: No cervical adenopathy.  Skin:    General: Skin is warm and dry.  Neurological:     General: No focal deficit present.     Mental Status: He is alert and oriented to person, place, and time.  Psychiatric:        Mood and Affect: Mood normal.        Behavior: Behavior normal.        Thought Content: Thought content normal.      No results found for any visits on 02/11/21.  Assessment & Plan  ---------------------------------------------------------------------------------------------------------------------- 1. Type 2 diabetes mellitus with other specified complication, without long-term current use of insulin (HCC) Obtain A1c in the next 1 to 2 weeks as it is a week or 2 early to do so today. Increase Ozempic to 0.5 mg weekly. - Hemoglobin A1c  2. Mixed hyperlipidemia Patient really needs to work on diet and exercise.  Triglycerides were above 300 last time.  Consider Vascepa  3. Primary hypertension Good control  4. Nonrheumatic aortic valve stenosis   5. Poorly controlled type 2 diabetes mellitus ([place])   6. CAD in native artery All risk factors  treated but patient is not working on lifestyle at all.  7. Hx of CABG   8. Morbid obesity ([place]) Diet and exercise stressed to patient.   9. S/P AVR (aortic valve replacement)    No follow-ups on file.      I, [name], MD, have reviewed all documentation for this visit. The documentation on 02/11/21 for the exam, diagnosis, procedures, and orders are all accurate and complete.    Sevanna Ballengee Cranford Mon, MD  [facility] [phone] (phone) [phone] (fax)  [employer]

## 2021-02-11 ENCOUNTER — Encounter: Payer: Self-pay | Admitting: Family Medicine

## 2021-02-11 ENCOUNTER — Ambulatory Visit (INDEPENDENT_AMBULATORY_CARE_PROVIDER_SITE_OTHER): Payer: Managed Care, Other (non HMO) | Admitting: Family Medicine

## 2021-02-11 ENCOUNTER — Other Ambulatory Visit: Payer: Self-pay

## 2021-02-11 VITALS — BP 132/59 | HR 63 | Temp 98.5°F | Resp 16 | Ht 71.0 in | Wt 261.0 lb

## 2021-02-11 DIAGNOSIS — E1165 Type 2 diabetes mellitus with hyperglycemia: Secondary | ICD-10-CM

## 2021-02-11 DIAGNOSIS — I10 Essential (primary) hypertension: Secondary | ICD-10-CM

## 2021-02-11 DIAGNOSIS — E1169 Type 2 diabetes mellitus with other specified complication: Secondary | ICD-10-CM | POA: Diagnosis not present

## 2021-02-11 DIAGNOSIS — I350 Nonrheumatic aortic (valve) stenosis: Secondary | ICD-10-CM | POA: Diagnosis not present

## 2021-02-11 DIAGNOSIS — E782 Mixed hyperlipidemia: Secondary | ICD-10-CM

## 2021-02-11 DIAGNOSIS — Z951 Presence of aortocoronary bypass graft: Secondary | ICD-10-CM

## 2021-02-11 DIAGNOSIS — Z952 Presence of prosthetic heart valve: Secondary | ICD-10-CM

## 2021-02-11 DIAGNOSIS — I2510 Atherosclerotic heart disease of native coronary artery without angina pectoris: Secondary | ICD-10-CM

## 2021-03-05 LAB — HEMOGLOBIN A1C
Est. average glucose Bld gHb Est-mCnc: 220 mg/dL
Hgb A1c MFr Bld: 9.3 % — ABNORMAL HIGH (ref 4.8–5.6)

## 2021-03-17 ENCOUNTER — Ambulatory Visit: Payer: Self-pay | Admitting: *Deleted

## 2021-03-17 NOTE — Telephone Encounter (Signed)
Patient returned called . Pt given lab results per notes of Dr. Rosanna Randy from 03/11/21 on 03/17/21. Pt verbalized understanding.

## 2021-03-24 IMAGING — DX DG CHEST 1V PORT
1 series · 1 of 1 positions shown · non-contrast
Comparison: November 22, 2019

CLINICAL DATA: Hypoxia. Status post coronary artery bypass grafting

EXAM:
PORTABLE CHEST 1 VIEW

[chest]
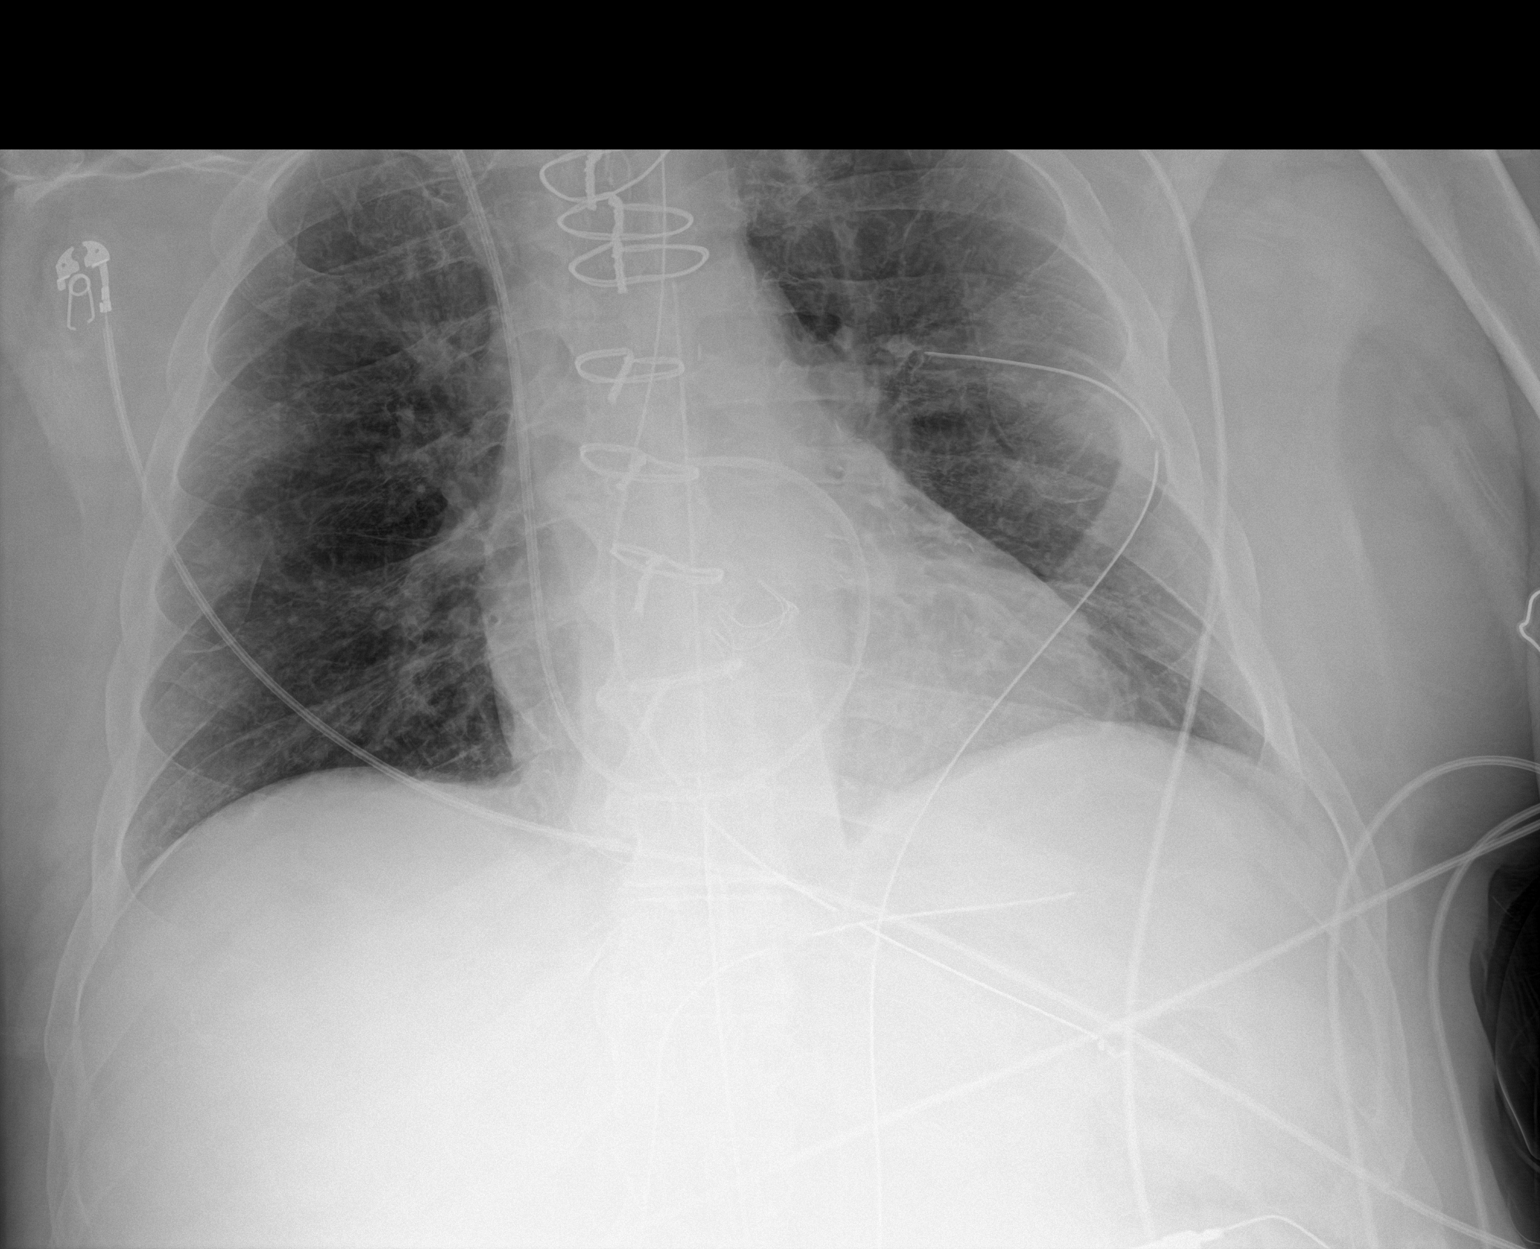

[1 of 1 positions shown; findings below may reference images not displayed]

FINDINGS: Endotracheal tube tip is 5.6 cm above the carina. Nasogastric tube
tip and side port are below the diaphragm. Swan-Ganz catheter tip is
in the right main pulmonary artery. There is a left chest tube and a
mediastinal drain. No pneumothorax.

Lungs clear. Heart size is normal with pulmonary vascularity normal
in appearance. Prosthetic valve present. No adenopathy. No bone
lesions.
IMPRESSION: Tube and catheter positions as described without pneumothorax.
Postoperative changes. Lungs clear. Heart size normal.

## 2021-03-26 LAB — HM DIABETES EYE EXAM

## 2021-03-27 ENCOUNTER — Other Ambulatory Visit: Payer: Self-pay | Admitting: Family

## 2021-03-27 ENCOUNTER — Other Ambulatory Visit: Payer: Self-pay | Admitting: Family Medicine

## 2021-03-27 DIAGNOSIS — I350 Nonrheumatic aortic (valve) stenosis: Secondary | ICD-10-CM

## 2021-03-27 DIAGNOSIS — Z952 Presence of prosthetic heart valve: Secondary | ICD-10-CM

## 2021-03-27 DIAGNOSIS — I25118 Atherosclerotic heart disease of native coronary artery with other forms of angina pectoris: Secondary | ICD-10-CM

## 2021-03-27 NOTE — Telephone Encounter (Signed)
Rx(s) sent to pharmacy electronically.  

## 2021-03-28 NOTE — Telephone Encounter (Signed)
Requested medications are due for refill today yes  Requested medications are on the active medication list yes  Last refill 01/09/21  Last visit 02/11/21  Future visit scheduled 07/15/21  Notes to clinic No uric acid within 360 days, please assess.

## 2021-04-02 ENCOUNTER — Other Ambulatory Visit: Payer: Self-pay | Admitting: Family Medicine

## 2021-04-03 NOTE — Telephone Encounter (Signed)
Requested medication (s) are due for refill today: yes  Requested medication (s) are on the active medication list: yes  Last refill:  11/26/20  Future visit scheduled: yes  Notes to clinic:  01/25/21 dated ended   Requested Prescriptions  Pending Prescriptions Disp Refills   OZEMPIC, 0.25 OR 0.5 MG/DOSE, 2 MG/1.5ML SOPN [Pharmacy Med Name: OZEMPIC 0.25-0.5 MG/DOSE PEN]  1    Sig: INJECT 0.25 MG INTO THE SKIN ONCE A WEEK FOR 30 DAYS, THEN 0.5 MG ONCE A WEEK.     Endocrinology:  Diabetes - GLP-1 Receptor Agonists Failed - 04/02/2021 12:31 PM      Failed - HBA1C is between 0 and 7.9 and within 180 days    Hgb A1c MFr Bld  Date Value Ref Range Status  03/04/2021 9.3 (H) 4.8 - 5.6 % Final    Comment:             Prediabetes: 5.7 - 6.4          Diabetes: >6.4          Glycemic control for adults with diabetes: <7.0           Passed - Valid encounter within last 6 months    Recent Outpatient Visits           1 month ago Type 2 diabetes mellitus with other specified complication, without long-term current use of insulin [facility])   [facility] Jerrol Banana., MD   4 months ago Annual physical exam   [facility] Jerrol Banana., MD   9 months ago Poorly controlled type 2 diabetes mellitus [facility])   [facility] Jerrol Banana., MD   1 year ago Poorly controlled type 2 diabetes mellitus [facility])   [facility] Jerrol Banana., MD   1 year ago Type 2 diabetes mellitus with other specified complication, without long-term current use of insulin [facility])   [facility] Jerrol Banana., MD       Future Appointments             In 3 months Jerrol Banana., MD [facility], [facility]

## 2021-05-18 ENCOUNTER — Other Ambulatory Visit: Payer: Self-pay | Admitting: Family Medicine

## 2021-05-18 DIAGNOSIS — E119 Type 2 diabetes mellitus without complications: Secondary | ICD-10-CM

## 2021-05-28 ENCOUNTER — Other Ambulatory Visit: Payer: Self-pay | Admitting: Family Medicine

## 2021-05-28 DIAGNOSIS — E1169 Type 2 diabetes mellitus with other specified complication: Secondary | ICD-10-CM

## 2021-05-29 NOTE — Telephone Encounter (Signed)
Requested medication (s) are due for refill today: NO, dose inconsistent with current med list  Requested medication (s) are on the active medication list: no  Last refill:  03/05/21  Future visit scheduled: 07/15/21  Notes to clinic:  Historical Provider, dose inconsistent, please assess.   Requested Prescriptions  Pending Prescriptions Disp Refills   TRESIBA FLEXTOUCH 100 UNIT/ML FlexTouch Pen [Pharmacy Med Name: TRESIBA FLEXTOUCH 100 UNIT/ML]  29    Sig: INJECT 30 UNITS INTO THE SKIN DAILY AT 10 PM.     Endocrinology:  Diabetes - Insulins Failed - 05/28/2021 11:46 PM      Failed - HBA1C is between 0 and 7.9 and within 180 days    Hgb A1c MFr Bld  Date Value Ref Range Status  03/04/2021 9.3 (H) 4.8 - 5.6 % Final    Comment:             Prediabetes: 5.7 - 6.4          Diabetes: >6.4          Glycemic control for adults with diabetes: <7.0           Passed - Valid encounter within last 6 months    Recent Outpatient Visits           3 months ago Type 2 diabetes mellitus with other specified complication, without long-term current use of insulin [facility])   [facility] Jerrol Banana., MD   6 months ago Annual physical exam   [facility] Jerrol Banana., MD   11 months ago Poorly controlled type 2 diabetes mellitus [facility])   [facility] Jerrol Banana., MD   1 year ago Poorly controlled type 2 diabetes mellitus [facility])   [facility] Jerrol Banana., MD   1 year ago Type 2 diabetes mellitus with other specified complication, without long-term current use of insulin [facility])   [facility] Jerrol Banana., MD       Future Appointments             In 1 month Jerrol Banana., MD [facility], [facility]

## 2021-07-10 ENCOUNTER — Other Ambulatory Visit: Payer: Self-pay | Admitting: Family Medicine

## 2021-07-10 DIAGNOSIS — I10 Essential (primary) hypertension: Secondary | ICD-10-CM

## 2021-07-10 NOTE — Telephone Encounter (Signed)
Requested Prescriptions  Pending Prescriptions Disp Refills   losartan (COZAAR) 50 MG tablet [Pharmacy Med Name: LOSARTAN POTASSIUM 50 MG TAB] 90 tablet 0    Sig: TAKE 1 TABLET BY MOUTH EVERY DAY     Cardiovascular:  Angiotensin Receptor Blockers Failed - 07/10/2021  1:21 AM      Failed - Cr in normal range and within 180 days    Creat  Date Value Ref Range Status  05/23/2017 0.77 0.70 - 1.25 mg/dL Final    Comment:    For patients >65 years of age, the reference limit for Creatinine is approximately 13% higher for people identified as African-American. .    Creatinine, Ser  Date Value Ref Range Status  11/18/2020 0.93 0.76 - 1.27 mg/dL Final         Failed - K in normal range and within 180 days    Potassium  Date Value Ref Range Status  11/18/2020 4.9 3.5 - 5.2 mmol/L Final         Passed - Patient is not pregnant      Passed - Last BP in normal range    BP Readings from Last 1 Encounters:  02/11/21 (!) 132/59         Passed - Valid encounter within last 6 months    Recent Outpatient Visits          4 months ago Type 2 diabetes mellitus with other specified complication, without long-term current use of insulin ([place])   [facility] Jerrol Banana[facility] MD   8 months ago Annual physical exam   [facility] Jerrol Banana., MD   1 year ago Poorly controlled type 2 diabetes mellitus [facility])   [facility] Jerrol Banana., MD   1 year ago Poorly controlled type 2 diabetes mellitus [facility])   [facility] Jerrol Banana., MD   1 year ago Type 2 diabetes mellitus with other specified complication, without long-term current use of insulin [facility])   [facility] Jerrol Banana., MD      Future Appointments            In 5 days Rumball, Jake Church, [place], [place]

## 2021-07-15 ENCOUNTER — Other Ambulatory Visit: Payer: Self-pay

## 2021-07-15 ENCOUNTER — Encounter: Payer: Self-pay | Admitting: Family Medicine

## 2021-07-15 ENCOUNTER — Ambulatory Visit (INDEPENDENT_AMBULATORY_CARE_PROVIDER_SITE_OTHER): Payer: Managed Care, Other (non HMO) | Admitting: Family Medicine

## 2021-07-15 ENCOUNTER — Ambulatory Visit: Payer: Managed Care, Other (non HMO) | Admitting: Family Medicine

## 2021-07-15 VITALS — BP 115/71 | HR 83 | Temp 98.5°F | Resp 16 | Ht 71.0 in | Wt 256.0 lb

## 2021-07-15 DIAGNOSIS — I10 Essential (primary) hypertension: Secondary | ICD-10-CM

## 2021-07-15 DIAGNOSIS — M1991 Primary osteoarthritis, unspecified site: Secondary | ICD-10-CM

## 2021-07-15 DIAGNOSIS — E1165 Type 2 diabetes mellitus with hyperglycemia: Secondary | ICD-10-CM

## 2021-07-15 DIAGNOSIS — E782 Mixed hyperlipidemia: Secondary | ICD-10-CM

## 2021-07-15 DIAGNOSIS — Z114 Encounter for screening for human immunodeficiency virus [HIV]: Secondary | ICD-10-CM | POA: Diagnosis not present

## 2021-07-15 DIAGNOSIS — E1169 Type 2 diabetes mellitus with other specified complication: Secondary | ICD-10-CM

## 2021-07-15 DIAGNOSIS — N400 Enlarged prostate without lower urinary tract symptoms: Secondary | ICD-10-CM | POA: Diagnosis not present

## 2021-07-15 LAB — POCT GLYCOSYLATED HEMOGLOBIN (HGB A1C)
Est. average glucose Bld gHb Est-mCnc: 223
Hemoglobin A1C: 9.4 % — AB (ref 4.0–5.6)

## 2021-07-15 MED ORDER — TAMSULOSIN HCL 0.4 MG PO CAPS: 0.4000 mg | ORAL_CAPSULE | Freq: Every day | ORAL | 3 refills | Status: DC

## 2021-07-15 MED ORDER — EMPAGLIFLOZIN 10 MG PO TABS: 10.0000 mg | ORAL_TABLET | Freq: Every day | ORAL | 0 refills | Status: DC

## 2021-07-15 MED ORDER — TRESIBA FLEXTOUCH 100 UNIT/ML ~~LOC~~ SOPN: PEN_INJECTOR | SUBCUTANEOUS | 12 refills | Status: DC

## 2021-07-15 NOTE — Assessment & Plan Note (Signed)
Recheck labs today. 

## 2021-07-15 NOTE — Assessment & Plan Note (Signed)
Doing well on current regimen, no changes made today. 

## 2021-07-15 NOTE — Assessment & Plan Note (Signed)
Remains uncontrolled. Add jardiance. Discussed lifestyle modifications, diet and exercise, extensively today. F/u in 3 months for recheck.

## 2021-07-15 NOTE — Progress Notes (Signed)
SUBJECTIVE:   CHIEF COMPLAINT / HPI:   Hypertension, CAD s/p CABG, AS s/p AVR: - Medications: losartan, metoprolol, ASA, crestor - Compliance: good - Checking BP at home: no - Denies any SOB, CP, LE edema, medication SEs, or symptoms of hypotension - Diet: see below - Exercise: see below  Diabetes, Type 2 - Last A1c 9.3 02/2021 - Medications: ozempic 0.5, tresiba 25u, metformin 1000mg  BID - Compliance: good - Checking BG at home: no - Diet: eating lots of sweets, trying to eat sugar free. 2 pieces of whole wheat toast for breakfast, chicken salad for lunch, grapes, wheat thins. Banana, orange, lance crackers occasionally for snack. Wife cooks for dinner, grilled chicken salad. Water to drink. Sometimes unsweet tea with splenda.  - Exercise: none, bulging discs in back, arthritis and shoulder surgery makes challenging. Does walk at work.  - Eye exam: UTD - Foot exam: due - Statin: yes - PNA vaccine: due for PCV20 - Denies symptoms of hypoglycemia, polyuria, polydipsia, foot ulcers/trauma  HLD - medications: zetia, crestor - compliance: good - medication SEs: none  Benign Prostatic Hypertrophy - Medications: flomax. Not currently taking. Needs refill. - Symptoms: frequency, nocturia one time a night, and urgency    OBJECTIVE:   BP 115/71 (BP Location: Left Arm, Patient Position: Sitting, Cuff Size: Large)    Pulse 83    Temp 98.5 F (36.9 C) (Temporal)    Resp 16    Ht 5\' 11"  (1.803 m)    Wt 256 lb (116.1 kg)    SpO2 96%    BMI 35.70 kg/m   Gen: well appearing, obese, in NAD Card: RRR Lungs: CTAB Ext: WWP, no edema   ASSESSMENT/PLAN:   Problem List Items Addressed This Visit       Cardiovascular and Mediastinum   HTN (hypertension)    Doing well on current regimen, no changes made today.      Relevant Orders   Basic Metabolic Panel (BMET)     Endocrine   Poorly controlled type 2 diabetes mellitus ([place])    Remains uncontrolled. Add jardiance. Discussed  lifestyle modifications, diet and exercise, extensively today. F/u in 3 months for recheck.       Relevant Medications   OZEMPIC, 0.25 OR 0.5 MG/DOSE, 2 MG/1.5ML SOPN   empagliflozin (JARDIANCE) 10 MG TABS tablet   insulin degludec (TRESIBA FLEXTOUCH) 100 UNIT/ML FlexTouch Pen   Other Relevant Orders   POCT glycosylated hemoglobin (Hb A1C) (Completed)   Basic Metabolic Panel (BMET)   Lipid panel     Musculoskeletal and Integument   Arthritis, degenerative     Other   HLD (hyperlipidemia)    Recheck labs today.       Relevant Orders   Lipid panel   Morbid obesity ([place])    Contributing to HTN, DM2. Discussed lifestyle modifications extensively today.       Relevant Medications   OZEMPIC, 0.25 OR 0.5 MG/DOSE, 2 MG/1.5ML SOPN   empagliflozin (JARDIANCE) 10 MG TABS tablet   insulin degludec (TRESIBA FLEXTOUCH) 100 UNIT/ML FlexTouch Pen   Other Relevant Orders   POCT glycosylated hemoglobin (Hb A1C) (Completed)   Basic Metabolic Panel (BMET)   Lipid panel   Other Visit Diagnoses     Type 2 diabetes mellitus with other specified complication, without long-term current use of insulin ([place])    -  Primary   Relevant Medications   OZEMPIC, 0.25 OR 0.5 MG/DOSE, 2 MG/1.5ML SOPN   empagliflozin (JARDIANCE) 10 MG TABS tablet  insulin degludec (TRESIBA FLEXTOUCH) 100 UNIT/ML FlexTouch Pen   Other Relevant Orders   POCT glycosylated hemoglobin (Hb A1C) (Completed)   Basic Metabolic Panel (BMET)   Lipid panel   Screening for HIV (human immunodeficiency virus)       Relevant Orders   HIV antibody (with reflex)   Benign prostatic hyperplasia, unspecified whether lower urinary tract symptoms present       Relevant Medications   tamsulosin (FLOMAX) 0.4 MG CAPS capsule   Type 2 diabetes mellitus with other specified complication (HCC)       Relevant Medications   OZEMPIC, 0.25 OR 0.5 MG/DOSE, 2 MG/1.5ML SOPN   empagliflozin (JARDIANCE) 10 MG TABS tablet   insulin degludec (TRESIBA  FLEXTOUCH) 100 UNIT/ML FlexTouch Pen   Other Relevant Orders   POCT glycosylated hemoglobin (Hb A1C) (Completed)   Basic Metabolic Panel (BMET)   Lipid panel        Myles Gip, DO

## 2021-07-15 NOTE — Assessment & Plan Note (Signed)
Contributing to HTN, DM2. Discussed lifestyle modifications extensively today.

## 2021-07-16 ENCOUNTER — Ambulatory Visit: Payer: Self-pay | Admitting: *Deleted

## 2021-07-16 LAB — BASIC METABOLIC PANEL
BUN/Creatinine Ratio: 17 (ref 10–24)
BUN: 17 mg/dL (ref 8–27)
CO2: 24 mmol/L (ref 20–29)
Calcium: 10.1 mg/dL (ref 8.6–10.2)
Chloride: 95 mmol/L — ABNORMAL LOW (ref 96–106)
Creatinine, Ser: 0.99 mg/dL (ref 0.76–1.27)
Glucose: 322 mg/dL — ABNORMAL HIGH (ref 70–99)
Potassium: 5.3 mmol/L — ABNORMAL HIGH (ref 3.5–5.2)
Sodium: 135 mmol/L (ref 134–144)
eGFR: 85 mL/min/{1.73_m2} (ref >59)

## 2021-07-16 LAB — HIV ANTIBODY (ROUTINE TESTING W REFLEX): HIV Screen 4th Generation wRfx: NONREACTIVE

## 2021-07-16 LAB — LIPID PANEL
Chol/HDL Ratio: 4.1 ratio (ref 0.0–5.0)
Cholesterol, Total: 134 mg/dL (ref 100–199)
HDL: 33 mg/dL — ABNORMAL LOW (ref >39)
LDL Chol Calc (NIH): 60 mg/dL (ref 0–99)
Triglycerides: 254 mg/dL — ABNORMAL HIGH (ref 0–149)
VLDL Cholesterol Cal: 41 mg/dL — ABNORMAL HIGH (ref 5–40)

## 2021-07-16 NOTE — Telephone Encounter (Signed)
Pt given lab results per notes of A. Rumball, DO on 07/16/21. Pt verbalized understanding and would like to know if he is to take Vascepa in addition to his crestor and zetia he is already taking. Please advise .

## 2021-07-17 NOTE — Telephone Encounter (Signed)
Please advise 

## 2021-07-17 NOTE — Telephone Encounter (Signed)
Please send rx for vescepa?

## 2021-07-20 ENCOUNTER — Telehealth: Payer: Self-pay | Admitting: Family Medicine

## 2021-07-20 NOTE — Telephone Encounter (Signed)
Pt is calling about a pill that Dr. Ky Barban was to add because his triglycerides were re elevated. Pt did pick up the jardiance & flomax. Pt is unaware of the name of the suggested medication  Preferred [facility] [address], [place]  Please advise  [phone]

## 2021-07-21 MED ORDER — ICOSAPENT ETHYL 1 G PO CAPS: 2.0000 g | ORAL_CAPSULE | Freq: Two times a day (BID) | ORAL | 3 refills | Status: DC

## 2021-07-21 NOTE — Addendum Note (Signed)
Addended by: Myles Gip on: 07/21/2021 08:55 AM   Modules accepted: Orders

## 2021-07-21 NOTE — Telephone Encounter (Signed)
Please advise 

## 2021-08-05 ENCOUNTER — Other Ambulatory Visit: Payer: Self-pay | Admitting: Family Medicine

## 2021-08-05 DIAGNOSIS — E1169 Type 2 diabetes mellitus with other specified complication: Secondary | ICD-10-CM

## 2021-08-06 NOTE — Telephone Encounter (Signed)
Requested medications are due for refill today.  unsure  Requested medications are on the active medications list.  yes  Last refill. 07/05/2021  Future visit scheduled.   yes  Notes to clinic.  Medication listed as historical, with historical provider.    Requested Prescriptions  Pending Prescriptions Disp Refills   OZEMPIC, 0.25 OR 0.5 MG/DOSE, 2 MG/1.5ML SOPN [Pharmacy Med Name: OZEMPIC 0.25-0.5 MG/DOSE PEN]  3    Sig: Inject 0.5 mg into the skin once a week.     Endocrinology:  Diabetes - GLP-1 Receptor Agonists - semaglutide Failed - 08/05/2021 10:18 PM      Failed - HBA1C in normal range and within 180 days    Hemoglobin A1C  Date Value Ref Range Status  07/15/2021 9.4 (A) 4.0 - 5.6 % Final   Hgb A1c MFr Bld  Date Value Ref Range Status  03/04/2021 9.3 (H) 4.8 - 5.6 % Final    Comment:             Prediabetes: 5.7 - 6.4          Diabetes: >6.4          Glycemic control for adults with diabetes: <7.0           Passed - Cr in normal range and within 360 days    Creat  Date Value Ref Range Status  05/23/2017 0.77 0.70 - 1.25 mg/dL Final    Comment:    For patients >59 years of age, the reference limit for Creatinine is approximately 13% higher for people identified as African-American. .    Creatinine, Ser  Date Value Ref Range Status  07/15/2021 0.99 0.76 - 1.27 mg/dL Final          Passed - Valid encounter within last 6 months    Recent Outpatient Visits           3 weeks ago Type 2 diabetes mellitus with other specified complication, without long-term current use of insulin [facility])   [place], DO   5 months ago Type 2 diabetes mellitus with other specified complication, without long-term current use of insulin [facility])   [facility] Jerrol Banana., MD   8 months ago Annual physical exam   [facility] Jerrol Banana., MD   1 year ago Poorly controlled type 2 diabetes mellitus  [facility])   [facility] Jerrol Banana., MD   1 year ago Poorly controlled type 2 diabetes mellitus [facility])   [facility] Jerrol Banana., MD       Future Appointments             In 3 months Jerrol Banana., MD [facility]

## 2021-08-08 ENCOUNTER — Other Ambulatory Visit: Payer: Self-pay | Admitting: Family Medicine

## 2021-08-08 DIAGNOSIS — R252 Cramp and spasm: Secondary | ICD-10-CM

## 2021-08-08 DIAGNOSIS — E119 Type 2 diabetes mellitus without complications: Secondary | ICD-10-CM

## 2021-08-10 NOTE — Telephone Encounter (Signed)
Requested Prescriptions  Pending Prescriptions Disp Refills   metFORMIN (GLUCOPHAGE) 1000 MG tablet [Pharmacy Med Name: METFORMIN HCL 1,000 MG TABLET] 180 tablet 0    Sig: TAKE 1 TABLET (1,000 MG TOTAL) BY MOUTH 2 (TWO) TIMES DAILY WITH A MEAL.     Endocrinology:  Diabetes - Biguanides Failed - 08/08/2021 11:49 AM      Failed - HBA1C is between 0 and 7.9 and within 180 days    Hemoglobin A1C  Date Value Ref Range Status  07/15/2021 9.4 (A) 4.0 - 5.6 % Final   Hgb A1c MFr Bld  Date Value Ref Range Status  03/04/2021 9.3 (H) 4.8 - 5.6 % Final    Comment:             Prediabetes: 5.7 - 6.4          Diabetes: >6.4          Glycemic control for adults with diabetes: <7.0          Failed - B12 Level in normal range and within 720 days    No results found for: VITAMINB12       Passed - Cr in normal range and within 360 days    Creat  Date Value Ref Range Status  05/23/2017 0.77 0.70 - 1.25 mg/dL Final    Comment:    For patients >82 years of age, the reference limit for Creatinine is approximately 13% higher for people identified as African-American. .    Creatinine, Ser  Date Value Ref Range Status  07/15/2021 0.99 0.76 - 1.27 mg/dL Final         Passed - eGFR in normal range and within 360 days    GFR, Est African American  Date Value Ref Range Status  05/23/2017 114 > OR = 60 mL/min/1.11m Final   GFR calc Af Amer  Date Value Ref Range Status  12/18/2019 96 >59 mL/min/1.73 Final    Comment:    **[facility] currently reports eGFR in compliance with the current**   recommendations of the [employer] [facility] will   update reporting as new guidelines are published from the NKF-ASN   Task force.    GFR, Est Non African American  Date Value Ref Range Status  05/23/2017 99 > OR = 60 mL/min/1.763mFinal   GFR calc non Af Amer  Date Value Ref Range Status  12/18/2019 83 >59 mL/min/1.73 Final   eGFR  Date Value Ref Range Status  07/15/2021 85 >59  mL/min/1.73 Final         Passed - Valid encounter within last 6 months    Recent Outpatient Visits          3 weeks ago Type 2 diabetes mellitus with other specified complication, without long-term current use of insulin ([facility]  [facility] GipDO   6 months ago Type 2 diabetes mellitus with other specified complication, without long-term current use of insulin ([facility]  [facility]Jer[facility] Banana MD   9 months ago Annual physical exam   [facility]   1 year ago Poorly controlled type 2 diabetes mellitus ([facility]  [facility]   1 year ago Poorly controlled type 2 diabetes mellitus ([facility]  [facility] LPiJerrol Banana MD      Future Appointments            In 3  months Jerrol Banana., MD [facility], [facility]           Passed - CBC within normal limits and completed in the last 12 months    WBC  Date Value Ref Range Status  11/18/2020 7.0 3.4 - 10.8 x10E3/uL Final  11/28/2019 12.7 (H) 4.0 - 10.5 K/uL Final   RBC  Date Value Ref Range Status  11/18/2020 5.33 4.14 - 5.80 x10E6/uL Final  11/28/2019 3.96 (L) 4.22 - 5.81 MIL/uL Final   Hemoglobin  Date Value Ref Range Status  11/18/2020 15.4 13.0 - 17.7 g/dL Final   Hematocrit  Date Value Ref Range Status  11/18/2020 49.5 37.5 - 51.0 % Final   MCHC  Date Value Ref Range Status  11/18/2020 31.1 (L) 31.5 - 35.7 g/dL Final  11/28/2019 32.6 30.0 - 36.0 g/dL Final   [facility]  Date Value Ref Range Status  11/18/2020 28.9 26.6 - 33.0 pg Final  11/28/2019 30.8 26.0 - 34.0 pg Final   MCV  Date Value Ref Range Status  11/18/2020 93 79 - 97 fL Final   No results found for: PLTCOUNTKUC, LABPLAT, POCPLA RDW  Date Value Ref Range Status  11/18/2020 13.0 11.6 - 15.4 % Final          magnesium oxide (MAG-OX) 400 MG tablet [Pharmacy Med Name: MAGNESIUM  OXIDE 400 MG TABLET] 180 tablet 1    Sig: TAKE 1 TABLET BY MOUTH TWICE A DAY     Endocrinology:  Minerals - Magnesium Supplementation Failed - 08/08/2021 11:49 AM      Failed - Mg Level in normal range and within 360 days    Magnesium  Date Value Ref Range Status  11/27/2019 2.2 1.7 - 2.4 mg/dL Final    Comment:    Performed at [facility], [place] [address] [place], [place] [address]         Passed - Cr in normal range and within 360 days    Creat  Date Value Ref Range Status  05/23/2017 0.77 0.70 - 1.25 mg/dL Final    Comment:    For patients >65 years of age, the reference limit for Creatinine is approximately 13% higher for people identified as African-American. .    Creatinine, Ser  Date Value Ref Range Status  07/15/2021 0.99 0.76 - 1.27 mg/dL Final         Passed - Valid encounter within last 12 months    Recent Outpatient Visits          3 weeks ago Type 2 diabetes mellitus with other specified complication, without long-term current use of insulin [facility])   [facility] Rory Percy M, DO   6 months ago Type 2 diabetes mellitus with other specified complication, without long-term current use of insulin [facility])   [facility] Jerrol Banana., MD   9 months ago Annual physical exam   [facility] Jerrol Banana., MD   1 year ago Poorly controlled type 2 diabetes mellitus [facility])   [facility] Jerrol Banana., MD   1 year ago Poorly controlled type 2 diabetes mellitus [facility])   [facility] Jerrol Banana., MD      Future Appointments            In 3 months Jerrol Banana., MD [facility], [facility]

## 2021-08-10 NOTE — Telephone Encounter (Signed)
Requested medication (s) are due for refill today: yes  Requested medication (s) are on the active medication list: yes    Last refill: 02/09/21 180  1 refill  Future visit scheduled yes 11/25/21  Notes to clinic:Failed due to labs, please review.  Requested Prescriptions  Pending Prescriptions Disp Refills   magnesium oxide (MAG-OX) 400 MG tablet [Pharmacy Med Name: MAGNESIUM OXIDE 400 MG TABLET] 180 tablet 1    Sig: TAKE 1 TABLET BY MOUTH TWICE A DAY     Endocrinology:  Minerals - Magnesium Supplementation Failed - 08/08/2021 11:49 AM      Failed - Mg Level in normal range and within 360 days    Magnesium  Date Value Ref Range Status  11/27/2019 2.2 1.7 - 2.4 mg/dL Final    Comment:    Performed at [facility], [place] [address] [place], [place] [address]          Passed - Cr in normal range and within 360 days    Creat  Date Value Ref Range Status  05/23/2017 0.77 0.70 - 1.25 mg/dL Final    Comment:    For patients >65 years of age, the reference limit for Creatinine is approximately 13% higher for people identified as African-American. .    Creatinine, Ser  Date Value Ref Range Status  07/15/2021 0.99 0.76 - 1.27 mg/dL Final          Passed - Valid encounter within last 12 months    Recent Outpatient Visits           3 weeks ago Type 2 diabetes mellitus with other specified complication, without long-term current use of insulin [facility])   [place] Rory Percy M, DO   6 months ago Type 2 diabetes mellitus with other specified complication, without long-term current use of insulin [facility])   [facility] Jerrol Banana., MD   9 months ago Annual physical exam   [facility] Jerrol Banana., MD   1 year ago Poorly controlled type 2 diabetes mellitus [facility])   [facility] Jerrol Banana., MD   1 year ago Poorly controlled type 2 diabetes mellitus [facility])   [facility]  Jerrol Banana., MD       Future Appointments             In 3 months Jerrol Banana., MD [facility], [facility]            Signed Prescriptions Disp Refills   metFORMIN (GLUCOPHAGE) 1000 MG tablet 180 tablet 0    Sig: TAKE 1 TABLET (1,000 MG TOTAL) BY MOUTH 2 (TWO) TIMES DAILY WITH A MEAL.     Endocrinology:  Diabetes - Biguanides Failed - 08/08/2021 11:49 AM      Failed - HBA1C is between 0 and 7.9 and within 180 days    Hemoglobin A1C  Date Value Ref Range Status  07/15/2021 9.4 (A) 4.0 - 5.6 % Final   Hgb A1c MFr Bld  Date Value Ref Range Status  03/04/2021 9.3 (H) 4.8 - 5.6 % Final    Comment:             Prediabetes: 5.7 - 6.4          Diabetes: >6.4          Glycemic control for adults with diabetes: <7.0           Failed - B12 Level in  normal range and within 720 days    No results found for: VITAMINB12        Passed - Cr in normal range and within 360 days    Creat  Date Value Ref Range Status  05/23/2017 0.77 0.70 - 1.25 mg/dL Final    Comment:    For patients >28 years of age, the reference limit for Creatinine is approximately 13% higher for people identified as African-American. .    Creatinine, Ser  Date Value Ref Range Status  07/15/2021 0.99 0.76 - 1.27 mg/dL Final          Passed - eGFR in normal range and within 360 days    GFR, Est African American  Date Value Ref Range Status  05/23/2017 114 > OR = 60 mL/min/1.21m Final   GFR calc Af Amer  Date Value Ref Range Status  12/18/2019 96 >59 mL/min/1.73 Final    Comment:    **[facility] currently reports eGFR in compliance with the current**   recommendations of the [employer] [facility] will   update reporting as new guidelines are published from the NKF-ASN   Task force.    GFR, Est Non African American  Date Value Ref Range Status  05/23/2017 99 > OR = 60 mL/min/1.794mFinal   GFR calc non Af Amer  Date Value Ref Range Status  12/18/2019  83 >59 mL/min/1.73 Final   eGFR  Date Value Ref Range Status  07/15/2021 85 >59 mL/min/1.73 Final          Passed - Valid encounter within last 6 months    Recent Outpatient Visits           3 weeks ago Type 2 diabetes mellitus with other specified complication, without long-term current use of insulin ([facility]  Bu[facility] CtruMyles GipDO   6 months ago Type 2 diabetes mellitus with other specified complication, without long-term current use of insulin ([place]  [facility]iJerrol Banana MD   9 months ago Annual physical exam   Bu[facility]iJer[facility] Banana MD   1 year ago Poorly controlled type 2 diabetes mellitus ([facility]  [facility] AustiniJerrol Banana MD   1 year ago Poorly controlled type 2 diabetes mellitus ([facility]  BuO[facility] CenteriJerrol Banana MD       Future Appointments             In 3 months GiJerrol Banana MD [facility]            Passed - CBC within normal limits and completed in the last 12 months    WBC  Date Value Ref Range Status  11/18/2020 7.0 3.4 - 10.8 x10E3/uL Final  11/28/2019 12.7 (H) 4.0 - 10.5 K/uL Final   RBC  Date Value Ref Range Status  11/18/2020 5.33 4.14 - 5.80 x10E6/uL Final  11/28/2019 3.96 (L) 4.22 - 5.81 MIL/uL Final   Hemoglobin  Date Value Ref Range Status  11/18/2020 15.4 13.0 - 17.7 g/dL Final   Hematocrit  Date Value Ref Range Status  11/18/2020 49.5 37.5 - 51.0 % Final   MCHC  Date Value Ref Range Status  11/18/2020 31.1 (L) 31.5 - 35.7 g/dL Final  11/28/2019 32.6 30.0 - 36.0 g/dL Final   [facility] InstDate Value Ref Range Status  11/18/2020 28.9 26.6 - 33.0 pg Final  11/28/2019 30.8 26.0 - 34.0 pg  Final   MCV  Date Value Ref Range Status  11/18/2020 93 79 - 97 fL Final   No results found for: PLTCOUNTKUC, LABPLAT, POCPLA RDW  Date Value Ref Range Status  11/18/2020 13.0 11.6 - 15.4 %  Final

## 2021-09-13 ENCOUNTER — Other Ambulatory Visit: Payer: Self-pay | Admitting: Cardiovascular Disease

## 2021-09-13 DIAGNOSIS — Z952 Presence of prosthetic heart valve: Secondary | ICD-10-CM

## 2021-09-13 DIAGNOSIS — I25118 Atherosclerotic heart disease of native coronary artery with other forms of angina pectoris: Secondary | ICD-10-CM

## 2021-09-13 DIAGNOSIS — I350 Nonrheumatic aortic (valve) stenosis: Secondary | ICD-10-CM

## 2021-09-22 ENCOUNTER — Telehealth: Payer: Self-pay | Admitting: *Deleted

## 2021-09-23 NOTE — Telephone Encounter (Signed)
Error

## 2021-09-28 ENCOUNTER — Other Ambulatory Visit: Payer: Self-pay | Admitting: Family Medicine

## 2021-09-28 ENCOUNTER — Other Ambulatory Visit: Payer: Self-pay | Admitting: Cardiovascular Disease

## 2021-09-28 DIAGNOSIS — I10 Essential (primary) hypertension: Secondary | ICD-10-CM

## 2021-09-28 DIAGNOSIS — E785 Hyperlipidemia, unspecified: Secondary | ICD-10-CM

## 2021-09-29 NOTE — Telephone Encounter (Signed)
Please schedule 12 month F/U appointment for 90 day refills. Thank you! 

## 2021-09-29 NOTE — Telephone Encounter (Signed)
LVM to schedule f/u

## 2021-10-01 NOTE — Telephone Encounter (Signed)
Lmov  

## 2021-10-06 ENCOUNTER — Ambulatory Visit
Admission: RE | Admit: 2021-10-06 | Discharge: 2021-10-06 | Disposition: A | Discharge status: Home / Self Care | Payer: Managed Care, Other (non HMO) | Attending: Physician Assistant | Admitting: Physician Assistant

## 2021-10-06 ENCOUNTER — Ambulatory Visit
Admission: RE | Admit: 2021-10-06 | Discharge: 2021-10-06 | Disposition: A | Discharge status: Home / Self Care | Payer: Managed Care, Other (non HMO) | Source: Ambulatory Visit | Attending: Physician Assistant | Admitting: Physician Assistant

## 2021-10-06 ENCOUNTER — Telehealth: Payer: Self-pay

## 2021-10-06 ENCOUNTER — Encounter: Payer: Self-pay | Admitting: Physician Assistant

## 2021-10-06 ENCOUNTER — Ambulatory Visit (INDEPENDENT_AMBULATORY_CARE_PROVIDER_SITE_OTHER): Payer: Managed Care, Other (non HMO) | Admitting: Physician Assistant

## 2021-10-06 VITALS — BP 120/56 | HR 89 | Temp 99.2°F | Resp 16 | Wt 247.0 lb

## 2021-10-06 DIAGNOSIS — R051 Acute cough: Secondary | ICD-10-CM

## 2021-10-06 DIAGNOSIS — R0689 Other abnormalities of breathing: Secondary | ICD-10-CM

## 2021-10-06 DIAGNOSIS — J40 Bronchitis, not specified as acute or chronic: Secondary | ICD-10-CM

## 2021-10-06 MED ORDER — ALBUTEROL SULFATE HFA 108 (90 BASE) MCG/ACT IN AERS: 1.0000 | INHALATION_SPRAY | Freq: Four times a day (QID) | RESPIRATORY_TRACT | 1 refills | Status: DC | PRN

## 2021-10-06 NOTE — Patient Instructions (Addendum)
At this time I am concerned that you may have pneumonia so I am sending you for a chest xray ?Please have that completed across the street today ?I would like you to do the following to assist with your breathing and coughing: ?Use your albuterol inhaler when you feel like your coughing is more severe up to every 6 hours  ?If you are using it consistently more than twice per day please make an apt to follow up as a daily inhaler may be needed  ?I would like you to add an oral second generation antihistamine such as Allegra, Claritin or Zyrtec (generics of these work fine) ?I will keep you updated on the status of the chest xray and if an antibiotic is needed once the results are back ? ? ?

## 2021-10-06 NOTE — Telephone Encounter (Signed)
Copied from [place] [id number]. Topic: General - Other ?>> Oct 06, 2021 12:16 PM Oneta Rack wrote: ?Patient would like to know if the office will provide him a mask at today appointment. ?

## 2021-10-06 NOTE — Progress Notes (Signed)
?  ? ?I,[name],acting as a scribe for [employer], PA-C.,have documented all relevant documentation on the behalf of [name], PA-C,as directed by  [employer], PA-C while in the presence of Apryll Hinkle E Ricard Faulkner, PA-C.  ? ?Established patient visit ? ? ?Patient: Jeffery West   DOB: 07-11-1955   66 y.o. Male  MRN: [id number] ?Visit Date: 10/06/2021 ? ?Today's healthcare provider: Dani Gobble Omie Ferger, PA-C  ?Introduced myself to the patient as a Journalist, newspaper and provided education on APPs in clinical practice.  ? ?CC: Cough, congestion  ? ? ?Subjective  ?  ? ? ?States symptoms began last week  ?Reports he has had a productive cough, congestion, rhinorrhea ?Reports history of allergies but thinks this is different ?States he has been taking OTC Mucinex, vicks saline nasal spray, cough syrup, throat lozenges - reports limited relief ? ?States symptoms seem to be slightly improving but is tired of dealing with it ?Has not been using his albuterol inhaler to assist with this ?Reports his wife has had similar symptoms and his sister in law  ? ? ?Medications: ?Outpatient Medications Prior to Visit  ?Medication Sig  ? acetaminophen (TYLENOL) 500 MG tablet Take 1,000 mg by mouth every 6 (six) hours as needed for moderate pain or headache.  ? albuterol (PROAIR HFA) 108 (90 Base) MCG/ACT inhaler Inhale 1 puff into the lungs every 6 (six) hours as needed for wheezing or shortness of breath.  ? allopurinol (ZYLOPRIM) 300 MG tablet TAKE 1 TABLET BY MOUTH EVERY DAY  ? aspirin EC 81 MG tablet Take 81 mg by mouth daily. Swallow whole.  ? BD PEN NEEDLE NANO 2ND GEN 32G X 4 MM MISC USE AS DIRECTED  ? Cholecalciferol (VITAMIN D) 2000 units CAPS Take 2,000 Units by mouth every evening.   ? empagliflozin (JARDIANCE) 10 MG TABS tablet Take 1 tablet (10 mg total) by mouth daily before breakfast.  ? ezetimibe (ZETIA) 10 MG tablet TAKE 1 TABLET BY MOUTH EVERY DAY  ? glucose blood (ONETOUCH VERIO) test strip Use as instructed; BID  ? icosapent Ethyl  (VASCEPA) 1 g capsule Take 2 capsules (2 g total) by mouth 2 (two) times daily.  ? insulin degludec (TRESIBA FLEXTOUCH) 100 UNIT/ML FlexTouch Pen INJECT 25 UNITS INTO THE SKIN DAILY AT 10 PM.  ? losartan (COZAAR) 50 MG tablet TAKE 1 TABLET BY MOUTH EVERY DAY  ? magnesium oxide (MAG-OX) 400 MG tablet Take 400 mg by mouth 2 (two) times daily.   ? magnesium oxide (MAG-OX) 400 MG tablet TAKE 1 TABLET BY MOUTH TWICE A DAY  ? metFORMIN (GLUCOPHAGE) 1000 MG tablet TAKE 1 TABLET (1,000 MG TOTAL) BY MOUTH 2 (TWO) TIMES DAILY WITH A MEAL.  ? metoprolol tartrate (LOPRESSOR) 25 MG tablet TAKE 1 TABLET BY MOUTH TWICE A DAY  ? nystatin cream (MYCOSTATIN) APPLY TO AFFECTED AREA TWICE A DAY  ? OneTouch Delica Lancets 27P MISC Use 3 times a day  ? rosuvastatin (CRESTOR) 5 MG tablet TAKE 1 TABLET BY MOUTH EVERY DAY  ? Semaglutide,0.25 or 0.'5MG'$ /DOS, (OZEMPIC, 0.25 OR 0.5 MG/DOSE,) 2 MG/1.5ML SOPN INJECT 0.5 MG INTO THE SKIN ONCE A WEEK.  ? sildenafil (REVATIO) 20 MG tablet Take 1 tablet (20 mg total) by mouth 3 (three) times daily as needed.  ? tamsulosin (FLOMAX) 0.4 MG CAPS capsule Take 1 capsule (0.4 mg total) by mouth daily.  ? traMADol (ULTRAM) 50 MG tablet Take 50 mg by mouth every 6 (six) hours as needed.  ? ?  No facility-administered medications prior to visit.  ? ? ?Review of Systems  ?Constitutional:  Negative for chills, diaphoresis, fatigue and fever.  ?HENT:  Positive for congestion, postnasal drip, rhinorrhea, sinus pressure, sinus pain and sore throat ("More irritation"). Negative for ear pain and sneezing.   ?Respiratory:  Positive for cough. Negative for chest tightness, shortness of breath and wheezing.   ?Cardiovascular:  Negative for chest pain and palpitations.  ?Gastrointestinal:  Negative for diarrhea, nausea and vomiting.  ?Musculoskeletal:  Negative for arthralgias and myalgias.  ?Neurological:  Positive for headaches. Negative for dizziness and light-headedness.  ? ? ?  Objective  ?  ?BP (!) 120/56 (BP  Location: Left Arm, Patient Position: Sitting, Cuff Size: Large)   Pulse 89   Temp 99.2 ?F (37.3 ?C) (Oral)   Resp 16   Wt 247 lb (112 kg)   SpO2 95%   BMI 34.45 kg/m?  ? ? ?Physical Exam ?Vitals reviewed.  ?Constitutional:   ?   General: He is awake.  ?   Appearance: Normal appearance. He is well-developed and well-groomed. He is obese.  ?HENT:  ?   Head: Normocephalic and atraumatic.  ?   Right Ear: Hearing, tympanic membrane, ear canal and external ear normal.  ?   Left Ear: Hearing, ear canal and external ear normal.  No middle ear effusion. Tympanic membrane is erythematous and retracted. Tympanic membrane is not perforated or bulging.  ?   Mouth/Throat:  ?   Pharynx: Oropharynx is clear. Uvula midline. No pharyngeal swelling, oropharyngeal exudate, posterior oropharyngeal erythema or uvula swelling.  ?   Tonsils: No tonsillar exudate or tonsillar abscesses.  ?Eyes:  ?   General: Allergic shiner present.  ?   Conjunctiva/sclera: Conjunctivae normal.  ?Cardiovascular:  ?   Rate and Rhythm: Normal rate and regular rhythm.  ?   Pulses: Normal pulses.     ?     Radial pulses are 2+ on the right side and 2+ on the left side.  ?   Heart sounds: Heart sounds are distant. Murmur heard.  ?Diastolic murmur is present with a grade of 1/4.  ?Pulmonary:  ?   Effort: Pulmonary effort is normal.  ?   Breath sounds: Examination of the right-middle field reveals decreased breath sounds. Examination of the left-middle field reveals decreased breath sounds. Examination of the right-lower field reveals decreased breath sounds. Examination of the left-lower field reveals decreased breath sounds. Decreased breath sounds present. No wheezing, rhonchi or rales.  ?Lymphadenopathy:  ?   Head:  ?   Right side of head: No submental or submandibular adenopathy.  ?   Left side of head: No submental or submandibular adenopathy.  ?   Upper Body:  ?   Right upper body: No supraclavicular adenopathy.  ?   Left upper body: No  supraclavicular adenopathy.  ?Neurological:  ?   Mental Status: He is alert.  ?Psychiatric:     ?   Behavior: Behavior is cooperative.  ?  ? ? ?No results found for any visits on 10/06/21. ? Assessment & Plan  ?  ? ?Problem List Items Addressed This Visit   ?None ?Visit Diagnoses   ? ? Acute cough    -  Primary ?Acute, new problem  ?Potentially allergy mediated but cannot rule out acute infectious URI at this time  ?Recommend adding second gen antihistamine  ?Will refill albuterol inhaler to assist with coughing and intermittent SOB  ?Recommend continuing with symptomatic treatment  ?Follow up as needed  for progressing or persistent symptoms ?  ? Relevant Medications  ? albuterol (PROAIR HFA) 108 (90 Base) MCG/ACT inhaler  ? Other Relevant Orders  ? DG Chest 2 View (Completed)  ? Decreased lung sounds     ?Acute, new problem ?Will assess for pulmonary issues with CXR  ?Recommend using albuterol inhaler to assist with breathing and cough as needed  ?Results to dictate further management  ?Follow up as needed ?  ? Relevant Orders  ? DG Chest 2 View (Completed)  ? Bronchitis     ?Acute, new problem ?Suspect this is secondary to upper respiratory symptoms and mild underlying reactive airway ?Will refill albuterol inhaler to assist with cough ?Follow up as needed for worsening or persistent symptoms   ? Relevant Medications  ? albuterol (PROAIR HFA) 108 (90 Base) MCG/ACT inhaler  ? ?  ? ? ? ?No follow-ups on file. ? ? ?I, Jordanne Elsbury E Nakyah Erdmann, PA-C, have reviewed all documentation for this visit. The documentation on 10/07/21 for the exam, diagnosis, procedures, and orders are all accurate and complete. ? ? ?Rashena Dowling, Glennie Isle MPH ?[facility] ? ? ? ?

## 2021-10-06 NOTE — Telephone Encounter (Signed)
Patient seen today and masked provided ?

## 2021-10-07 ENCOUNTER — Other Ambulatory Visit: Payer: Self-pay | Admitting: Family Medicine

## 2021-10-07 DIAGNOSIS — E1169 Type 2 diabetes mellitus with other specified complication: Secondary | ICD-10-CM

## 2021-10-07 MED ORDER — EMPAGLIFLOZIN 10 MG PO TABS: 10.0000 mg | ORAL_TABLET | Freq: Every day | ORAL | 0 refills | Status: DC

## 2021-10-07 NOTE — Telephone Encounter (Signed)
Requested Prescriptions  ?Pending Prescriptions Disp Refills  ?? empagliflozin (JARDIANCE) 10 MG TABS tablet 90 tablet 0  ?  Sig: Take 1 tablet (10 mg total) by mouth daily before breakfast.  ?  ? Endocrinology:  Diabetes - SGLT2 Inhibitors Failed - 10/07/2021  5:10 PM  ?  ?  Failed - HBA1C is between 0 and 7.9 and within 180 days  ?  Hemoglobin A1C  ?Date Value Ref Range Status  ?07/15/2021 9.4 (A) 4.0 - 5.6 % Final  ? ?Hgb A1c MFr Bld  ?Date Value Ref Range Status  ?03/04/2021 9.3 (H) 4.8 - 5.6 % Final  ?  Comment:  ?           Prediabetes: 5.7 - 6.4 ?         Diabetes: >6.4 ?         Glycemic control for adults with diabetes: <7.0 ?  ?   ?  ?  Passed - Cr in normal range and within 360 days  ?  Creat  ?Date Value Ref Range Status  ?05/23/2017 0.77 0.70 - 1.25 mg/dL Final  ?  Comment:  ?  For patients >54 years of age, the reference limit ?for Creatinine is approximately 13% higher for people ?identified as African-American. ?. ?  ? ?Creatinine, Ser  ?Date Value Ref Range Status  ?07/15/2021 0.99 0.76 - 1.27 mg/dL Final  ?   ?  ?  Passed - eGFR in normal range and within 360 days  ?  GFR, Est African American  ?Date Value Ref Range Status  ?05/23/2017 114 > OR = 60 mL/min/1.59m Final  ? ?GFR calc Af Amer  ?Date Value Ref Range Status  ?12/18/2019 96 >59 mL/min/1.73 Final  ?  Comment:  ?  **[facility] currently reports eGFR in compliance with the current** ?  recommendations of the [employer] [facility] will ?  update reporting as new guidelines are published from the NKF-ASN ?  Task force. ?  ? ?GFR, Est Non African American  ?Date Value Ref Range Status  ?05/23/2017 99 > OR = 60 mL/min/1.75mFinal  ? ?GFR calc non Af Amer  ?Date Value Ref Range Status  ?12/18/2019 83 >59 mL/min/1.73 Final  ? ?eGFR  ?Date Value Ref Range Status  ?07/15/2021 85 >59 mL/min/1.73 Final  ?   ?  ?  Passed - Valid encounter within last 6 months  ?  Recent Outpatient Visits   ?      ? Yesterday Acute cough  ? BuAndrewsPA-C  ? 2 months ago Type 2 diabetes mellitus with other specified complication, without long-term current use of insulin (HCR[place] ? BuDoradoDO  ? 7 months ago Type 2 diabetes mellitus with other specified complication, without long-term current use of insulin (HCCrete ? Bu[facility]iJerrol Banana MD  ? 10 months ago Annual physical exam  ? [facility]iJerrol Banana MD  ? 1 year ago Poorly controlled type 2 diabetes mellitus ([facility] ? BuAssenc[facility]iJerrol Banana MD  ?  ?  ?Future Appointments   ?        ? In 1 month Gollan, TiKathlene NovemberMD CHSan [facility] IncLBCDBurlingt  ? In 1 month GiJerrol Banana MD BuAmbulatory Care CenterPEC  ?  ? ?  ?  ?  ? ?

## 2021-10-07 NOTE — Telephone Encounter (Signed)
Medication Refill - Medication: empagliflozin (JARDIANCE) 10 MG TABS tablet ? ?Has the patient contacted their pharmacy? Yes.   Pt told to contact provider ? ?Preferred Pharmacy (with phone number or street name):  ?[id number] [place] - [address] Phone:  [phone] ?Fax:  [phone] ?  ? ?Has the patient been seen for an appointment in the last year OR does the patient have an upcoming appointment? Yes.   ? ?Agent: Please be advised that RX refills may take up to 3 business days. We ask that you follow-up with your pharmacy. ? ?

## 2021-10-13 NOTE — Telephone Encounter (Signed)
Scheduled

## 2021-10-24 ENCOUNTER — Other Ambulatory Visit: Payer: Self-pay | Admitting: Family Medicine

## 2021-10-24 DIAGNOSIS — R252 Cramp and spasm: Secondary | ICD-10-CM

## 2021-10-26 NOTE — Telephone Encounter (Signed)
Requested medication (s) are due for refill today: yes ? ?Requested medication (s) are on the active medication list: yes ? ?Last refill:  08/10/21 #180/0 ? ?Future visit scheduled: yes 11/25/21 ? ?Notes to clinic:  Unable to refill per protocol due to failed labs, no updated results. ? ? ? ?  ?Requested Prescriptions  ?Pending Prescriptions Disp Refills  ? magnesium oxide (MAG-OX) 400 (240 Mg) MG tablet [Pharmacy Med Name: MAGNESIUM OXIDE 400 MG TABLET] 180 tablet   ?  Sig: TAKE 1 TABLET BY MOUTH TWICE A DAY  ?  ? Endocrinology:  Minerals - Magnesium Supplementation Failed - 10/24/2021  8:05 AM  ?  ?  Failed - Mg Level in normal range and within 360 days  ?  Magnesium  ?Date Value Ref Range Status  ?11/27/2019 2.2 1.7 - 2.4 mg/dL Final  ?  Comment:  ?  Performed at [facility] [address] [place], [place] [address]  ?  ?  ?  ?  Passed - Cr in normal range and within 360 days  ?  Creat  ?Date Value Ref Range Status  ?05/23/2017 0.77 0.70 - 1.25 mg/dL Final  ?  Comment:  ?  For patients >73 years of age, the reference limit ?for Creatinine is approximately 13% higher for people ?identified as African-American. ?. ?  ? ?Creatinine, Ser  ?Date Value Ref Range Status  ?07/15/2021 0.99 0.76 - 1.27 mg/dL Final  ?  ?  ?  ?  Passed - Valid encounter within last 12 months  ?  Recent Outpatient Visits   ? ?      ? 2 weeks ago Acute cough  ? Celina, PA-C  ? 3 months ago Type 2 diabetes mellitus with other specified complication, without long-term current use of insulin ([place])  ? Rowan, DO  ? 8 months ago Type 2 diabetes mellitus with other specified complication, without long-term current use of insulin ([place])  ? [facility] Jerrol Banana., MD  ? 11 months ago Annual physical exam  ? [facility] Jerrol Banana., MD  ? 1 year ago Poorly controlled type 2 diabetes mellitus [facility])  ? [facility]  Jerrol Banana., MD  ? ?  ?  ?Future Appointments   ? ?        ? In 4 weeks Gollan, [name], MD [facility]  ? In 1 month Jerrol Banana., MD [facility]  ? ?  ? ? ?  ?  ?  ? ?

## 2021-11-04 ENCOUNTER — Telehealth: Payer: Self-pay | Admitting: Family Medicine

## 2021-11-04 DIAGNOSIS — E1169 Type 2 diabetes mellitus with other specified complication: Secondary | ICD-10-CM

## 2021-11-04 NOTE — Telephone Encounter (Signed)
Pt states the pharmacy told him the manufacturing co for  ?Semaglutide,0.25 or 0.'5MG'$ /DOS, (OZEMPIC, 0.25 OR 0.5 MG/DOSE,) 2 MG/1.5ML SOPN ? ?Has changed its packaging.  They will not refill until the dr changes the Rx. ?Pt states he is not completely sure how it needs to be, but does not think they make 1.5ML anymore. ?But he takes his dose on Friday, so this needs to be resolved by then please. ?

## 2021-11-04 NOTE — Telephone Encounter (Signed)
Please advise? Okay to change? ?

## 2021-11-04 NOTE — Telephone Encounter (Signed)
[facility] and spoke to [place] about the refill(s) Ozempic requested. Advised what the patient said below. He says the 1.5 ml pen has been discontinued and they have the 3 ml pen available. He says Dr. Rosanna Randy will need to send a new Rx for Ozempic the 59m pen dose.  ?

## 2021-11-06 ENCOUNTER — Other Ambulatory Visit: Payer: Self-pay | Admitting: Family Medicine

## 2021-11-06 DIAGNOSIS — E1169 Type 2 diabetes mellitus with other specified complication: Secondary | ICD-10-CM

## 2021-11-06 DIAGNOSIS — E119 Type 2 diabetes mellitus without complications: Secondary | ICD-10-CM

## 2021-11-06 MED ORDER — OZEMPIC (0.25 OR 0.5 MG/DOSE) 2 MG/1.5ML ~~LOC~~ SOPN: 0.5000 mg | PEN_INJECTOR | SUBCUTANEOUS | 3 refills | Status: DC

## 2021-11-06 NOTE — Telephone Encounter (Signed)
Pt called in stating he is needing to take his medication today, and is hoping it can go ahead and be sent over, pt states he takes it at the same time every week, and if not pt stated if he could possible get a sample if it cant be sent today. Please advise.

## 2021-11-09 NOTE — Telephone Encounter (Signed)
Done

## 2021-11-14 ENCOUNTER — Other Ambulatory Visit: Payer: Self-pay | Admitting: Cardiovascular Disease

## 2021-11-17 ENCOUNTER — Other Ambulatory Visit: Payer: Self-pay | Admitting: Cardiovascular Disease

## 2021-11-22 NOTE — Progress Notes (Unsigned)
Cardiology Office Note  Date:  11/23/2021   ID:  [name], DOB [birth year], MRN [id number]  PCP:  Jerrol Banana., MD   Chief Complaint  Patient presents with   12 month follow up     "Doing well." Medications reviewed by the patient verbally.     HPI:  Mr Shakespeare is a 66 year old gentleman with past medical history of Diabetes   elevated hemoglobin A1c 8-10 No significant smoking hx HTN Hyperlipidemia Morbid obesity  aortic valve stenosis  on echocardiogram June 2017 Who presents for follow-up of his coronary disease, aortic valve replacement with CABG x2 in 11/26/2019.  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    Cardiac catheterization 10/19/2019 showed 80% stenosis to mid LAD, 70% first diagonal stenosis.   Underwent coronary bypass graft surgery and bioprosthetic aortic valve replacement AVR (58m Edwards Inspiris Resilia pericardial valve) and CABGx2 (LIMA-LAD, SVG-first diagonal) 11/26/2019.   PMH:   has a past medical history of Coronary artery disease, Diabetes mellitus without complication ([place], Erectile dysfunction, Gout, Heart murmur, Hyperlipidemia, Hypertension, Obesity, Osteoarthritis, and Vitamin D deficiency.  PSH:    Past Surgical History:  Procedure Laterality Date   AORTIC VALVE REPLACEMENT N/A 11/26/2019   Procedure: AORTIC VALVE REPLACEMENT (AVR) USING INSPIRIS AORTIC VALVE SIZE 23MM;  Surgeon: BGaye Pollack MD;  [facility]  Service: Open Heart Surgery;  Laterality: N/A;   CARDIAC CATHETERIZATION     COLONOSCOPY WITH PROPOFOL N/A 08/24/2019   Procedure: COLONOSCOPY WITH PROPOFOL;  Surgeon: WLucilla Lame MD;  [facility];  Service: Endoscopy;  Laterality: N/A;   CORONARY ARTERY BYPASS GRAFT N/A 11/26/2019   Procedure: CORONARY ARTERY BYPASS GRAFTING (CABG) X Two , using left internal mammary artery and right leg greater saphenous vein harvested endoscopically.;  Surgeon: BGaye Pollack MD;  [facility];  Service: Open Heart Surgery;  Laterality: N/A;   LEFT HEART CATH AND CORONARY ANGIOGRAPHY Left 10/19/2019   Procedure: LEFT HEART CATH AND CORONARY ANGIOGRAPHY;  Surgeon: GMinna Merritts MD;  [facility];  Service: Cardiovascular;  Laterality: Left;   NO PAST SURGERIES     RIGHT HEART CATH N/A 10/19/2019   Procedure: RIGHT HEART CATH;  Surgeon: GMinna Merritts MD;  [facility];  Service: Cardiovascular;  Laterality: N/A;   SHOULDER ARTHROSCOPY WITH OPEN ROTATOR CUFF REPAIR Right 07/26/2017   Procedure: SHOULDER ARTHROSCOPY WITH OPEN ROTATOR CUFF REPAIR,SUBACROMINAL DECOMPRESSION,DISTAL CLAVICLE EXCISION;  Surgeon: KThornton Park MD;  Location: [facility];  Service: Orthopedics;  Laterality: Right;   TEE WITHOUT CARDIOVERSION N/A 11/26/2019   Procedure: TRANSESOPHAGEAL ECHOCARDIOGRAM (TEE);  Surgeon: BGaye Pollack MD;  [facility]  Service: Open Heart Surgery;  Laterality: N/A;    Current Outpatient Medications  Medication Sig Dispense Refill   acetaminophen (TYLENOL) 500 MG tablet Take 1,000 mg by mouth every 6 (six) hours as needed for moderate  pain or headache.     albuterol (PROAIR HFA) 108 (90 Base) MCG/ACT inhaler Inhale 1 puff into the lungs every 6 (six) hours as needed for wheezing or shortness of breath. 18 g 1   allopurinol (ZYLOPRIM) 300 MG tablet TAKE 1 TABLET BY MOUTH EVERY DAY 90 tablet 3   aspirin EC 81 MG  tablet Take 81 mg by mouth daily. Swallow whole.     BD PEN NEEDLE NANO 2ND GEN 32G X 4 MM MISC USE AS DIRECTED 100 each 8   Cholecalciferol (VITAMIN D) 2000 units CAPS Take 2,000 Units by mouth every evening.      empagliflozin (JARDIANCE) 10 MG TABS tablet Take 1 tablet (10 mg total) by mouth daily before breakfast. 90 tablet 0   ezetimibe (ZETIA) 10 MG tablet TAKE 1 TABLET BY MOUTH EVERY DAY 90 tablet 2   glucose blood (ONETOUCH VERIO) test strip Use as instructed; BID 100 each 12   icosapent Ethyl (VASCEPA) 1 g capsule Take 2 capsules (2 g total) by mouth 2 (two) times daily. 360 capsule 3   insulin degludec (TRESIBA FLEXTOUCH) 100 UNIT/ML FlexTouch Pen INJECT 25 UNITS INTO THE SKIN DAILY AT 10 PM. 3 mL 12   losartan (COZAAR) 50 MG tablet TAKE 1 TABLET BY MOUTH EVERY DAY 90 tablet 0   magnesium oxide (MAG-OX) 400 (240 Mg) MG tablet TAKE 1 TABLET BY MOUTH TWICE A DAY 180 tablet 3   magnesium oxide (MAG-OX) 400 MG tablet Take 400 mg by mouth 2 (two) times daily.      metFORMIN (GLUCOPHAGE) 1000 MG tablet TAKE 1 TABLET (1,000 MG TOTAL) BY MOUTH TWICE A DAY WITH FOOD 180 tablet 0   metoprolol tartrate (LOPRESSOR) 25 MG tablet TAKE 1 TABLET BY MOUTH TWICE A DAY 180 tablet 0   nystatin cream (MYCOSTATIN) APPLY TO AFFECTED AREA TWICE A DAY 30 g 6   OneTouch Delica Lancets 53I MISC Use 3 times a day 100 each 12   rosuvastatin (CRESTOR) 5 MG tablet TAKE 1 TABLET BY MOUTH EVERY DAY 30 tablet 0   Semaglutide,0.25 or 0.'5MG'$ /DOS, (OZEMPIC, 0.25 OR 0.5 MG/DOSE,) 2 MG/1.5ML SOPN Inject 0.5 mg into the skin once a week. 1.5 mL 3   sildenafil (REVATIO) 20 MG tablet Take 1 tablet (20 mg total) by mouth 3 (three) times daily as needed. 90 tablet 3   tamsulosin (FLOMAX) 0.4 MG CAPS capsule Take 1 capsule (0.4 mg total) by mouth daily. 90 capsule 3   traMADol (ULTRAM) 50 MG tablet Take 50 mg by mouth every 6 (six) hours as needed.     No current facility-administered medications for this visit.    Allergies:    Penicillins   Social History:  The patient  reports that he has never smoked. He has never used smokeless tobacco. He reports current alcohol use. He reports that he does not use drugs.   Family History:   family history includes Asthma in his mother; Cancer in his father; Hypertension in his brother.    Review of Systems: Review of Systems  Constitutional: Negative.   HENT: Negative.    Respiratory: Negative.    Cardiovascular: Negative.   Gastrointestinal: Negative.   Musculoskeletal: Negative.   Neurological: Negative.   Psychiatric/Behavioral: Negative.    All other systems reviewed and are negative.  PHYSICAL EXAM: VS:  BP 118/60 (BP Location: Left Arm, Patient Position: Sitting, Cuff Size: Normal)   Pulse 79   Ht '5\' 10"'$  (1.778 m)   Wt 246 lb  4 oz (111.7 kg)   SpO2 97%   BMI 35.33 kg/m  , BMI Body mass index is 35.33 kg/m. Constitutional:  oriented to person, place, and time. No distress.  HENT:  Head: Grossly normal Eyes:  no discharge. No scleral icterus.  Neck: No JVD, no carotid bruits  Cardiovascular: Regular rate and rhythm, no murmurs appreciated Pulmonary/Chest: Clear to auscultation bilaterally, no wheezes or rails Abdominal: Soft.  no distension.  no tenderness.  Musculoskeletal: Normal range of motion Neurological:  normal muscle tone. Coordination normal. No atrophy Skin: Skin warm and dry Psychiatric: normal affect, pleasant  Recent Labs: 07/15/2021: BUN 17; Creatinine, Ser 0.99; Potassium 5.3; Sodium 135    Lipid Panel Lab Results  Component Value Date   CHOL 134 07/15/2021   HDL 33 (L) 07/15/2021   LDLCALC 60 07/15/2021   TRIG 254 (H) 07/15/2021      Wt Readings from Last 3 Encounters:  11/23/21 246 lb 4 oz (111.7 kg)  10/06/21 247 lb (112 kg)  07/15/21 256 lb (116.1 kg)     ASSESSMENT AND PLAN:  Problem List Items Addressed This Visit       Cardiology Problems   HTN (hypertension)   Aortic valve stenosis   Relevant Orders   EKG  12-Lead     Other   Poorly controlled type 2 diabetes mellitus ([place])   Relevant Orders   EKG 12-Lead   Morbid obesity ([place])   S/P AVR   Other Visit Diagnoses     Coronary artery disease of native artery of native heart with stable angina pectoris ([place])    -  Primary   Relevant Orders   EKG 12-Lead   Essential hypertension       Hyperlipidemia LDL goal <70          CAD with chronic stable angina CABG x2 2021, Recommended he aim for hemoglobin A1c in the 6 range Weight is trending down Currently with no symptoms of angina. No further workup at this time. Continue current medication regimen.  Diabetes type 2 with complications Weight trending downward 10 pounds in 5 months 15 pounds in 1 year We have encouraged continued exercise, careful diet management in an effort to lose weight.  Aortic valve stenosis, status post AVR  bioprosthetic valve placement 2021 echocardiogram completed July 2022, stable valve No change in clinical status  Hyperlipidemia Cholesterol is at goal on the current lipid regimen. No changes to the medications were made.   Total encounter time more than 30 minutes  Greater than 50% was spent in counseling and coordination of care with the patient    Signed, Esmond Plants, M.D., Ph.D. [place], [place]

## 2021-11-23 ENCOUNTER — Ambulatory Visit (INDEPENDENT_AMBULATORY_CARE_PROVIDER_SITE_OTHER): Payer: Managed Care, Other (non HMO) | Admitting: Cardiovascular Disease

## 2021-11-23 ENCOUNTER — Encounter: Payer: Self-pay | Admitting: Cardiovascular Disease

## 2021-11-23 VITALS — BP 118/60 | HR 79 | Ht 70.0 in | Wt 246.2 lb

## 2021-11-23 DIAGNOSIS — Z952 Presence of prosthetic heart valve: Secondary | ICD-10-CM | POA: Diagnosis not present

## 2021-11-23 DIAGNOSIS — I10 Essential (primary) hypertension: Secondary | ICD-10-CM

## 2021-11-23 DIAGNOSIS — I25118 Atherosclerotic heart disease of native coronary artery with other forms of angina pectoris: Secondary | ICD-10-CM | POA: Diagnosis not present

## 2021-11-23 DIAGNOSIS — E1165 Type 2 diabetes mellitus with hyperglycemia: Secondary | ICD-10-CM

## 2021-11-23 DIAGNOSIS — I350 Nonrheumatic aortic (valve) stenosis: Secondary | ICD-10-CM | POA: Diagnosis not present

## 2021-11-23 DIAGNOSIS — E785 Hyperlipidemia, unspecified: Secondary | ICD-10-CM

## 2021-11-23 MED ORDER — ROSUVASTATIN CALCIUM 5 MG PO TABS: 5.0000 mg | ORAL_TABLET | Freq: Every day | ORAL | 3 refills | Status: DC

## 2021-11-23 MED ORDER — METOPROLOL TARTRATE 25 MG PO TABS: 25.0000 mg | ORAL_TABLET | Freq: Two times a day (BID) | ORAL | 3 refills | Status: DC

## 2021-11-23 MED ORDER — SILDENAFIL CITRATE 20 MG PO TABS: 20.0000 mg | ORAL_TABLET | Freq: Three times a day (TID) | ORAL | 3 refills | Status: DC | PRN

## 2021-11-23 NOTE — Patient Instructions (Signed)
Medication Instructions:  No changes  If you need a refill on your cardiac medications before your next appointment, please call your pharmacy.   Lab work: No new labs needed  Testing/Procedures: No new testing needed  Follow-Up: At [facility], you and your health needs are our priority.  As part of our continuing mission to provide you with exceptional heart care, we have created designated Provider Care Teams.  These Care Teams include your primary Cardiologist (physician) and Advanced Practice Providers (APPs -  Physician Assistants and Nurse Practitioners) who all work together to provide you with the care you need, when you need it.  You will need a follow up appointment in 12 months  Providers on your designated Care Team:   Christopher Berge, NP Ryan Dunn, PA-C Cadence Furth, PA-C  COVID-19 Vaccine Information can be found at: [email or web address] For questions related to vaccine distribution or appointments, please email [email or web address] or call [phone].   

## 2021-11-25 ENCOUNTER — Ambulatory Visit (INDEPENDENT_AMBULATORY_CARE_PROVIDER_SITE_OTHER): Payer: Managed Care, Other (non HMO) | Admitting: Family Medicine

## 2021-11-25 VITALS — BP 115/70 | HR 73 | Temp 98.1°F | Wt 244.0 lb

## 2021-11-25 DIAGNOSIS — Z1389 Encounter for screening for other disorder: Secondary | ICD-10-CM | POA: Diagnosis not present

## 2021-11-25 DIAGNOSIS — Z0000 Encounter for general adult medical examination without abnormal findings: Secondary | ICD-10-CM | POA: Diagnosis not present

## 2021-11-25 DIAGNOSIS — E1169 Type 2 diabetes mellitus with other specified complication: Secondary | ICD-10-CM

## 2021-11-25 DIAGNOSIS — Z952 Presence of prosthetic heart valve: Secondary | ICD-10-CM

## 2021-11-25 DIAGNOSIS — I10 Essential (primary) hypertension: Secondary | ICD-10-CM

## 2021-11-25 DIAGNOSIS — E1165 Type 2 diabetes mellitus with hyperglycemia: Secondary | ICD-10-CM

## 2021-11-25 DIAGNOSIS — Z125 Encounter for screening for malignant neoplasm of prostate: Secondary | ICD-10-CM | POA: Diagnosis not present

## 2021-11-25 DIAGNOSIS — E782 Mixed hyperlipidemia: Secondary | ICD-10-CM

## 2021-11-25 DIAGNOSIS — I2510 Atherosclerotic heart disease of native coronary artery without angina pectoris: Secondary | ICD-10-CM

## 2021-11-25 LAB — POCT URINALYSIS DIPSTICK
Bilirubin, UA: NEGATIVE
Blood, UA: NEGATIVE
Glucose, UA: POSITIVE — AB
Ketones, UA: NEGATIVE
Leukocytes, UA: NEGATIVE
Nitrite, UA: NEGATIVE
Protein, UA: NEGATIVE
Spec Grav, UA: 1.02 (ref 1.010–1.025)
Urobilinogen, UA: 0.2 E.U./dL
pH, UA: 6 (ref 5.0–8.0)

## 2021-11-25 NOTE — Progress Notes (Signed)
Complete physical exam   Patient: Jeffery West   DOB: 1956-01-04   66 y.o. Male  MRN: [id number] Visit Date: 11/25/2021  Today's healthcare provider: [name], MD   No chief complaint on file.  Subjective    Jeffery West is a 66 y.o. male who presents today for a complete physical exam.  He has been working on diet and exercise.  Overall he feels well and better than he has in some time. He snores and his wife states he has apneic spells. HPI    Past Medical History:  Diagnosis Date   Coronary artery disease    Diabetes mellitus without complication ([place])    Erectile dysfunction    Gout    Heart murmur    Hyperlipidemia    Hypertension    Obesity    Osteoarthritis    Vitamin D deficiency    Past Surgical History:  Procedure Laterality Date   AORTIC VALVE REPLACEMENT N/A 11/26/2019   Procedure: AORTIC VALVE REPLACEMENT (AVR) USING INSPIRIS AORTIC VALVE SIZE 23MM;  Surgeon: Gaye Pollack, MD;  Location: [place];  Service: Open Heart Surgery;  Laterality: N/A;   CARDIAC CATHETERIZATION     COLONOSCOPY WITH PROPOFOL N/A 08/24/2019   Procedure: COLONOSCOPY WITH PROPOFOL;  Surgeon: Lucilla Lame, MD;  Location: [facility] ENDOSCOPY;  Service: Endoscopy;  Laterality: N/A;   CORONARY ARTERY BYPASS GRAFT N/A 11/26/2019   Procedure: CORONARY ARTERY BYPASS GRAFTING (CABG) X Two , using left internal mammary artery and right leg greater saphenous vein harvested endoscopically.;  Surgeon: Gaye Pollack, MD;  Location: [place] OR;  Service: Open Heart Surgery;  Laterality: N/A;   LEFT HEART CATH AND CORONARY ANGIOGRAPHY Left 10/19/2019   Procedure: LEFT HEART CATH AND CORONARY ANGIOGRAPHY;  Surgeon: Minna Merritts, MD;  Location: [place] CV LAB;  Service: Cardiovascular;  Laterality: Left;   NO PAST SURGERIES     RIGHT HEART CATH N/A 10/19/2019   Procedure: RIGHT HEART CATH;  Surgeon: Minna Merritts, MD;  Location: [place] [facility];  Service: Cardiovascular;  Laterality:  N/A;   SHOULDER ARTHROSCOPY WITH OPEN ROTATOR CUFF REPAIR Right 07/26/2017   Procedure: SHOULDER ARTHROSCOPY WITH OPEN ROTATOR CUFF REPAIR,SUBACROMINAL DECOMPRESSION,DISTAL CLAVICLE EXCISION;  Surgeon: Thornton Park, MD;  Location: [facility] ORS;  Service: Orthopedics;  Laterality: Right;   TEE WITHOUT CARDIOVERSION N/A 11/26/2019   Procedure: TRANSESOPHAGEAL ECHOCARDIOGRAM (TEE);  Surgeon: Gaye Pollack, MD;  Location: [place];  Service: Open Heart Surgery;  Laterality: N/A;   Social History   Socioeconomic History   Marital status: Married    Spouse name: Not on file   Number of children: Not on file   Years of education: Not on file   Highest education level: Not on file  Occupational History   Not on file  Tobacco Use   Smoking status: Never   Smokeless tobacco: Never  Vaping Use   Vaping Use: Never used  Substance and Sexual Activity   Alcohol use: Yes    Alcohol/week: 0.0 standard drinks    Comment: social   Drug use: No   Sexual activity: Not on file  Other Topics Concern   Not on file  Social History Narrative   Not on file   Social Determinants of Health   Financial Resource Strain: Not on file  Food Insecurity: Not on file  Transportation Needs: Not on file  Physical Activity: Not on file  Stress: Not on file  Social Connections: Not  on file  Intimate Partner Violence: Not on file   Family Status  Relation Name Status   Mother  Alive   Father  Deceased   Sister Daralene Milch   Brother  Alive   Family History  Problem Relation Age of Onset   Asthma Mother    Cancer Father        possibly in liver and lung   Hypertension Brother    Allergies  Allergen Reactions   Penicillins Hives, Swelling and Other (See Comments)    Has patient had a PCN reaction causing immediate rash, facial/tongue/throat swelling, SOB or lightheadedness with hypotension: Unknown Has patient had a PCN reaction causing severe rash involving mucus membranes or skin necrosis: No Has  patient had a PCN reaction that required hospitalization: no Has patient had a PCN reaction occurring within the last 10 years: No If all of the above answers are "NO", then may proceed with Cephalosporin use.     Patient Care Team: Jerrol Banana., MD as PCP - General (Family Medicine) Minna Merritts, MD as PCP - Cardiology (Cardiology)   Medications: Outpatient Medications Prior to Visit  Medication Sig   acetaminophen (TYLENOL) 500 MG tablet Take 1,000 mg by mouth every 6 (six) hours as needed for moderate pain or headache.   albuterol (PROAIR HFA) 108 (90 Base) MCG/ACT inhaler Inhale 1 puff into the lungs every 6 (six) hours as needed for wheezing or shortness of breath.   allopurinol (ZYLOPRIM) 300 MG tablet TAKE 1 TABLET BY MOUTH EVERY DAY   aspirin EC 81 MG tablet Take 81 mg by mouth daily. Swallow whole.   BD PEN NEEDLE NANO 2ND GEN 32G X 4 MM MISC USE AS DIRECTED   Cholecalciferol (VITAMIN D) 2000 units CAPS Take 2,000 Units by mouth every evening.    empagliflozin (JARDIANCE) 10 MG TABS tablet Take 1 tablet (10 mg total) by mouth daily before breakfast.   ezetimibe (ZETIA) 10 MG tablet TAKE 1 TABLET BY MOUTH EVERY DAY   glucose blood (ONETOUCH VERIO) test strip Use as instructed; BID   insulin degludec (TRESIBA FLEXTOUCH) 100 UNIT/ML FlexTouch Pen INJECT 25 UNITS INTO THE SKIN DAILY AT 10 PM.   losartan (COZAAR) 50 MG tablet TAKE 1 TABLET BY MOUTH EVERY DAY   magnesium oxide (MAG-OX) 400 (240 Mg) MG tablet TAKE 1 TABLET BY MOUTH TWICE A DAY   magnesium oxide (MAG-OX) 400 MG tablet Take 400 mg by mouth 2 (two) times daily.    metFORMIN (GLUCOPHAGE) 1000 MG tablet TAKE 1 TABLET (1,000 MG TOTAL) BY MOUTH TWICE A DAY WITH FOOD   metoprolol tartrate (LOPRESSOR) 25 MG tablet Take 1 tablet (25 mg total) by mouth 2 (two) times daily.   nystatin cream (MYCOSTATIN) APPLY TO AFFECTED AREA TWICE A DAY   OneTouch Delica Lancets 90Z MISC Use 3 times a day   rosuvastatin (CRESTOR) 5  MG tablet Take 1 tablet (5 mg total) by mouth daily.   Semaglutide,0.25 or 0.'5MG'$ /DOS, (OZEMPIC, 0.25 OR 0.5 MG/DOSE,) 2 MG/1.5ML SOPN Inject 0.5 mg into the skin once a week.   sildenafil (REVATIO) 20 MG tablet Take 1 tablet (20 mg total) by mouth 3 (three) times daily as needed.   tamsulosin (FLOMAX) 0.4 MG CAPS capsule Take 1 capsule (0.4 mg total) by mouth daily.   traMADol (ULTRAM) 50 MG tablet Take 50 mg by mouth every 6 (six) hours as needed.   icosapent Ethyl (VASCEPA) 1 g capsule Take 2 capsules (2 g total)  by mouth 2 (two) times daily.   No facility-administered medications prior to visit.    Review of Systems  Constitutional: Negative.   HENT: Negative.    Eyes: Negative.   Respiratory: Negative.    Cardiovascular: Negative.   Gastrointestinal: Negative.   Endocrine: Negative.   Genitourinary:  Positive for urgency.  Musculoskeletal:  Positive for back pain.  Skin: Negative.   Allergic/Immunologic: Negative.   Neurological: Negative.   Hematological: Negative.   Psychiatric/Behavioral: Negative.     Last hemoglobin A1c Lab Results  Component Value Date   HGBA1C 8.6 (H) 11/25/2021      Objective     BP 115/70 (BP Location: Right Arm, Patient Position: Sitting, Cuff Size: Large)   Pulse 73   Temp 98.1 F (36.7 C) (Oral)   Wt 244 lb (110.7 kg)   SpO2 96%   BMI 35.01 kg/m  BP Readings from Last 3 Encounters:  11/25/21 115/70  11/23/21 118/60  10/06/21 (!) 120/56   Wt Readings from Last 3 Encounters:  11/25/21 244 lb (110.7 kg)  11/23/21 246 lb 4 oz (111.7 kg)  10/06/21 247 lb (112 kg)       Physical Exam Constitutional:      Appearance: He is obese.  HENT:     Head: Normocephalic and atraumatic.     Right Ear: Tympanic membrane, ear canal and external ear normal.     Left Ear: Tympanic membrane, ear canal and external ear normal.     Nose: Nose normal.     Mouth/Throat:     Mouth: Mucous membranes are moist.     Pharynx: Oropharynx is clear.   Eyes:     Extraocular Movements: Extraocular movements intact.     Conjunctiva/sclera: Conjunctivae normal.     Pupils: Pupils are equal, round, and reactive to light.  Cardiovascular:     Rate and Rhythm: Normal rate and regular rhythm.     Pulses: Normal pulses.     Heart sounds: Normal heart sounds.  Pulmonary:     Effort: Pulmonary effort is normal.     Breath sounds: Normal breath sounds.  Abdominal:     General: Bowel sounds are normal.     Palpations: Abdomen is soft.  Musculoskeletal:     Cervical back: Normal range of motion and neck supple.  Skin:    General: Skin is warm and dry.  Neurological:     General: No focal deficit present.     Mental Status: He is alert and oriented to person, place, and time. Mental status is at baseline.  Psychiatric:        Mood and Affect: Mood normal.        Behavior: Behavior normal.        Thought Content: Thought content normal.        Judgment: Judgment normal.       Last depression screening scores    11/25/2021    9:20 AM 10/06/2021    2:52 PM 07/15/2021    9:20 AM  PHQ 2/9 Scores  PHQ - 2 Score 0 0 0  PHQ- 9 Score 1  1   Last fall risk screening    11/25/2021    9:19 AM  Post in the past year? 0   Last Audit-C alcohol use screening    11/25/2021    9:19 AM  Alcohol Use Disorder Test (AUDIT)  1. How often do you have a drink containing alcohol? 1  2. How many  drinks containing alcohol do you have on a typical day when you are drinking? 0  3. How often do you have six or more drinks on one occasion? 0  AUDIT-C Score 1   A score of 3 or more in women, and 4 or more in men indicates increased risk for alcohol abuse, EXCEPT if all of the points are from question 1   No results found for any visits on 11/25/21.  Assessment & Plan    Routine Health Maintenance and Physical Exam  Exercise Activities and Dietary recommendations  Goals   None     Immunization History  Administered Date(s)  Administered   Influenza Split 07/05/2011   Influenza,inj,Quad PF,6+ Mos 05/28/2013, 04/18/2018, 04/01/2020   Influenza-Unspecified 04/29/2017, 04/21/2021   PFIZER(Purple Top)SARS-COV-2 Vaccination 09/19/2019, 10/10/2019   Pneumococcal Polysaccharide-23 04/29/2017   Td 12/30/2003   Tdap 05/23/2017   Zoster Recombinat (Shingrix) 03/10/2019, 06/01/2019    Health Maintenance  Topic Date Due   FOOT EXAM  02/25/2021   COVID-19 Vaccine (3 - Pfizer risk series) 07/31/2022 (Originally 11/07/2019)   HEMOGLOBIN A1C  01/12/2022   INFLUENZA VACCINE  01/19/2022   OPHTHALMOLOGY EXAM  03/26/2022   COLONOSCOPY (Pts 45-75yr Insurance coverage will need to be confirmed)  08/23/2024   TETANUS/TDAP  05/24/2027   Hepatitis C Screening  Completed   HIV Screening  Completed   Zoster Vaccines- Shingrix  Completed   HPV VACCINES  Aged Out    Discussed health benefits of physical activity, and encouraged him to engage in regular exercise appropriate for his age and condition.  1. Annual physical exam Get sleep study for possible sleep apnea. - Hemoglobin A1c - Comprehensive metabolic panel - CBC with Differential/Platelet - TSH  2. Prostate cancer screening  - PSA  3. Type 2 diabetes mellitus with other specified complication, without long-term current use of insulin (HCC) Diet and exercise for continuing weight loss required for his morbid obesity. - Hemoglobin A1c - Comprehensive metabolic panel - CBC with Differential/Platelet - TSH - Microalbumin, urine  4. Screening for blood or protein in urine  - POCT urinalysis dipstick  5. Primary hypertension  - Hemoglobin A1c - Comprehensive metabolic panel - CBC with Differential/Platelet - TSH - Microalbumin, urine  6. Mixed hyperlipidemia  - Hemoglobin A1c - Comprehensive metabolic panel - CBC with Differential/Platelet - TSH  7. CAD in native artery Status post CABG - Hemoglobin A1c - Comprehensive metabolic panel - CBC with  Differential/Platelet - TSH   No follow-ups on file.     I, RWilhemena Durie MD, have reviewed all documentation for this visit. The documentation on 12/04/21 for the exam, diagnosis, procedures, and orders are all accurate and complete.    Ireta Pullman GCranford Mon MD  [phone](phone) [phone](fax)  [place]

## 2021-11-26 LAB — CBC WITH DIFFERENTIAL/PLATELET
Basophils Absolute: 0.1 10*3/uL (ref 0.0–0.2)
Basos: 1 %
EOS (ABSOLUTE): 0.1 10*3/uL (ref 0.0–0.4)
Eos: 2 %
Hematocrit: 48.8 % (ref 37.5–51.0)
Hemoglobin: 16.3 g/dL (ref 13.0–17.7)
Immature Grans (Abs): 0.1 10*3/uL (ref 0.0–0.1)
Immature Granulocytes: 1 %
Lymphocytes Absolute: 2.5 10*3/uL (ref 0.7–3.1)
Lymphs: 33 %
MCH: 29.2 pg (ref 26.6–33.0)
MCHC: 33.4 g/dL (ref 31.5–35.7)
MCV: 87 fL (ref 79–97)
Monocytes Absolute: 0.6 10*3/uL (ref 0.1–0.9)
Monocytes: 8 %
Neutrophils Absolute: 4.1 10*3/uL (ref 1.4–7.0)
Neutrophils: 55 %
Platelets: 152 10*3/uL (ref 150–450)
RBC: 5.59 x10E6/uL (ref 4.14–5.80)
RDW: 14.5 % (ref 11.6–15.4)
WBC: 7.5 10*3/uL (ref 3.4–10.8)

## 2021-11-26 LAB — COMPREHENSIVE METABOLIC PANEL
ALT: 19 IU/L (ref 0–44)
AST: 17 IU/L (ref 0–40)
Albumin/Globulin Ratio: 1.8 (ref 1.2–2.2)
Albumin: 4.8 g/dL (ref 3.8–4.8)
Alkaline Phosphatase: 70 IU/L (ref 44–121)
BUN/Creatinine Ratio: 18 (ref 10–24)
BUN: 17 mg/dL (ref 8–27)
Bilirubin Total: 0.6 mg/dL (ref 0.0–1.2)
CO2: 26 mmol/L (ref 20–29)
Calcium: 10.3 mg/dL — ABNORMAL HIGH (ref 8.6–10.2)
Chloride: 99 mmol/L (ref 96–106)
Creatinine, Ser: 0.96 mg/dL (ref 0.76–1.27)
Globulin, Total: 2.7 g/dL (ref 1.5–4.5)
Glucose: 159 mg/dL — ABNORMAL HIGH (ref 70–99)
Potassium: 5 mmol/L (ref 3.5–5.2)
Sodium: 139 mmol/L (ref 134–144)
Total Protein: 7.5 g/dL (ref 6.0–8.5)
eGFR: 88 mL/min/{1.73_m2} (ref >59)

## 2021-11-26 LAB — HEMOGLOBIN A1C
Est. average glucose Bld gHb Est-mCnc: 200 mg/dL
Hgb A1c MFr Bld: 8.6 % — ABNORMAL HIGH (ref 4.8–5.6)

## 2021-11-26 LAB — TSH: TSH: 2.14 u[IU]/mL (ref 0.450–4.500)

## 2021-11-26 LAB — MICROALBUMIN, URINE: Microalbumin, Urine: 4.9 ug/mL

## 2021-11-26 LAB — PSA: Prostate Specific Ag, Serum: 1.9 ng/mL (ref 0.0–4.0)

## 2021-12-03 ENCOUNTER — Other Ambulatory Visit: Payer: Self-pay | Admitting: Physician Assistant

## 2021-12-03 DIAGNOSIS — J40 Bronchitis, not specified as acute or chronic: Secondary | ICD-10-CM

## 2021-12-03 DIAGNOSIS — R051 Acute cough: Secondary | ICD-10-CM

## 2021-12-03 NOTE — Telephone Encounter (Signed)
Requested Prescriptions  Pending Prescriptions Disp Refills  . albuterol (VENTOLIN HFA) 108 (90 Base) MCG/ACT inhaler [Pharmacy Med Name: ALBUTEROL HFA (PROAIR) INHALER] 8.5 each 3    Sig: INHALE 1 PUFF INTO THE LUNGS EVERY 6 HOURS AS NEEDED FOR WHEEZING OR SHORTNESS OF BREATH.     Pulmonology:  Beta Agonists 2 Passed - 12/03/2021  1:53 AM      Passed - Last BP in normal range    BP Readings from Last 1 Encounters:  11/25/21 115/70         Passed - Last Heart Rate in normal range    Pulse Readings from Last 1 Encounters:  11/25/21 73         Passed - Valid encounter within last 12 months    Recent Outpatient Visits          1 week ago Annual physical exam   [facility] Jerrol Banana., MD   1 month ago Acute cough   [place], Dani Gobble, PA-C   4 months ago Type 2 diabetes mellitus with other specified complication, without long-term current use of insulin [facility])   [facility] Rory Percy M, DO   9 months ago Type 2 diabetes mellitus with other specified complication, without long-term current use of insulin [facility])   [facility] Jerrol Banana., MD   1 year ago Annual physical exam   [facility] Jerrol Banana., MD      Future Appointments            In 3 months Jerrol Banana., MD [facility], [place]

## 2021-12-23 ENCOUNTER — Encounter: Payer: Self-pay | Admitting: *Deleted

## 2021-12-23 DIAGNOSIS — Z006 Encounter for examination for normal comparison and control in clinical research program: Secondary | ICD-10-CM

## 2021-12-23 NOTE — Research (Signed)
Message left for Mr. Welty about [employer] research study. Encouraged him to call back for more information.

## 2021-12-25 ENCOUNTER — Other Ambulatory Visit: Payer: Self-pay | Admitting: Family Medicine

## 2021-12-25 DIAGNOSIS — E1169 Type 2 diabetes mellitus with other specified complication: Secondary | ICD-10-CM

## 2021-12-25 DIAGNOSIS — I10 Essential (primary) hypertension: Secondary | ICD-10-CM

## 2021-12-25 NOTE — Telephone Encounter (Signed)
Requested Prescriptions  Pending Prescriptions Disp Refills  . JARDIANCE 10 MG TABS tablet [Pharmacy Med Name: JARDIANCE 10 MG TABLET] 90 tablet 0    Sig: TAKE 1 TABLET BY MOUTH DAILY BEFORE BREAKFAST.     Endocrinology:  Diabetes - SGLT2 Inhibitors Failed - 12/25/2021  1:16 PM      Failed - HBA1C is between 0 and 7.9 and within 180 days    Hgb A1c MFr Bld  Date Value Ref Range Status  11/25/2021 8.6 (H) 4.8 - 5.6 % Final    Comment:             Prediabetes: 5.7 - 6.4          Diabetes: >6.4          Glycemic control for adults with diabetes: <7.0          Passed - Cr in normal range and within 360 days    Creat  Date Value Ref Range Status  05/23/2017 0.77 0.70 - 1.25 mg/dL Final    Comment:    For patients >37 years of age, the reference limit for Creatinine is approximately 13% higher for people identified as African-American. .    Creatinine, Ser  Date Value Ref Range Status  11/25/2021 0.96 0.76 - 1.27 mg/dL Final         Passed - eGFR in normal range and within 360 days    GFR, Est African American  Date Value Ref Range Status  05/23/2017 114 > OR = 60 mL/min/1.22m Final   GFR calc Af Amer  Date Value Ref Range Status  12/18/2019 96 >59 mL/min/1.73 Final    Comment:    **[facility] currently reports eGFR in compliance with the current**   recommendations of the [employer] [facility] will   update reporting as new guidelines are published from the NKF-ASN   Task force.    GFR, Est Non African American  Date Value Ref Range Status  05/23/2017 99 > OR = 60 mL/min/1.777mFinal   GFR calc non Af Amer  Date Value Ref Range Status  12/18/2019 83 >59 mL/min/1.73 Final   eGFR  Date Value Ref Range Status  11/25/2021 88 >59 mL/min/1.73 Final         Passed - Valid encounter within last 6 months    Recent Outpatient Visits          1 month ago [facility] physical exam   [facility]   2 months ago Acute cough    [facility] GobblePA-C   5 months ago Type 2 diabetes mellitus with other specified complication, [facility]   10 months ago Type 2 diabetes mellitus with other specified complication, without long-term current use of insulin ([facility]  [facility]   1 year ago Annual physical exam   [facility]      Future Appointments            In 2 months GiJerrol Banana MD [facility]

## 2021-12-25 NOTE — Telephone Encounter (Signed)
Requested Prescriptions  Pending Prescriptions Disp Refills  . losartan (COZAAR) 50 MG tablet [Pharmacy Med Name: LOSARTAN POTASSIUM 50 MG TAB] 90 tablet 1    Sig: TAKE 1 TABLET BY MOUTH EVERY DAY     Cardiovascular:  Angiotensin Receptor Blockers Passed - 12/25/2021  1:16 PM      Passed - Cr in normal range and within 180 days    Creat  Date Value Ref Range Status  05/23/2017 0.77 0.70 - 1.25 mg/dL Final    Comment:    For patients >65 years of age, the reference limit for Creatinine is approximately 13% higher for people identified as African-American. .    Creatinine, Ser  Date Value Ref Range Status  11/25/2021 0.96 0.76 - 1.27 mg/dL Final         Passed - K in normal range and within 180 days    Potassium  Date Value Ref Range Status  11/25/2021 5.0 3.5 - 5.2 mmol/L Final         Passed - Patient is not pregnant      Passed - Last BP in normal range    BP Readings from Last 1 Encounters:  11/25/21 115/70         Passed - Valid encounter within last 6 months    Recent Outpatient Visits          1 month ago Annual physical exam   [facility] Jerrol Banana., MD   2 months ago Acute cough   [place], Dani Gobble, PA-C   5 months ago Type 2 diabetes mellitus with other specified complication, without long-term current use of insulin [facility])   [facility] Rory Percy M, DO   10 months ago Type 2 diabetes mellitus with other specified complication, without long-term current use of insulin [facility])   [facility] Jerrol Banana., MD   1 year ago Annual physical exam   [facility] Jerrol Banana., MD      Future Appointments            In 2 months Jerrol Banana., MD [facility], [place]

## 2022-02-01 ENCOUNTER — Other Ambulatory Visit: Payer: Self-pay | Admitting: Family Medicine

## 2022-02-01 DIAGNOSIS — E119 Type 2 diabetes mellitus without complications: Secondary | ICD-10-CM

## 2022-02-08 ENCOUNTER — Ambulatory Visit: Payer: Managed Care, Other (non HMO) | Admitting: Family Medicine

## 2022-02-08 ENCOUNTER — Ambulatory Visit (INDEPENDENT_AMBULATORY_CARE_PROVIDER_SITE_OTHER): Payer: Managed Care, Other (non HMO) | Admitting: Family Medicine

## 2022-02-08 VITALS — BP 109/63 | HR 70 | Temp 98.6°F

## 2022-02-08 DIAGNOSIS — Z952 Presence of prosthetic heart valve: Secondary | ICD-10-CM

## 2022-02-08 DIAGNOSIS — U071 COVID-19: Secondary | ICD-10-CM | POA: Diagnosis not present

## 2022-02-08 DIAGNOSIS — Z951 Presence of aortocoronary bypass graft: Secondary | ICD-10-CM

## 2022-02-08 DIAGNOSIS — E1169 Type 2 diabetes mellitus with other specified complication: Secondary | ICD-10-CM

## 2022-02-08 NOTE — Progress Notes (Unsigned)
Established patient visit   Patient: Jeffery West   DOB: 02-17-1956   66 y.o. Male  MRN: [id number] Visit Date: 02/08/2022  Today's healthcare provider: [name], MD   No chief complaint on file.  Subjective    HPI  Patient is a 66 year old male who presents for evaluation for Covid, congestion and sore throat.  Patient states that on 02/03/22 he began having symptoms of headache, sinus pressure, scratchy throat and fatigue. Patient has been using OTC sinus/allergy type medication without relief.  He then began OTC multisystem cough and cold med.  This helped his symptoms some but on 02/06/22 he had more difficulty swallowing.   On on 02/07/22 he tested positive for covid.  He is almost well from this COVID infection. Medications: Outpatient Medications Prior to Visit  Medication Sig   acetaminophen (TYLENOL) 500 MG tablet Take 1,000 mg by mouth every 6 (six) hours as needed for moderate pain or headache.   albuterol (VENTOLIN HFA) 108 (90 Base) MCG/ACT inhaler INHALE 1 PUFF INTO THE LUNGS EVERY 6 HOURS AS NEEDED FOR WHEEZING OR SHORTNESS OF BREATH.   allopurinol (ZYLOPRIM) 300 MG tablet TAKE 1 TABLET BY MOUTH EVERY DAY   aspirin EC 81 MG tablet Take 81 mg by mouth daily. Swallow whole.   BD PEN NEEDLE NANO 2ND GEN 32G X 4 MM MISC USE AS DIRECTED   Cholecalciferol (VITAMIN D) 2000 units CAPS Take 2,000 Units by mouth every evening.    ezetimibe (ZETIA) 10 MG tablet TAKE 1 TABLET BY MOUTH EVERY DAY   glucose blood (ONETOUCH VERIO) test strip Use as instructed; BID   icosapent Ethyl (VASCEPA) 1 g capsule Take 2 capsules (2 g total) by mouth 2 (two) times daily.   insulin degludec (TRESIBA FLEXTOUCH) 100 UNIT/ML FlexTouch Pen INJECT 25 UNITS INTO THE SKIN DAILY AT 10 PM.   JARDIANCE 10 MG TABS tablet TAKE 1 TABLET BY MOUTH DAILY BEFORE BREAKFAST.   losartan (COZAAR) 50 MG tablet TAKE 1 TABLET BY MOUTH EVERY DAY   magnesium oxide (MAG-OX) 400 (240 Mg) MG tablet TAKE 1  TABLET BY MOUTH TWICE A DAY   magnesium oxide (MAG-OX) 400 MG tablet Take 400 mg by mouth 2 (two) times daily.    metFORMIN (GLUCOPHAGE) 1000 MG tablet TAKE 1 TABLET (1,000 MG TOTAL) BY MOUTH TWICE A DAY WITH FOOD   metoprolol tartrate (LOPRESSOR) 25 MG tablet Take 1 tablet (25 mg total) by mouth 2 (two) times daily.   nystatin cream (MYCOSTATIN) APPLY TO AFFECTED AREA TWICE A DAY   OneTouch Delica Lancets 27O MISC Use 3 times a day   rosuvastatin (CRESTOR) 5 MG tablet Take 1 tablet (5 mg total) by mouth daily.   Semaglutide,0.25 or 0.'5MG'$ /DOS, (OZEMPIC, 0.25 OR 0.5 MG/DOSE,) 2 MG/1.5ML SOPN Inject 0.5 mg into the skin once a week.   sildenafil (REVATIO) 20 MG tablet Take 1 tablet (20 mg total) by mouth 3 (three) times daily as needed.   tamsulosin (FLOMAX) 0.4 MG CAPS capsule Take 1 capsule (0.4 mg total) by mouth daily.   traMADol (ULTRAM) 50 MG tablet Take 50 mg by mouth every 6 (six) hours as needed.   No facility-administered medications prior to visit.    Review of Systems  HENT:  Positive for ear pain (dull ache in left). Negative for congestion, ear discharge, postnasal drip, rhinorrhea, sinus pressure, sinus pain, sneezing, sore throat and tinnitus.   Respiratory:  Negative for cough, shortness of breath  and wheezing.   Cardiovascular:  Negative for chest pain.    Last hemoglobin A1c Lab Results  Component Value Date   HGBA1C 8.6 (H) 11/25/2021       Objective    BP 109/63 (BP Location: Left Arm, Patient Position: Sitting, Cuff Size: Normal)   Pulse 70   Temp 98.6 F (37 C) (Oral)   SpO2 97%  BP Readings from Last 3 Encounters:  02/08/22 109/63  11/25/21 115/70  11/23/21 118/60   Wt Readings from Last 3 Encounters:  11/25/21 244 lb (110.7 kg)  11/23/21 246 lb 4 oz (111.7 kg)  10/06/21 247 lb (112 kg)      Physical Exam Vitals reviewed.  Constitutional:      General: He is not in acute distress.    Appearance: He is well-developed.  HENT:     Head:  Normocephalic and atraumatic.     Right Ear: Hearing, tympanic membrane and external ear normal.     Left Ear: Hearing, tympanic membrane and external ear normal.     Nose: Nose normal.  Eyes:     General: Lids are normal. No scleral icterus.       Right eye: No discharge.        Left eye: No discharge.     Conjunctiva/sclera: Conjunctivae normal.  Cardiovascular:     Rate and Rhythm: Normal rate and regular rhythm.     Heart sounds: Normal heart sounds.  Pulmonary:     Effort: Pulmonary effort is normal. No respiratory distress.     Breath sounds: Normal breath sounds.  Skin:    Findings: No lesion or rash.  Neurological:     General: No focal deficit present.     Mental Status: He is alert and oriented to person, place, and time.  Psychiatric:        Mood and Affect: Mood normal.        Speech: Speech normal.        Behavior: Behavior normal.        Thought Content: Thought content normal.        Judgment: Judgment normal.       No results found for any visits on 02/08/22.  Assessment & Plan     1. COVID Patient has now 5 days out from his initial symptoms.  No treatment indicated as he is completely normal today in the exam Is having no shortness of breath and almost no cough 2. S/P AVR   3. S/P CABG (coronary artery bypass graft)   4. Type 2 diabetes mellitus with other specified complication, without long-term current use of insulin ([name])   5. Morbid obesity ([name])    No follow-ups on file.      I, [name], MD, have reviewed all documentation for this visit. The documentation on 02/10/22 for the exam, diagnosis, procedures, and orders are all accurate and complete.    Escher Harr Cranford Mon, MD  [facility] [phone] (phone) [phone] (fax)  [place]

## 2022-02-08 NOTE — Patient Instructions (Signed)
Vitamin C, Vitamin D and Zinc until symptoms are gone.

## 2022-02-19 ENCOUNTER — Other Ambulatory Visit: Payer: Self-pay | Admitting: Family Medicine

## 2022-02-19 DIAGNOSIS — E1169 Type 2 diabetes mellitus with other specified complication: Secondary | ICD-10-CM

## 2022-02-23 NOTE — Telephone Encounter (Signed)
Requested Prescriptions  Pending Prescriptions Disp Refills  . OZEMPIC, 0.25 OR 0.5 MG/DOSE, 2 MG/3ML SOPN [Pharmacy Med Name: OZEMPIC 0.25-0.5 MG/DOSE PEN] 1.5 mL 3    Sig: INJECT 0.5 MG INTO THE SKIN ONCE A WEEK.     Endocrinology:  Diabetes - GLP-1 Receptor Agonists - semaglutide Failed - 02/19/2022  9:07 AM      Failed - HBA1C in normal range and within 180 days    Hgb A1c MFr Bld  Date Value Ref Range Status  11/25/2021 8.6 (H) 4.8 - 5.6 % Final    Comment:             Prediabetes: 5.7 - 6.4          Diabetes: >6.4          Glycemic control for adults with diabetes: <7.0          Passed - Cr in normal range and within 360 days    Creat  Date Value Ref Range Status  05/23/2017 0.77 0.70 - 1.25 mg/dL Final    Comment:    For patients >62 years of age, the reference limit for Creatinine is approximately 13% higher for people identified as African-American. .    Creatinine, Ser  Date Value Ref Range Status  11/25/2021 0.96 0.76 - 1.27 mg/dL Final         Passed - Valid encounter within last 6 months    Recent Outpatient Visits          2 weeks ago [facility] Jerrol Banana., MD   3 months ago Annual physical exam   [facility] Jerrol Banana., MD   4 months ago Acute cough   [place], Dani Gobble, PA-C   7 months ago Type 2 diabetes mellitus with other specified complication, without long-term current use of insulin [facility])   [facility] Myles Gip, DO   1 year ago Type 2 diabetes mellitus with other specified complication, without long-term current use of insulin [facility])   [facility] Jerrol Banana., MD      Future Appointments            In 3 weeks Jerrol Banana., MD [facility]

## 2022-03-15 ENCOUNTER — Other Ambulatory Visit: Payer: Self-pay | Admitting: Family Medicine

## 2022-03-15 DIAGNOSIS — E119 Type 2 diabetes mellitus without complications: Secondary | ICD-10-CM

## 2022-03-22 ENCOUNTER — Ambulatory Visit: Payer: Managed Care, Other (non HMO) | Admitting: Family Medicine

## 2022-03-26 ENCOUNTER — Other Ambulatory Visit: Payer: Self-pay | Admitting: Family Medicine

## 2022-03-26 NOTE — Telephone Encounter (Signed)
Requested Prescriptions  Pending Prescriptions Disp Refills  . allopurinol (ZYLOPRIM) 300 MG tablet [Pharmacy Med Name: ALLOPURINOL 300 MG TABLET] 90 tablet 0    Sig: TAKE 1 TABLET BY MOUTH EVERY DAY     Endocrinology:  Gout Agents - allopurinol Failed - 03/26/2022  8:07 AM      Failed - Uric Acid in normal range and within 360 days    No results found for: "POCURA", "LABURIC"       Passed - Cr in normal range and within 360 days    Creat  Date Value Ref Range Status  05/23/2017 0.77 0.70 - 1.25 mg/dL Final    Comment:    For patients >16 years of age, the reference limit for Creatinine is approximately 13% higher for people identified as African-American. .    Creatinine, Ser  Date Value Ref Range Status  11/25/2021 0.96 0.76 - 1.27 mg/dL Final         Passed - Valid encounter within last 12 months    Recent Outpatient Visits          1 month ago [place] Jerrol Banana., MD   4 months ago Annual physical exam   [facility] Jerrol Banana[facility] MD   5 months ago Acute cough   [place], Dani Gobble, PA-C   8 months ago Type 2 diabetes mellitus with other specified complication, without long-term current use of insulin [facility])   [facility] Myles Gip, DO   1 year ago Type 2 diabetes mellitus with other specified complication, without long-term current use of insulin [facility])   [facility] Jerrol Banana., MD      Future Appointments            In 5 days Simmons-Robinson, Riki Sheer, MD [facility]           Passed - CBC within normal limits and completed in the last 12 months    WBC  Date Value Ref Range Status  11/25/2021 7.5 3.4 - 10.8 x10E3/uL Final  11/28/2019 12.7 (H) 4.0 - 10.5 K/uL Final   RBC  Date Value Ref Range Status  11/25/2021 5.59 4.14 - 5.80 x10E6/uL Final  11/28/2019 3.96 (L) 4.22 - 5.81 MIL/uL Final   Hemoglobin   Date Value Ref Range Status  11/25/2021 16.3 13.0 - 17.7 g/dL Final   Hematocrit  Date Value Ref Range Status  11/25/2021 48.8 37.5 - 51.0 % Final   MCHC  Date Value Ref Range Status  11/25/2021 33.4 31.5 - 35.7 g/dL Final  11/28/2019 32.6 30.0 - 36.0 g/dL Final   [facility]  Date Value Ref Range Status  11/25/2021 29.2 26.6 - 33.0 pg Final  11/28/2019 30.8 26.0 - 34.0 pg Final   MCV  Date Value Ref Range Status  11/25/2021 87 79 - 97 fL Final   No results found for: "PLTCOUNTKUC", "LABPLAT", "POCPLA" RDW  Date Value Ref Range Status  11/25/2021 14.5 11.6 - 15.4 % Final

## 2022-03-31 ENCOUNTER — Ambulatory Visit (INDEPENDENT_AMBULATORY_CARE_PROVIDER_SITE_OTHER): Payer: Managed Care, Other (non HMO) | Admitting: Family Medicine

## 2022-03-31 ENCOUNTER — Encounter: Payer: Self-pay | Admitting: Family Medicine

## 2022-03-31 VITALS — BP 114/69 | HR 80 | Temp 98.4°F | Resp 16 | Wt 240.2 lb

## 2022-03-31 DIAGNOSIS — R350 Frequency of micturition: Secondary | ICD-10-CM | POA: Diagnosis not present

## 2022-03-31 DIAGNOSIS — E1169 Type 2 diabetes mellitus with other specified complication: Secondary | ICD-10-CM

## 2022-03-31 DIAGNOSIS — N401 Enlarged prostate with lower urinary tract symptoms: Secondary | ICD-10-CM | POA: Insufficient documentation

## 2022-03-31 DIAGNOSIS — Z23 Encounter for immunization: Secondary | ICD-10-CM | POA: Diagnosis not present

## 2022-03-31 LAB — POCT GLYCOSYLATED HEMOGLOBIN (HGB A1C)
Est. average glucose Bld gHb Est-mCnc: 169
Hemoglobin A1C: 7.5 % — AB (ref 4.0–5.6)

## 2022-03-31 LAB — HM DIABETES EYE EXAM

## 2022-03-31 MED ORDER — TERAZOSIN HCL 1 MG PO CAPS: 1.0000 mg | ORAL_CAPSULE | Freq: Every day | ORAL | 0 refills | Status: DC

## 2022-03-31 MED ORDER — EMPAGLIFLOZIN 25 MG PO TABS: 25.0000 mg | ORAL_TABLET | Freq: Every day | ORAL | 1 refills | Status: DC

## 2022-03-31 NOTE — Progress Notes (Signed)
I,[name],acting as a scribe for [employer], MD.,have documented all relevant documentation on the behalf of Jeffery Foster, MD,as directed by  Jeffery Foster, MD while in the presence of Jeffery Foster, MD.   Established patient visit   Patient: Jeffery West   DOB: [birth year]   66 y.o. Male  MRN: [id number] Visit Date: 03/31/2022  Today's healthcare provider: [name], MD   Chief Complaint  Patient presents with   follow-up DM   Subjective    HPI  Diabetes Mellitus Type II, Follow-up  Lab Results  Component Value Date   HGBA1C 7.5 (A) 03/31/2022   HGBA1C 8.6 (H) 11/25/2021   HGBA1C 9.4 (A) 07/15/2021   Wt Readings from Last 3 Encounters:  03/31/22 240 lb 3.2 oz (109 kg)  11/25/21 244 lb (110.7 kg)  11/23/21 246 lb 4 oz (111.7 kg)   Last seen for diabetes 4 months ago.  Management since then includes continue current medication treatment. He reports excellent compliance with treatment. He is not having side effects.    Symptoms: No fatigue No foot ulcerations  No appetite changes No nausea  No paresthesia of the feet  No polydipsia  No polyuria No visual disturbances   No vomiting     Home blood sugar records:  4 weeks ago it was 135, not checking daily  Episodes of hypoglycemia? No    Current insulin regiment: Tresiba 25 units Most Recent Eye Exam: Done today Current exercise: none Current diet habits: regular  Pertinent Labs: Lab Results  Component Value Date   CHOL 134 07/15/2021   HDL 33 (L) 07/15/2021   LDLCALC 60 07/15/2021   TRIG 254 (H) 07/15/2021   CHOLHDL 4.1 07/15/2021   Lab Results  Component Value Date   NA 139 11/25/2021   K 5.0 11/25/2021   CREATININE 0.96 11/25/2021   EGFR 88 11/25/2021   MICROALBUR 50 02/26/2020   LABMICR <3.0 03/31/2022     ---------------------------------------------------------------------------------------------------    BPH Patient mentions urinary symptoms related to BPH  States that he has tried flomax but this made him feel like he had increased urinary frequency  He denies pain with urination, lower abdominal pain, CVA tenderness, fevers, chills, hematuria   Medications: Outpatient Medications Prior to Visit  Medication Sig   acetaminophen (TYLENOL) 500 MG tablet Take 1,000 mg by mouth every 6 (six) hours as needed for moderate pain or headache.   albuterol (VENTOLIN HFA) 108 (90 Base) MCG/ACT inhaler INHALE 1 PUFF INTO THE LUNGS EVERY 6 HOURS AS NEEDED FOR WHEEZING OR SHORTNESS OF BREATH.   allopurinol (ZYLOPRIM) 300 MG tablet TAKE 1 TABLET BY MOUTH EVERY DAY   aspirin EC 81 MG tablet Take 81 mg by mouth daily. Swallow whole.   BD PEN NEEDLE NANO 2ND GEN 32G X 4 MM MISC USE AS DIRECTED   Cholecalciferol (VITAMIN D) 2000 units CAPS Take 2,000 Units by mouth every evening.    ezetimibe (ZETIA) 10 MG tablet TAKE 1 TABLET BY MOUTH EVERY DAY   glucose blood (ONETOUCH VERIO) test strip Use as instructed; BID   insulin degludec (TRESIBA FLEXTOUCH) 100 UNIT/ML FlexTouch Pen INJECT 25 UNITS INTO THE SKIN DAILY AT 10 PM.   losartan (COZAAR) 50 MG tablet TAKE 1 TABLET BY MOUTH EVERY DAY   magnesium oxide (MAG-OX) 400 (240 Mg) MG tablet TAKE 1 TABLET BY MOUTH TWICE A DAY   magnesium oxide (MAG-OX) 400 MG tablet Take 400 mg by mouth 2 (two) times daily.  metFORMIN (GLUCOPHAGE) 1000 MG tablet TAKE 1 TABLET (1,000 MG TOTAL) BY MOUTH TWICE A DAY WITH FOOD   metoprolol tartrate (LOPRESSOR) 25 MG tablet Take 1 tablet (25 mg total) by mouth 2 (two) times daily.   nystatin cream (MYCOSTATIN) APPLY TO AFFECTED AREA TWICE A DAY   OneTouch Delica Lancets 86P MISC Use 3 times a day   OZEMPIC, 0.25 OR 0.5 MG/DOSE, 2 MG/3ML SOPN INJECT 0.5 MG INTO THE SKIN ONCE A WEEK.   rosuvastatin (CRESTOR) 5 MG tablet Take 1 tablet (5 mg total) by mouth daily.   sildenafil (REVATIO) 20 MG tablet Take 1 tablet (20 mg total) by mouth 3  (three) times daily as needed.   traMADol (ULTRAM) 50 MG tablet Take 50 mg by mouth every 6 (six) hours as needed.   [DISCONTINUED] JARDIANCE 10 MG TABS tablet TAKE 1 TABLET BY MOUTH DAILY BEFORE BREAKFAST.   [DISCONTINUED] tamsulosin (FLOMAX) 0.4 MG CAPS capsule Take 1 capsule (0.4 mg total) by mouth daily.   icosapent Ethyl (VASCEPA) 1 g capsule Take 2 capsules (2 g total) by mouth 2 (two) times daily.   No facility-administered medications prior to visit.    Review of Systems     Objective    BP 114/69 (BP Location: Left Arm, Patient Position: Sitting, Cuff Size: Large)   Pulse 80   Temp 98.4 F (36.9 C) (Oral)   Resp 16   Wt 240 lb 3.2 oz (109 kg)   BMI 34.47 kg/m    Physical Exam Vitals reviewed.  Constitutional:      General: He is not in acute distress.    Appearance: Normal appearance. He is not ill-appearing, toxic-appearing or diaphoretic.  Eyes:     Conjunctiva/sclera: Conjunctivae normal.  Cardiovascular:     Rate and Rhythm: Normal rate and regular rhythm.     Pulses: Normal pulses.     Heart sounds: Murmur heard.     No friction rub. No gallop.  Pulmonary:     Effort: Pulmonary effort is normal. No respiratory distress.     Breath sounds: Normal breath sounds. No stridor. No wheezing, rhonchi or rales.  Musculoskeletal:        General: No tenderness.     Right lower leg: No edema.     Left lower leg: No edema.  Feet:     Comments: Tinea pedis on left great toe  Neurological:     Mental Status: He is alert and oriented to person, place, and time.       Results for orders placed or performed in visit on 03/31/22  Urine microalbumin-creatinine with uACR  Result Value Ref Range   Creatinine, Urine 49.7 Not Estab. mg/dL   Microalbumin, Urine <3.0 Not Estab. ug/mL   Microalb/Creat Ratio <6 0 - 29 mg/g creat  Specimen status report  Result Value Ref Range   specimen status report Comment   POCT glycosylated hemoglobin (Hb A1C)  Result Value Ref  Range   Hemoglobin A1C 7.5 (A) 4.0 - 5.6 %   Est. average glucose Bld gHb Est-mCnc 169     Assessment & Plan     Problem List Items Addressed This Visit       Endocrine   Diabetes mellitus ([place]) - Primary    Chronic, improved, stable  Hgb down from 8.1 to 7.6  We will increase jardiance from 55m to 259mdaily  hgb a1c was repeated today as well as urine microalbumin and DM foot exam  F/u in 1 month  Relevant Medications   empagliflozin (JARDIANCE) 25 MG TABS tablet   Other Relevant Orders   POCT glycosylated hemoglobin (Hb A1C) (Completed)   Urine microalbumin-creatinine with uACR (Completed)     Other   Need for influenza vaccination    Influenza vaccine adminstered today       Relevant Orders   Flu Vaccine QUAD High Dose(Fluad) (Completed)   Benign prostatic hyperplasia with urinary frequency    Chronic  Will add terazosin 53m at bedtime to help with urinary frequency  Reports that flomax did not help symptoms in the past If no improvement, recommended urology referral at 1 month follow up, patient was agreeable        Relevant Medications   terazosin (HYTRIN) 1 MG capsule     Return in about 1 month (around 05/01/2022).      I, MEulis Foster MD, have reviewed all documentation for this visit. The documentation on 04/01/22 for the exam, diagnosis, procedures, and orders are all accurate and complete.    MEulis Foster MD  [phone](phone) [phone](fax)  [place]

## 2022-04-01 ENCOUNTER — Encounter: Payer: Self-pay | Admitting: Family Medicine

## 2022-04-01 LAB — MICROALBUMIN / CREATININE URINE RATIO
Creatinine, Urine: 49.7 mg/dL
Microalb/Creat Ratio: <6 mg/g creat (ref 0–29)
Microalbumin, Urine: <3 ug/mL

## 2022-04-01 LAB — SPECIMEN STATUS REPORT

## 2022-04-01 NOTE — Assessment & Plan Note (Signed)
Chronic  Will add terazosin '1mg'$  at bedtime to help with urinary frequency  Reports that flomax did not help symptoms in the past If no improvement, recommended urology referral at 1 month follow up, patient was agreeable

## 2022-04-01 NOTE — Assessment & Plan Note (Addendum)
Chronic, improved, stable  Hgb down from 8.1 to 7.6  We will increase jardiance from '10mg'$  to '25mg'$  daily  hgb a1c was repeated today as well as urine microalbumin and DM foot exam  F/u in 1 month

## 2022-04-01 NOTE — Assessment & Plan Note (Signed)
Influenza vaccine adminstered today

## 2022-04-08 ENCOUNTER — Other Ambulatory Visit: Payer: Self-pay | Admitting: Family Medicine

## 2022-04-09 NOTE — Telephone Encounter (Signed)
Requested medications are due for refill today.  yes  Requested medications are on the active medications list.  yes  Last refill. 02/09/2021 30 g 6 rf  Future visit scheduled.   yes  Notes to clinic.  Medication not assigned to a protocol    Requested Prescriptions  Pending Prescriptions Disp Refills   nystatin cream (MYCOSTATIN) [Pharmacy Med Name: NYSTATIN 100,000 UNIT/GM CREAM] 30 g 6    Sig: APPLY TO AFFECTED AREA TWICE A DAY     Off-Protocol Failed - 04/08/2022  5:14 PM      Failed - Medication not assigned to a protocol, review manually.      Passed - Valid encounter within last 12 months    Recent Outpatient Visits           1 week ago Type 2 diabetes mellitus with other specified complication, without long-term current use of insulin ([place])   [facility] Simmons-Robinson, [place], MD   2 months ago COVID   [facility] Jerrol Banana., MD   4 months ago Annual physical exam   [facility]   6 months ago Acute cough   [place], Dani Gobble, PA-C   8 months ago Type 2 diabetes mellitus with other specified complication, without long-term current use of insulin [facility])   [place], Jake Church, DO       Future Appointments             In 3 weeks Simmons-Robinson, Riki Sheer, MD [facility] [place]

## 2022-04-15 ENCOUNTER — Other Ambulatory Visit: Payer: Self-pay

## 2022-04-15 DIAGNOSIS — E1169 Type 2 diabetes mellitus with other specified complication: Secondary | ICD-10-CM

## 2022-04-15 DIAGNOSIS — E1165 Type 2 diabetes mellitus with hyperglycemia: Secondary | ICD-10-CM

## 2022-04-15 NOTE — Telephone Encounter (Signed)
Copied from [place] [id number]. Topic: General - Other >> Apr 15, 2022  4:41 PM Rosanne Ashing P wrote: Reason for CRM: Pt called saying his ins is reprocessing his last claim.  It was Dr. Quentin Cornwall that he seen amd the claim had Dr. Rosanna Randy but he had already left at that time.

## 2022-04-16 ENCOUNTER — Telehealth: Payer: Self-pay | Admitting: Family Medicine

## 2022-04-16 ENCOUNTER — Other Ambulatory Visit: Payer: Self-pay | Admitting: Family Medicine

## 2022-04-16 ENCOUNTER — Encounter: Payer: Self-pay | Admitting: Family Medicine

## 2022-04-16 MED ORDER — TRESIBA FLEXTOUCH 100 UNIT/ML ~~LOC~~ SOPN: PEN_INJECTOR | SUBCUTANEOUS | 3 refills | Status: DC

## 2022-04-16 NOTE — Telephone Encounter (Signed)
Medication Refill - Medication: tresiba prescribed for 50 mg, patient wants to change to 30 day supply for insurance coverage  Has the patient contacted their pharmacy? yes (Agent: If no, request that the patient contact the pharmacy for the refill. If patient does not wish to contact the pharmacy document the reason why and proceed with request.) (Agent: If yes, when and what did the pharmacy advise?)contact pcp  Preferred Pharmacy (with phone number or street name):  [id number] [place] [address]  Phone:  [phone]:  [phone] the patient been seen for an appointment in the last year OR does the patient have an upcoming appointment? yes  Agent: Please be advised that RX refills may take up to 3 business days. We ask that you follow-up with your pharmacy.

## 2022-04-21 ENCOUNTER — Other Ambulatory Visit: Payer: Self-pay | Admitting: Family Medicine

## 2022-04-21 ENCOUNTER — Telehealth: Payer: Self-pay | Admitting: Family Medicine

## 2022-04-21 DIAGNOSIS — E1165 Type 2 diabetes mellitus with hyperglycemia: Secondary | ICD-10-CM

## 2022-04-21 DIAGNOSIS — E1169 Type 2 diabetes mellitus with other specified complication: Secondary | ICD-10-CM

## 2022-04-21 NOTE — Telephone Encounter (Signed)
insulin degludec (TRESIBA FLEXTOUCH) 100 UNIT/ML FlexTouch Pen rx was written on 10/27 but never sent to pharmacy. Please resend.

## 2022-04-21 NOTE — Telephone Encounter (Signed)
Medication Refill - Medication: nystatin cream (MYCOSTATIN) [Pharmacy Med Name: NYSTATIN 100,000 UNIT/GM CREAM]  Has the patient contacted their pharmacy? Yes.   (Agent: If no, request that the patient contact the pharmacy for the refill. If patient does not wish to contact the pharmacy document the reason why and proceed with request.) (Agent: If yes, when and what did the pharmacy advise?)  Preferred Pharmacy (with phone number or street name):  [id number] [address] [address] Phone:  [phone] Fax:  [phone]    Has the patient been seen for an appointment in the last year OR does the patient have an upcoming appointment? Yes.    Agent: Please be advised that RX refills may take up to 3 business days. We ask that you follow-up with your pharmacy.

## 2022-04-22 MED ORDER — NYSTATIN 100000 UNIT/GM EX CREA: TOPICAL_CREAM | CUTANEOUS | 6 refills | Status: DC

## 2022-04-22 MED ORDER — TRESIBA FLEXTOUCH 100 UNIT/ML ~~LOC~~ SOPN: PEN_INJECTOR | SUBCUTANEOUS | 3 refills | Status: DC

## 2022-04-22 NOTE — Telephone Encounter (Signed)
Requested medication (s) are due for refill today:   Provider to review  Requested medication (s) are on the active medication list:   Yes from 2022  Future visit scheduled:   Yes  11/15 with Dr. Alba Cory   Last ordered: 822/2022 30 g, 6 refills under Dr. Rosanna Randy  Returned because there isn't a protocol assigned to this medication.      Pt. Is requesting a sample of Ozempic because the pharmacy is out of stock.      Requested Prescriptions  Pending Prescriptions Disp Refills   nystatin cream (MYCOSTATIN) 30 g 6     There is no refill protocol information for this order

## 2022-04-23 NOTE — Telephone Encounter (Signed)
Reason for YEM:VVKPQAE called in asking for samples of Ozempic because Pharmacy is out of stock right now, message was left on 11/01 but no response yet. Please call back

## 2022-04-25 ENCOUNTER — Other Ambulatory Visit: Payer: Self-pay | Admitting: Family Medicine

## 2022-04-25 DIAGNOSIS — N401 Enlarged prostate with lower urinary tract symptoms: Secondary | ICD-10-CM

## 2022-04-26 NOTE — Telephone Encounter (Signed)
Requested medication (s) are due for refill today: yes  Requested medication (s) are on the active medication list: yes  Last refill:  03/31/22 #30 capsule  Future visit scheduled: yes  Notes to clinic:  pharmacy requesting a 90 day refill- pt has upcoming visit   Requested Prescriptions  Pending Prescriptions Disp Refills   terazosin (HYTRIN) 1 MG capsule [Pharmacy Med Name: TERAZOSIN 1 MG CAPSULE] 90 capsule 1    Sig: TAKE 1 CAPSULE BY MOUTH AT BEDTIME.     Cardiovascular:  Alpha Blockers Passed - 04/25/2022  9:31 AM      Passed - Last BP in normal range    BP Readings from Last 1 Encounters:  03/31/22 114/69         Passed - Valid encounter within last 6 months    Recent Outpatient Visits           3 weeks ago Type 2 diabetes mellitus with other specified complication, without long-term current use of insulin ([place])   [facility] Simmons-Robinson, [place], MD   2 months ago [facility] Jerrol Banana., MD   5 months ago Annual physical exam   [facility] Jerrol Banana., MD   6 months ago Acute cough   [place], Dani Gobble, PA-C   9 months ago Type 2 diabetes mellitus with other specified complication, without long-term current use of insulin [facility])   [place], Jake Church, DO       Future Appointments             In 1 week Simmons-Robinson, Riki Sheer, MD [facility], [place]

## 2022-04-28 ENCOUNTER — Other Ambulatory Visit: Payer: Self-pay | Admitting: Family Medicine

## 2022-04-28 DIAGNOSIS — E1169 Type 2 diabetes mellitus with other specified complication: Secondary | ICD-10-CM

## 2022-04-29 MED ORDER — SEMAGLUTIDE (1 MG/DOSE) 4 MG/3ML ~~LOC~~ SOPN: 1.0000 mg | PEN_INJECTOR | SUBCUTANEOUS | 2 refills | Status: DC

## 2022-04-29 NOTE — Addendum Note (Signed)
Addended by: Adolph Pollack on: 04/29/2022 10:31 AM   Modules accepted: Orders

## 2022-04-29 NOTE — Telephone Encounter (Signed)
He can continue the original dose.   [name], MD  [facility]  [phone]

## 2022-04-29 NOTE — Telephone Encounter (Signed)
I can provided printed prescription for Ozempic for patient to pick up and take to  [place] to see if they have supply. Unfortunately, the clinic does not have samples of this medication.   Dose changed from 0.5 to 1.'0mg'$ , given recent refills from 03/2022 visit to assist with insurance coverage   [name], MD  [facility]

## 2022-04-29 NOTE — Telephone Encounter (Signed)
[facility] and he is going to pick up tomorrow. Reports that he has been without the medication for a week. Does he need to start all over with the previous dosage or just continue the current dose that he was suppose to be in?

## 2022-04-29 NOTE — Telephone Encounter (Signed)
LMTCB-Ok for [facility] Nurse to advise 

## 2022-04-30 NOTE — Telephone Encounter (Signed)
Patient advised.

## 2022-05-05 ENCOUNTER — Ambulatory Visit (INDEPENDENT_AMBULATORY_CARE_PROVIDER_SITE_OTHER): Payer: Managed Care, Other (non HMO) | Admitting: Family Medicine

## 2022-05-05 ENCOUNTER — Encounter: Payer: Self-pay | Admitting: Family Medicine

## 2022-05-05 VITALS — BP 118/69 | HR 69 | Temp 97.8°F | Resp 16 | Wt 241.5 lb

## 2022-05-05 DIAGNOSIS — E119 Type 2 diabetes mellitus without complications: Secondary | ICD-10-CM | POA: Diagnosis not present

## 2022-05-05 DIAGNOSIS — Z0000 Encounter for general adult medical examination without abnormal findings: Secondary | ICD-10-CM

## 2022-05-05 DIAGNOSIS — R350 Frequency of micturition: Secondary | ICD-10-CM

## 2022-05-05 DIAGNOSIS — N401 Enlarged prostate with lower urinary tract symptoms: Secondary | ICD-10-CM | POA: Diagnosis not present

## 2022-05-05 DIAGNOSIS — Z23 Encounter for immunization: Secondary | ICD-10-CM

## 2022-05-05 NOTE — Progress Notes (Addendum)
I,Jeffery West,acting as a scribe for [employer], MD.,have documented all relevant documentation on the behalf of Jeffery Foster, MD,as directed by  Jeffery Foster, MD while in the presence of Jeffery Foster, MD.   Established patient visit   Patient: Jeffery West   DOB: 05-Apr-1956   66 y.o. Male  MRN: [id number] Visit Date: 05/05/2022  Today's healthcare provider: [name], MD   Chief Complaint  Patient presents with   Follow-Up chronic Disease   Subjective    HPI  Diabetes Mellitus Type II, Follow-up  Lab Results  Component Value Date   HGBA1C 7.5 (A) 03/31/2022   HGBA1C 8.6 (H) 11/25/2021   HGBA1C 9.4 (A) 07/15/2021   Wt Readings from Last 3 Encounters:  05/05/22 241 lb 8 oz (109.5 kg)  03/31/22 240 lb 3.2 oz (109 kg)  11/25/21 244 lb (110.7 kg)   Last seen for diabetes 1 months ago.  Management since then includes  increase jardiance from 5m to 279mdaily, also takes Ozempic 44m43meekly, triseba 25 units daily and metformin 1000 mg BID. He reports excellent compliance with treatment. He is not having side effects.   Symptoms: Yes fatigue No foot ulcerations  No appetite changes No nausea  No paresthesia of the feet  No polydipsia  No polyuria No visual disturbances   No vomiting     Home blood sugar records:  not being checked Current insulin regiment: Tresiba  Pertinent Labs: Lab Results  Component Value Date   CHOL 134 07/15/2021   HDL 33 (L) 07/15/2021   LDLCALC 60 07/15/2021   TRIG 254 (H) 07/15/2021   CHOLHDL 4.1 07/15/2021   Lab Results  Component Value Date   NA 139 11/25/2021   K 5.0 11/25/2021   CREATININE 0.96 11/25/2021   EGFR 88 11/25/2021   MICROALBUR 50 02/26/2020   LABMICR <3.0 03/31/2022     ---------------------------------------------------------------------------------------------------  Follow up for benign prostatic hyperplasia with urinary frequency  The  patient was last seen for this 1 months ago. Changes made at last visit include add terazosin 44mg29m bedtime to help with urinary frequency. If no improvement will discuss referral to Urology.  He reports poor compliance with treatment. He feels that condition is Unchanged. Reports that he read side effect and decided not to take it. -----------------------------------------------------------------------------------------  Medications: Outpatient Medications Prior to Visit  Medication Sig   acetaminophen (TYLENOL) 500 MG tablet Take 1,000 mg by mouth every 6 (six) hours as needed for moderate pain or headache.   albuterol (VENTOLIN HFA) 108 (90 Base) MCG/ACT inhaler INHALE 1 PUFF INTO THE LUNGS EVERY 6 HOURS AS NEEDED FOR WHEEZING OR SHORTNESS OF BREATH.   allopurinol (ZYLOPRIM) 300 MG tablet TAKE 1 TABLET BY MOUTH EVERY DAY   aspirin EC 81 MG tablet Take 81 mg by mouth daily. Swallow whole.   BD PEN NEEDLE NANO 2ND GEN 32G X 4 MM MISC USE AS DIRECTED   Cholecalciferol (VITAMIN D) 2000 units CAPS Take 2,000 Units by mouth every evening.    empagliflozin (JARDIANCE) 25 MG TABS tablet Take 1 tablet (25 mg total) by mouth daily before breakfast.   ezetimibe (ZETIA) 10 MG tablet TAKE 1 TABLET BY MOUTH EVERY DAY   glucose blood (ONETOUCH VERIO) test strip Use as instructed; BID   insulin degludec (TRESIBA FLEXTOUCH) 100 UNIT/ML FlexTouch Pen INJECT 25 UNITS INTO THE SKIN DAILY AT 10 PM.   losartan (COZAAR) 50 MG tablet TAKE 1 TABLET BY MOUTH EVERY DAY  magnesium oxide (MAG-OX) 400 (240 Mg) MG tablet TAKE 1 TABLET BY MOUTH TWICE A DAY   magnesium oxide (MAG-OX) 400 MG tablet Take 400 mg by mouth 2 (two) times daily.    metFORMIN (GLUCOPHAGE) 1000 MG tablet TAKE 1 TABLET (1,000 MG TOTAL) BY MOUTH TWICE A DAY WITH FOOD   metoprolol tartrate (LOPRESSOR) 25 MG tablet Take 1 tablet (25 mg total) by mouth 2 (two) times daily.   nystatin cream (MYCOSTATIN) APPLY TO AFFECTED AREA TWICE A DAY   OneTouch  Delica Lancets 87G MISC Use 3 times a day   rosuvastatin (CRESTOR) 5 MG tablet Take 1 tablet (5 mg total) by mouth daily.   Semaglutide, 1 MG/DOSE, 4 MG/3ML SOPN Inject 1 mg as directed once a week.   sildenafil (REVATIO) 20 MG tablet Take 1 tablet (20 mg total) by mouth 3 (three) times daily as needed.   traMADol (ULTRAM) 50 MG tablet Take 50 mg by mouth every 6 (six) hours as needed.   icosapent Ethyl (VASCEPA) 1 g capsule Take 2 capsules (2 g total) by mouth 2 (two) times daily.   terazosin (HYTRIN) 1 MG capsule TAKE 1 CAPSULE BY MOUTH AT BEDTIME. (Patient not taking: Reported on 05/05/2022)   No facility-administered medications prior to visit.    Review of Systems     Objective    BP 118/69 (BP Location: Left Arm, Patient Position: Sitting, Cuff Size: Large)   Pulse 69   Temp 97.8 F (36.6 C) (Oral)   Resp 16   Wt 241 lb 8 oz (109.5 kg)   BMI 34.65 kg/m    Physical Exam Vitals reviewed.  Constitutional:      General: He is not in acute distress.    Appearance: Normal appearance. He is not ill-appearing, toxic-appearing or diaphoretic.  Eyes:     Conjunctiva/sclera: Conjunctivae normal.  Cardiovascular:     Rate and Rhythm: Normal rate and regular rhythm.     Pulses: Normal pulses.     Heart sounds: Normal heart sounds. No murmur heard.    No friction rub. No gallop.  Pulmonary:     Effort: Pulmonary effort is normal. No respiratory distress.     Breath sounds: Normal breath sounds. No stridor. No wheezing, rhonchi or rales.  Abdominal:     General: Bowel sounds are normal. There is no distension.     Palpations: Abdomen is soft.     Tenderness: There is no abdominal tenderness.  Musculoskeletal:     Right lower leg: No edema.     Left lower leg: No edema.  Skin:    Findings: No erythema or rash.  Neurological:     Mental Status: He is alert and oriented to person, place, and time.       No results found for any visits on 05/05/22.  Assessment & Plan      Problem List Items Addressed This Visit       Endocrine   Diabetes mellitus ([place]) - Primary    Stable  UTD on A1c,foot exam and eye exam  He will continue current medications  Follow up in 6 months           Other   Benign prostatic hyperplasia with urinary frequency    Persistent symptoms  Patient reports these are managable and prefers to not take prescribed medications  He declines offer for urology referral at this time  He requests to keep terazosin on his medication list in the event that he would feel  more comfortable trying it in the future if symptoms persist        Healthcare maintenance    Patient given Pneumo vaccine today       Need for pneumococcal 20-valent conjugate vaccination    PNA vaccine administered today  Patient tolerated vaccine well       Relevant Orders   Pneumococcal conjugate vaccine 20-valent (Prevnar 20) (Completed)     Return in about 6 months (around 11/03/2022).      I, Jeffery Foster, MD, have reviewed all documentation for this visit. The documentation on 05/08/22 for the exam, diagnosis, procedures, and orders are all accurate and complete.  Portions of this information were initially documented by the CMA and reviewed by me for thoroughness and accuracy.      Jeffery Foster, MD  [facility] [phone] (phone) [phone] (fax)  [place]

## 2022-05-06 ENCOUNTER — Ambulatory Visit: Payer: Managed Care, Other (non HMO) | Admitting: Family Medicine

## 2022-05-08 DIAGNOSIS — Z0000 Encounter for general adult medical examination without abnormal findings: Secondary | ICD-10-CM | POA: Insufficient documentation

## 2022-05-08 DIAGNOSIS — Z23 Encounter for immunization: Secondary | ICD-10-CM | POA: Insufficient documentation

## 2022-05-08 NOTE — Assessment & Plan Note (Signed)
Patient given Pneumo vaccine today

## 2022-05-08 NOTE — Assessment & Plan Note (Signed)
PNA vaccine administered today  Patient tolerated vaccine well

## 2022-05-08 NOTE — Assessment & Plan Note (Signed)
Stable  UTD on A1c,foot exam and eye exam  He will continue current medications  Follow up in 6 months

## 2022-05-08 NOTE — Assessment & Plan Note (Addendum)
Persistent symptoms  Patient reports these are managable and prefers to not take prescribed medications  He declines offer for urology referral at this time  He requests to keep terazosin on his medication list in the event that he would feel more comfortable trying it in the future if symptoms persist

## 2022-05-20 ENCOUNTER — Other Ambulatory Visit: Payer: Self-pay | Admitting: Family Medicine

## 2022-05-20 DIAGNOSIS — E119 Type 2 diabetes mellitus without complications: Secondary | ICD-10-CM

## 2022-05-20 MED ORDER — METFORMIN HCL 1000 MG PO TABS: ORAL_TABLET | ORAL | 0 refills | Status: DC

## 2022-05-20 NOTE — Telephone Encounter (Signed)
Requested Prescriptions  Pending Prescriptions Disp Refills   metFORMIN (GLUCOPHAGE) 1000 MG tablet 180 tablet 0    Sig: TAKE 1 TABLET (1,000 MG TOTAL) BY MOUTH TWICE A DAY WITH FOOD     Endocrinology:  Diabetes - Biguanides Failed - 05/20/2022 11:24 AM      Failed - B12 Level in normal range and within 720 days    No results found for: "VITAMINB12"       Passed - Cr in normal range and within 360 days    Creat  Date Value Ref Range Status  05/23/2017 0.77 0.70 - 1.25 mg/dL Final    Comment:    For patients >74 years of age, the reference limit for Creatinine is approximately 13% higher for people identified as African-American. .    Creatinine, Ser  Date Value Ref Range Status  11/25/2021 0.96 0.76 - 1.27 mg/dL Final         Passed - HBA1C is between 0 and 7.9 and within 180 days    Hemoglobin A1C  Date Value Ref Range Status  03/31/2022 7.5 (A) 4.0 - 5.6 % Final   Hgb A1c MFr Bld  Date Value Ref Range Status  11/25/2021 8.6 (H) 4.8 - 5.6 % Final    Comment:             Prediabetes: 5.7 - 6.4          Diabetes: >6.4          Glycemic control for adults with diabetes: <7.0          Passed - eGFR in normal range and within 360 days    GFR, Est African American  Date Value Ref Range Status  05/23/2017 114 > OR = 60 mL/min/1.72m Final   GFR calc Af Amer  Date Value Ref Range Status  12/18/2019 96 >59 mL/min/1.73 Final    Comment:    **[facility] currently reports eGFR in compliance with the current**   recommendations of the [employer] [facility] will   update reporting as new guidelines are published from the NKF-ASN   Task force.    GFR, Est Non African American  Date Value Ref Range Status  05/23/2017 99 > OR = 60 mL/min/1.761mFinal   GFR calc non Af Amer  Date Value Ref Range Status  12/18/2019 83 >59 mL/min/1.73 Final   eGFR  Date Value Ref Range Status  11/25/2021 88 >59 mL/min/1.73 Final         Passed - Valid encounter within last  6 months    Recent Outpatient Visits           2 weeks ago Type 2 diabetes mellitus without complication, without long-term current use of insulin ([facility]  Bu[place] VistaMaFinancial risk analystMD   1 month ago Type 2 diabetes mellitus with other specified complication, without long-term current use of insulin ([place]  BuLabetteMaFair LawnMD   3 months ago COHighland AcresiRosanna RandyRiRetia Passe MD   5 months ago Annual physical exam   [facility] Banana [facility]   7 months ago Acute cough   BuCorsicanaPA-C       Future Appointments             In 3 months Simmons-Robinson, MaRiki SheerMD BuNewell RubbermaidPELamar Heights CBC within normal  limits and completed in the last 12 months    WBC  Date Value Ref Range Status  11/25/2021 7.5 3.4 - 10.8 x10E3/uL Final  11/28/2019 12.7 (H) 4.0 - 10.5 K/uL Final   RBC  Date Value Ref Range Status  11/25/2021 5.59 4.14 - 5.80 x10E6/uL Final  11/28/2019 3.96 (L) 4.22 - 5.81 MIL/uL Final   Hemoglobin  Date Value Ref Range Status  11/25/2021 16.3 13.0 - 17.7 g/dL Final   Hematocrit  Date Value Ref Range Status  11/25/2021 48.8 37.5 - 51.0 % Final   MCHC  Date Value Ref Range Status  11/25/2021 33.4 31.5 - 35.7 g/dL Final  11/28/2019 32.6 30.0 - 36.0 g/dL Final   [facility]  Date Value Ref Range Status  11/25/2021 29.2 26.6 - 33.0 pg Final  11/28/2019 30.8 26.0 - 34.0 pg Final   MCV  Date Value Ref Range Status  11/25/2021 87 79 - 97 fL Final   No results found for: "PLTCOUNTKUC", "LABPLAT", "POCPLA" RDW  Date Value Ref Range Status  11/25/2021 14.5 11.6 - 15.4 % Final

## 2022-05-20 NOTE — Telephone Encounter (Signed)
Medication Refill - Medication: metFORMIN (GLUCOPHAGE) 1000 MG tablet   Has the patient contacted their pharmacy? yes (Agent: If no, request that the patient contact the pharmacy for the refill. If patient does not wish to contact the pharmacy document the reason why and proceed with request.) (Agent: If yes, when and what did the pharmacy advise?)contact pcp  Preferred Pharmacy (with phone number or street name):  [id number] [place] [address] Phone: [phone] Fax: [phone]    Has the patient been seen for an appointment in the last year OR does the patient have an upcoming appointment? yes  Agent: Please be advised that RX refills may take up to 3 business days. We ask that you follow-up with your pharmacy.

## 2022-06-10 ENCOUNTER — Telehealth: Payer: Self-pay | Admitting: Family Medicine

## 2022-06-10 DIAGNOSIS — E119 Type 2 diabetes mellitus without complications: Secondary | ICD-10-CM

## 2022-06-10 MED ORDER — SEMAGLUTIDE (1 MG/DOSE) 4 MG/3ML ~~LOC~~ SOPN: 1.0000 mg | PEN_INJECTOR | SUBCUTANEOUS | 3 refills | Status: DC

## 2022-06-10 NOTE — Telephone Encounter (Signed)
Pt received notice that his ozempic will no longer be apart of assistance program and the cost will increase / pt would like a refill for a 90 day supply before the program runs out next week or end of year/ and then in March discuss med change / please advise   Semaglutide, 1 MG/DOSE, 4 MG/3ML SOPN [[id number]]   [id number] [place] [place] [facility] [address]

## 2022-06-10 NOTE — Telephone Encounter (Signed)
Done

## 2022-06-17 NOTE — Telephone Encounter (Signed)
Pt is calling to request Semaglutide,0.25 or 0.'5MG'$ /DOS, (OZEMPIC, 0.25 OR 0.5 MG/DOSE,) 2 MG/1.5ML SOPN [[id number]]  ENDED at a 90 day dosage. [facility] [address]. Pt would like this to be completed prior to the end of the year.

## 2022-06-18 MED ORDER — SEMAGLUTIDE (1 MG/DOSE) 4 MG/3ML ~~LOC~~ SOPN: 1.0000 mg | PEN_INJECTOR | SUBCUTANEOUS | 1 refills | Status: DC

## 2022-06-18 NOTE — Addendum Note (Signed)
Addended by: Adolph Pollack on: 06/18/2022 01:06 PM   Modules accepted: Orders

## 2022-06-18 NOTE — Telephone Encounter (Signed)
Patient is wanting to know why his Ozempic changed to Semaglutide. Patient was wanting a 90 day supply of Ozempic before the end of the year because he will no longer receive the discount and once the 90 days is over he has his appointment with PCP in March then it can be discussed to change the medication. Patient is wanting clarification and states that the dosage amount from Ozempic to [facility] is different.  Please advise.

## 2022-06-18 NOTE — Telephone Encounter (Signed)
Pt called saying he only has until the 12/31 to get this medication.  He said he is trying to get a 90 day supply before the end of the year.  He needs a call to get this straightened out today.  CB@  [phone]

## 2022-06-18 NOTE — Telephone Encounter (Signed)
Last office note states that patient will take '1mg'$  per week of ozempic (brand name for semaglutide)   Will send prescription for 90 day supply prior to 06/20/22  Jeffery Foster, MD  [facility]

## 2022-06-23 ENCOUNTER — Other Ambulatory Visit: Payer: Self-pay | Admitting: Family Medicine

## 2022-06-23 ENCOUNTER — Telehealth: Payer: Self-pay | Admitting: Family Medicine

## 2022-06-23 DIAGNOSIS — E1169 Type 2 diabetes mellitus with other specified complication: Secondary | ICD-10-CM

## 2022-06-23 DIAGNOSIS — E1165 Type 2 diabetes mellitus with hyperglycemia: Secondary | ICD-10-CM

## 2022-06-24 NOTE — Telephone Encounter (Signed)
Rx was last written for 25 units daily in Nov of 2023 with three refills [facility] [place].  Has pharmacy not received recent prescription?   [name], MD  [facility]

## 2022-06-24 NOTE — Telephone Encounter (Signed)
Pt states he has been taking all year and has one pen left (12 injections).   He is unaware why the medication was discontinued last year.

## 2022-06-24 NOTE — Telephone Encounter (Signed)
Dc'd 07/15/21 Dr Ky Barban  Requested Prescriptions  Refused Prescriptions Disp Refills   insulin degludec (TRESIBA FLEXTOUCH) 100 UNIT/ML FlexTouch Pen [Pharmacy Med Name: TRESIBA FLEXTOUCH 100 UNIT/ML]  4    Sig: INJECT 30 UNITS INTO THE SKIN DAILY AT 10 PM.     Endocrinology:  Diabetes - Insulins Passed - 06/23/2022  7:06 PM      Passed - HBA1C is between 0 and 7.9 and within 180 days    Hemoglobin A1C  Date Value Ref Range Status  03/31/2022 7.5 (A) 4.0 - 5.6 % Final   Hgb A1c MFr Bld  Date Value Ref Range Status  11/25/2021 8.6 (H) 4.8 - 5.6 % Final    Comment:             Prediabetes: 5.7 - 6.4          Diabetes: >6.4          Glycemic control for adults with diabetes: <7.0          Passed - Valid encounter within last 6 months    Recent Outpatient Visits           1 month ago Type 2 diabetes mellitus without complication, without long-term current use of insulin ([place])   [place] Simmons-Robinson, Financial risk analyst, MD   2 months ago Type 2 diabetes mellitus with other specified complication, without long-term current use of insulin ([place])   [facility] Simmons-Robinson, [place], MD   4 months ago [place] Rosanna Randy, Retia Passe., MD   7 months ago Annual physical exam   [facility] Jerrol Banana., MD   8 months ago Acute cough   [place], PA-C       Future Appointments             In 2 months Simmons-Robinson, Riki Sheer, MD [employer], [place]

## 2022-06-25 ENCOUNTER — Telehealth: Payer: Self-pay

## 2022-06-25 ENCOUNTER — Other Ambulatory Visit: Payer: Self-pay | Admitting: Family Medicine

## 2022-06-25 DIAGNOSIS — E1165 Type 2 diabetes mellitus with hyperglycemia: Secondary | ICD-10-CM

## 2022-06-25 DIAGNOSIS — E1169 Type 2 diabetes mellitus with other specified complication: Secondary | ICD-10-CM

## 2022-06-25 NOTE — Telephone Encounter (Signed)
Pt called regarding 2 medications Tresiba and Ozempic.  Tyler Aas was refilled on 04/22/2022 76m with 3 refills. Dose ordered was 30units.  Pt went to pick up [place] noticed that dosage was 30 units and mentioned to pharmacist that his dosage is supposed to be 25 units.  Since the rx and  pt did not agree, the medication refill was set aside for clarification.   Per pt he was on 30 units, but Dr. GRosanna Randysaw that his glucose numbers had improved and moved him down to 25 units. Please verify number of units pt should be on and send  updated information to pharmacy.   Pt was trying to get a 3 months supply of Ozempic before the end of the year,when pt's cost would increase. Provider sent in Rx for 3 months for pt, however the dose prescribed  was 1.0 mg. Pt had been taking 0.'5mg'$ .Pt does not recall anything about an increase in dosage.  Because this was not the dose that the insurance company had approved to pay for pt was unable this  get this medication.  Please verify dosage of Ozempic for pt and call in 3 month supply if indicated.    ([employer] Healthhas stated that they will allow him the 90 day supply at the previous lower cost to pt.)  Please review dosages for pt. Pt would like a call back regarding dosages, and an update of when the medications will be called in.

## 2022-06-28 NOTE — Telephone Encounter (Signed)
Requested medications are due for refill today.  Please see conversation notes  Requested medications are on the active medications list.    Last refill.   Future visit scheduled.   yes  Notes to clinic.      Requested Prescriptions  Pending Prescriptions Disp Refills   TRESIBA FLEXTOUCH 100 UNIT/ML FlexTouch Pen [Pharmacy Med Name: TRESIBA FLEXTOUCH 100 UNIT/ML]  4    Sig: INJECT 30 UNITS INTO THE SKIN DAILY AT 10 PM.     Endocrinology:  Diabetes - Insulins Passed - 06/25/2022  5:16 PM      Passed - HBA1C is between 0 and 7.9 and within 180 days    Hemoglobin A1C  Date Value Ref Range Status  03/31/2022 7.5 (A) 4.0 - 5.6 % Final   Hgb A1c MFr Bld  Date Value Ref Range Status  11/25/2021 8.6 (H) 4.8 - 5.6 % Final    Comment:             Prediabetes: 5.7 - 6.4          Diabetes: >6.4          Glycemic control for adults with diabetes: <7.0          Passed - Valid encounter within last 6 months    Recent Outpatient Visits           1 month ago Type 2 diabetes mellitus without complication, without long-term current use of insulin ([place])   [place] Simmons-Robinson, Financial risk analyst, MD   2 months ago Type 2 diabetes mellitus with other specified complication, without long-term current use of insulin ([place])   [facility] Simmons-Robinson, [place], MD   4 months ago [place] Rosanna Randy, Retia Passe., MD   7 months ago Annual physical exam   [facility] Jerrol Banana., MD   8 months ago Acute cough   [place], PA-C       Future Appointments             In 1 month Simmons-Robinson, Riki Sheer, MD [employer], [place]

## 2022-06-29 ENCOUNTER — Telehealth: Payer: Self-pay | Admitting: Family Medicine

## 2022-06-29 NOTE — Telephone Encounter (Signed)
The patient called in very frustrated as to why the pharmacy has told him they have the Semaglutide, 1 MG/DOSE, 4 MG/3ML SOPN that was called in but not filled because the insurance will not cover a 90 day on the 1 mg and not his request 0.5 dose he has been getting. He states his provider never said anything about an increase in dosage and his insurance told him they would cover a 90 day supply of the 0.5. He states the pharmacy has not gotten a response back from his providers office. [facility]    [id number] [place] [address] Phone: [phone] Fax: [phone]    Please assist patient further

## 2022-06-30 ENCOUNTER — Other Ambulatory Visit: Payer: Self-pay | Admitting: Family Medicine

## 2022-06-30 MED ORDER — OZEMPIC (0.25 OR 0.5 MG/DOSE) 2 MG/3ML ~~LOC~~ SOPN: 0.5000 mg | PEN_INJECTOR | SUBCUTANEOUS | 1 refills | Status: DC

## 2022-06-30 NOTE — Telephone Encounter (Signed)
    Rx for semaglutide 0.'5mg'$  weekly sent to patient's pharmacy. 51m supply ordered with 1 refill    Previously requested refill soon after refill had been submitted so increased dose was called in to assist with coverage (1.'0mg'$  weekly instead of 0.'5mg'$  weekly) In nov 23  Patient was previously having difficulty with obtaining supply of medication in Oct-Nov 2023 due to shortage      MEulis Foster MD  [facility]

## 2022-06-30 NOTE — Progress Notes (Signed)
Rx for semaglutide 0.'5mg'$  weekly sent to patient's pharmacy. 85m supply ordered with 1 refill   Previously requested refill soon after refill had been submitted so increased dose was called in to assist with coverage (1.'0mg'$  weekly instead of 0.'5mg'$  weekly) In nov 23  Patient was previously having difficulty with obtaining supply of medication in Oct-Nov 2023 due to shortage

## 2022-07-02 NOTE — Telephone Encounter (Signed)
Forwarded to office.

## 2022-07-02 NOTE — Telephone Encounter (Signed)
[facility] the Tresiba 01/08. Per patient insurance through cover my meds Jeffery West (Key: [id number]) Rx #: [id number] Need Help? Call us at [phone] Outcome Additional Information Required Drug is covered by current benefit plan. No further PA activity needed Drug Insulin Degludec FlexTouch 100UNIT/ML pen-injectors ePA cloud Secretary/administrator PA Form [phone] NCPDP) Original Claim Info [phone] Drug not covered pa required (Adv)

## 2022-07-15 ENCOUNTER — Other Ambulatory Visit: Payer: Self-pay | Admitting: Family Medicine

## 2022-07-15 DIAGNOSIS — E785 Hyperlipidemia, unspecified: Secondary | ICD-10-CM

## 2022-07-15 DIAGNOSIS — I10 Essential (primary) hypertension: Secondary | ICD-10-CM

## 2022-07-15 NOTE — Telephone Encounter (Signed)
Medication Refill - Medication: losartan (COZAAR) 50 MG tablet   ezetimibe (ZETIA) 10 MG tablet   Pt says he requested these two medications over a week ago   Has the patient contacted their pharmacy? Yes.   (Agent: If no, request that the patient contact the pharmacy for the refill. If patient does not wish to contact the pharmacy document the reason why and proceed with request.) (Agent: If yes, when and what did the pharmacy advise?)  Preferred Pharmacy (with phone number or street name):  [id number] [place] [address] Phone: [phone]: [phone]  Has the patient been seen for an appointment in the last year OR does the patient have an upcoming appointment? Yes.    Agent: Please be advised that RX refills may take up to 3 business days. We ask that you follow-up with your pharmacy.

## 2022-07-16 ENCOUNTER — Other Ambulatory Visit: Payer: Self-pay | Admitting: Family Medicine

## 2022-07-16 DIAGNOSIS — E785 Hyperlipidemia, unspecified: Secondary | ICD-10-CM

## 2022-07-16 DIAGNOSIS — I10 Essential (primary) hypertension: Secondary | ICD-10-CM

## 2022-07-16 NOTE — Telephone Encounter (Signed)
Requested medication (s) are due for refill today: yes   Requested medication (s) are on the active medication list: yes  Last refill:  losartan: 12/25/21    ezetimibe: 09/29/21 #90 2 RF  Future visit scheduled: yes  Notes to clinic:  overdue lab work   Requested Prescriptions  Pending Prescriptions Disp Refills   losartan (COZAAR) 50 MG tablet 90 tablet 1    Sig: Take 1 tablet (50 mg total) by mouth daily.     Cardiovascular:  Angiotensin Receptor Blockers Failed - 07/15/2022  5:15 PM      Failed - Cr in normal range and within 180 days    Creat  Date Value Ref Range Status  05/23/2017 0.77 0.70 - 1.25 mg/dL Final    Comment:    For patients >62 years of age, the reference limit for Creatinine is approximately 13% higher for people identified as African-American. .    Creatinine, Ser  Date Value Ref Range Status  11/25/2021 0.96 0.76 - 1.27 mg/dL Final         Failed - K in normal range and within 180 days    Potassium  Date Value Ref Range Status  11/25/2021 5.0 3.5 - 5.2 mmol/L Final         Passed - Patient is not pregnant      Passed - Last BP in normal range    BP Readings from Last 1 Encounters:  05/05/22 118/69         Passed - Valid encounter within last 6 months    Recent Outpatient Visits           2 months ago Type 2 diabetes mellitus without complication, without long-term current use of insulin ([place])   [place] Simmons-Robinson, [place], MD   3 months ago Type 2 diabetes mellitus with other specified complication, without long-term current use of insulin ([place])   [place] Simmons-Robinson, Hurl[place]urt [place], MD   5 months ago [place] Jerrol Banana., MD   7 months ago Annual physical exam   [facility] Jerrol Banana., MD   9 months ago Acute cough   [place], Dani Gobble, PA-C        Future Appointments             In 1 month Simmons-Robinson, [place], MD [facility], [facility]             ezetimibe (ZETIA) 10 MG tablet 90 tablet 2    Sig: Take 1 tablet (10 mg total) by mouth daily.     Cardiovascular:  Antilipid - Sterol Transport Inhibitors Failed - 07/15/2022  5:15 PM      Failed - Lipid Panel in normal range within the last 12 months    Cholesterol, Total  Date Value Ref Range Status  07/15/2021 134 100 - 199 mg/dL Final   LDL Cholesterol (Calc)  Date Value Ref Range Status  05/23/2017  mg/dL (calc) Final    Comment:    . LDL cholesterol not calculated. Triglyceride levels greater than 400 mg/dL invalidate calculated LDL results. . Reference range: <100 . Desirable range <100 mg/dL for primary prevention;   <70 mg/dL for patients with CHD or diabetic patients  with > or = 2 CHD risk factors. Marland Kitchen LDL-C is now calculated using the Martin-Hopkins  calculation, which is a validated novel method providing  better accuracy than the Friedewald equation in the  estimation of LDL-C.  Cresenciano Genre et al. Annamaria Helling. 1194;174(08): 2061-2068  (http://education.Quest[employer].com/faq/FAQ164)    LDL Chol Calc (NIH)  Date Value Ref Range Status  07/15/2021 60 0 - 99 mg/dL Final   HDL  Date Value Ref Range Status  07/15/2021 33 (L) >39 mg/dL Final   Triglycerides  Date Value Ref Range Status  07/15/2021 254 (H) 0 - 149 mg/dL Final         Passed - AST in normal range and within 360 days    AST  Date Value Ref Range Status  11/25/2021 17 0 - 40 IU/L Final         Passed - ALT in normal range and within 360 days    ALT  Date Value Ref Range Status  11/25/2021 19 0 - 44 IU/L Final         Passed - Patient is not pregnant      Passed - Valid encounter within last 12 months    Recent Outpatient Visits           2 months ago Type 2 diabetes mellitus without complication, without long-term current use of insulin ([place])   [place] Simmons-Robinson, [place], MD   3 months ago Type 2 diabetes mellitus with other specified complication, without long-term current use of insulin ([place])   [place] Simmons-Robinson, [place], MD   5 months ago [place] Jerrol Banana., MD   7 months ago Annual physical exam   [facility] Jerrol Banana., MD   9 months ago Acute cough   [place], PA-C       Future Appointments             In 1 month Simmons-Robinson, Riki Sheer, MD [facility], [place]

## 2022-07-23 ENCOUNTER — Other Ambulatory Visit: Payer: Self-pay | Admitting: Family Medicine

## 2022-08-11 ENCOUNTER — Other Ambulatory Visit: Payer: Self-pay | Admitting: Family Medicine

## 2022-08-11 DIAGNOSIS — E119 Type 2 diabetes mellitus without complications: Secondary | ICD-10-CM

## 2022-08-11 NOTE — Telephone Encounter (Signed)
Requested Prescriptions  Pending Prescriptions Disp Refills   metFORMIN (GLUCOPHAGE) 1000 MG tablet [Pharmacy Med Name: METFORMIN HCL 1,000 MG TABLET] 180 tablet 0    Sig: TAKE 1 TABLET (1,000 MG TOTAL) BY MOUTH TWICE A DAY WITH FOOD     Endocrinology:  Diabetes - Biguanides Failed - 08/11/2022  4:14 PM      Failed - B12 Level in normal range and within 720 days    No results found for: "VITAMINB12"       Passed - Cr in normal range and within 360 days    Creat  Date Value Ref Range Status  05/23/2017 0.77 0.70 - 1.25 mg/dL Final    Comment:    For patients >81 years of age, the reference limit for Creatinine is approximately 13% higher for people identified as African-American. .    Creatinine, Ser  Date Value Ref Range Status  11/25/2021 0.96 0.76 - 1.27 mg/dL Final         Passed - HBA1C is between 0 and 7.9 and within 180 days    Hemoglobin A1C  Date Value Ref Range Status  03/31/2022 7.5 (A) 4.0 - 5.6 % Final   Hgb A1c MFr Bld  Date Value Ref Range Status  11/25/2021 8.6 (H) 4.8 - 5.6 % Final    Comment:             Prediabetes: 5.7 - 6.4          Diabetes: >6.4          Glycemic control for adults with diabetes: <7.0          Passed - eGFR in normal range and within 360 days    GFR, Est African American  Date Value Ref Range Status  05/23/2017 114 > OR = 60 mL/min/1.70m Final   GFR calc Af Amer  Date Value Ref Range Status  12/18/2019 96 >59 mL/min/1.73 Final    Comment:    **[facility] currently reports eGFR in compliance with the current**   recommendations of the [employer] [facility] will   update reporting as new guidelines are published from the NKF-ASN   Task force.    GFR, Est Non African American  Date Value Ref Range Status  05/23/2017 99 > OR = 60 mL/min/1.730mFinal   GFR calc non Af Amer  Date Value Ref Range Status  12/18/2019 83 >59 mL/min/1.73 Final   eGFR  Date Value Ref Range Status  11/25/2021 88 >59 mL/min/1.73  Final         Passed - Valid encounter within last 6 months    Recent Outpatient Visits           3 months ago Type 2 diabetes mellitus without complication, without long-term current use of insulin ([place]  [place]mons-Robinson, MaNorth Se[place]on[place]MD   4 months ago Type 2 diabetes mellitus with other specified complication, without long-term current use of insulin ([place]  CoHickmanimmons-Robinson, MaLake BronsonMD   6 months ago COEmpireiEulas PostMD   8 months ago Annual physical exam   CoBo[facility] Clin[facility]ulas PostMD   10 months ago Acute cough   [place]-C       Future Appointments             In 1 week Simmons-Robinson, MaRiki SheerMD [facility]            Passed - CBC within normal limits and completed in the last 12 months    WBC  Date Value Ref Range Status  11/25/2021 7.5 3.4 - 10.8 x10E3/uL Final  11/28/2019 12.7 (H) 4.0 - 10.5 K/uL Final   RBC  Date Value Ref Range Status  11/25/2021 5.59 4.14 - 5.80 x10E6/uL Final  11/28/2019 3.96 (L) 4.22 - 5.81 MIL/uL Final   Hemoglobin  Date Value Ref Range Status  11/25/2021 16.3 13.0 - 17.7 g/dL Final   Hematocrit  Date Value Ref Range Status  11/25/2021 48.8 37.5 - 51.0 % Final   MCHC  Date Value Ref Range Status  11/25/2021 33.4 31.5 - 35.7 g/dL Final  11/28/2019 32.6 30.0 - 36.0 g/dL Final   [facility]  Date Value Ref Range Status  11/25/2021 29.2 26.6 - 33.0 pg Final  11/28/2019 30.8 26.0 - 34.0 pg Final   MCV  Date Value Ref Range Status  11/25/2021 87 79 - 97 fL Final   No results found for: "PLTCOUNTKUC", "LABPLAT", "POCPLA" RDW  Date Value Ref Range Status  11/25/2021 14.5 11.6 - 15.4 % Final

## 2022-08-20 NOTE — Progress Notes (Unsigned)
I,Jeffery West,acting as a scribe for [employer], MD.,have documented all relevant documentation on the behalf of Jeffery Foster, MD,as directed by  Jeffery Foster, MD while in the presence of Jeffery Foster, MD.   Established patient visit   Patient: Jeffery West   DOB: Oct 30, 1955   67 y.o. Male  MRN: [id number] Visit Date: 08/23/2022  Today's healthcare provider: [name], MD   Chief Complaint  Patient presents with   DM follow-up   Subjective    HPI  Diabetes Mellitus Type II, Follow-up  Lab Results  Component Value Date   HGBA1C 8.9 (A) 08/23/2022   HGBA1C 7.5 (A) 03/31/2022   HGBA1C 8.6 (H) 11/25/2021   Wt Readings from Last 3 Encounters:  08/23/22 241 lb 1.6 oz (109.4 kg)  05/05/22 241 lb 8 oz (109.5 kg)  03/31/22 240 lb 3.2 oz (109 kg)   Last seen for diabetes 3 months ago.  Management since then includes none.continue current medications He reports excellent compliance with treatment. Patient reports that Ozempic is going to increase in prize he is wondering if he can be place in a different medication. Patient would prefer to update prescriptions for 30 day supply of tresiba   Symptoms: No fatigue No foot ulcerations  No appetite changes No nausea  No paresthesia of the feet  No polydipsia  No polyuria No visual disturbances   No vomiting     Home blood sugar records:  not being checked.Reports that 3 weeks ago it was 150  Episodes of hypoglycemia? No    Current insulin regiment: Jeffery West injects 30 units    Pertinent Labs: Lab Results  Component Value Date   CHOL 134 07/15/2021   HDL 33 (L) 07/15/2021   LDLCALC 60 07/15/2021   TRIG 254 (H) 07/15/2021   CHOLHDL 4.1 07/15/2021   Lab Results  Component Value Date   NA 139 11/25/2021   K 5.0 11/25/2021   CREATININE 0.96 11/25/2021   EGFR 88 11/25/2021   LABMICR <3.0 03/31/2022   MICRALBCREAT <6 03/31/2022      ---------------------------------------------------------------------------------------------------   Medications: Outpatient Medications Prior to Visit  Medication Sig   acetaminophen (TYLENOL) 500 MG tablet Take 1,000 mg by mouth every 6 (six) hours as needed for moderate pain or headache.   albuterol (VENTOLIN HFA) 108 (90 Base) MCG/ACT inhaler INHALE 1 PUFF INTO THE LUNGS EVERY 6 HOURS AS NEEDED FOR WHEEZING OR SHORTNESS OF BREATH.   allopurinol (ZYLOPRIM) 300 MG tablet TAKE 1 TABLET BY MOUTH EVERY DAY   aspirin EC 81 MG tablet Take 81 mg by mouth daily. Swallow whole.   BD PEN NEEDLE NANO 2ND GEN 32G X 4 MM MISC USE AS DIRECTED   Cholecalciferol (VITAMIN D) 2000 units CAPS Take 2,000 Units by mouth every evening.    empagliflozin (JARDIANCE) 25 MG TABS tablet Take 1 tablet (25 mg total) by mouth daily before breakfast.   ezetimibe (ZETIA) 10 MG tablet TAKE 1 TABLET BY MOUTH EVERY DAY   glucose blood (ONETOUCH VERIO) test strip Use as instructed; BID   icosapent Ethyl (VASCEPA) 1 g capsule TAKE 2 CAPSULES BY MOUTH 2 TIMES DAILY.   losartan (COZAAR) 50 MG tablet TAKE 1 TABLET BY MOUTH EVERY DAY   magnesium oxide (MAG-OX) 400 (240 Mg) MG tablet TAKE 1 TABLET BY MOUTH TWICE A DAY   magnesium oxide (MAG-OX) 400 MG tablet Take 400 mg by mouth 2 (two) times daily.    metFORMIN (GLUCOPHAGE) 1000 MG tablet TAKE  1 TABLET (1,000 MG TOTAL) BY MOUTH TWICE A DAY WITH FOOD   metoprolol tartrate (LOPRESSOR) 25 MG tablet Take 1 tablet (25 mg total) by mouth 2 (two) times daily.   nystatin cream (MYCOSTATIN) APPLY TO AFFECTED AREA TWICE A DAY   OneTouch Delica Lancets [id number] MISC Use 3 times a day   rosuvastatin (CRESTOR) 5 MG tablet Take 1 tablet (5 mg total) by mouth daily.   sildenafil (REVATIO) 20 MG tablet Take 1 tablet (20 mg total) by mouth 3 (three) times daily as needed.   terazosin (HYTRIN) 1 MG capsule TAKE 1 CAPSULE BY MOUTH AT BEDTIME.   traMADol (ULTRAM) 50 MG tablet Take 50 mg by mouth  every 6 (six) hours as needed.   [DISCONTINUED] insulin degludec (TRESIBA FLEXTOUCH) 100 UNIT/ML FlexTouch Pen INJECT 30 UNITS INTO THE SKIN DAILY AT 10 PM.   [DISCONTINUED] Semaglutide,0.25 or 0.'5MG'$ /DOS, (OZEMPIC, 0.25 OR 0.5 MG/DOSE,) 2 MG/3ML SOPN Inject 0.5 mg into the skin once a week.   No facility-administered medications prior to visit.    Review of Systems     Objective    BP 106/66 (BP Location: Left Arm, Patient Position: Sitting, Cuff Size: Normal)   Pulse 76   Temp 98.6 F (37 C) (Oral)   Resp 16   Wt 241 lb 1.6 oz (109.4 kg)   BMI 34.59 kg/m    Physical Exam Vitals reviewed.  Constitutional:      General: He is not in acute distress.    Appearance: Normal appearance. He is not ill-appearing, toxic-appearing or diaphoretic.  Eyes:     Conjunctiva/sclera: Conjunctivae normal.  Neck:     Thyroid: No thyroid mass, thyromegaly or thyroid tenderness.     Vascular: No carotid bruit.  Cardiovascular:     Rate and Rhythm: Normal rate and regular rhythm.     Pulses: Normal pulses.     Heart sounds:     No friction rub. No gallop.  Pulmonary:     Effort: Pulmonary effort is normal. No respiratory distress.     Breath sounds: Normal breath sounds. No stridor. No wheezing, rhonchi or rales.  Abdominal:     General: Bowel sounds are normal. There is no distension.     Palpations: Abdomen is soft.     Tenderness: There is no abdominal tenderness.  Musculoskeletal:     Right lower leg: No edema.     Left lower leg: No edema.  Lymphadenopathy:     Cervical: No cervical adenopathy.  Skin:    Findings: No erythema or rash.  Neurological:     Mental Status: He is alert and oriented to person, place, and time.       Results for orders placed or performed in visit on 08/23/22  POCT glycosylated hemoglobin (Hb A1C)  Result Value Ref Range   Hemoglobin A1C 8.9 (A) 4.0 - 5.6 %   Est. average glucose Bld gHb Est-mCnc 209     Assessment & Plan     Problem List  Items Addressed This Visit       Endocrine   Diabetes mellitus ([place]) - Primary    Chronic, uncontrolled type 2 diabetes A1c is not at goal A1c was repeated today and increased to 8.9 Patient  Significant manage lifestyle including changing his diet and increasing physical activity to manage his diabetes Patient declines offer to adjust Tresiba dose He will continue [place] 30 units daily, refills provided He will continue Ozempic 0.5 mg daily, refills provided Patient will continue  metformin 1000 mg twice daily and Jardiance '25mg'$  daily       Relevant Medications   insulin degludec (TRESIBA FLEXTOUCH) 100 UNIT/ML FlexTouch Pen   Semaglutide,0.25 or 0.'5MG'$ /DOS, (OZEMPIC, 0.25 OR 0.5 MG/DOSE,) 2 MG/3ML SOPN   Other Relevant Orders   POCT glycosylated hemoglobin (Hb A1C) (Completed)   Other Visit Diagnoses     Poorly controlled type 2 diabetes mellitus (HCC)       Relevant Medications   insulin degludec (TRESIBA FLEXTOUCH) 100 UNIT/ML FlexTouch Pen   Semaglutide,0.25 or 0.'5MG'$ /DOS, (OZEMPIC, 0.25 OR 0.5 MG/DOSE,) 2 MG/3ML SOPN   Type 2 diabetes mellitus with other specified complication (HCC)       Relevant Medications   insulin degludec (TRESIBA FLEXTOUCH) 100 UNIT/ML FlexTouch Pen   Semaglutide,0.25 or 0.'5MG'$ /DOS, (OZEMPIC, 0.25 OR 0.5 MG/DOSE,) 2 MG/3ML SOPN   Other Relevant Orders   POCT glycosylated hemoglobin (Hb A1C) (Completed)       Return in about 3 months (around 11/27/2022) for awv and DM.       The entirety of the information documented in the History of Present Illness, Review of Systems and Physical Exam were personally obtained by me. Portions of this information were initially documented by Lyndel Pleasure, CMA. I, Jeffery Foster, MD have reviewed the documentation above for thoroughness and accuracy.      Jeffery Foster, MD  [facility] [phone] (phone) [phone] (fax)  [place]

## 2022-08-23 ENCOUNTER — Encounter: Payer: Self-pay | Admitting: Family Medicine

## 2022-08-23 ENCOUNTER — Ambulatory Visit (INDEPENDENT_AMBULATORY_CARE_PROVIDER_SITE_OTHER): Payer: Managed Care, Other (non HMO) | Admitting: Family Medicine

## 2022-08-23 VITALS — BP 106/66 | HR 76 | Temp 98.6°F | Resp 16 | Wt 241.1 lb

## 2022-08-23 DIAGNOSIS — E1169 Type 2 diabetes mellitus with other specified complication: Secondary | ICD-10-CM | POA: Diagnosis not present

## 2022-08-23 LAB — POCT GLYCOSYLATED HEMOGLOBIN (HGB A1C)
Est. average glucose Bld gHb Est-mCnc: 209
Hemoglobin A1C: 8.9 % — AB (ref 4.0–5.6)

## 2022-08-23 MED ORDER — TRESIBA FLEXTOUCH 100 UNIT/ML ~~LOC~~ SOPN: PEN_INJECTOR | SUBCUTANEOUS | 3 refills | Status: DC

## 2022-08-23 MED ORDER — OZEMPIC (0.25 OR 0.5 MG/DOSE) 2 MG/3ML ~~LOC~~ SOPN: 0.5000 mg | PEN_INJECTOR | SUBCUTANEOUS | 3 refills | Status: DC

## 2022-08-23 NOTE — Assessment & Plan Note (Signed)
Chronic, uncontrolled type 2 diabetes A1c is not at goal A1c was repeated today and increased to 8.9 Patient  Significant manage lifestyle including changing his diet and increasing physical activity to manage his diabetes Patient declines offer to adjust Tresiba dose He will continue [place] 30 units daily, refills provided He will continue Ozempic 0.5 mg daily, refills provided Patient will continue metformin 1000 mg twice daily and Jardiance '25mg'$  daily

## 2022-08-25 ENCOUNTER — Telehealth: Payer: Self-pay | Admitting: Family Medicine

## 2022-08-25 NOTE — Telephone Encounter (Signed)
Pt wants to change his RX to a 30 day script instead of a 50 day supply / pt should get 3 pens but they way it was sent they will only give him one pen/ Pharmacy advised they received script for 1 pen / please advise /  he needs three 51m pens at a time (each refill)

## 2022-08-26 MED ORDER — OZEMPIC (0.25 OR 0.5 MG/DOSE) 2 MG/3ML ~~LOC~~ SOPN: 0.5000 mg | PEN_INJECTOR | SUBCUTANEOUS | 3 refills | Status: DC

## 2022-08-26 NOTE — Telephone Encounter (Signed)
Rx has been re-submitted to 25m with three refills.   MEulis Foster MD  [facility]

## 2022-08-27 ENCOUNTER — Other Ambulatory Visit: Payer: Self-pay | Admitting: Family Medicine

## 2022-08-27 ENCOUNTER — Telehealth: Payer: Self-pay

## 2022-08-27 DIAGNOSIS — H9391 Unspecified disorder of right ear: Secondary | ICD-10-CM

## 2022-08-27 DIAGNOSIS — E1169 Type 2 diabetes mellitus with other specified complication: Secondary | ICD-10-CM

## 2022-08-27 NOTE — Telephone Encounter (Signed)
Pt returned call requesting to speak to the clinic. Tried calling the flow co. He says all he is asking for a 30 day supply instead of a 50 day supply.

## 2022-08-27 NOTE — Telephone Encounter (Signed)
Unable to refill per protocol, Rx request is too soon.  Requested Prescriptions  Pending Prescriptions Disp Refills   insulin degludec (TRESIBA FLEXTOUCH) 100 UNIT/ML FlexTouch Pen 3 mL 3    Sig: INJECT 30 UNITS INTO THE SKIN DAILY AT 10 PM.     Endocrinology:  Diabetes - Insulins Failed - 08/27/2022  3:25 PM      Failed - HBA1C is between 0 and 7.9 and within 180 days    Hemoglobin A1C  Date Value Ref Range Status  08/23/2022 8.9 (A) 4.0 - 5.6 % Final   Hgb A1c MFr Bld  Date Value Ref Range Status  11/25/2021 8.6 (H) 4.8 - 5.6 % Final    Comment:             Prediabetes: 5.7 - 6.4          Diabetes: >6.4          Glycemic control for adults with diabetes: <7.0          Passed - Valid encounter within last 6 months    Recent Outpatient Visits           4 days ago Type 2 diabetes mellitus with other specified complication, unspecified whether long term insulin use ([place])   [place] Simmons-Robinson, [place], MD   3 months ago Type 2 diabetes mellitus without complication, without long-term current use of insulin ([place])   [place] Simmons-Robinson, [place], MD   4 months ago Type 2 diabetes mellitus with other specified complication, without long-term current use of insulin ([place])   [place] Simmons-Robinson, [place], MD   6 months ago [place] Eulas Post, MD   9 months ago Annual physical exam   [facility] Eulas Post, MD       Future Appointments             In 3 months Simmons-Robinson, Riki Sheer, MD [facility], [facility]

## 2022-08-27 NOTE — Telephone Encounter (Signed)
Copied from [place] [id number]. Topic: Referral - Request for Referral >> Aug 27, 2022  1:41 PM Sabas Sous wrote: Has patient seen PCP for this complaint? Yes.   *If NO, is insurance requiring patient see PCP for this issue before PCP can refer them? Referral for which specialty: ENT Preferred provider/office: Local/Covered with [employer]  Reason for referral: Feels like ears have fluid, something viewed in right ear examination. Was told that PCP was going to refer him.

## 2022-08-27 NOTE — Telephone Encounter (Signed)
Disregard, explained that .5 MG once a week for 4 weeks adds up to '2MG'$ /3Ml.

## 2022-08-27 NOTE — Telephone Encounter (Signed)
Pt is requesting a 30 day supply for his insulin degludec (TRESIBA FLEXTOUCH) 100 UNIT/ML FlexTouch Pen  [facility] [place]

## 2022-08-27 NOTE — Telephone Encounter (Signed)
ENT referral has been sumitted per patient request   Jeffery Foster, MD  [facility]

## 2022-08-31 ENCOUNTER — Other Ambulatory Visit: Payer: Self-pay

## 2022-08-31 DIAGNOSIS — E1169 Type 2 diabetes mellitus with other specified complication: Secondary | ICD-10-CM

## 2022-08-31 NOTE — Telephone Encounter (Signed)
Copied from [place] [phone]. Topic: General - Other >> Aug 30, 2022  4:25 PM Cyndi Bender wrote: Reason for CRM: Pt called for an update regarding request for 30 day supply of the [place]. Pt also stated the referral that was sent for an ENT is out of network so he will need a referral to a [place] location.

## 2022-08-31 NOTE — Telephone Encounter (Signed)
Please contact patient to inform him that I am able to write for 2 boxes but the dose for 30 units daily can only be dispensed as 50 day supply.  Pharmacy will be able to run RX for 2 boxes worth of pens and see what the cost could be.   Please confirm if patient would like for me to send in 2 boxes worth of tresiba.   [name], MD  [facility]

## 2022-08-31 NOTE — Telephone Encounter (Signed)
FYI-Sent message to Parke Poisson about referral

## 2022-09-02 MED ORDER — TRESIBA FLEXTOUCH 100 UNIT/ML ~~LOC~~ SOPN: PEN_INJECTOR | SUBCUTANEOUS | 3 refills | Status: DC

## 2022-09-02 NOTE — Telephone Encounter (Signed)
Pt calling back, advised pt of message from Dr. Quentin Cornwall. Pt states that is not correct. Pt states he is wanting 30 DS which is 3 pens and that [facility] will fill this amount for $45 a month rather than 1 pen costing him $45. So pt is requesting 9 mL = 1 month supply of pens for him. Advised pt I would send this back and pt states he hopes this gets fixed soon because he will be out at the beginning of the week.

## 2022-09-02 NOTE — Addendum Note (Signed)
Addended by: Adolph Pollack on: 09/02/2022 12:10 PM   Modules accepted: Orders

## 2022-09-02 NOTE — Telephone Encounter (Signed)
Reviewed. [facility]   MEulis Foster MD  [facility]

## 2022-09-09 ENCOUNTER — Other Ambulatory Visit: Payer: Self-pay | Admitting: Family Medicine

## 2022-09-27 ENCOUNTER — Other Ambulatory Visit: Payer: Self-pay | Admitting: Family Medicine

## 2022-09-27 DIAGNOSIS — E1169 Type 2 diabetes mellitus with other specified complication: Secondary | ICD-10-CM

## 2022-10-21 ENCOUNTER — Other Ambulatory Visit: Payer: Self-pay | Admitting: Cardiovascular Disease

## 2022-11-01 ENCOUNTER — Telehealth: Payer: Self-pay | Admitting: Family Medicine

## 2022-11-01 NOTE — Telephone Encounter (Signed)
Covermymeds requesting prior authorization Key: [id number] Name: Jeffery West Ozempic 0.25 or 0.5 mg/dose 2mg /19ml pen injectors

## 2022-11-02 NOTE — Telephone Encounter (Signed)
Message from Plan CaseId:[id number];Status:Approved;Review Type:Prior Auth;Coverage Start Date:11/02/2022;Coverage End Date:11/02/2023;. Authorization Expiration Date: Nov 02, 2023.

## 2022-11-02 NOTE — Telephone Encounter (Signed)
PA Started:

## 2022-11-07 ENCOUNTER — Other Ambulatory Visit: Payer: Self-pay | Admitting: Family Medicine

## 2022-11-07 DIAGNOSIS — E119 Type 2 diabetes mellitus without complications: Secondary | ICD-10-CM

## 2022-12-02 ENCOUNTER — Encounter: Payer: Self-pay | Admitting: Family Medicine

## 2022-12-02 ENCOUNTER — Ambulatory Visit: Payer: Managed Care, Other (non HMO) | Admitting: Family Medicine

## 2022-12-02 NOTE — Progress Notes (Deleted)
I,Sulibeya S Dimas,acting as a Neurosurgeon for [employer], MD.,have documented all relevant documentation on the behalf of Ronnald Ramp, MD,as directed by  Ronnald Ramp, MD while in the presence of Ronnald Ramp, MD.    Complete physical exam   Patient: Jeffery West   DOB: July 04, 1955   67 y.o. Male  MRN: [id number] Visit Date: 12/02/2022  Today's healthcare provider: Ronnald Ramp, MD   No chief complaint on file.  Subjective    Jeffery West is a 67 y.o. male who presents today for a complete physical exam.  He reports consuming a {diet types:17450} diet. {Exercise:19826} He generally feels {well/fairly well/poorly:18703}. He reports sleeping {well/fairly well/poorly:18703}. He {does/does not:200015} have additional problems to discuss today.  HPI  ***  Past Medical History:  Diagnosis Date   Coronary artery disease    Diabetes mellitus without complication (HCC)    Erectile dysfunction    Gout    Heart murmur    Hyperlipidemia    Hypertension    Obesity    Osteoarthritis    Vitamin D deficiency    Past Surgical History:  Procedure Laterality Date   AORTIC VALVE REPLACEMENT N/A 11/26/2019   Procedure: AORTIC VALVE REPLACEMENT (AVR) USING INSPIRIS AORTIC VALVE SIZE ;  Surgeon: Alleen Borne, MD;  Location[facility] OR;  Service: Open Heart Surgery;  Laterality: N/A;   CARDIAC CATHETERIZATION     COLONOSCOPY WITH PROPOFOL N/A 08/24/2019   Procedure: COLONOSCOPY WITH PROPOFOL;  Surgeon: Midge Minium, MD;  Location: [facility] ENDOSCOPY;  Service: Endoscopy;  Laterality: N/A;   CORONARY ARTERY BYPASS GRAFT N/A 11/26/2019   Procedure: CORONARY ARTERY BYPASS GRAFTING (CABG) X Two , using left internal mammary artery and right leg greater saphenous vein harvested endoscopically.;  Surgeon: Alleen Borne, MD;  Location: [facility] OR;  Service: Open Heart Surgery;  Laterality: N/A;   LEFT HEART CATH AND CORONARY ANGIOGRAPHY Left 10/19/2019    Procedure: LEFT HEART CATH AND CORONARY ANGIOGRAPHY;  Surgeon: Antonieta Iba, MD;  Location: [facility] INVASIVE CV LAB;  Service: Cardiovascular;  Laterality: Left;   NO PAST SURGERIES     RIGHT HEART CATH N/A 10/19/2019   Procedure: RIGHT HEART CATH;  Surgeon: Antonieta Iba, MD;  Location: [facility] INVASIVE CV LAB;  Service: Cardiovascular;  Laterality: N/A;   SHOULDER ARTHROSCOPY WITH OPEN ROTATOR CUFF REPAIR Right 07/26/2017   Procedure: SHOULDER ARTHROSCOPY WITH OPEN ROTATOR CUFF REPAIR,SUBACROMINAL DECOMPRESSION,DISTAL CLAVICLE EXCISION;  Surgeon: Juanell Fairly, MD;  Location: [facility] ORS;  Service: Orthopedics;  Laterality: Right;   TEE WITHOUT CARDIOVERSION N/A 11/26/2019   Procedure: TRANSESOPHAGEAL ECHOCARDIOGRAM (TEE);  Surgeon: Alleen Borne, MD;  Location: [facility] OR;  Service: Open Heart Surgery;  Laterality: N/A;   Social History   Socioeconomic History   Marital status: Married    Spouse name: Not on file   Number of children: Not on file   Years of education: Not on file   Highest education level: Not on file  Occupational History   Not on file  Tobacco Use   Smoking status: Never   Smokeless tobacco: Never  Vaping Use   Vaping Use: Never used  Substance and Sexual Activity   Alcohol use: Yes    Alcohol/week: 0.0 standard drinks of alcohol    Comment: social   Drug use: No   Sexual activity: Not on file  Other Topics Concern   Not on file  Social History Narrative   Not on file   Social Determinants of Health  Financial Resource Strain: Not on file  Food Insecurity: Not on file  Transportation Needs: Not on file  Physical Activity: Not on file  Stress: Not on file  Social Connections: Not on file  Intimate Partner Violence: Not on file   Family Status  Relation Name Status   Mother  Alive   Father  Deceased   Sister Philippa Sicks   Brother  Alive   Family History  Problem Relation Age of Onset   Asthma Mother    Cancer Father        possibly in liver and  lung   Hypertension Brother    Allergies  Allergen Reactions   Penicillins Hives, Swelling and Other (See Comments)    Has patient had a PCN reaction causing immediate rash, facial/tongue/throat swelling, SOB or lightheadedness with hypotension: Unknown Has patient had a PCN reaction causing severe rash involving mucus membranes or skin necrosis: No Has patient had a PCN reaction that required hospitalization: no Has patient had a PCN reaction occurring within the last 10 years: No If all of the above answers are "NO", then may proceed with Cephalosporin use.     Patient Care Team: Ronnald Ramp, MD as PCP - General (Family Medicine) Antonieta Iba, MD as PCP - Cardiology (Cardiology)   Medications: Outpatient Medications Prior to Visit  Medication Sig   acetaminophen (TYLENOL) 500 MG tablet Take 1,000 mg by mouth every 6 (six) hours as needed for moderate pain or headache.   albuterol (VENTOLIN HFA) 108 (90 Base) MCG/ACT inhaler INHALE 1 PUFF INTO THE LUNGS EVERY 6 HOURS AS NEEDED FOR WHEEZING OR SHORTNESS OF BREATH.   allopurinol (ZYLOPRIM) 300 MG tablet TAKE 1 TABLET BY MOUTH EVERY DAY   aspirin EC 81 MG tablet Take 81 mg by mouth daily. Swallow whole.   BD PEN NEEDLE NANO 2ND GEN 32G X 4 MM MISC USE AS DIRECTED   Cholecalciferol (VITAMIN D) 2000 units CAPS Take 2,000 Units by mouth every evening.    ezetimibe (ZETIA) 10 MG tablet TAKE 1 TABLET BY MOUTH EVERY DAY   glucose blood (ONETOUCH VERIO) test strip Use as instructed; BID   icosapent Ethyl (VASCEPA) 1 g capsule TAKE 2 CAPSULES BY MOUTH 2 TIMES DAILY.   insulin degludec (TRESIBA FLEXTOUCH) 100 UNIT/ML FlexTouch Pen INJECT 30 UNITS INTO THE SKIN DAILY AT 10 PM.   JARDIANCE 25 MG TABS tablet TAKE 1 TABLET BY MOUTH DAILY BEFORE BREAKFAST.   losartan (COZAAR) 50 MG tablet TAKE 1 TABLET BY MOUTH EVERY DAY   magnesium oxide (MAG-OX) 400 (240 Mg) MG tablet TAKE 1 TABLET BY MOUTH TWICE A DAY   magnesium oxide (MAG-OX)  400 MG tablet Take 400 mg by mouth 2 (two) times daily.    metFORMIN (GLUCOPHAGE) 1000 MG tablet TAKE 1 TABLET (1,000 MG TOTAL) BY MOUTH TWICE A DAY WITH FOOD   metoprolol tartrate (LOPRESSOR) 25 MG tablet Take 1 tablet (25 mg total) by mouth 2 (two) times daily.   nystatin cream (MYCOSTATIN) APPLY TO AFFECTED AREA TWICE A DAY   OneTouch Delica Lancets 30G MISC Use 3 times a day   rosuvastatin (CRESTOR) 5 MG tablet TAKE 1 TABLET (5 MG TOTAL) BY MOUTH DAILY.   Semaglutide,0.25 or 0.5MG /DOS, (OZEMPIC, 0.25 OR 0.5 MG/DOSE,) 2 MG/3ML SOPN Inject 0.5 mg into the skin once a week.   sildenafil (REVATIO) 20 MG tablet Take 1 tablet (20 mg total) by mouth 3 (three) times daily as needed.   terazosin (HYTRIN) 1 MG capsule  TAKE 1 CAPSULE BY MOUTH AT BEDTIME.   traMADol (ULTRAM) 50 MG tablet Take 50 mg by mouth every 6 (six) hours as needed.   No facility-administered medications prior to visit.    Review of Systems  {Labs  Heme  Chem  Endocrine  Serology  Results Review (optional):23779}  Objective    There were no vitals taken for this visit. {Show previous vital signs (optional):23777}   Physical Exam  ***  Last depression screening scores    08/23/2022    2:22 PM 11/25/2021    9:20 AM 10/06/2021    2:52 PM  PHQ 2/9 Scores  PHQ - 2 Score 0 0 0  PHQ- 9 Score  1    Last fall risk screening    08/23/2022    2:22 PM  Fall Risk   Falls in the past year? 0  Number falls in past yr: 0  Injury with Fall? 0  Risk for fall due to : No Fall Risks   Last Audit-C alcohol use screening    08/23/2022    2:23 PM  Alcohol Use Disorder Test (AUDIT)  1. How often do you have a drink containing alcohol? 0  2. How many drinks containing alcohol do you have on a typical day when you are drinking? 0  3. How often do you have six or more drinks on one occasion? 0  AUDIT-C Score 0   A score of 3 or more in women, and 4 or more in men indicates increased risk for alcohol abuse, EXCEPT if all of the  points are from question 1   No results found for any visits on 12/02/22.  Assessment & Plan    Routine Health Maintenance and Physical Exam  Exercise Activities and Dietary recommendations  Goals   None     Immunization History  Administered Date(s) Administered   Fluad Quad(high Dose 65+) 03/31/2022   Influenza Split 07/05/2011   Influenza,inj,Quad PF,6+ Mos 05/28/2013, 04/18/2018, 04/01/2020   Influenza-Unspecified 04/29/2017, 04/21/2021   PFIZER(Purple Top)SARS-COV-2 Vaccination 09/19/2019, 10/10/2019   PNEUMOCOCCAL CONJUGATE-20 05/05/2022   Pneumococcal Polysaccharide-23 04/29/2017   Td 12/30/2003   Tdap 05/23/2017   Zoster Recombinat (Shingrix) 03/10/2019, 06/01/2019    Health Maintenance  Topic Date Due   COVID-19 Vaccine (3 - 2023-24 season) 02/19/2022   Diabetic kidney evaluation - eGFR measurement  11/26/2022   INFLUENZA VACCINE  01/20/2023   HEMOGLOBIN A1C  02/23/2023   Diabetic kidney evaluation - Urine ACR  04/01/2023   FOOT EXAM  04/01/2023   OPHTHALMOLOGY EXAM  04/01/2023   Colonoscopy  08/23/2024   DTaP/Tdap/Td (3 - Td or Tdap) 05/24/2027   Pneumonia Vaccine 18+ Years old  Completed   Hepatitis C Screening  Completed   HIV Screening  Completed   Zoster Vaccines- Shingrix  Completed   HPV VACCINES  Aged Out    Problem List Items Addressed This Visit   None    No follow-ups on file.      The entirety of the information documented in the History of Present Illness, Review of Systems and Physical Exam were personally obtained by me. Portions of this information were initially documented by *** . I, Ronnald Ramp, MD have reviewed the documentation above for thoroughness and accuracy.      Ronnald Ramp, MD  [facility] [phone] (phone) [phone] (fax)  [facility]

## 2022-12-15 ENCOUNTER — Other Ambulatory Visit: Payer: Self-pay | Admitting: Cardiovascular Disease

## 2022-12-15 DIAGNOSIS — I25118 Atherosclerotic heart disease of native coronary artery with other forms of angina pectoris: Secondary | ICD-10-CM

## 2022-12-15 DIAGNOSIS — Z952 Presence of prosthetic heart valve: Secondary | ICD-10-CM

## 2022-12-15 DIAGNOSIS — I350 Nonrheumatic aortic (valve) stenosis: Secondary | ICD-10-CM

## 2022-12-17 ENCOUNTER — Other Ambulatory Visit: Payer: Self-pay | Admitting: Family Medicine

## 2022-12-17 DIAGNOSIS — I10 Essential (primary) hypertension: Secondary | ICD-10-CM

## 2022-12-20 ENCOUNTER — Other Ambulatory Visit: Payer: Self-pay | Admitting: Family Medicine

## 2022-12-20 ENCOUNTER — Other Ambulatory Visit: Payer: Self-pay

## 2022-12-20 DIAGNOSIS — E1169 Type 2 diabetes mellitus with other specified complication: Secondary | ICD-10-CM

## 2022-12-26 NOTE — Progress Notes (Unsigned)
Cardiology Office Note  Date:  12/27/2022   ID:  Jeffery West, DOB 18-Mar-1956, MRN [id number]  PCP:  Ronnald Ramp, MD   Chief Complaint  Patient presents with   12 month follow up     "Doing well." Medications reviewed by the patient verbally.     HPI:  Jeffery West is a 67 year old gentleman with past medical history of Diabetes   elevated hemoglobin A1c 9 No significant smoking hx HTN Hyperlipidemia Morbid obesity  aortic valve stenosis  on echocardiogram June 2017 Who presents for follow-up of his coronary disease, aortic valve replacement with CABG x2 in 11/26/2019.  Last seen in clinic by myself May 2023 On Ozempic, weight stable, has not been losing much recently Reports weight is down 30 pounds from prior heart surgery 2021  Continues to work in [employer], no regular exercise program  Lab work reviewed A1C 8.9 up from 7.5 Total cholesterol 134 LDL 60  Echocardiogram July 2022, normal ejection fraction, stable prosthetic aortic valve  Denies significant shortness of breath, no leg edema, no PND orthopnea  EKG personally reviewed by myself on todays visit EKG Interpretation Date/Time:  Monday December 27 2022 13:55:05 EDT Ventricular Rate:  74 PR Interval:  220 QRS Duration:  100 QT Interval:  376 QTC Calculation: 417 R Axis:   52  Text Interpretation: Sinus rhythm with 1st degree A-V block Incomplete right bundle branch block Cannot rule out Inferior infarct , age undetermined When compared with ECG of 27-Nov-2019 06:35, No significant change was found Confirmed by Julien Nordmann [phone]) on 12/27/2022 2:15:00 PM    Cardiac work-up   Echo 09/28/2019 showed increase in aortic valve mean gradient of 53.5 mmHg, LVEF 60 to 65%, grade 1 diastolic dysfunction.    Cardiac catheterization 10/19/2019 showed 80% stenosis to mid LAD, 70% first diagonal stenosis.   Underwent coronary bypass graft surgery and bioprosthetic aortic valve replacement AVR (23mm Edwards  Inspiris Resilia pericardial valve) and CABGx2 (LIMA-LAD, SVG-first diagonal) 11/26/2019.   PMH:   has a past medical history of Coronary artery disease, Diabetes mellitus without complication (HCC), Erectile dysfunction, Gout, Heart murmur, Hyperlipidemia, Hypertension, Obesity, Osteoarthritis, and Vitamin D deficiency.  PSH:    Past Surgical History:  Procedure Laterality Date   AORTIC VALVE REPLACEMENT N/A 11/26/2019   Procedure: AORTIC VALVE REPLACEMENT (AVR) USING INSPIRIS AORTIC VALVE SIZE ;  Surgeon: Alleen Borne, MD;  Location: [facility] OR;  Service: Open Heart Surgery;  Laterality: N/A;   CARDIAC CATHETERIZATION     COLONOSCOPY WITH PROPOFOL N/A 08/24/2019   Procedure: COLONOSCOPY WITH PROPOFOL;  Surgeon: Midge Minium, MD;  Location: [facility] ENDOSCOPY;  Service: Endoscopy;  Laterality: N/A;   CORONARY ARTERY BYPASS GRAFT N/A 11/26/2019   Procedure: CORONARY ARTERY BYPASS GRAFTING (CABG) X Two , using left internal mammary artery and right leg greater saphenous vein harvested endoscopically.;  Surgeon: Alleen Borne, MD;  Location: [facility] OR;  Service: Open Heart Surgery;  Laterality: N/A;   LEFT HEART CATH AND CORONARY ANGIOGRAPHY Left 10/19/2019   Procedure: LEFT HEART CATH AND CORONARY ANGIOGRAPHY;  Surgeon: Antonieta Iba, MD;  Location: [facility] INVASIVE CV LAB;  Service: Cardiovascular;  Laterality: Left;   NO PAST SURGERIES     RIGHT HEART CATH N/A 10/19/2019   Procedure: RIGHT HEART CATH;  Surgeon: Antonieta Iba, MD;  Location: [facility] INVASIVE CV LAB;  Service: Cardiovascular;  Laterality: N/A;   SHOULDER ARTHROSCOPY WITH OPEN ROTATOR CUFF REPAIR Right 07/26/2017   Procedure: SHOULDER ARTHROSCOPY WITH OPEN ROTATOR  CUFF REPAIR,SUBACROMINAL DECOMPRESSION,DISTAL CLAVICLE EXCISION;  Surgeon: Juanell Fairly, MD;  Location: [facility] ORS;  Service: Orthopedics;  Laterality: Right;   TEE WITHOUT CARDIOVERSION N/A 11/26/2019   Procedure: TRANSESOPHAGEAL ECHOCARDIOGRAM (TEE);  Surgeon: Alleen Borne, MD;   Location: [facility] OR;  Service: Open Heart Surgery;  Laterality: N/A;    Current Outpatient Medications  Medication Sig Dispense Refill   acetaminophen (TYLENOL) 500 MG tablet Take 1,000 mg by mouth every 6 (six) hours as needed for moderate pain or headache.     albuterol (VENTOLIN HFA) 108 (90 Base) MCG/ACT inhaler INHALE 1 PUFF INTO THE LUNGS EVERY 6 HOURS AS NEEDED FOR WHEEZING OR SHORTNESS OF BREATH. 8.5 each 3   allopurinol (ZYLOPRIM) 300 MG tablet TAKE 1 TABLET BY MOUTH EVERY DAY 90 tablet 2   aspirin EC 81 MG tablet Take 81 mg by mouth daily. Swallow whole.     BD PEN NEEDLE NANO 2ND GEN 32G X 4 MM MISC USE AS DIRECTED 100 each 8   Cholecalciferol (VITAMIN D) 2000 units CAPS Take 2,000 Units by mouth every evening.      cyclobenzaprine (FLEXERIL) 5 MG tablet Take 5 mg by mouth at bedtime.     ezetimibe (ZETIA) 10 MG tablet TAKE 1 TABLET BY MOUTH EVERY DAY 90 tablet 2   glucose blood (ONETOUCH VERIO) test strip Use as instructed; BID 100 each 12   icosapent Ethyl (VASCEPA) 1 g capsule TAKE 2 CAPSULES BY MOUTH 2 TIMES DAILY. 360 capsule 3   insulin degludec (TRESIBA FLEXTOUCH) 100 UNIT/ML FlexTouch Pen INJECT 30 UNITS SUBCUTANEOUSLY ONCE DAILY AT 10:00PM 9 mL 3   JARDIANCE 25 MG TABS tablet TAKE 1 TABLET BY MOUTH DAILY BEFORE BREAKFAST. 90 tablet 1   losartan (COZAAR) 50 MG tablet TAKE 1 TABLET BY MOUTH EVERY DAY 90 tablet 1   magnesium oxide (MAG-OX) 400 (240 Mg) MG tablet TAKE 1 TABLET BY MOUTH TWICE A DAY 180 tablet 3   metFORMIN (GLUCOPHAGE) 1000 MG tablet TAKE 1 TABLET (1,000 MG TOTAL) BY MOUTH TWICE A DAY WITH FOOD 180 tablet 0   metoprolol tartrate (LOPRESSOR) 25 MG tablet TAKE 1 TABLET BY MOUTH TWICE A DAY 60 tablet 0   nystatin cream (MYCOSTATIN) APPLY TO AFFECTED AREA TWICE A DAY 30 g 6   OneTouch Delica Lancets 30G MISC Use 3 times a day 100 each 12   rosuvastatin (CRESTOR) 5 MG tablet TAKE 1 TABLET (5 MG TOTAL) BY MOUTH DAILY. 30 tablet 1   Semaglutide,0.25 or 0.5MG /DOS,  (OZEMPIC, 0.25 OR 0.5 MG/DOSE,) 2 MG/3ML SOPN Inject 0.5 mg into the skin once a week. 3 mL 3   traMADol (ULTRAM) 50 MG tablet Take 50 mg by mouth every 6 (six) hours as needed.     sildenafil (REVATIO) 20 MG tablet Take 1 tablet (20 mg total) by mouth 3 (three) times daily as needed. (Patient not taking: Reported on 12/27/2022) 90 tablet 3   terazosin (HYTRIN) 1 MG capsule TAKE 1 CAPSULE BY MOUTH AT BEDTIME. (Patient not taking: Reported on 12/27/2022) 90 capsule 1   No current facility-administered medications for this visit.    Allergies:   Penicillins   Social History:  The patient  reports that he has never smoked. He has never used smokeless tobacco. He reports current alcohol use. He reports that he does not use drugs.   Family History:   family history includes Asthma in his mother; Cancer in his father; Hypertension in his brother.    Review of Systems: Review  of Systems  Constitutional: Negative.   HENT: Negative.    Respiratory: Negative.    Cardiovascular: Negative.   Gastrointestinal: Negative.   Musculoskeletal: Negative.   Neurological: Negative.   Psychiatric/Behavioral: Negative.    All other systems reviewed and are negative.   PHYSICAL EXAM: VS:  BP (!) 110/54 (BP Location: Left Arm, Patient Position: Sitting, Cuff Size: Normal)   Pulse 74   Ht 5\' 11"  (1.803 m)   Wt 243 lb 6 oz (110.4 kg)   SpO2 96%   BMI 33.94 kg/m  , BMI Body mass index is 33.94 kg/m. Constitutional:  oriented to person, place, and time. No distress.  HENT:  Head: Grossly normal Eyes:  no discharge. No scleral icterus.  Neck: No JVD, no carotid bruits  Cardiovascular: Regular rate and rhythm, no murmurs appreciated Pulmonary/Chest: Clear to auscultation bilaterally, no wheezes or rails Abdominal: Soft.  no distension.  no tenderness.  Musculoskeletal: Normal range of motion Neurological:  normal muscle tone. Coordination normal. No atrophy Skin: Skin warm and dry Psychiatric: normal  affect, pleasant   Recent Labs: No results found for requested labs within last 365 days.   Lipid Panel Lab Results  Component Value Date   CHOL 134 07/15/2021   HDL 33 (L) 07/15/2021   LDLCALC 60 07/15/2021   TRIG 254 (H) 07/15/2021      Wt Readings from Last 3 Encounters:  12/27/22 243 lb 6 oz (110.4 kg)  08/23/22 241 lb 1.6 oz (109.4 kg)  05/05/22 241 lb 8 oz (109.5 kg)     ASSESSMENT AND PLAN:  Problem List Items Addressed This Visit       Cardiology Problems   Aortic valve stenosis   Relevant Orders   EKG 12-Lead (Completed)     Other   Morbid obesity (HCC)   S/P AVR   Relevant Orders   EKG 12-Lead (Completed)   Other Visit Diagnoses     Coronary artery disease of native artery of native heart with stable angina pectoris (HCC)    -  Primary   Relevant Medications   cyclobenzaprine (FLEXERIL) 5 MG tablet   Other Relevant Orders   EKG 12-Lead (Completed)   Poorly controlled type 2 diabetes mellitus (HCC)       Essential hypertension       Relevant Orders   EKG 12-Lead (Completed)   Hyperlipidemia LDL goal <70         CAD with chronic stable angina CABG x2 2021, Currently with no symptoms of angina. No further workup at this time. Continue current medication regimen. Stressed importance of working on his diabetes  Diabetes type 2 with complications A1c running higher 8.9, reports some diet indiscretion Weight stable on Ozempic Working closely with primary care  Aortic valve stenosis, status post AVR  bioprosthetic valve placement 2021 echocardiogram completed July 2022, stable Consider repeat echo next year  Hyperlipidemia Cholesterol is at goal on the current lipid regimen. No changes to the medications were made.   Total encounter time more than 30 minutes  Greater than 50% was spent in counseling and coordination of care with the patient    Signed, Dossie Arbour, M.D., Ph.D. [facility] [place],  [place] [phone]

## 2022-12-27 ENCOUNTER — Encounter: Payer: Self-pay | Admitting: Cardiovascular Disease

## 2022-12-27 ENCOUNTER — Ambulatory Visit: Payer: Managed Care, Other (non HMO) | Attending: Cardiovascular Disease | Admitting: Cardiovascular Disease

## 2022-12-27 VITALS — BP 110/54 | HR 74 | Ht 71.0 in | Wt 243.4 lb

## 2022-12-27 DIAGNOSIS — I25118 Atherosclerotic heart disease of native coronary artery with other forms of angina pectoris: Secondary | ICD-10-CM | POA: Diagnosis not present

## 2022-12-27 DIAGNOSIS — E1165 Type 2 diabetes mellitus with hyperglycemia: Secondary | ICD-10-CM

## 2022-12-27 DIAGNOSIS — Z952 Presence of prosthetic heart valve: Secondary | ICD-10-CM

## 2022-12-27 DIAGNOSIS — I350 Nonrheumatic aortic (valve) stenosis: Secondary | ICD-10-CM

## 2022-12-27 DIAGNOSIS — I10 Essential (primary) hypertension: Secondary | ICD-10-CM

## 2022-12-27 DIAGNOSIS — E785 Hyperlipidemia, unspecified: Secondary | ICD-10-CM

## 2022-12-27 DIAGNOSIS — Z7984 Long term (current) use of oral hypoglycemic drugs: Secondary | ICD-10-CM

## 2022-12-27 MED ORDER — ROSUVASTATIN CALCIUM 5 MG PO TABS: 5.0000 mg | ORAL_TABLET | Freq: Every day | ORAL | 3 refills | Status: DC

## 2022-12-27 MED ORDER — METOPROLOL TARTRATE 25 MG PO TABS
25.0000 mg | ORAL_TABLET | Freq: Two times a day (BID) | ORAL | 3 refills | Status: DC
Start: 2022-12-27 — End: 2023-12-27

## 2022-12-27 NOTE — Patient Instructions (Signed)
Medication Instructions:  No changes  If you need a refill on your cardiac medications before your next appointment, please call your pharmacy.   Lab work: No new labs needed  Testing/Procedures: No new testing needed  Follow-Up: At [facility], you and your health needs are our priority.  As part of our continuing mission to provide you with exceptional heart care, we have created designated Provider Care Teams.  These Care Teams include your primary Cardiologist (physician) and Advanced Practice Providers (APPs -  Physician Assistants and Nurse Practitioners) who all work together to provide you with the care you need, when you need it.  You will need a follow up appointment in 12 months  Providers on your designated Care Team:   Christopher Berge, NP Ryan Dunn, PA-C Cadence Furth, PA-C  COVID-19 Vaccine Information can be found at: [email or web address] For questions related to vaccine distribution or appointments, please email [email or web address] or call [phone].   

## 2022-12-30 ENCOUNTER — Ambulatory Visit: Payer: Managed Care, Other (non HMO) | Admitting: Family Medicine

## 2023-01-03 ENCOUNTER — Encounter: Payer: Self-pay | Admitting: Family Medicine

## 2023-01-03 ENCOUNTER — Ambulatory Visit (INDEPENDENT_AMBULATORY_CARE_PROVIDER_SITE_OTHER): Payer: Managed Care, Other (non HMO) | Admitting: Family Medicine

## 2023-01-03 VITALS — BP 110/60 | HR 71 | Ht 71.0 in | Wt 239.4 lb

## 2023-01-03 DIAGNOSIS — K921 Melena: Secondary | ICD-10-CM

## 2023-01-03 DIAGNOSIS — I10 Essential (primary) hypertension: Secondary | ICD-10-CM

## 2023-01-03 DIAGNOSIS — E1169 Type 2 diabetes mellitus with other specified complication: Secondary | ICD-10-CM

## 2023-01-03 DIAGNOSIS — E785 Hyperlipidemia, unspecified: Secondary | ICD-10-CM

## 2023-01-03 DIAGNOSIS — E119 Type 2 diabetes mellitus without complications: Secondary | ICD-10-CM | POA: Diagnosis not present

## 2023-01-03 NOTE — Assessment & Plan Note (Signed)
On Losartan 50 mg daily. Recent blood pressure readings have been on the lower side (110/60 today, 105/54 at recent cardiology visit). Patient reports feeling tired but denies dizziness or lightheadedness. -Chronic -well controlled although concern for hypotension  -Consider reducing from 50mg  to  Losartan to 25 mg daily, but patient reports difficulty with pill cutting due to small size of tablet. -Continue Losartan 50 mg daily for now and monitor blood pressure in ambulatory setting over next three months. -Review blood pressure readings at three-month follow-up visit.

## 2023-01-03 NOTE — Assessment & Plan Note (Signed)
On Crestor 5 mg daily. -Chronic  -Order lipid panel today.

## 2023-01-03 NOTE — Assessment & Plan Note (Signed)
On Tresiba 30 units daily, Ozempic 0.5 mg weekly, Metformin 1000 mg twice daily, and Jardiance 25 mg daily. Last A1C in March was 8.9. Patient reports occasional blood glucose monitoring with a recent reading of 176 postprandial. -Chronic  -last A1c not at goal of less than 7  -Order A1C, CMP today. -Encourage more consistent blood glucose monitoring.

## 2023-01-03 NOTE — Progress Notes (Signed)
Established patient visit   Patient: Jeffery West   DOB: 1955-09-10   67 y.o. Male  MRN: [id number] Visit Date: 01/03/2023  Today's healthcare provider: Ronnald Ramp, MD   Chief Complaint  Patient presents with   Medical Management of Chronic Issues    Patient is present for DM follow-up. Patient was last seen on 08/23/22. Patient was to continue [place] 30 units daily, Ozempic 0.5 mg weekly, metformin 1000 mg twice daily and Jardiance 25mg  daily. Patient reports checking his sugar yesterday and it was 176 that afternoon and e has been taking medications as prescribed. Symptoms fatigue and excessive urinating.     Subjective     HPI     Medical Management of Chronic Issues    Additional comments: Patient is present for DM follow-up. Patient was last seen on 08/23/22. Patient was to continue [place] 30 units daily, Ozempic 0.5 mg weekly, metformin 1000 mg twice daily and Jardiance 25mg  daily. Patient reports checking his sugar yesterday and it was 176 that afternoon and e has been taking medications as prescribed. Symptoms fatigue and excessive urinating.        Last edited by Ronnald Ramp, MD on 01/03/2023 11:16 AM.      Diabetes  Lab Results  Component Value Date   HGBA1C 8.9 (A) 08/23/2022   Discussed the use of AI scribe software for clinical note transcription with the patient, who gave verbal consent to proceed.  History of Present Illness   Diabetes  The patient, with a history of diabetes, hypertension, and hyperlipidemia, presents for a routine follow-up. He reports a recent blood glucose level of 176, which was taken postprandially. He admits to not regularly checking his fasting glucose levels due to an early work schedule and a reluctance to prick himself early in the morning. He reports attempting to manage his diabetes through dietary modifications, including portion control and limiting sweet intake to occasional small amounts of  chocolate.  HTN  The patient denies any symptoms of hypotension such as dizziness or lightheadedness, despite recent blood pressure readings on the lower side. He does, however, report feeling tired frequently, but this does not interfere with his daily activities, including yard work. He also mentions a tendency to doze off if he sits still for a while.He expresses some uncertainty about the need for his blood pressure medications, as he does not recall ever being explicitly told he has high blood pressure. He is willing to continue his current regimen of losartan until his next follow-up in three months.      The patient is not currently taking terazosin, a medication previously prescribed for his prostate, as he believes it was causing increased urination. He is compliant with his other medications, including Tresiba, Ozempic, metoprolol, losartan, and Crestor.     Medications: Outpatient Medications Prior to Visit  Medication Sig   acetaminophen (TYLENOL) 500 MG tablet Take 1,000 mg by mouth every 6 (six) hours as needed for moderate pain or headache.   albuterol (VENTOLIN HFA) 108 (90 Base) MCG/ACT inhaler INHALE 1 PUFF INTO THE LUNGS EVERY 6 HOURS AS NEEDED FOR WHEEZING OR SHORTNESS OF BREATH.   allopurinol (ZYLOPRIM) 300 MG tablet TAKE 1 TABLET BY MOUTH EVERY DAY   aspirin EC 81 MG tablet Take 81 mg by mouth daily. Swallow whole.   BD PEN NEEDLE NANO 2ND GEN 32G X 4 MM MISC USE AS DIRECTED   Cholecalciferol (VITAMIN D) 2000 units CAPS Take 2,000 Units by mouth  every evening.    cyclobenzaprine (FLEXERIL) 5 MG tablet Take 5 mg by mouth at bedtime.   ezetimibe (ZETIA) 10 MG tablet TAKE 1 TABLET BY MOUTH EVERY DAY   glucose blood (ONETOUCH VERIO) test strip Use as instructed; BID   icosapent Ethyl (VASCEPA) 1 g capsule TAKE 2 CAPSULES BY MOUTH 2 TIMES DAILY.   insulin degludec (TRESIBA FLEXTOUCH) 100 UNIT/ML FlexTouch Pen INJECT 30 UNITS SUBCUTANEOUSLY ONCE DAILY AT 10:00PM   JARDIANCE 25  MG TABS tablet TAKE 1 TABLET BY MOUTH DAILY BEFORE BREAKFAST.   losartan (COZAAR) 50 MG tablet TAKE 1 TABLET BY MOUTH EVERY DAY   magnesium oxide (MAG-OX) 400 (240 Mg) MG tablet TAKE 1 TABLET BY MOUTH TWICE A DAY   metFORMIN (GLUCOPHAGE) 1000 MG tablet TAKE 1 TABLET (1,000 MG TOTAL) BY MOUTH TWICE A DAY WITH FOOD   metoprolol tartrate (LOPRESSOR) 25 MG tablet Take 1 tablet (25 mg total) by mouth 2 (two) times daily.   nystatin cream (MYCOSTATIN) APPLY TO AFFECTED AREA TWICE A DAY   OneTouch Delica Lancets 30G MISC Use 3 times a day   rosuvastatin (CRESTOR) 5 MG tablet Take 1 tablet (5 mg total) by mouth daily.   Semaglutide,0.25 or 0.5MG /DOS, (OZEMPIC, 0.25 OR 0.5 MG/DOSE,) 2 MG/3ML SOPN Inject 0.5 mg into the skin once a week.   sildenafil (REVATIO) 20 MG tablet Take 1 tablet (20 mg total) by mouth 3 (three) times daily as needed.   traMADol (ULTRAM) 50 MG tablet Take 50 mg by mouth every 6 (six) hours as needed.   [DISCONTINUED] terazosin (HYTRIN) 1 MG capsule TAKE 1 CAPSULE BY MOUTH AT BEDTIME.   No facility-administered medications prior to visit.    Review of Systems       Objective    BP 110/60 (BP Location: Left Arm, Patient Position: Sitting, Cuff Size: Normal)   Pulse 71   Ht 5\' 11"  (1.803 m)   Wt 239 lb 6.4 oz (108.6 kg)   SpO2 98%   BMI 33.39 kg/m      Physical Exam Vitals reviewed.  Constitutional:      General: He is not in acute distress.    Appearance: Normal appearance. He is not ill-appearing, toxic-appearing or diaphoretic.  Eyes:     Conjunctiva/sclera: Conjunctivae normal.  Cardiovascular:     Rate and Rhythm: Normal rate and regular rhythm.     Pulses: Normal pulses.     Heart sounds: Normal heart sounds. No murmur heard.    No friction rub. No gallop.  Pulmonary:     Effort: Pulmonary effort is normal. No respiratory distress.     Breath sounds: Normal breath sounds. No stridor. No wheezing, rhonchi or rales.  Abdominal:     General: Bowel  sounds are normal. There is no distension.     Palpations: Abdomen is soft.     Tenderness: There is no abdominal tenderness.  Musculoskeletal:     Right lower leg: No edema.     Left lower leg: No edema.  Skin:    Findings: No erythema or rash.  Neurological:     Mental Status: He is alert and oriented to person, place, and time.       No results found for any visits on 01/03/23.  Assessment & Plan     Problem List Items Addressed This Visit     Diabetes mellitus (HCC) - Primary    On Tresiba 30 units daily, Ozempic 0.5 mg weekly, Metformin 1000 mg twice daily, and Jardiance  25 mg daily. Last A1C in March was 8.9. Patient reports occasional blood glucose monitoring with a recent reading of 176 postprandial. -Chronic  -last A1c not at goal of less than 7  -Order A1C, CMP today. -Encourage more consistent blood glucose monitoring.      Relevant Orders   Hemoglobin A1c   HLD (hyperlipidemia)     On Crestor 5 mg daily. -Chronic  -Order lipid panel today.      HTN (hypertension)    On Losartan 50 mg daily. Recent blood pressure readings have been on the lower side (110/60 today, 105/54 at recent cardiology visit). Patient reports feeling tired but denies dizziness or lightheadedness. -Chronic -well controlled although concern for hypotension  -Consider reducing from 50mg  to  Losartan to 25 mg daily, but patient reports difficulty with pill cutting due to small size of tablet. -Continue Losartan 50 mg daily for now and monitor blood pressure in ambulatory setting over next three months. -Review blood pressure readings at three-month follow-up visit.      Relevant Orders   CMP14+EGFR   Other Visit Diagnoses     Serum calcium elevated       Relevant Orders   CMP14+EGFR   Blood in stool               Return in about 3 months (around 04/05/2023) for DM, HTN.         Ronnald Ramp, MD  [facility] [phone]  (phone) [phone] (fax)  [facility]

## 2023-01-04 ENCOUNTER — Telehealth: Payer: Self-pay

## 2023-01-04 LAB — LIPID PANEL
Chol/HDL Ratio: 4.2 ratio (ref 0.0–5.0)
Cholesterol, Total: 135 mg/dL (ref 100–199)
HDL: 32 mg/dL — ABNORMAL LOW (ref >39)
LDL Chol Calc (NIH): 57 mg/dL (ref 0–99)
Triglycerides: 293 mg/dL — ABNORMAL HIGH (ref 0–149)
VLDL Cholesterol Cal: 46 mg/dL — ABNORMAL HIGH (ref 5–40)

## 2023-01-04 LAB — CMP14+EGFR
ALT: 25 IU/L (ref 0–44)
AST: 23 IU/L (ref 0–40)
Albumin: 4.5 g/dL (ref 3.9–4.9)
Alkaline Phosphatase: 80 IU/L (ref 44–121)
BUN/Creatinine Ratio: 25 — ABNORMAL HIGH (ref 10–24)
BUN: 23 mg/dL (ref 8–27)
Bilirubin Total: 0.7 mg/dL (ref 0.0–1.2)
CO2: 22 mmol/L (ref 20–29)
Calcium: 9.7 mg/dL (ref 8.6–10.2)
Chloride: 99 mmol/L (ref 96–106)
Creatinine, Ser: 0.92 mg/dL (ref 0.76–1.27)
Globulin, Total: 2.6 g/dL (ref 1.5–4.5)
Glucose: 187 mg/dL — ABNORMAL HIGH (ref 70–99)
Potassium: 4.8 mmol/L (ref 3.5–5.2)
Sodium: 138 mmol/L (ref 134–144)
Total Protein: 7.1 g/dL (ref 6.0–8.5)
eGFR: 92 mL/min/{1.73_m2} (ref >59)

## 2023-01-04 LAB — HEMOGLOBIN A1C
Est. average glucose Bld gHb Est-mCnc: 197 mg/dL
Hgb A1c MFr Bld: 8.5 % — ABNORMAL HIGH (ref 4.8–5.6)

## 2023-01-04 MED ORDER — FENOFIBRATE 48 MG PO TABS: 48.0000 mg | ORAL_TABLET | Freq: Every day | ORAL | 1 refills | Status: DC

## 2023-01-04 NOTE — Telephone Encounter (Signed)
-----   Message from Nurse Mackey Birchwood F sent at 01/04/2023  4:23 PM EDT ----- Patient returned our call.  Shared provider's note. Pt would like provider to know that he did have breakfast yesterday morning before lab draw.  Pt will p/u new medication and start taking it.

## 2023-01-21 ENCOUNTER — Other Ambulatory Visit: Payer: Self-pay | Admitting: Family Medicine

## 2023-01-21 DIAGNOSIS — E119 Type 2 diabetes mellitus without complications: Secondary | ICD-10-CM

## 2023-01-21 NOTE — Telephone Encounter (Signed)
Requested medication (s) are due for refill today: Yes  Requested medication (s) are on the active medication list: Yes  Last refill:  11/08/22 #180, 0RF  Future visit scheduled: Yes  Notes to clinic:  Unable to refill per protocol due to failed labs, no updated results.      Requested Prescriptions  Pending Prescriptions Disp Refills   metFORMIN (GLUCOPHAGE) 1000 MG tablet [Pharmacy Med Name: METFORMIN HCL 1,000 MG TABLET] 180 tablet 0    Sig: TAKE 1 TABLET (1,000 MG TOTAL) BY MOUTH TWICE A DAY WITH FOOD     Endocrinology:  Diabetes - Biguanides Failed - 01/21/2023  9:15 AM      Failed - HBA1C is between 0 and 7.9 and within 180 days    Hgb A1c MFr Bld  Date Value Ref Range Status  01/03/2023 8.5 (H) 4.8 - 5.6 % Final    Comment:             Prediabetes: 5.7 - 6.4          Diabetes: >6.4          Glycemic control for adults with diabetes: <7.0          Failed - B12 Level in normal range and within 720 days    No results found for: "VITAMINB12"       Failed - CBC within normal limits and completed in the last 12 months    WBC  Date Value Ref Range Status  11/25/2021 7.5 3.4 - 10.8 x10E3/uL Final  11/28/2019 12.7 (H) 4.0 - 10.5 K/uL Final   RBC  Date Value Ref Range Status  11/25/2021 5.59 4.14 - 5.80 x10E6/uL Final  11/28/2019 3.96 (L) 4.22 - 5.81 MIL/uL Final   Hemoglobin  Date Value Ref Range Status  11/25/2021 16.3 13.0 - 17.7 g/dL Final   Hematocrit  Date Value Ref Range Status  11/25/2021 48.8 37.5 - 51.0 % Final   MCHC  Date Value Ref Range Status  11/25/2021 33.4 31.5 - 35.7 g/dL Final  82/95/6213 08.6 30.0 - 36.0 g/dL Final   [facility]  Date Value Ref Range Status  11/25/2021 29.2 26.6 - 33.0 pg Final  11/28/2019 30.8 26.0 - 34.0 pg Final   MCV  Date Value Ref Range Status  11/25/2021 87 79 - 97 fL Final   No results found for: "PLTCOUNTKUC", "LABPLAT", "POCPLA" RDW  Date Value Ref Range Status  11/25/2021 14.5 11.6 - 15.4 % Final         Passed  - Cr in normal range and within 360 days    Creat  Date Value Ref Range Status  05/23/2017 0.77 0.70 - 1.25 mg/dL Final    Comment:    For patients >50 years of age, the reference limit for Creatinine is approximately 13% higher for people identified as African-American. .    Creatinine, Ser  Date Value Ref Range Status  01/03/2023 0.92 0.76 - 1.27 mg/dL Final         Passed - eGFR in normal range and within 360 days    GFR, Est African American  Date Value Ref Range Status  05/23/2017 114 > OR = 60 mL/min/1.45m2 Final   GFR calc Af Amer  Date Value Ref Range Status  12/18/2019 96 >59 mL/min/1.73 Final    Comment:    **[facility] currently reports eGFR in compliance with the current**   recommendations of the [employer]. [facility] will   update reporting as new guidelines are published from  the NKF-ASN   Task force.    GFR, Est Non African American  Date Value Ref Range Status  05/23/2017 99 > OR = 60 mL/min/1.41m2 Final   GFR calc non Af Amer  Date Value Ref Range Status  12/18/2019 83 >59 mL/min/1.73 Final   eGFR  Date Value Ref Range Status  01/03/2023 92 >59 mL/min/1.73 Final         Passed - Valid encounter within last 6 months    Recent Outpatient Visits           2 weeks ago Type 2 diabetes mellitus without complication, without long-term current use of insulin (HCC)   [place] [facility] Simmons-Robinson, [place], MD   5 months ago Type 2 diabetes mellitus with other specified complication, unspecified whether long term insulin use (HCC)   [place] [facility] Simmons-Robinson, [place], MD   8 months ago Type 2 diabetes mellitus without complication, without long-term current use of insulin (HCC)   [place] [facility] Simmons-Robinson, [place], MD   9 months ago Type 2 diabetes mellitus with other specified complication, without long-term current use of insulin (HCC)   [place]  [facility] Simmons-Robinson, [place], MD   11 months ago COVID   [place] [facility] Bosie Clos, MD       Future Appointments             In 2 months Simmons-Robinson, Tawanna [place], MD [facility]

## 2023-03-28 ENCOUNTER — Other Ambulatory Visit: Payer: Self-pay | Admitting: Family Medicine

## 2023-03-28 DIAGNOSIS — E119 Type 2 diabetes mellitus without complications: Secondary | ICD-10-CM

## 2023-03-28 DIAGNOSIS — E785 Hyperlipidemia, unspecified: Secondary | ICD-10-CM

## 2023-03-28 DIAGNOSIS — E1169 Type 2 diabetes mellitus with other specified complication: Secondary | ICD-10-CM

## 2023-04-04 LAB — HM DIABETES EYE EXAM

## 2023-04-06 ENCOUNTER — Encounter: Payer: Self-pay | Admitting: Family Medicine

## 2023-04-06 ENCOUNTER — Ambulatory Visit: Payer: Managed Care, Other (non HMO) | Admitting: Family Medicine

## 2023-04-06 VITALS — BP 105/54 | HR 77 | Temp 97.7°F | Ht 71.0 in | Wt 240.1 lb

## 2023-04-06 DIAGNOSIS — I350 Nonrheumatic aortic (valve) stenosis: Secondary | ICD-10-CM

## 2023-04-06 DIAGNOSIS — E785 Hyperlipidemia, unspecified: Secondary | ICD-10-CM

## 2023-04-06 DIAGNOSIS — M1A9XX0 Chronic gout, unspecified, without tophus (tophi): Secondary | ICD-10-CM

## 2023-04-06 DIAGNOSIS — N401 Enlarged prostate with lower urinary tract symptoms: Secondary | ICD-10-CM

## 2023-04-06 DIAGNOSIS — R350 Frequency of micturition: Secondary | ICD-10-CM

## 2023-04-06 DIAGNOSIS — I10 Essential (primary) hypertension: Secondary | ICD-10-CM | POA: Diagnosis not present

## 2023-04-06 DIAGNOSIS — E119 Type 2 diabetes mellitus without complications: Secondary | ICD-10-CM

## 2023-04-06 DIAGNOSIS — Z7985 Long-term (current) use of injectable non-insulin antidiabetic drugs: Secondary | ICD-10-CM

## 2023-04-06 DIAGNOSIS — E1169 Type 2 diabetes mellitus with other specified complication: Secondary | ICD-10-CM | POA: Diagnosis not present

## 2023-04-06 LAB — POCT GLYCOSYLATED HEMOGLOBIN (HGB A1C)
Est. average glucose Bld gHb Est-mCnc: 177
Hemoglobin A1C: 7.8 % — AB (ref 4.0–5.6)

## 2023-04-06 MED ORDER — SEMAGLUTIDE (1 MG/DOSE) 4 MG/3ML ~~LOC~~ SOPN
1.0000 mg | PEN_INJECTOR | SUBCUTANEOUS | 2 refills | Status: DC
Start: 2023-04-06 — End: 2023-05-30

## 2023-04-06 MED ORDER — EMPAGLIFLOZIN 25 MG PO TABS
25.0000 mg | ORAL_TABLET | Freq: Every day | ORAL | 3 refills | Status: DC
Start: 2023-04-06 — End: 2024-04-09

## 2023-04-06 MED ORDER — OXYBUTYNIN CHLORIDE 5 MG PO TABS
5.0000 mg | ORAL_TABLET | Freq: Three times a day (TID) | ORAL | 2 refills | Status: DC
Start: 2023-04-06 — End: 2023-08-05

## 2023-04-06 MED ORDER — METFORMIN HCL 1000 MG PO TABS
ORAL_TABLET | ORAL | 3 refills | Status: DC
Start: 2023-04-06 — End: 2023-07-13

## 2023-04-06 MED ORDER — LOSARTAN POTASSIUM-HCTZ 50-12.5 MG PO TABS
1.0000 | ORAL_TABLET | Freq: Every day | ORAL | 3 refills | Status: DC
Start: 2023-04-06 — End: 2023-12-29

## 2023-04-06 NOTE — Assessment & Plan Note (Signed)
Chronic  Stable  Pt had bioprosthetic valve replacement in 2021 Followed by cardiology, Dr. Mariah Milling

## 2023-04-06 NOTE — Progress Notes (Unsigned)
Established patient visit   Patient: Jeffery West   DOB: [birth year]   67 y.o. Male  MRN: [id number]  Visit Date: 04/06/2023  Today's healthcare provider: Ronnald Ramp, MD   Chief Complaint  Patient presents with   Follow-up   Subjective       Discussed the use of AI scribe software for clinical note transcription with the patient, who gave verbal consent to proceed.  History of Present Illness   The patient, with a history of hypertension, diabetes, and heart valve disease, presents for a routine follow-up. The patient reports feeling tired, particularly in the evenings, but denies any dizziness or lightheadedness. The fatigue is not constant and seems to be more noticeable when the patient is not actively engaged in tasks. There is no reported difficulty staying awake while driving or performing other activities.  The patient is currently on metoprolol, losartan, Crestor, and Ozempic for his chronic conditions. The patient describes the metoprolol as helping his heart valve not to work as hard, and the losartan as working at the kidneys to manage blood pressure.  The patient also reports constipation, going two to three days without a bowel movement, and describes the stool as hard when it does pass. He has not been taking any over-the-counter remedies for this issue. The patient also reports frequent urination, often feeling the urge to go and experiencing some leakage if he waits too long. He has had to get up a couple of times during the night to urinate. The patient had tried Flomax in the past but did not find it helpful.  The patient also mentions a recent foot surgery and a systolic murmur related to his heart valve condition. He has been managing dry skin on his feet with various creams. The patient is up-to-date on his vaccinations, including tetanus, pneumonia, shingles, and COVID-19, and plans to get a flu shot at a local pharmacy.         Past Medical  History:  Diagnosis Date   Coronary artery disease    Diabetes mellitus without complication (HCC)    Erectile dysfunction    Gout    Heart murmur    Hyperlipidemia    Hypertension    Obesity    Osteoarthritis    Vitamin D deficiency     Medications: Outpatient Medications Prior to Visit  Medication Sig   acetaminophen (TYLENOL) 500 MG tablet Take 1,000 mg by mouth every 6 (six) hours as needed for moderate pain or headache.   albuterol (VENTOLIN HFA) 108 (90 Base) MCG/ACT inhaler INHALE 1 PUFF INTO THE LUNGS EVERY 6 HOURS AS NEEDED FOR WHEEZING OR SHORTNESS OF BREATH.   allopurinol (ZYLOPRIM) 300 MG tablet TAKE 1 TABLET BY MOUTH EVERY DAY   aspirin EC 81 MG tablet Take 81 mg by mouth daily. Swallow whole.   BD PEN NEEDLE NANO 2ND GEN 32G X 4 MM MISC USE AS DIRECTED   Cholecalciferol (VITAMIN D) 2000 units CAPS Take 2,000 Units by mouth every evening.    cyclobenzaprine (FLEXERIL) 5 MG tablet Take 5 mg by mouth at bedtime.   ezetimibe (ZETIA) 10 MG tablet TAKE 1 TABLET BY MOUTH EVERY DAY   fenofibrate (TRICOR) 48 MG tablet Take 1 tablet (48 mg total) by mouth daily.   glucose blood (ONETOUCH VERIO) test strip Use as instructed; BID   icosapent Ethyl (VASCEPA) 1 g capsule TAKE 2 CAPSULES BY MOUTH 2 TIMES DAILY.   insulin degludec (TRESIBA FLEXTOUCH) 100 UNIT/ML FlexTouch  Pen INJECT 30 UNITS SUBCUTANEOUSLY ONCE DAILY AT 10:00PM   magnesium oxide (MAG-OX) 400 (240 Mg) MG tablet TAKE 1 TABLET BY MOUTH TWICE A DAY   metoprolol tartrate (LOPRESSOR) 25 MG tablet Take 1 tablet (25 mg total) by mouth 2 (two) times daily.   nystatin cream (MYCOSTATIN) APPLY TO AFFECTED AREA TWICE A DAY   OneTouch Delica Lancets 30G MISC Use 3 times a day   rosuvastatin (CRESTOR) 5 MG tablet Take 1 tablet (5 mg total) by mouth daily.   sildenafil (REVATIO) 20 MG tablet Take 1 tablet (20 mg total) by mouth 3 (three) times daily as needed.   traMADol (ULTRAM) 50 MG tablet Take 50 mg by mouth every 6 (six) hours  as needed.   [DISCONTINUED] JARDIANCE 25 MG TABS tablet TAKE 1 TABLET BY MOUTH EVERY DAY BEFORE BREAKFAST   [DISCONTINUED] losartan (COZAAR) 50 MG tablet TAKE 1 TABLET BY MOUTH EVERY DAY   [DISCONTINUED] metFORMIN (GLUCOPHAGE) 1000 MG tablet TAKE 1 TABLET (1,000 MG TOTAL) BY MOUTH TWICE A DAY WITH FOOD   [DISCONTINUED] Semaglutide,0.25 or 0.5MG /DOS, (OZEMPIC, 0.25 OR 0.5 MG/DOSE,) 2 MG/3ML SOPN Inject 0.5 mg into the skin once a week.   No facility-administered medications prior to visit.    Review of Systems  Last metabolic panel Lab Results  Component Value Date   GLUCOSE 187 (H) 01/03/2023   NA 138 01/03/2023   K 4.8 01/03/2023   CL 99 01/03/2023   CO2 22 01/03/2023   BUN 23 01/03/2023   CREATININE 0.92 01/03/2023   EGFR 92 01/03/2023   CALCIUM 9.7 01/03/2023   PHOS 3.9 02/24/2017   PROT 7.1 01/03/2023   ALBUMIN 4.5 01/03/2023   LABGLOB 2.6 01/03/2023   AGRATIO 1.8 11/25/2021   BILITOT 0.7 01/03/2023   ALKPHOS 80 01/03/2023   AST 23 01/03/2023   ALT 25 01/03/2023   ANIONGAP 7 11/28/2019   Last lipids Lab Results  Component Value Date   CHOL 135 01/03/2023   HDL 32 (L) 01/03/2023   LDLCALC 57 01/03/2023   TRIG 293 (H) 01/03/2023   CHOLHDL 4.2 01/03/2023   Last hemoglobin A1c Lab Results  Component Value Date   HGBA1C 7.8 (A) 04/06/2023        Objective    BP (!) 105/54 (BP Location: Left Arm, Patient Position: Sitting, Cuff Size: Normal)   Pulse 77   Temp 97.7 F (36.5 C) (Oral)   Ht 5\' 11"  (1.803 m)   Wt 240 lb 1.6 oz (108.9 kg)   SpO2 96%   BMI 33.49 kg/m   BP Readings from Last 3 Encounters:  04/06/23 (!) 105/54  01/03/23 110/60  12/27/22 (!) 110/54   Wt Readings from Last 3 Encounters:  04/06/23 240 lb 1.6 oz (108.9 kg)  01/03/23 239 lb 6.4 oz (108.6 kg)  12/27/22 243 lb 6 oz (110.4 kg)       Physical Exam Vitals reviewed.  Constitutional:      General: He is not in acute distress.    Appearance: Normal appearance. He is not  ill-appearing, toxic-appearing or diaphoretic.  Eyes:     Conjunctiva/sclera: Conjunctivae normal.  Cardiovascular:     Rate and Rhythm: Normal rate and regular rhythm.     Pulses: Normal pulses.          Dorsalis pedis pulses are 2+ on the right side and 2+ on the left side.       Posterior tibial pulses are 2+ on the right side and 2+ on the  left side.     Heart sounds: Murmur heard.     No friction rub. No gallop.  Pulmonary:     Effort: Pulmonary effort is normal. No respiratory distress.     Breath sounds: Normal breath sounds. No stridor. No wheezing, rhonchi or rales.  Abdominal:     General: Bowel sounds are normal. There is no distension.     Palpations: Abdomen is soft.     Tenderness: There is no abdominal tenderness.  Musculoskeletal:     Right lower leg: No edema.     Left lower leg: No edema.     Right foot: Normal range of motion. No deformity or bunion.     Left foot: Normal range of motion. No deformity or bunion.  Feet:     Right foot:     Protective Sensation: 6 sites tested.  6 sites sensed.     Skin integrity: Dry skin present. No skin breakdown or erythema.     Toenail Condition: Right toenails are normal.     Left foot:     Protective Sensation: 6 sites tested.  6 sites sensed.     Skin integrity: No skin breakdown, erythema or dry skin.     Toenail Condition: Left toenails are normal.  Skin:    Findings: No erythema or rash.  Neurological:     Mental Status: He is alert and oriented to person, place, and time.        Results for orders placed or performed in visit on 04/06/23  POCT glycosylated hemoglobin (Hb A1C)  Result Value Ref Range   Hemoglobin A1C 7.8 (A) 4.0 - 5.6 %   Est. average glucose Bld gHb Est-mCnc 177   Results for orders placed or performed in visit on 04/06/23  HM DIABETES EYE EXAM  Result Value Ref Range   HM Diabetic Eye Exam No Retinopathy No Retinopathy    Assessment & Plan     Problem List Items Addressed This Visit      Aortic valve stenosis    Chronic  Stable  Pt had bioprosthetic valve replacement in 2021 Followed by cardiology, Dr. Mariah Milling        Relevant Medications   losartan-hydrochlorothiazide (HYZAAR) 50-12.5 MG tablet   Benign prostatic hyperplasia with urinary frequency    Patient reports frequent urination and occasional incontinence. Discussed potential causes including overactive bladder and prostate enlargement. Previously tried flomax, states he did not like the effects of this agent.  -Start Oxybutynin for potential overactive bladder. -Order PSA and urine studies to evaluate for prostate enlargement and other potential causes. -will recommend urology referral if no improvement in symptoms       Relevant Medications   oxybutynin (DITROPAN) 5 MG tablet   Other Relevant Orders   PSA Total (Reflex To Free)   Diabetes mellitus (HCC) - Primary    A1c improved from 8.5 to 7.8. POCT completed today. Currently on Ozempic 0.5mg  weekly. Chronic  -Increase Ozempic to 1mg  weekly. -Check A1c in 3 months. -continue tresiba 30units daily  -continue metformin 1000mg  twice daily & jardiance 25mg  daily  -pt on ACE and ARB  -DM foot exam completed today        Relevant Medications   Semaglutide, 1 MG/DOSE, 4 MG/3ML SOPN   losartan-hydrochlorothiazide (HYZAAR) 50-12.5 MG tablet   metFORMIN (GLUCOPHAGE) 1000 MG tablet   empagliflozin (JARDIANCE) 25 MG TABS tablet   Other Relevant Orders   Urine microalbumin-creatinine with uACR   POCT glycosylated hemoglobin (Hb A1C) (Completed)  Gout   HLD (hyperlipidemia)   Relevant Medications   losartan-hydrochlorothiazide (HYZAAR) 50-12.5 MG tablet   HTN (hypertension)    Blood pressure well controlled, but patient reports fatigue and drowsiness. Discussed the potential side effects of Metoprolol and Losartan. -Reduce Losartan-Hydrochlorothiazide to 50mg /12.5mg  daily due to hypotension today  -Monitor blood pressure to ensure it remains within  target range.      Relevant Medications   losartan-hydrochlorothiazide (HYZAAR) 50-12.5 MG tablet   Other Relevant Orders   Comprehensive metabolic panel   Other Visit Diagnoses     Controlled type 2 diabetes mellitus without complication, without long-term current use of insulin (HCC)       Relevant Medications   Semaglutide, 1 MG/DOSE, 4 MG/3ML SOPN   losartan-hydrochlorothiazide (HYZAAR) 50-12.5 MG tablet   metFORMIN (GLUCOPHAGE) 1000 MG tablet   empagliflozin (JARDIANCE) 25 MG TABS tablet        Constipation Patient reports infrequent bowel movements and hard stools. -Recommend over-the-counter Dulcolax or Miralax as needed. As well as increased water intake   General Health Maintenance / Followup Plans -Order annual kidney urine test. -Flu vaccine due, patient plans to receive at local pharmacy. -Follow-up appointment in January 2025.        Return in about 3 months (around 07/07/2023) for DM, HTN.         Ronnald Ramp, MD  [facility] [phone] (phone) [phone] (fax)  [facility]

## 2023-04-07 NOTE — Assessment & Plan Note (Signed)
Blood pressure well controlled, but patient reports fatigue and drowsiness. Discussed the potential side effects of Metoprolol and Losartan. -Reduce Losartan-Hydrochlorothiazide to 50mg /12.5mg  daily due to hypotension today  -Monitor blood pressure to ensure it remains within target range.

## 2023-04-07 NOTE — Assessment & Plan Note (Signed)
Patient reports frequent urination and occasional incontinence. Discussed potential causes including overactive bladder and prostate enlargement. Previously tried flomax, states he did not like the effects of this agent.  -Start Oxybutynin for potential overactive bladder. -Order PSA and urine studies to evaluate for prostate enlargement and other potential causes. -will recommend urology referral if no improvement in symptoms

## 2023-04-07 NOTE — Assessment & Plan Note (Signed)
A1c improved from 8.5 to 7.8. POCT completed today. Currently on Ozempic 0.5mg  weekly. Chronic  -Increase Ozempic to 1mg  weekly. -Check A1c in 3 months. -continue tresiba 30units daily  -continue metformin 1000mg  twice daily & jardiance 25mg  daily  -pt on ACE and ARB  -DM foot exam completed today

## 2023-04-12 LAB — PSA TOTAL (REFLEX TO FREE): Prostate Specific Ag, Serum: 2 ng/mL (ref 0.0–4.0)

## 2023-04-12 LAB — MICROALBUMIN / CREATININE URINE RATIO
Creatinine, Urine: 57.9 mg/dL
Microalb/Creat Ratio: <5 mg/g{creat} (ref 0–29)
Microalbumin, Urine: <3 ug/mL

## 2023-04-12 LAB — COMPREHENSIVE METABOLIC PANEL
ALT: 15 [IU]/L (ref 0–44)
AST: 19 [IU]/L (ref 0–40)
Albumin: 4.5 g/dL (ref 3.9–4.9)
Alkaline Phosphatase: 81 [IU]/L (ref 44–121)
BUN/Creatinine Ratio: 16 (ref 10–24)
BUN: 16 mg/dL (ref 8–27)
Bilirubin Total: 0.4 mg/dL (ref 0.0–1.2)
CO2: 24 mmol/L (ref 20–29)
Calcium: 10.2 mg/dL (ref 8.6–10.2)
Chloride: 101 mmol/L (ref 96–106)
Creatinine, Ser: 1.03 mg/dL (ref 0.76–1.27)
Globulin, Total: 2.4 g/dL (ref 1.5–4.5)
Glucose: 210 mg/dL — ABNORMAL HIGH (ref 70–99)
Potassium: 4.5 mmol/L (ref 3.5–5.2)
Sodium: 139 mmol/L (ref 134–144)
Total Protein: 6.9 g/dL (ref 6.0–8.5)
eGFR: 80 mL/min/{1.73_m2} (ref >59)

## 2023-04-18 ENCOUNTER — Telehealth: Payer: Self-pay

## 2023-04-18 NOTE — Telephone Encounter (Signed)
Pt given lab results per notes of Dr. Roxan Hockey on 04/13/23. Pt verbalized understanding.   Jeffery Ramp, MD 04/13/2023  9:57 AM EDT Back to Top    Urine albumin normal

## 2023-04-22 ENCOUNTER — Other Ambulatory Visit: Payer: Self-pay | Admitting: Cardiovascular Disease

## 2023-05-06 ENCOUNTER — Other Ambulatory Visit: Payer: Self-pay | Admitting: Family Medicine

## 2023-05-06 DIAGNOSIS — E1169 Type 2 diabetes mellitus with other specified complication: Secondary | ICD-10-CM

## 2023-05-09 NOTE — Telephone Encounter (Signed)
Reordered 12/21/22 9 ml 3 RF  Requested Prescriptions  Refused Prescriptions Disp Refills   TRESIBA FLEXTOUCH 100 UNIT/ML FlexTouch Pen [Pharmacy Med Name: TRESIBA FLEXTOUCH 100 UNIT/ML SUBQ] 9 mL 3    Sig: INJECT 30 UNITS SUBCUTANEOUSLY ONCE DAILY AT 10:00PM     Endocrinology:  Diabetes - Insulins Passed - 05/06/2023  1:47 PM      Passed - HBA1C is between 0 and 7.9 and within 180 days    Hemoglobin A1C  Date Value Ref Range Status  04/06/2023 7.8 (A) 4.0 - 5.6 % Final   Hgb A1c MFr Bld  Date Value Ref Range Status  01/03/2023 8.5 (H) 4.8 - 5.6 % Final    Comment:             Prediabetes: 5.7 - 6.4          Diabetes: >6.4          Glycemic control for adults with diabetes: <7.0          Passed - Valid encounter within last 6 months    Recent Outpatient Visits           1 month ago Type 2 diabetes mellitus without complication, without long-term current use of insulin (HCC)   [facility] Simmons-Robinson, [place], MD   4 months ago Type 2 diabetes mellitus without complication, without long-term current use of insulin (HCC)   [facility] Simmons-Robinson, [place], MD   8 months ago Type 2 diabetes mellitus with other specified complication, unspecified whether long term insulin use (HCC)   [place] Simmons-Robinson, [place], MD   1 year ago Type 2 diabetes mellitus without complication, without long-term current use of insulin (HCC)   [facility] Simmons-Robinson, [place], MD   1 year ago Type 2 diabetes mellitus with other specified complication, without long-term current use of insulin (HCC)    [facility] Simmons-Robinson, [place], MD       Future Appointments             In 2 months Simmons-Robinson, Tawanna Cooler, MD [facility]

## 2023-05-26 ENCOUNTER — Other Ambulatory Visit: Payer: Self-pay | Admitting: Family Medicine

## 2023-05-26 DIAGNOSIS — E119 Type 2 diabetes mellitus without complications: Secondary | ICD-10-CM

## 2023-05-26 NOTE — Telephone Encounter (Signed)
Medication Refill -  Most Recent Primary Care Visit:  Provider: Ronnald Ramp  Department: [facility]  Visit Type: OFFICE VISIT  Date: 04/06/2023  Medication: nystatin cream (MYCOSTATIN) [[id number]]   BD PEN NEEDLE NANO 2ND GEN 32G X 4 MM MISC [[id number]]   Has the patient contacted their pharmacy? Yes (Agent: If no, request that the patient contact the pharmacy for the refill. If patient does not wish to contact the pharmacy document the reason why and proceed with request.) (Agent: If yes, when and what did the pharmacy advise?)  Is this the correct pharmacy for this prescription? Yes If no, delete pharmacy and type the correct one.  This is the patient's preferred pharmacy:  [id number] [place], [place] - [address] [place] [address] Phone: [phone] Fax: [phone]     Has the prescription been filled recently? NO   Is the patient out of the medication? Yes  Has the patient been seen for an appointment in the last year OR does the patient have an upcoming appointment? Yes  Can we respond through MyChart? No  Agent: Please be advised that Rx refills may take up to 3 business days. We ask that you follow-up with your pharmacy.

## 2023-05-27 MED ORDER — BD PEN NEEDLE NANO 2ND GEN 32G X 4 MM MISC: 2 refills | Status: DC

## 2023-05-27 MED ORDER — NYSTATIN 100000 UNIT/GM EX CREA: TOPICAL_CREAM | CUTANEOUS | 2 refills | Status: AC

## 2023-05-27 NOTE — Telephone Encounter (Signed)
Requested Prescriptions  Pending Prescriptions Disp Refills   Insulin Pen Needle (BD PEN NEEDLE NANO 2ND GEN) 32G X 4 MM MISC 100 each 8    Sig: See admin instructions.     Endocrinology: Diabetes - Testing Supplies Passed - 05/26/2023  5:38 PM      Passed - Valid encounter within last 12 months    Recent Outpatient Visits           1 month ago Type 2 diabetes mellitus without complication, without long-term current use of insulin (HCC)   [place] [facility] Simmons-Robinson, [place], MD   4 months ago Type 2 diabetes mellitus without complication, without long-term current use of insulin (HCC)   [facility] Simmons-Robinson, [place], MD   9 months ago Type 2 diabetes mellitus with other specified complication, unspecified whether long term insulin use (HCC)   [place] [facility] Simmons-Robinson, [place], MD   1 year ago Type 2 diabetes mellitus without complication, without long-term current use of insulin (HCC)   [facility] Simmons-Robinson, [place], MD   1 year ago Type 2 diabetes mellitus with other specified complication, without long-term current use of insulin (HCC)   [place] [facility] Simmons-Robinson, [place], MD       Future Appointments             In 1 month Simmons-Robinson, Makiera, MD [facility]             nystatin cream (MYCOSTATIN) 30 g 6    Sig: APPLY TO AFFECTED AREA TWICE A DAY     Off-Protocol Failed - 05/26/2023  5:38 PM      Failed - Medication not assigned to a protocol, review manually.      Passed - Valid encounter within last 12 months    Recent Outpatient Visits           1 month ago Type 2 diabetes mellitus without complication, without long-term current use of insulin (HCC)   [place] [facility] Simmons-Robinson, [place], MD   4 months ago Type 2 diabetes mellitus without  complication, without long-term current use of insulin (HCC)   [place] [facility] Simmons-Robinson, [place], MD   9 months ago Type 2 diabetes mellitus with other specified complication, unspecified whether long term insulin use (HCC)   [place] [facility] Simmons-Robinson, [place], MD   1 year ago Type 2 diabetes mellitus without complication, without long-term current use of insulin (HCC)   [place] [facility] Simmons-Robinson, [place], MD   1 year ago Type 2 diabetes mellitus with other specified complication, without long-term current use of insulin (HCC)    [facility] Simmons-Robinson, [place], MD       Future Appointments             In 1 month Simmons-Robinson, Tawanna Cooler, MD [facility], [facility]

## 2023-05-30 ENCOUNTER — Other Ambulatory Visit: Payer: Self-pay | Admitting: Family Medicine

## 2023-05-30 DIAGNOSIS — E119 Type 2 diabetes mellitus without complications: Secondary | ICD-10-CM

## 2023-06-10 ENCOUNTER — Other Ambulatory Visit: Payer: Self-pay | Admitting: Family Medicine

## 2023-06-10 DIAGNOSIS — E1169 Type 2 diabetes mellitus with other specified complication: Secondary | ICD-10-CM

## 2023-06-22 ENCOUNTER — Other Ambulatory Visit: Payer: Self-pay | Admitting: Family Medicine

## 2023-07-04 ENCOUNTER — Other Ambulatory Visit: Payer: Self-pay | Admitting: Family Medicine

## 2023-07-04 ENCOUNTER — Telehealth: Payer: Self-pay | Admitting: Family Medicine

## 2023-07-04 NOTE — Telephone Encounter (Signed)
 Spoke to [facility](mandy)--stated Rx is ready to be filled and will call the pt regarding co-pay.

## 2023-07-04 NOTE — Telephone Encounter (Signed)
 Pt is calling in because he went to pick up his Jardiance  and says he only received 12 pills instead of his usual 3 month supply and wanted to know was that done on purpose or was it an accident. Pt says he has an appointment on 07/11/23. Pt is requesting a call back.

## 2023-07-08 NOTE — Telephone Encounter (Signed)
Attn: Irene Pap, CMA    Please call Pt. Plotkin regarding empagliflozin (JARDIANCE) 25 MG TABS tablet  He is concerned as to why it is going to [employer] and why did he only receive 12 tabs.  Please advise

## 2023-07-08 NOTE — Telephone Encounter (Signed)
All prescriptions refilled that day [facility]. If he would like rx to sent to another pharmacy please let us know and we will be glad to do so. Anytime we are placing orders please be sure to notify office that different pharmacies are used for certain prescriptions.

## 2023-07-08 NOTE — Telephone Encounter (Signed)
 LVMTCB. CRM created. Ok for [facility] to advise

## 2023-07-09 ENCOUNTER — Other Ambulatory Visit: Payer: Self-pay | Admitting: Family Medicine

## 2023-07-09 DIAGNOSIS — E1169 Type 2 diabetes mellitus with other specified complication: Secondary | ICD-10-CM

## 2023-07-11 ENCOUNTER — Ambulatory Visit (INDEPENDENT_AMBULATORY_CARE_PROVIDER_SITE_OTHER): Payer: Managed Care, Other (non HMO) | Admitting: Family Medicine

## 2023-07-11 ENCOUNTER — Encounter: Payer: Self-pay | Admitting: Family Medicine

## 2023-07-11 VITALS — BP 112/65 | HR 83 | Ht 71.0 in | Wt 239.0 lb

## 2023-07-11 DIAGNOSIS — Z952 Presence of prosthetic heart valve: Secondary | ICD-10-CM | POA: Diagnosis not present

## 2023-07-11 DIAGNOSIS — E1169 Type 2 diabetes mellitus with other specified complication: Secondary | ICD-10-CM | POA: Diagnosis not present

## 2023-07-11 DIAGNOSIS — I10 Essential (primary) hypertension: Secondary | ICD-10-CM

## 2023-07-11 DIAGNOSIS — I350 Nonrheumatic aortic (valve) stenosis: Secondary | ICD-10-CM

## 2023-07-11 DIAGNOSIS — E785 Hyperlipidemia, unspecified: Secondary | ICD-10-CM

## 2023-07-11 DIAGNOSIS — Z7984 Long term (current) use of oral hypoglycemic drugs: Secondary | ICD-10-CM

## 2023-07-11 DIAGNOSIS — M1A9XX0 Chronic gout, unspecified, without tophus (tophi): Secondary | ICD-10-CM

## 2023-07-11 DIAGNOSIS — E119 Type 2 diabetes mellitus without complications: Secondary | ICD-10-CM

## 2023-07-11 DIAGNOSIS — N401 Enlarged prostate with lower urinary tract symptoms: Secondary | ICD-10-CM

## 2023-07-11 DIAGNOSIS — R350 Frequency of micturition: Secondary | ICD-10-CM

## 2023-07-11 NOTE — Assessment & Plan Note (Signed)
On losartan 50 mg and hydrochlorothiazide 12.5 mg. Reports feeling tired and sometimes lightheaded, especially when getting up after sitting. Advised slow positional changes to avoid orthostatic hypotension.   - Continue losartan 50 mg   - Continue hydrochlorothiazide 12.5 mg   - Advise slow positional changes to avoid orthostatic hypotension

## 2023-07-11 NOTE — Assessment & Plan Note (Signed)
On Crestor 5 mg, Zetia 10 mg, and Tricor 48 mg for cholesterol management. History of heart attack and valve stenosis.   - Continue Crestor 5 mg   - Continue Zetia 10 mg   - Continue Tricor 48 mg

## 2023-07-11 NOTE — Assessment & Plan Note (Signed)
A1c has improved from 7.8 to an estimated level below 7, based on recent blood sugar averages of 105. Currently on Jardiance 25 mg, Ozempic 1 mg weekly, and metformin 1000 mg twice daily. Discussed prescription refill issues with Jardiance, managing medications between two pharmacies. He prefers to continue with current pharmacies and will contact insurance for clarification on costs.   - Order A1c test   - Continue Jardiance 25 mg   - Continue Ozempic 1 mg weekly   - Continue metformin 1000 mg twice daily   - Contact insurance company regarding Jardiance prescription

## 2023-07-11 NOTE — Assessment & Plan Note (Signed)
On allopurinol 300 mg, no flare in approximately 30 years. Discussed potentially discontinuing allopurinol if uric acid levels are under control. He expressed concern about discontinuing due to fear of flare-ups.   - Order uric acid test   - Continue allopurinol 300 mg until further notice

## 2023-07-11 NOTE — Progress Notes (Signed)
Established patient visit   Patient: Jeffery West   DOB: 04-04-1956   68 y.o. Male  MRN: [id number] Visit Date: 07/11/2023  Today's healthcare provider: Ronnald Ramp, MD   No chief complaint on file.  Subjective       Discussed the use of AI scribe software for clinical note transcription with the patient, who gave verbal consent to proceed.  History of Present Illness   The patient, with a history of diabetes, heart disease, and gout, presents for a routine follow-up. He reports a stable weight around 239-241 lbs, down from a peak of 269 lbs prior to heart surgery. His blood glucose levels have been averaging around 105, and he hopes this will reflect in an improved A1c from the previous 7.8.  The patient has been experiencing some urinary symptoms, including nocturia and urgency, despite being on oxybutynin. He reports that the medication is causing dry mouth and has not significantly improved his symptoms. He also mentions a history of two slipped discs and a bulging disc, for which he has been prescribed tramadol, although he has not been using it recently.  The patient has been managing his diabetes with metformin, Jardiance, and Ozempic. He reports some confusion and frustration with his medication refills, particularly with Jardiance, which he has been getting from two different pharmacies. He also mentions a change in his losartan dosage due to feelings of tiredness.  The patient's gout has been well-controlled with allopurinol, with no recent flares. He expresses some apprehension about discontinuing this medication due to fear of a gout flare. He also takes Crestor for cholesterol management, metoprolol for heart disease, and magnesium. He reports feeling tired at times and occasionally experiencing lightheadedness upon standing.         Past Medical History:  Diagnosis Date   Coronary artery disease    Diabetes mellitus without complication (HCC)     Erectile dysfunction    Gout    Heart murmur    Hyperlipidemia    Hypertension    Obesity    Osteoarthritis    Vitamin D deficiency     Medications: Outpatient Medications Prior to Visit  Medication Sig   acetaminophen (TYLENOL) 500 MG tablet Take 1,000 mg by mouth every 6 (six) hours as needed for moderate pain or headache.   albuterol (VENTOLIN HFA) 108 (90 Base) MCG/ACT inhaler INHALE 1 PUFF INTO THE LUNGS EVERY 6 HOURS AS NEEDED FOR WHEEZING OR SHORTNESS OF BREATH.   allopurinol (ZYLOPRIM) 300 MG tablet TAKE 1 TABLET BY MOUTH EVERY DAY   aspirin EC 81 MG tablet Take 81 mg by mouth daily. Swallow whole.   Cholecalciferol (VITAMIN D) 2000 units CAPS Take 2,000 Units by mouth every evening.    cyclobenzaprine (FLEXERIL) 5 MG tablet Take 5 mg by mouth at bedtime.   empagliflozin (JARDIANCE) 25 MG TABS tablet Take 1 tablet (25 mg total) by mouth daily.   ezetimibe (ZETIA) 10 MG tablet TAKE 1 TABLET BY MOUTH EVERY DAY   fenofibrate (TRICOR) 48 MG tablet TAKE 1 TABLET BY MOUTH EVERY DAY   glucose blood (ONETOUCH VERIO) test strip Use as instructed; BID   icosapent Ethyl (VASCEPA) 1 g capsule TAKE 2 CAPSULES BY MOUTH TWICE A DAY   Insulin Pen Needle (BD PEN NEEDLE NANO 2ND GEN) 32G X 4 MM MISC USE AS DIRECTED   losartan-hydrochlorothiazide (HYZAAR) 50-12.5 MG tablet Take 1 tablet by mouth daily.   magnesium oxide (MAG-OX) 400 (240 Mg) MG tablet TAKE  1 TABLET BY MOUTH TWICE A DAY   metFORMIN (GLUCOPHAGE) 1000 MG tablet TAKE 1 TABLET (1,000 MG TOTAL) BY MOUTH TWICE A DAY WITH FOOD   metoprolol tartrate (LOPRESSOR) 25 MG tablet Take 1 tablet (25 mg total) by mouth 2 (two) times daily.   nystatin cream (MYCOSTATIN) APPLY TO AFFECTED AREA TWICE A DAY   OneTouch Delica Lancets 30G MISC Use 3 times a day   oxybutynin (DITROPAN) 5 MG tablet Take 1 tablet (5 mg total) by mouth 3 (three) times daily.   OZEMPIC, 1 MG/DOSE, 4 MG/3ML SOPN INJECT 1MG  SUBCUTANEOUSLY ONCE A WEEK   rosuvastatin (CRESTOR)  5 MG tablet TAKE 1 TABLET BY MOUTH EVERY DAY   sildenafil (REVATIO) 20 MG tablet Take 1 tablet (20 mg total) by mouth 3 (three) times daily as needed.   traMADol (ULTRAM) 50 MG tablet Take 50 mg by mouth every 6 (six) hours as needed.   TRESIBA FLEXTOUCH 100 UNIT/ML FlexTouch Pen INJECT 30 UNITS SUBCUTANEOUSLY ONCE DAILY AT 10:00PM   No facility-administered medications prior to visit.    Review of Systems  Last metabolic panel Lab Results  Component Value Date   GLUCOSE 210 (H) 04/11/2023   NA 139 04/11/2023   K 4.5 04/11/2023   CL 101 04/11/2023   CO2 24 04/11/2023   BUN 16 04/11/2023   CREATININE 1.03 04/11/2023   EGFR 80 04/11/2023   CALCIUM 10.2 04/11/2023   PHOS 3.9 02/24/2017   PROT 6.9 04/11/2023   ALBUMIN 4.5 04/11/2023   LABGLOB 2.4 04/11/2023   AGRATIO 1.8 11/25/2021   BILITOT 0.4 04/11/2023   ALKPHOS 81 04/11/2023   AST 19 04/11/2023   ALT 15 04/11/2023   ANIONGAP 7 11/28/2019   Last lipids Lab Results  Component Value Date   CHOL 135 01/03/2023   HDL 32 (L) 01/03/2023   LDLCALC 57 01/03/2023   TRIG 293 (H) 01/03/2023   CHOLHDL 4.2 01/03/2023   Last hemoglobin A1c Lab Results  Component Value Date   HGBA1C 7.8 (A) 04/06/2023     No results found for: "LABURIC"     Objective    BP 112/65 (BP Location: Right Arm, Patient Position: Sitting, Cuff Size: Normal)   Pulse 83   Ht 5\' 11"  (1.803 m)   Wt 239 lb (108.4 kg)   SpO2 97%   BMI 33.33 kg/m  BP Readings from Last 3 Encounters:  07/11/23 112/65  04/06/23 (!) 105/54  01/03/23 110/60   Wt Readings from Last 3 Encounters:  07/11/23 239 lb (108.4 kg)  04/06/23 240 lb 1.6 oz (108.9 kg)  01/03/23 239 lb 6.4 oz (108.6 kg)       Physical Exam  General: Alert, no acute distress Cardio: Normal S1 and S2, RRR, no r/m/g Pulm: CTAB, normal work of breathing, Extrm: no LE edema  No results found for any visits on 07/11/23.  Assessment & Plan     Problem List Items Addressed This Visit        Cardiovascular and Mediastinum   HTN (hypertension) - Primary   On losartan 50 mg and hydrochlorothiazide 12.5 mg. Reports feeling tired and sometimes lightheaded, especially when getting up after sitting. Advised slow positional changes to avoid orthostatic hypotension.   - Continue losartan 50 mg   - Continue hydrochlorothiazide 12.5 mg   - Advise slow positional changes to avoid orthostatic hypotension        Aortic valve stenosis     Endocrine   Hyperlipidemia associated with type 2 diabetes  mellitus (HCC)   On Crestor 5 mg, Zetia 10 mg, and Tricor 48 mg for cholesterol management. History of heart attack and valve stenosis.   - Continue Crestor 5 mg   - Continue Zetia 10 mg   - Continue Tricor 48 mg        Diabetes mellitus (HCC)   A1c has improved from 7.8 to an estimated level below 7, based on recent blood sugar averages of 105. Currently on Jardiance 25 mg, Ozempic 1 mg weekly, and metformin 1000 mg twice daily. Discussed prescription refill issues with Jardiance, managing medications between two pharmacies. He prefers to continue with current pharmacies and will contact insurance for clarification on costs.   - Order A1c test   - Continue Jardiance 25 mg   - Continue Ozempic 1 mg weekly   - Continue metformin 1000 mg twice daily   - Contact insurance company regarding Jardiance prescription        Relevant Orders   Hemoglobin A1c     Other   S/P AVR   Morbid obesity (HCC)   Gout   On allopurinol 300 mg, no flare in approximately 30 years. Discussed potentially discontinuing allopurinol if uric acid levels are under control. He expressed concern about discontinuing due to fear of flare-ups.   - Order uric acid test   - Continue allopurinol 300 mg until further notice        Relevant Orders   Uric acid   Benign prostatic hyperplasia with urinary frequency   Currently on oxybutynin 5 mg, reports significant dry mouth and no improvement in symptoms. Previously  tried Flomax without success. Discussed switching to finasteride 5 mg once daily if symptoms persist.   - Discontinue oxybutynin 5 mg after current supply is finished   - Consider starting finasteride 5 mg once daily if symptoms persist              Chronic Back Pain   History of slipped and bulging discs, treated by [facility]. Leftover tramadol 50 mg from a previous prescription.   - Continue current pain management plan   - Consider referral to [facility] if pain worsens    General Health Maintenance   Due for a wellness visit in June. Currently taking multiple medications for various conditions, including aspirin 81 mg for heart health and magnesium.   - Schedule wellness visit for June   - Continue aspirin 81 mg   - Continue magnesium supplementation       Return in about 5 months (around 12/09/2023) for AWV.      Ronnald Ramp, MD  [facility] [phone] (phone) [phone] (fax)  [facility]

## 2023-07-11 NOTE — Assessment & Plan Note (Signed)
Currently on oxybutynin 5 mg, reports significant dry mouth and no improvement in symptoms. Previously tried Flomax without success. Discussed switching to finasteride 5 mg once daily if symptoms persist.   - Discontinue oxybutynin 5 mg after current supply is finished   - Consider starting finasteride 5 mg once daily if symptoms persist

## 2023-07-11 NOTE — Telephone Encounter (Signed)
Jardiance 25 mg refused because it's being requested too soon.

## 2023-07-12 LAB — HEMOGLOBIN A1C
Est. average glucose Bld gHb Est-mCnc: 194 mg/dL
Hgb A1c MFr Bld: 8.4 % — ABNORMAL HIGH (ref 4.8–5.6)

## 2023-07-12 LAB — URIC ACID: Uric Acid: 3.6 mg/dL — ABNORMAL LOW (ref 3.8–8.4)

## 2023-07-13 ENCOUNTER — Other Ambulatory Visit: Payer: Self-pay | Admitting: Family Medicine

## 2023-07-13 DIAGNOSIS — E119 Type 2 diabetes mellitus without complications: Secondary | ICD-10-CM

## 2023-07-13 MED ORDER — SEMAGLUTIDE (2 MG/DOSE) 8 MG/3ML ~~LOC~~ SOPN: 2.0000 mg | PEN_INJECTOR | SUBCUTANEOUS | 3 refills | Status: DC

## 2023-07-13 NOTE — Telephone Encounter (Signed)
Please advise 

## 2023-07-13 NOTE — Telephone Encounter (Signed)
Ozempic updated to 2mg  weekly and sent to pharmacy for treatment of diabetes.   Ronnald Ramp, MD

## 2023-07-29 ENCOUNTER — Telehealth: Payer: Self-pay

## 2023-07-29 DIAGNOSIS — E785 Hyperlipidemia, unspecified: Secondary | ICD-10-CM

## 2023-07-29 NOTE — Telephone Encounter (Signed)
Copied from CRM [id number]. Topic: General - Other >> Jul 29, 2023  1:49 PM Dondra Prader E wrote: Reason for CRM: Pt called to report that he only received 13 pills in his Rx refill for ezetimibe (ZETIA) 10 MG tablet. Pt says he was supposed to have received 90.

## 2023-08-04 ENCOUNTER — Other Ambulatory Visit: Payer: Self-pay | Admitting: Family Medicine

## 2023-08-04 DIAGNOSIS — N401 Enlarged prostate with lower urinary tract symptoms: Secondary | ICD-10-CM

## 2023-08-04 MED ORDER — EZETIMIBE 10 MG PO TABS: 10.0000 mg | ORAL_TABLET | Freq: Every day | ORAL | 3 refills | Status: DC

## 2023-08-04 NOTE — Telephone Encounter (Signed)
Spoke with Lequita Asal patient's wife and explained that it looks like the pharmacy put a qty limit.

## 2023-08-05 NOTE — Telephone Encounter (Signed)
Requested Prescriptions  Pending Prescriptions Disp Refills   oxybutynin (DITROPAN) 5 MG tablet [Pharmacy Med Name: OXYBUTYNIN CHLORIDE 5 MG TAB] 270 tablet 0    Sig: TAKE 1 TABLET BY MOUTH 3 TIMES DAILY     Urology:  Bladder Agents Passed - 08/05/2023  2:15 PM      Passed - Valid encounter within last 12 months    Recent Outpatient Visits           3 weeks ago Primary hypertension   [place] [facility] Simmons-Robinson, [place], MD   4 months ago Type 2 diabetes mellitus without complication, without long-term current use of insulin (HCC)   [place] [facility] Simmons-Robinson, [place], MD   7 months ago Type 2 diabetes mellitus without complication, without long-term current use of insulin (HCC)   [place] [facility] Simmons-Robinson, [place], MD   11 months ago Type 2 diabetes mellitus with other specified complication, unspecified whether long term insulin use (HCC)   [place] [facility] Simmons-Robinson, [place], MD   1 year ago Type 2 diabetes mellitus without complication, without long-term current use of insulin (HCC)   [place] [facility] [place], Tawanna Cooler, MD

## 2023-09-12 ENCOUNTER — Other Ambulatory Visit: Payer: Self-pay | Admitting: Family Medicine

## 2023-09-13 NOTE — Telephone Encounter (Signed)
 Requested Prescriptions  Refused Prescriptions Disp Refills   OZEMPIC, 1 MG/DOSE, 4 MG/3ML SOPN [Pharmacy Med Name: OZEMPIC (1 MG/DOSE) 4 MG/3ML SUBQ S] 3 mL     Sig: INJECT 1MG  SUBCUTANEOUSLY ONCE A WEEK     Endocrinology:  Diabetes - GLP-1 Receptor Agonists - semaglutide Failed - 09/13/2023  4:52 PM      Failed - HBA1C in normal range and within 180 days    Hgb A1c MFr Bld  Date Value Ref Range Status  07/11/2023 8.4 (H) 4.8 - 5.6 % Final    Comment:             Prediabetes: 5.7 - 6.4          Diabetes: >6.4          Glycemic control for adults with diabetes: <7.0          Passed - Cr in normal range and within 360 days    Creat  Date Value Ref Range Status  05/23/2017 0.77 0.70 - 1.25 mg/dL Final    Comment:    For patients >35 years of age, the reference limit for Creatinine is approximately 13% higher for people identified as African-American. .    Creatinine, Ser  Date Value Ref Range Status  04/11/2023 1.03 0.76 - 1.27 mg/dL Final         Passed - Valid encounter within last 6 months    Recent Outpatient Visits           2 months ago Primary hypertension   [place] [facility] Simmons-Robinson, [place], MD   5 months ago Type 2 diabetes mellitus without complication, without long-term current use of insulin (HCC)   [place] [facility] Simmons-Robinson, [place], MD   8 months ago Type 2 diabetes mellitus without complication, without long-term current use of insulin (HCC)   [place] [facility] Simmons-Robinson, [place], MD   1 year ago Type 2 diabetes mellitus with other specified complication, unspecified whether long term insulin use (HCC)   [place] [facility] Simmons-Robinson, [place], MD   1 year ago Type 2 diabetes mellitus without complication, without long-term current use of insulin (HCC)   [place] [facility] [place], Tawanna Cooler, MD

## 2023-09-16 ENCOUNTER — Other Ambulatory Visit: Payer: Self-pay | Admitting: Family Medicine

## 2023-09-16 DIAGNOSIS — E119 Type 2 diabetes mellitus without complications: Secondary | ICD-10-CM

## 2023-09-16 NOTE — Telephone Encounter (Signed)
 Copied from CRM [id number]. Topic: Clinical - Medication Refill >> Sep 16, 2023  1:48 PM Carlatta H wrote: Most Recent Primary Care Visit:  Provider: Ronnald Ramp  Department: [facility]  Visit Type: OFFICE VISIT  Date: 07/11/2023  Medication: Semaglutide, 2 MG/DOSE, 8 MG/3ML SOPN [[id number]]  Has the patient contacted their pharmacy? No (Agent: If no, request that the patient contact the pharmacy for the refill. If patient does not wish to contact the pharmacy document the reason why and proceed with request.) (Agent: If yes, when and what did the pharmacy advise?)  Is this the correct pharmacy for this prescription? Yes If no, delete pharmacy and type the correct one.  This is the patient's preferred pharmacy:     [facility] - [place], [place] - [address] [place] [address] Phone: [phone] Fax: [phone]   Has the prescription been filled recently? No  Is the patient out of the medication? Yes  Has the patient been seen for an appointment in the last year OR does the patient have an upcoming appointment? Yes  Can we respond through MyChart? Yes  Agent: Please be advised that Rx refills may take up to 3 business days. We ask that you follow-up with your pharmacy.

## 2023-09-19 MED ORDER — SEMAGLUTIDE (2 MG/DOSE) 8 MG/3ML ~~LOC~~ SOPN: 2.0000 mg | PEN_INJECTOR | SUBCUTANEOUS | 1 refills | Status: DC

## 2023-09-19 NOTE — Telephone Encounter (Signed)
 Last OV 07/11/23 within protocol.  Requested Prescriptions  Pending Prescriptions Disp Refills   Semaglutide, 2 MG/DOSE, 8 MG/3ML SOPN 3 mL 1    Sig: Inject 2 mg as directed once a week.     Endocrinology:  Diabetes - GLP-1 Receptor Agonists - semaglutide Failed - 09/19/2023  2:05 PM      Failed - HBA1C in normal range and within 180 days    Hgb A1c MFr Bld  Date Value Ref Range Status  07/11/2023 8.4 (H) 4.8 - 5.6 % Final    Comment:             Prediabetes: 5.7 - 6.4          Diabetes: >6.4          Glycemic control for adults with diabetes: <7.0          Failed - Valid encounter within last 6 months    Recent Outpatient Visits   None            Passed - Cr in normal range and within 360 days    Creat  Date Value Ref Range Status  05/23/2017 0.77 0.70 - 1.25 mg/dL Final    Comment:    For patients >43 years of age, the reference limit for Creatinine is approximately 13% higher for people identified as African-American. .    Creatinine, Ser  Date Value Ref Range Status  04/11/2023 1.03 0.76 - 1.27 mg/dL Final

## 2023-10-04 ENCOUNTER — Other Ambulatory Visit: Payer: Self-pay | Admitting: Family Medicine

## 2023-10-04 DIAGNOSIS — E1169 Type 2 diabetes mellitus with other specified complication: Secondary | ICD-10-CM

## 2023-10-04 NOTE — Telephone Encounter (Signed)
 LOV 1*20*25 NOV 6*30*25 LRF [id number] LABS  [id number]

## 2023-10-24 ENCOUNTER — Other Ambulatory Visit (HOSPITAL_COMMUNITY): Payer: Self-pay

## 2023-12-11 ENCOUNTER — Other Ambulatory Visit: Payer: Self-pay | Admitting: Family Medicine

## 2023-12-14 ENCOUNTER — Other Ambulatory Visit (HOSPITAL_COMMUNITY): Payer: Self-pay

## 2023-12-14 ENCOUNTER — Telehealth: Payer: Self-pay | Admitting: Pharmacy Technician

## 2023-12-14 NOTE — Telephone Encounter (Signed)
 Pharmacy Patient Advocate Encounter   Received notification from Onbase that prior authorization for Ozempic  (2 MG/DOSE) 8MG /3ML pen-injectors is required/requested.   Insurance verification completed.   The patient is insured through [employer] .   Per test claim: Refill too soon. PA is not needed at this time. Medication was filled 12/12/2023. Next eligible fill date is 12/25/2023.

## 2023-12-19 ENCOUNTER — Encounter: Payer: Self-pay | Admitting: Family Medicine

## 2023-12-19 ENCOUNTER — Ambulatory Visit (INDEPENDENT_AMBULATORY_CARE_PROVIDER_SITE_OTHER): Payer: Self-pay | Admitting: Family Medicine

## 2023-12-19 VITALS — BP 99/62 | HR 77 | Ht 71.0 in | Wt 229.0 lb

## 2023-12-19 DIAGNOSIS — E119 Type 2 diabetes mellitus without complications: Secondary | ICD-10-CM

## 2023-12-19 DIAGNOSIS — Z130 Encounter for screening for diseases of the blood and blood-forming organs and certain disorders involving the immune mechanism: Secondary | ICD-10-CM

## 2023-12-19 DIAGNOSIS — Z0001 Encounter for general adult medical examination with abnormal findings: Secondary | ICD-10-CM | POA: Diagnosis not present

## 2023-12-19 DIAGNOSIS — Z0000 Encounter for general adult medical examination without abnormal findings: Secondary | ICD-10-CM | POA: Insufficient documentation

## 2023-12-19 DIAGNOSIS — E559 Vitamin D deficiency, unspecified: Secondary | ICD-10-CM

## 2023-12-19 DIAGNOSIS — N529 Male erectile dysfunction, unspecified: Secondary | ICD-10-CM

## 2023-12-19 DIAGNOSIS — I350 Nonrheumatic aortic (valve) stenosis: Secondary | ICD-10-CM

## 2023-12-19 DIAGNOSIS — Z952 Presence of prosthetic heart valve: Secondary | ICD-10-CM

## 2023-12-19 DIAGNOSIS — E1169 Type 2 diabetes mellitus with other specified complication: Secondary | ICD-10-CM

## 2023-12-19 DIAGNOSIS — R350 Frequency of micturition: Secondary | ICD-10-CM

## 2023-12-19 DIAGNOSIS — F17200 Nicotine dependence, unspecified, uncomplicated: Secondary | ICD-10-CM

## 2023-12-19 DIAGNOSIS — M545 Low back pain, unspecified: Secondary | ICD-10-CM

## 2023-12-19 DIAGNOSIS — I10 Essential (primary) hypertension: Secondary | ICD-10-CM

## 2023-12-19 DIAGNOSIS — N401 Enlarged prostate with lower urinary tract symptoms: Secondary | ICD-10-CM

## 2023-12-19 DIAGNOSIS — M1A9XX0 Chronic gout, unspecified, without tophus (tophi): Secondary | ICD-10-CM

## 2023-12-19 MED ORDER — CYCLOBENZAPRINE HCL 5 MG PO TABS: 5.0000 mg | ORAL_TABLET | Freq: Every day | ORAL | 3 refills | Status: DC

## 2023-12-19 NOTE — Patient Instructions (Signed)
 It was a pleasure to see you today!  Thank you for choosing [facility] for your primary care.   Today you were seen for your annual physical  Please review the attached information regarding helpful preventive health topics.   To keep you healthy, please keep in mind the following health maintenance items that you are due for:   Health Maintenance Due  Topic Date Due   COVID-19 Vaccine (4 - 2024-25 season) 11/28/2023     Best Wishes,   Dr. Lang

## 2023-12-19 NOTE — Progress Notes (Signed)
 Complete physical exam   Patient: Jeffery West   DOB: [birth year]   67 y.o. Male  MRN: [id number] Visit Date: 12/19/2023  Today's healthcare provider: Rockie Agent, MD   Chief Complaint  Patient presents with   Annual Exam   Care Management    Pattern of eating:General  Sleep:normal  Are you exercising: No (gardening)  Vaccine:       Subjective    Jeffery West is a 68 y.o. male who presents today for a complete physical exam.    Discussed the use of AI scribe software for clinical note transcription with the patient, who gave verbal consent to proceed.  History of Present Illness Jeffery West is a 68 year old male who presents for an annual physical exam.  He has experienced a weight loss of 10 pounds since his last visit, which he attributes to dietary changes.  He experiences constipation, with bowel movements occurring every three days, which he associates with semaglutide  use. He is attempting to manage this by increasing his water intake.  He has noticed prominent veins on his arms and legs without associated pain, swelling, or redness.  He has a significant hernia that has been present for a long time but is currently asymptomatic.  He smokes and has a history of prostatic hyperplasia with urinary frequency.  He is requesting a refill of flexeril  for chronic back pain as a result of two slipped discs in his back. He uses this medicaiton intermittently with tramadol  to help with pain.   Past Medical History:  Diagnosis Date   Coronary artery disease    Diabetes mellitus without complication (HCC)    Erectile dysfunction    Gout    Heart murmur    Hyperlipidemia    Hypertension    Obesity    Osteoarthritis    Vitamin D deficiency    Past Surgical History:  Procedure Laterality Date   AORTIC VALVE REPLACEMENT N/A 11/26/2019   Procedure: AORTIC VALVE REPLACEMENT (AVR) USING INSPIRIS AORTIC VALVE SIZE ;  Surgeon: Lucas Dorise POUR, MD;   Location: [facility] OR;  Service: Open Heart Surgery;  Laterality: N/A;   CARDIAC CATHETERIZATION     COLONOSCOPY WITH PROPOFOL  N/A 08/24/2019   Procedure: COLONOSCOPY WITH PROPOFOL ;  Surgeon: Jinny Carmine, MD;  Location: [facility] ENDOSCOPY;  Service: Endoscopy;  Laterality: N/A;   CORONARY ARTERY BYPASS GRAFT N/A 11/26/2019   Procedure: CORONARY ARTERY BYPASS GRAFTING (CABG) X Two , using left internal mammary artery and right leg greater saphenous vein harvested endoscopically.;  Surgeon: Lucas Dorise POUR, MD;  Location: [facility] OR;  Service: Open Heart Surgery;  Laterality: N/A;   LEFT HEART CATH AND CORONARY ANGIOGRAPHY Left 10/19/2019   Procedure: LEFT HEART CATH AND CORONARY ANGIOGRAPHY;  Surgeon: Perla Evalene PARAS, MD;  Location: [facility] INVASIVE CV LAB;  Service: Cardiovascular;  Laterality: Left;   NO PAST SURGERIES     RIGHT HEART CATH N/A 10/19/2019   Procedure: RIGHT HEART CATH;  Surgeon: Perla Evalene PARAS, MD;  Location: [facility] INVASIVE CV LAB;  Service: Cardiovascular;  Laterality: N/A;   SHOULDER ARTHROSCOPY WITH OPEN ROTATOR CUFF REPAIR Right 07/26/2017   Procedure: SHOULDER ARTHROSCOPY WITH OPEN ROTATOR CUFF REPAIR,SUBACROMINAL DECOMPRESSION,DISTAL CLAVICLE EXCISION;  Surgeon: Marchia Drivers, MD;  Location: [facility] ORS;  Service: Orthopedics;  Laterality: Right;   TEE WITHOUT CARDIOVERSION N/A 11/26/2019   Procedure: TRANSESOPHAGEAL ECHOCARDIOGRAM (TEE);  Surgeon: Lucas Dorise POUR, MD;  Location: [facility] OR;  Service: Open Heart Surgery;  Laterality: N/A;  Social History   Socioeconomic History   Marital status: Married    Spouse name: Not on file   Number of children: Not on file   Years of education: Not on file   Highest education level: Not on file  Occupational History   Not on file  Tobacco Use   Smoking status: Never   Smokeless tobacco: Never  Vaping Use   Vaping status: Never Used  Substance and Sexual Activity   Alcohol use: Yes    Alcohol/week: 0.0 standard drinks of alcohol    Comment:  social   Drug use: No   Sexual activity: Not on file  Other Topics Concern   Not on file  Social History Narrative   Not on file   Social Drivers of Health   Financial Resource Strain: Not on file  Food Insecurity: Not on file  Transportation Needs: Not on file  Physical Activity: Not on file  Stress: Not on file  Social Connections: Not on file  Intimate Partner Violence: Not on file   Family Status  Relation Name Status   Mother  Alive   Father  Deceased   Sister Devere Searles   Brother  Alive  No partnership data on file   Family History  Problem Relation Age of Onset   Asthma Mother    Cancer Father        possibly in liver and lung   Hypertension Brother    Allergies  Allergen Reactions   Penicillins Hives, Swelling and Other (See Comments)    Has patient had a PCN reaction causing immediate rash, facial/tongue/throat swelling, SOB or lightheadedness with hypotension: Unknown Has patient had a PCN reaction causing severe rash involving mucus membranes or skin necrosis: No Has patient had a PCN reaction that required hospitalization: no Has patient had a PCN reaction occurring within the last 10 years: No If all of the above answers are NO, then may proceed with Cephalosporin use.      Medications: Outpatient Medications Prior to Visit  Medication Sig   acetaminophen  (TYLENOL ) 500 MG tablet Take 1,000 mg by mouth every 6 (six) hours as needed for moderate pain or headache.   albuterol  (VENTOLIN  HFA) 108 (90 Base) MCG/ACT inhaler INHALE 1 PUFF INTO THE LUNGS EVERY 6 HOURS AS NEEDED FOR WHEEZING OR SHORTNESS OF BREATH.   allopurinol  (ZYLOPRIM ) 300 MG tablet TAKE 1 TABLET BY MOUTH EVERY DAY   aspirin  EC 81 MG tablet Take 81 mg by mouth daily. Swallow whole.   Cholecalciferol (VITAMIN D) 2000 units CAPS Take 2,000 Units by mouth every evening.    cyclobenzaprine  (FLEXERIL ) 5 MG tablet Take 5 mg by mouth at bedtime.   empagliflozin  (JARDIANCE ) 25 MG TABS tablet  Take 1 tablet (25 mg total) by mouth daily.   ezetimibe  (ZETIA ) 10 MG tablet Take 1 tablet (10 mg total) by mouth daily.   fenofibrate  (TRICOR ) 48 MG tablet TAKE 1 TABLET BY MOUTH EVERY DAY   glucose blood (ONETOUCH VERIO) test strip Use as instructed; BID   icosapent  Ethyl (VASCEPA ) 1 g capsule TAKE 2 CAPSULES BY MOUTH TWICE A DAY   Insulin  Pen Needle (BD PEN NEEDLE NANO 2ND GEN) 32G X 4 MM MISC USE AS DIRECTED   losartan -hydrochlorothiazide (HYZAAR) 50-12.5 MG tablet Take 1 tablet by mouth daily.   magnesium  oxide (MAG-OX) 400 (240 Mg) MG tablet TAKE 1 TABLET BY MOUTH TWICE A DAY   metFORMIN  (GLUCOPHAGE ) 1000 MG tablet TAKE 1 TABLET (1,000 MG TOTAL) BY MOUTH  TWICE A DAY WITH FOOD   metoprolol  tartrate (LOPRESSOR ) 25 MG tablet Take 1 tablet (25 mg total) by mouth 2 (two) times daily.   nystatin  cream (MYCOSTATIN ) APPLY TO AFFECTED AREA TWICE A DAY   OneTouch Delica Lancets 30G MISC Use 3 times a day   oxybutynin  (DITROPAN ) 5 MG tablet TAKE 1 TABLET BY MOUTH 3 TIMES DAILY   rosuvastatin  (CRESTOR ) 5 MG tablet TAKE 1 TABLET BY MOUTH EVERY DAY   Semaglutide , 2 MG/DOSE, 8 MG/3ML SOPN Inject 2 mg as directed once a week.   sildenafil  (REVATIO ) 20 MG tablet Take 1 tablet (20 mg total) by mouth 3 (three) times daily as needed.   traMADol  (ULTRAM ) 50 MG tablet Take 50 mg by mouth every 6 (six) hours as needed.   TRESIBA  FLEXTOUCH 100 UNIT/ML FlexTouch Pen INJECT 30 UNITS SUBCUTANEOUSLY ONCE DAILY AT 10:00PM   No facility-administered medications prior to visit.    Review of Systems  Last CBC Lab Results  Component Value Date   WBC 7.5 11/25/2021   HGB 16.3 11/25/2021   HCT 48.8 11/25/2021   MCV 87 11/25/2021   MCH 29.2 11/25/2021   RDW 14.5 11/25/2021   PLT 152 11/25/2021   Last metabolic panel Lab Results  Component Value Date   GLUCOSE 210 (H) 04/11/2023   NA 139 04/11/2023   K 4.5 04/11/2023   CL 101 04/11/2023   CO2 24 04/11/2023   BUN 16 04/11/2023   CREATININE 1.03  04/11/2023   EGFR 80 04/11/2023   CALCIUM  10.2 04/11/2023   PHOS 3.9 02/24/2017   PROT 6.9 04/11/2023   ALBUMIN  4.5 04/11/2023   LABGLOB 2.4 04/11/2023   AGRATIO 1.8 11/25/2021   BILITOT 0.4 04/11/2023   ALKPHOS 81 04/11/2023   AST 19 04/11/2023   ALT 15 04/11/2023   ANIONGAP 7 11/28/2019   Last lipids Lab Results  Component Value Date   CHOL 135 01/03/2023   HDL 32 (L) 01/03/2023   LDLCALC 57 01/03/2023   TRIG 293 (H) 01/03/2023   CHOLHDL 4.2 01/03/2023   Last hemoglobin A1c Lab Results  Component Value Date   HGBA1C 8.4 (H) 07/11/2023   Last thyroid functions Lab Results  Component Value Date   TSH 2.140 11/25/2021   Last vitamin D No results found for: 25OHVITD2, 25OHVITD3, VD25OH Last vitamin B12 and Folate No results found for: VITAMINB12, FOLATE     Objective    BP 99/62   Pulse 77   Ht 5' 11 (1.803 m)   Wt 229 lb (103.9 kg)   SpO2 95%   BMI 31.94 kg/m  BP Readings from Last 3 Encounters:  12/19/23 99/62  07/11/23 112/65  04/06/23 (!) 105/54   Wt Readings from Last 3 Encounters:  12/19/23 229 lb (103.9 kg)  07/11/23 239 lb (108.4 kg)  04/06/23 240 lb 1.6 oz (108.9 kg)       Physical Exam Constitutional:      General: He is not in acute distress.    Appearance: Normal appearance. He is not ill-appearing.  HENT:     Mouth/Throat:     Mouth: Mucous membranes are moist.   Eyes:     General: No scleral icterus.       Right eye: No discharge.        Left eye: No discharge.     Extraocular Movements: Extraocular movements intact.     Conjunctiva/sclera: Conjunctivae normal.     Pupils: Pupils are equal, round, and reactive to light.  Cardiovascular:     Rate and Rhythm: Normal rate and regular rhythm.     Heart sounds: Murmur heard.     Systolic murmur is present with a grade of 3/6.  Pulmonary:     Effort: Pulmonary effort is normal.     Breath sounds: Normal breath sounds.  Abdominal:     General: Bowel sounds are  normal. There is no distension.     Palpations: Abdomen is soft.     Tenderness: There is no abdominal tenderness.     Hernia: A hernia is present. Hernia is present in the ventral area.   Musculoskeletal:        General: No deformity or signs of injury. Normal range of motion.     Cervical back: Normal range of motion and neck supple. No tenderness.     Right lower leg: No edema.     Left lower leg: No edema.  Lymphadenopathy:     Cervical: No cervical adenopathy.   Neurological:     Mental Status: He is alert and oriented to person, place, and time.     Cranial Nerves: No cranial nerve deficit.     Motor: No weakness.     Gait: Gait normal.   Psychiatric:        Mood and Affect: Mood normal.        Behavior: Behavior normal.       Last depression screening scores    12/19/2023    9:06 AM 04/06/2023    4:15 PM 01/03/2023   10:49 AM  PHQ 2/9 Scores  PHQ - 2 Score 0 0 0  PHQ- 9 Score 1      Last fall risk screening    04/06/2023    4:14 PM  Fall Risk   Falls in the past year? 0  Number falls in past yr: 0  Injury with Fall? 0  Follow up Falls evaluation completed    Last Audit-C alcohol use screening    08/23/2022    2:23 PM  Alcohol Use Disorder Test (AUDIT)  1. How often do you have a drink containing alcohol? 0  2. How many drinks containing alcohol do you have on a typical day when you are drinking? 0  3. How often do you have six or more drinks on one occasion? 0  AUDIT-C Score 0   A score of 3 or more in women, and 4 or more in men indicates increased risk for alcohol abuse, EXCEPT if all of the points are from question 1   No results found for any visits on 12/19/23.  Assessment & Plan    Routine Health Maintenance and Physical Exam  Immunization History  Administered Date(s) Administered   Fluad Quad(high Dose 65+) 03/31/2022   Influenza Split 07/05/2011   Influenza, High Dose Seasonal PF 04/22/2023   Influenza,inj,Quad PF,6+ Mos 05/28/2013,  04/18/2018, 04/01/2020   Influenza-Unspecified 04/29/2017, 04/21/2021   Moderna Covid-19 Fall Seasonal Vaccine 31yrs & older 05/30/2023   PFIZER(Purple Top)SARS-COV-2 Vaccination 09/19/2019, 10/10/2019   PNEUMOCOCCAL CONJUGATE-20 05/05/2022   Pneumococcal Polysaccharide-23 04/29/2017   Td 12/30/2003   Tdap 05/23/2017   Zoster Recombinant(Shingrix) 03/10/2019, 06/01/2019    Health Maintenance  Topic Date Due   COVID-19 Vaccine (4 - 2024-25 season) 11/28/2023   HEMOGLOBIN A1C  01/08/2024   INFLUENZA VACCINE  01/20/2024   OPHTHALMOLOGY EXAM  04/03/2024   FOOT EXAM  04/05/2024   Diabetic kidney evaluation - eGFR measurement  04/10/2024   Diabetic kidney evaluation - Urine  ACR  04/10/2024   Colonoscopy  08/23/2024   DTaP/Tdap/Td (3 - Td or Tdap) 05/24/2027   Pneumococcal Vaccine: 50+ Years  Completed   Hepatitis C Screening  Completed   Zoster Vaccines- Shingrix  Completed   Hepatitis B Vaccines  Aged Out   HPV VACCINES  Aged Out   Meningococcal B Vaccine  Aged Out    Problem List Items Addressed This Visit       Cardiovascular and Mediastinum   HTN (hypertension)   Aortic valve stenosis     Endocrine   Hyperlipidemia associated with type 2 diabetes mellitus (HCC)   Diabetes mellitus (HCC)     Other   Smoker   S/P AVR   Gout   Encounter for annual wellness visit (AWV) in Medicare patient - Primary   ED (erectile dysfunction) of organic origin   Benign prostatic hyperplasia with urinary frequency   Avitaminosis D   Other Visit Diagnoses       Screening for deficiency anemia            Assessment & Plan Annual Physical Examination Routine annual physical examination for a 68 year old male with multiple chronic conditions. Emphasized the importance of a balanced diet and regular exercise, recommending 150 minutes of moderate activity per week, including activities like gardening and walking. - Recommend 150 minutes of moderate exercise per week - Encourage a  balanced diet  Type 2 Diabetes Mellitus Type 2 diabetes mellitus with a need to update A1c levels to assess current glycemic control. Discussed semaglutide  (Ozempic ) for weight loss and its common side effect of constipation. Recommended increasing water intake and using Miralax to manage constipation, adjusting the dose as needed to avoid loose stools. - Order A1c test - Advise increasing water intake to 1-1.5 liters per day - Recommend Miralax for constipation, adjusting dose as needed  Hypertension Hypertension, no specific issues discussed during this visit.  Aortic Valve Stenosis Aortic valve stenosis with a cardiology follow-up in August. No current issues reported.  Hyperlipidemia Hyperlipidemia, plan to check cholesterol levels as part of routine monitoring. - Order cholesterol test  Asthmatic Bronchitis Asthmatic bronchitis, no specific issues discussed during this visit.  Benign Prostatic Hyperplasia Benign prostatic hyperplasia with urinary frequency, plan to check PSA levels as part of routine monitoring. - Order PSA test  Vitamin D Deficiency Vitamin D deficiency, plan to check vitamin D levels as part of routine monitoring. - Order vitamin D level test  Gout Gout, plan to check uric acid levels as part of routine monitoring. - Order uric acid level test  Hernia Significant hernia present, not currently causing pain or discomfort. Surgical intervention not necessary unless it becomes painful. He is aware of the option for surgical referral if needed. - Consider referral to general surgery if hernia becomes painful  General Health Maintenance Discussed the importance of regular vaccinations, specifically the COVID-19 booster. He had a COVID-19 vaccine in December and is due for a booster between June and January. Recommended obtaining the booster at a pharmacy as it is not available in the office. - Recommend COVID-19 booster vaccine at a pharmacy  Chronic Back  Pain  Refilled flexeril  5mg  at bedtime PRN        No follow-ups on file.       Rockie Agent, MD  [facility] [phone] (phone) [phone] (fax)  [facility]

## 2023-12-20 ENCOUNTER — Ambulatory Visit: Payer: Self-pay | Admitting: Family Medicine

## 2023-12-20 LAB — LIPID PANEL
Chol/HDL Ratio: 4.1 ratio (ref 0.0–5.0)
Cholesterol, Total: 119 mg/dL (ref 100–199)
HDL: 29 mg/dL — ABNORMAL LOW (ref >39)
LDL Chol Calc (NIH): 64 mg/dL (ref 0–99)
Triglycerides: 146 mg/dL (ref 0–149)
VLDL Cholesterol Cal: 26 mg/dL (ref 5–40)

## 2023-12-20 LAB — PSA TOTAL (REFLEX TO FREE): Prostate Specific Ag, Serum: 2.5 ng/mL (ref 0.0–4.0)

## 2023-12-20 LAB — CBC
Hematocrit: 46.9 % (ref 37.5–51.0)
Hemoglobin: 15.4 g/dL (ref 13.0–17.7)
MCH: 30.2 pg (ref 26.6–33.0)
MCHC: 32.8 g/dL (ref 31.5–35.7)
MCV: 92 fL (ref 79–97)
Platelets: 151 10*3/uL (ref 150–450)
RBC: 5.1 x10E6/uL (ref 4.14–5.80)
RDW: 13.1 % (ref 11.6–15.4)
WBC: 6.7 10*3/uL (ref 3.4–10.8)

## 2023-12-20 LAB — HEMOGLOBIN A1C
Est. average glucose Bld gHb Est-mCnc: 166 mg/dL
Hgb A1c MFr Bld: 7.4 % — ABNORMAL HIGH (ref 4.8–5.6)

## 2023-12-20 LAB — BMP8+EGFR
BUN/Creatinine Ratio: 19 (ref 10–24)
BUN: 23 mg/dL (ref 8–27)
CO2: 21 mmol/L (ref 20–29)
Calcium: 10 mg/dL (ref 8.6–10.2)
Chloride: 104 mmol/L (ref 96–106)
Creatinine, Ser: 1.22 mg/dL (ref 0.76–1.27)
Glucose: 142 mg/dL — ABNORMAL HIGH (ref 70–99)
Potassium: 4.5 mmol/L (ref 3.5–5.2)
Sodium: 140 mmol/L (ref 134–144)
eGFR: 65 mL/min/{1.73_m2} (ref >59)

## 2023-12-20 LAB — VITAMIN D 25 HYDROXY (VIT D DEFICIENCY, FRACTURES): Vit D, 25-Hydroxy: 56.2 ng/mL (ref 30.0–100.0)

## 2023-12-20 LAB — URIC ACID: Uric Acid: 4.2 mg/dL (ref 3.8–8.4)

## 2023-12-25 ENCOUNTER — Other Ambulatory Visit: Payer: Self-pay | Admitting: Cardiovascular Disease

## 2023-12-25 DIAGNOSIS — I25118 Atherosclerotic heart disease of native coronary artery with other forms of angina pectoris: Secondary | ICD-10-CM

## 2023-12-25 DIAGNOSIS — I350 Nonrheumatic aortic (valve) stenosis: Secondary | ICD-10-CM

## 2023-12-25 DIAGNOSIS — Z952 Presence of prosthetic heart valve: Secondary | ICD-10-CM

## 2023-12-28 ENCOUNTER — Other Ambulatory Visit: Payer: Self-pay | Admitting: Family Medicine

## 2023-12-28 DIAGNOSIS — I10 Essential (primary) hypertension: Secondary | ICD-10-CM

## 2023-12-30 ENCOUNTER — Other Ambulatory Visit: Payer: Self-pay

## 2023-12-30 ENCOUNTER — Emergency Department

## 2023-12-30 ENCOUNTER — Observation Stay
Admission: EM | Admit: 2023-12-30 | Discharge: 2023-12-31 | Disposition: A | Discharge status: Home / Self Care | Attending: Emergency Medicine | Admitting: Emergency Medicine

## 2023-12-30 DIAGNOSIS — E1159 Type 2 diabetes mellitus with other circulatory complications: Secondary | ICD-10-CM | POA: Diagnosis present

## 2023-12-30 DIAGNOSIS — M545 Low back pain, unspecified: Secondary | ICD-10-CM | POA: Diagnosis not present

## 2023-12-30 DIAGNOSIS — N401 Enlarged prostate with lower urinary tract symptoms: Secondary | ICD-10-CM | POA: Diagnosis present

## 2023-12-30 DIAGNOSIS — E1169 Type 2 diabetes mellitus with other specified complication: Secondary | ICD-10-CM | POA: Diagnosis present

## 2023-12-30 DIAGNOSIS — Z7982 Long term (current) use of aspirin: Secondary | ICD-10-CM | POA: Insufficient documentation

## 2023-12-30 DIAGNOSIS — F1090 Alcohol use, unspecified, uncomplicated: Secondary | ICD-10-CM | POA: Insufficient documentation

## 2023-12-30 DIAGNOSIS — I6521 Occlusion and stenosis of right carotid artery: Secondary | ICD-10-CM | POA: Insufficient documentation

## 2023-12-30 DIAGNOSIS — Z952 Presence of prosthetic heart valve: Secondary | ICD-10-CM

## 2023-12-30 DIAGNOSIS — R42 Dizziness and giddiness: Principal | ICD-10-CM

## 2023-12-30 DIAGNOSIS — M109 Gout, unspecified: Secondary | ICD-10-CM | POA: Insufficient documentation

## 2023-12-30 DIAGNOSIS — E785 Hyperlipidemia, unspecified: Secondary | ICD-10-CM | POA: Diagnosis not present

## 2023-12-30 DIAGNOSIS — I10 Essential (primary) hypertension: Secondary | ICD-10-CM | POA: Diagnosis not present

## 2023-12-30 DIAGNOSIS — R1110 Vomiting, unspecified: Secondary | ICD-10-CM | POA: Diagnosis present

## 2023-12-30 DIAGNOSIS — E119 Type 2 diabetes mellitus without complications: Secondary | ICD-10-CM | POA: Diagnosis not present

## 2023-12-30 DIAGNOSIS — I350 Nonrheumatic aortic (valve) stenosis: Secondary | ICD-10-CM

## 2023-12-30 DIAGNOSIS — I2510 Atherosclerotic heart disease of native coronary artery without angina pectoris: Secondary | ICD-10-CM | POA: Insufficient documentation

## 2023-12-30 LAB — COMPREHENSIVE METABOLIC PANEL WITH GFR
ALT: 17 U/L (ref 0–44)
AST: 23 U/L (ref 15–41)
Albumin: 4.2 g/dL (ref 3.5–5.0)
Alkaline Phosphatase: 43 U/L (ref 38–126)
Anion gap: 11 (ref 5–15)
BUN: 24 mg/dL — ABNORMAL HIGH (ref 8–23)
CO2: 23 mmol/L (ref 22–32)
Calcium: 9.6 mg/dL (ref 8.9–10.3)
Chloride: 103 mmol/L (ref 98–111)
Creatinine, Ser: 1.14 mg/dL (ref 0.61–1.24)
GFR, Estimated: >60 mL/min (ref >60)
Glucose, Bld: 209 mg/dL — ABNORMAL HIGH (ref 70–99)
Potassium: 3.9 mmol/L (ref 3.5–5.1)
Sodium: 137 mmol/L (ref 135–145)
Total Bilirubin: 0.9 mg/dL (ref 0.0–1.2)
Total Protein: 6.9 g/dL (ref 6.5–8.1)

## 2023-12-30 LAB — URINALYSIS, ROUTINE W REFLEX MICROSCOPIC
Bacteria, UA: NONE SEEN
Bilirubin Urine: NEGATIVE
Glucose, UA: >=500 mg/dL — AB
Hgb urine dipstick: NEGATIVE
Ketones, ur: NEGATIVE mg/dL
Leukocytes,Ua: NEGATIVE
Nitrite: NEGATIVE
Protein, ur: NEGATIVE mg/dL
Specific Gravity, Urine: 1.027 (ref 1.005–1.030)
Squamous Epithelial / HPF: 0 /HPF (ref 0–5)
pH: 7 (ref 5.0–8.0)

## 2023-12-30 LAB — TROPONIN I (HIGH SENSITIVITY): Troponin I (High Sensitivity): 5 ng/L (ref <18)

## 2023-12-30 LAB — CBC
HCT: 44.2 % (ref 39.0–52.0)
Hemoglobin: 15 g/dL (ref 13.0–17.0)
MCH: 29.9 pg (ref 26.0–34.0)
MCHC: 33.9 g/dL (ref 30.0–36.0)
MCV: 88.2 fL (ref 80.0–100.0)
Platelets: 123 K/uL — ABNORMAL LOW (ref 150–400)
RBC: 5.01 MIL/uL (ref 4.22–5.81)
RDW: 12.9 % (ref 11.5–15.5)
WBC: 10.6 K/uL — ABNORMAL HIGH (ref 4.0–10.5)
nRBC: 0 % (ref 0.0–0.2)

## 2023-12-30 LAB — LIPASE, BLOOD: Lipase: 75 U/L — ABNORMAL HIGH (ref 11–51)

## 2023-12-30 MED ORDER — LOSARTAN POTASSIUM-HCTZ 50-12.5 MG PO TABS: 1.0000 | ORAL_TABLET | Freq: Every day | ORAL | Status: DC

## 2023-12-30 MED ORDER — ALBUTEROL SULFATE (2.5 MG/3ML) 0.083% IN NEBU: 2.5000 mg | INHALATION_SOLUTION | Freq: Four times a day (QID) | RESPIRATORY_TRACT | Status: DC | PRN

## 2023-12-30 MED ORDER — ASPIRIN 81 MG PO CHEW
243.0000 mg | CHEWABLE_TABLET | Freq: Once | ORAL | Status: AC
  Administered 2023-12-30: 243 mg via ORAL
  Filled 2023-12-30: qty 3

## 2023-12-30 MED ORDER — ICOSAPENT ETHYL 1 G PO CAPS
2.0000 g | ORAL_CAPSULE | Freq: Two times a day (BID) | ORAL | Status: DC
  Administered 2023-12-31 (×2): 2 g via ORAL
  Filled 2023-12-30 (×2): qty 2

## 2023-12-30 MED ORDER — OXYBUTYNIN CHLORIDE 5 MG PO TABS
5.0000 mg | ORAL_TABLET | Freq: Three times a day (TID) | ORAL | Status: DC
  Administered 2023-12-31 (×2): 5 mg via ORAL
  Filled 2023-12-30 (×3): qty 1

## 2023-12-30 MED ORDER — HYDROCHLOROTHIAZIDE 12.5 MG PO TABS
12.5000 mg | ORAL_TABLET | Freq: Every day | ORAL | Status: DC
  Administered 2023-12-31: 12.5 mg via ORAL
  Filled 2023-12-30: qty 1

## 2023-12-30 MED ORDER — ALLOPURINOL 300 MG PO TABS
300.0000 mg | ORAL_TABLET | Freq: Every day | ORAL | Status: DC
  Administered 2023-12-31: 300 mg via ORAL
  Filled 2023-12-30 (×2): qty 1

## 2023-12-30 MED ORDER — OXYBUTYNIN CHLORIDE 5 MG PO TABS
5.0000 mg | ORAL_TABLET | Freq: Three times a day (TID) | ORAL | Status: DC
  Filled 2023-12-30 (×2): qty 1

## 2023-12-30 MED ORDER — METOPROLOL TARTRATE 25 MG PO TABS
25.0000 mg | ORAL_TABLET | Freq: Two times a day (BID) | ORAL | Status: DC
  Administered 2023-12-30 – 2023-12-31 (×2): 25 mg via ORAL
  Filled 2023-12-30 (×2): qty 1

## 2023-12-30 MED ORDER — LORATADINE 10 MG PO TABS
10.0000 mg | ORAL_TABLET | Freq: Every day | ORAL | Status: DC
  Administered 2023-12-30 – 2023-12-31 (×2): 10 mg via ORAL
  Filled 2023-12-30 (×2): qty 1

## 2023-12-30 MED ORDER — IOHEXOL 350 MG/ML SOLN
75.0000 mL | Freq: Once | INTRAVENOUS | Status: AC | PRN
  Administered 2023-12-30: 75 mL via INTRAVENOUS

## 2023-12-30 MED ORDER — FENOFIBRATE 54 MG PO TABS
54.0000 mg | ORAL_TABLET | Freq: Every day | ORAL | Status: DC
  Administered 2023-12-31: 54 mg via ORAL
  Filled 2023-12-30: qty 1

## 2023-12-30 MED ORDER — MECLIZINE HCL 25 MG PO TABS: 25.0000 mg | ORAL_TABLET | Freq: Three times a day (TID) | ORAL | Status: DC | PRN

## 2023-12-30 MED ORDER — INSULIN ASPART 100 UNIT/ML IJ SOLN
0.0000 [IU] | Freq: Three times a day (TID) | INTRAMUSCULAR | Status: DC
  Filled 2023-12-30: qty 1

## 2023-12-30 MED ORDER — VITAMIN D 25 MCG (1000 UNIT) PO TABS
2000.0000 [IU] | ORAL_TABLET | Freq: Every evening | ORAL | Status: DC
  Administered 2023-12-30: 2000 [IU] via ORAL
  Filled 2023-12-30: qty 2

## 2023-12-30 MED ORDER — ACETAMINOPHEN 325 MG PO TABS
650.0000 mg | ORAL_TABLET | Freq: Four times a day (QID) | ORAL | Status: DC | PRN
Start: 2023-12-30 — End: 2023-12-31

## 2023-12-30 MED ORDER — ONDANSETRON HCL 4 MG PO TABS
4.0000 mg | ORAL_TABLET | Freq: Four times a day (QID) | ORAL | Status: DC | PRN
Start: 2023-12-30 — End: 2023-12-31

## 2023-12-30 MED ORDER — ROSUVASTATIN CALCIUM 5 MG PO TABS
5.0000 mg | ORAL_TABLET | Freq: Every day | ORAL | Status: DC
  Administered 2023-12-31: 5 mg via ORAL
  Filled 2023-12-30 (×2): qty 1

## 2023-12-30 MED ORDER — CYCLOBENZAPRINE HCL 10 MG PO TABS
5.0000 mg | ORAL_TABLET | Freq: Every day | ORAL | Status: DC
  Administered 2023-12-30: 5 mg via ORAL
  Filled 2023-12-30: qty 1

## 2023-12-30 MED ORDER — EZETIMIBE 10 MG PO TABS
10.0000 mg | ORAL_TABLET | Freq: Every day | ORAL | Status: DC
  Administered 2023-12-31: 10 mg via ORAL
  Filled 2023-12-30: qty 1

## 2023-12-30 MED ORDER — ACETAMINOPHEN 650 MG RE SUPP
650.0000 mg | Freq: Four times a day (QID) | RECTAL | Status: DC | PRN
Start: 2023-12-30 — End: 2023-12-31

## 2023-12-30 MED ORDER — TRAMADOL HCL 50 MG PO TABS: 50.0000 mg | ORAL_TABLET | Freq: Four times a day (QID) | ORAL | Status: DC | PRN

## 2023-12-30 MED ORDER — MAGNESIUM OXIDE -MG SUPPLEMENT 400 (240 MG) MG PO TABS
400.0000 mg | ORAL_TABLET | Freq: Two times a day (BID) | ORAL | Status: DC
Start: 2023-12-30 — End: 2023-12-31
  Administered 2023-12-31 (×2): 400 mg via ORAL
  Filled 2023-12-30 (×2): qty 1

## 2023-12-30 MED ORDER — ASPIRIN 81 MG PO TBEC
81.0000 mg | DELAYED_RELEASE_TABLET | Freq: Every day | ORAL | Status: DC
  Administered 2023-12-31: 81 mg via ORAL
  Filled 2023-12-30: qty 1

## 2023-12-30 MED ORDER — ENOXAPARIN SODIUM 60 MG/0.6ML IJ SOSY
0.5000 mg/kg | PREFILLED_SYRINGE | INTRAMUSCULAR | Status: DC
  Administered 2023-12-31: 52.5 mg via SUBCUTANEOUS
  Filled 2023-12-30: qty 0.6

## 2023-12-30 MED ORDER — LOSARTAN POTASSIUM 50 MG PO TABS
50.0000 mg | ORAL_TABLET | Freq: Every day | ORAL | Status: DC
  Administered 2023-12-31: 50 mg via ORAL
  Filled 2023-12-30: qty 1

## 2023-12-30 MED ORDER — ASPIRIN 81 MG PO CHEW: 324.0000 mg | CHEWABLE_TABLET | Freq: Once | ORAL | Status: DC

## 2023-12-30 MED ORDER — ONDANSETRON HCL 4 MG/2ML IJ SOLN: 4.0000 mg | Freq: Four times a day (QID) | INTRAMUSCULAR | Status: DC | PRN

## 2023-12-30 NOTE — ED Provider Notes (Signed)
-----------------------------------------   7:27 PM on 12/30/2023 -----------------------------------------   Blood pressure 121/70, pulse 74, temperature 97.8 F (36.6 C), temperature source Oral, resp. rate 18, height 5' 11 (1.803 m), weight 103.9 kg, SpO2 100%.  The patient is calm and cooperative at this time.  Patient was signed out pending discussion with hospitalist for new onset nausea vomiting and vertigo symptoms.  Otherwise relatively reassuring workup.  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  Patient admitted to hospitalist for ongoing TIA/vertigo workup.   Malvina Alm DASEN, MD 12/30/23 [id number]

## 2023-12-30 NOTE — H&P (Signed)
 History and Physical    Patient: Jeffery West FMW:[id number] DOB: 27-Oct-1955 DOA: 12/30/2023 DOS: the patient was seen and examined on 12/30/2023 PCP: Sharma Coyer, MD  Patient coming from: Home  Chief Complaint:  Chief Complaint  Patient presents with   Emesis   HPI: Jeffery West is a 68 y.o. male with medical history significant of type 2 diabetes, HTN, HLD, aortic stenosis s/p valve replacement, CAD s/o CABGx2 in June 2021, asthma, BPH, gout, chronic back pain who presented to the ED today for evaluation of dizziness associated with nausea vomiting and inability to ambulate.  Pt reports onset this morning as he was arriving to work.  He initially felt mildly unsteady walking in from his vehicle, but as he was beginning work, became progressively dizzy, followed by nausea and vomiting.  He reports going to the restroom, being in their about an hour and a half, and unable to get up to even sit in a chair.  He was too unsteady to stand.  He describes room-spinning if he opened his eyes, so he had to keep them closed.  He otherwise has had no recent illnesses, but does report some sensation of fullness in the left ear and diminished hearing if he covers his right ear.  He denies focal / unilateral weakness numbness or tingling, no difficulty speaking swallowing or understanding others.  No fever/chills, chest pain, shortness of breath, cough, sore throat, abdominal pain, nausea, vomiting, diarrhea or other recent symptoms.  At the time of my encounter, patient's symptoms had almost fully resolved.  He is able to sit up edge of the stretcher, and can turn head back and forth without recurrent symptoms.  He describes mild intermittent dizziness but never anything this severe.    In the ED, initial vitals temp 98.1 F, HR 71, RR 18, BP 126/66, spO2 95% on room air. Labs were obtained including CMP, CBC and troponin which were notable only for non-fasting glucose 209, BUN 24, lipase mildly  elevate 75, normal LFT's and renal function.  WBC mildly elevated at 10.6.  Normal hs-troponin of 5.  EKG without acute ischemic changes.  Stroke evaluation was initiated in the ED.   CT head was non-acute. MRI brain was negative for acute findings, but did show mild chronic microvascular ischemic changes and mild parenchymal volume loss; chronic microhemorrhages in the left cerebellum and left frontal lobe. CTA head/neck showed high-grade stenosis of the posterior genu and posterior horizontal cavernous segment of the right internal carotid artery; mild calcific plaque within the carotid bulbs bilaterally, with no flow limiting stenosis.  I discussed the abpve findings with Neurology, and they do not feel the ICA stenosis  would explain patient's symptoms.  Agreed with admission for observation and further evaluation and formal Neurology consult if indicated.  Patient and wife are agreeable to observation on telemetry and further evaluation of dizziness felt due to either vertigo vs potentially TIA.    Review of Systems: As mentioned in the history of present illness. All other systems reviewed and are negative.    Past Medical History:  Diagnosis Date   Coronary artery disease    Diabetes mellitus without complication (HCC)    Erectile dysfunction    Gout    Heart murmur    Hyperlipidemia    Hypertension    Obesity    Osteoarthritis    Vitamin D  deficiency    Past Surgical History:  Procedure Laterality Date   AORTIC VALVE REPLACEMENT N/A 11/26/2019  Procedure: AORTIC VALVE REPLACEMENT (AVR) USING INSPIRIS AORTIC VALVE SIZE ;  Surgeon: Lucas Dorise POUR, MD;  Location: [facility] OR;  Service: Open Heart Surgery;  Laterality: N/A;   CARDIAC CATHETERIZATION     COLONOSCOPY WITH PROPOFOL  N/A 08/24/2019   Procedure: COLONOSCOPY WITH PROPOFOL ;  Surgeon: Jinny Carmine, MD;  Location: [facility] ENDOSCOPY;  Service: Endoscopy;  Laterality: N/A;   CORONARY ARTERY BYPASS GRAFT N/A 11/26/2019    Procedure: CORONARY ARTERY BYPASS GRAFTING (CABG) X Two , using left internal mammary artery and right leg greater saphenous vein harvested endoscopically.;  Surgeon: Lucas Dorise POUR, MD;  Location: [facility] OR;  Service: Open Heart Surgery;  Laterality: N/A;   LEFT HEART CATH AND CORONARY ANGIOGRAPHY Left 10/19/2019   Procedure: LEFT HEART CATH AND CORONARY ANGIOGRAPHY;  Surgeon: Perla Evalene PARAS, MD;  Location: [facility] INVASIVE CV LAB;  Service: Cardiovascular;  Laterality: Left;   NO PAST SURGERIES     RIGHT HEART CATH N/A 10/19/2019   Procedure: RIGHT HEART CATH;  Surgeon: Perla Evalene PARAS, MD;  Location: [facility] INVASIVE CV LAB;  Service: Cardiovascular;  Laterality: N/A;   SHOULDER ARTHROSCOPY WITH OPEN ROTATOR CUFF REPAIR Right 07/26/2017   Procedure: SHOULDER ARTHROSCOPY WITH OPEN ROTATOR CUFF REPAIR,SUBACROMINAL DECOMPRESSION,DISTAL CLAVICLE EXCISION;  Surgeon: Marchia Drivers, MD;  Location: [facility] ORS;  Service: Orthopedics;  Laterality: Right;   TEE WITHOUT CARDIOVERSION N/A 11/26/2019   Procedure: TRANSESOPHAGEAL ECHOCARDIOGRAM (TEE);  Surgeon: Lucas Dorise POUR, MD;  Location: [facility] OR;  Service: Open Heart Surgery;  Laterality: N/A;   Social History:  reports that he has never smoked. He has never used smokeless tobacco. He reports current alcohol use. He reports that he does not use drugs.  Allergies  Allergen Reactions   Penicillins Hives, Swelling and Other (See Comments)    Has patient had a PCN reaction causing immediate rash, facial/tongue/throat swelling, SOB or lightheadedness with hypotension: Unknown Has patient had a PCN reaction causing severe rash involving mucus membranes or skin necrosis: No Has patient had a PCN reaction that required hospitalization: no Has patient had a PCN reaction occurring within the last 10 years: No If all of the above answers are NO, then may proceed with Cephalosporin use.     Family History  Problem Relation Age of Onset   Asthma Mother    Cancer  Father        possibly in liver and lung   Hypertension Brother     Prior to Admission medications   Medication Sig Start Date End Date Taking? Authorizing Provider  acetaminophen  (TYLENOL ) 500 MG tablet Take 1,000 mg by mouth every 6 (six) hours as needed for moderate pain or headache.    [provider]  albuterol  (VENTOLIN  HFA) 108 (90 Base) MCG/ACT inhaler INHALE 1 PUFF INTO THE LUNGS EVERY 6 HOURS AS NEEDED FOR WHEEZING OR SHORTNESS OF BREATH. 12/03/21   Bertrum Charlie CROME, MD  allopurinol  (ZYLOPRIM ) 300 MG tablet TAKE 1 TABLET BY MOUTH EVERY DAY 06/24/23   Simmons-Robinson, Rockie, MD  aspirin  EC 81 MG tablet Take 81 mg by mouth daily. Swallow whole.    [provider]  Cholecalciferol  (VITAMIN D ) 2000 units CAPS Take 2,000 Units by mouth every evening.     [provider]  cyclobenzaprine  (FLEXERIL ) 5 MG tablet Take 1 tablet (5 mg total) by mouth at bedtime. 12/19/23   Simmons-Robinson, Rockie, MD  empagliflozin  (JARDIANCE ) 25 MG TABS tablet Take 1 tablet (25 mg total) by mouth daily. 04/06/23   Simmons-Robinson, Rockie, MD  ezetimibe  (  ZETIA ) 10 MG tablet Take 1 tablet (10 mg total) by mouth daily. 08/04/23   Simmons-Robinson, Rockie, MD  fenofibrate  (TRICOR ) 48 MG tablet TAKE 1 TABLET BY MOUTH EVERY DAY 12/12/23   Simmons-Robinson, Rockie, MD  glucose blood (ONETOUCH VERIO) test strip Use as instructed; BID 12/25/19   Bertrum Charlie CROME, MD  icosapent  Ethyl (VASCEPA ) 1 g capsule TAKE 2 CAPSULES BY MOUTH TWICE A DAY 07/04/23   Simmons-Robinson, Makiera, MD  Insulin  Pen Needle (BD PEN NEEDLE NANO 2ND GEN) 32G X 4 MM MISC USE AS DIRECTED 05/27/23   Simmons-Robinson, Makiera, MD  losartan -hydrochlorothiazide  (HYZAAR) 50-12.5 MG tablet TAKE 1 TABLET BY MOUTH ONCE DAILY 12/29/23   Simmons-Robinson, Makiera, MD  magnesium  oxide (MAG-OX) 400 (240 Mg) MG tablet TAKE 1 TABLET BY MOUTH TWICE A DAY 10/26/21   Bertrum Charlie CROME, MD  metFORMIN  (GLUCOPHAGE ) 1000 MG tablet TAKE 1  TABLET (1,000 MG TOTAL) BY MOUTH TWICE A DAY WITH FOOD 07/13/23   Simmons-Robinson, Rockie, MD  metoprolol  tartrate (LOPRESSOR ) 25 MG tablet TAKE 1 TABLET BY MOUTH TWICE A DAY 12/27/23   Gollan, Timothy J, MD  nystatin  cream (MYCOSTATIN ) APPLY TO AFFECTED AREA TWICE A DAY 05/27/23   Simmons-Robinson, Makiera, MD  OneTouch Delica Lancets 30G MISC Use 3 times a day 12/25/19   Bertrum Charlie CROME, MD  oxybutynin  (DITROPAN ) 5 MG tablet TAKE 1 TABLET BY MOUTH 3 TIMES DAILY 08/05/23   Simmons-Robinson, Makiera, MD  rosuvastatin  (CRESTOR ) 5 MG tablet TAKE 1 TABLET BY MOUTH EVERY DAY 04/22/23   Gollan, Timothy J, MD  Semaglutide , 2 MG/DOSE, 8 MG/3ML SOPN Inject 2 mg as directed once a week. 09/19/23   Simmons-Robinson, Rockie, MD  sildenafil  (REVATIO ) 20 MG tablet Take 1 tablet (20 mg total) by mouth 3 (three) times daily as needed. 11/23/21   Gollan, Timothy J, MD  traMADol  (ULTRAM ) 50 MG tablet Take 50 mg by mouth every 6 (six) hours as needed. 09/10/20   [provider]  TRESIBA  FLEXTOUCH 100 UNIT/ML FlexTouch Pen INJECT 30 UNITS SUBCUTANEOUSLY ONCE DAILY AT 10:00PM 10/05/23   Simmons-Robinson, Rockie, MD    Physical Exam: Vitals:   12/30/23 0910 12/30/23 0922 12/30/23 1050 12/30/23 1422  BP:  126/66  121/70  Pulse:  71  74  Resp:  18  18  Temp:  98.1 F (36.7 C)  97.8 F (36.6 C)  TempSrc:  Oral  Oral  SpO2:  95% 95% 100%  Weight: 103.9 kg     Height: 5' 11 (1.803 m)      General exam: awake, alert, no acute distress HEENT: atraumatic, clear conjunctiva, anicteric sclera, moist mucus membranes, hearing grossly normal  Respiratory system: CTAB but generally diminished, no wheezes, rales or rhonchi, normal respiratory effort. Cardiovascular system: normal S1/S2, RRR, no JVD, murmurs, rubs, gallops, no pedal edema.   Gastrointestinal system: soft, NT, ND Central nervous system: A&O x 4. no Tabet focal neurologic deficits, normal speech Extremities: moves all , no edema, normal tone Skin:  dry, intact, normal temperature, normal color, No rashes, lesions or ulcers on visualized skin Psychiatry: normal mood, congruent affect, judgement and insight appear normal   Data Reviewed:  As reviewed in detail above  Assessment and Plan:  Dizziness -- suspect vertigo/BPPV vs possible TIA. Stroke ruled out, MRI brain negative. --Telemetry --Echocardiogram --PT for vestibular evaluation --Meclizine  PRN  --Claritin   --Recent A1c is 7.4% --Recent lipid panel LDL 64, TG's 146, HDL low 29 --Recommend outpatient ENT referral  Type 2 Diabetes -- A1c 7.4%  recently, improved from 8.4% --Hold home regimen --Sliding scale Novolog   Asthma - stable, no wheezing --PRN albuterol  nebs  HTN --Resume home losartan /hydrochlorothiazide  and metoprolol   HLD --Resume home Zetia , fenofibrate , Crestor   Gout - no flare symptoms reported -- Resume home allopurinol   Chronic back pain --Continue home tramadol  and flexeril   Right ICA stenosis -- noted on CTA head/neck as part of stroke evaluation. --Recommend vascular surgery referral as outpatient    Advance Care Planning:   Code Status: Full Code   Consults: None at this time.  Family Communication: wife at bedside during admission encounter  Severity of Illness: The appropriate patient status for this patient is OBSERVATION. Observation status is judged to be reasonable and necessary in order to provide the required intensity of service to ensure the patient's safety. The patient's presenting symptoms, physical exam findings, and initial radiographic and laboratory data in the context of their medical condition is felt to place them at decreased risk for further clinical deterioration. Furthermore, it is anticipated that the patient will be medically stable for discharge from the hospital within 2 midnights of admission.   Author: Burnard DELENA Cunning, DO 12/30/2023 5:10 PM  For on call review [email or web address].

## 2023-12-30 NOTE — ED Triage Notes (Signed)
 Pt comes via EMS from work with N/V. Pt states ear problems and maybe some vertigo. Pt gfot 4 zofran  and IV in LAC.

## 2023-12-30 NOTE — ED Provider Notes (Signed)
 [facility] Provider Note    Event Date/Time   First MD Initiated Contact with Patient 12/30/23 1045     (approximate)   History   Emesis   HPI  Jeffery West is a 68 y.o. male past medical history significant for hypertension, hyperlipidemia, diabetes, CAD presents to the emergency department following an episode of dizziness.  Patient states that he got up to go to work and he was feeling somewhat nauseous.     Physical Exam   Triage Vital Signs: ED Triage Vitals  Encounter Vitals Group     BP 12/30/23 0922 126/66     Girls Systolic BP Percentile --      Girls Diastolic BP Percentile --      Boys Systolic BP Percentile --      Boys Diastolic BP Percentile --      Pulse Rate 12/30/23 0922 71     Resp 12/30/23 0922 18     Temp 12/30/23 0922 98.1 F (36.7 C)     Temp Source 12/30/23 0922 Oral     SpO2 12/30/23 0922 95 %     Weight 12/30/23 0910 229 lb (103.9 kg)     Height 12/30/23 0910 5' 11 (1.803 m)     Head Circumference --      Peak Flow --      Pain Score 12/30/23 0910 0     Pain Loc --      Pain Education --      Exclude from Growth Chart --     Most recent vital signs: Vitals:   12/30/23 0922 12/30/23 1050  BP: 126/66   Pulse: 71   Resp: 18   Temp: 98.1 F (36.7 C)   SpO2: 95% 95%    Physical Exam  IMPRESSION / MDM / ASSESSMENT AND PLAN / ED COURSE  I reviewed the triage vital signs and the nursing notes.  *** EKG  I, Clotilda Punter, the attending physician, personally viewed and interpreted this ECG.   Rate: Normal  Rhythm: Normal sinus  Axis: Normal  Intervals: Normal  ST&T Change: None  No tachycardic or bradycardic dysrhythmias while on cardiac telemetry.  RADIOLOGY I independently reviewed imaging, my interpretation of imaging: ***  LABS (all labs ordered are listed, but only abnormal results are displayed) Labs interpreted as -    Labs Reviewed  LIPASE, BLOOD - Abnormal; Notable for the following  components:      Result Value   Lipase 75 (*)    All other components within normal limits  COMPREHENSIVE METABOLIC PANEL WITH GFR - Abnormal; Notable for the following components:   Glucose, Bld 209 (*)    BUN 24 (*)    All other components within normal limits  CBC - Abnormal; Notable for the following components:   WBC 10.6 (*)    Platelets 123 (*)    All other components within normal limits  URINALYSIS, ROUTINE W REFLEX MICROSCOPIC - Abnormal; Notable for the following components:   Color, Urine YELLOW (*)    APPearance CLEAR (*)    Glucose, UA >=500 (*)    All other components within normal limits     MDM   Clinical Course as of 12/30/23 1605  Fri Dec 30, 2023  1600 Patient with ongoing dizziness. Offered admission for TIA work up and patient would like to come in [DW]    Clinical Course User Index [DW] Malvina Alm DASEN, MD     PROCEDURES:  Critical  Care performed: No  Procedures  Patient's presentation is most consistent with {EM COPA:27473}   MEDICATIONS ORDERED IN ED: Medications - No data to display  FINAL CLINICAL IMPRESSION(S) / ED DIAGNOSES   Final diagnoses:  None     Rx / DC Orders   ED Discharge Orders     None        Note:  This document was prepared using Dragon voice recognition software and may include unintentional dictation errors.

## 2023-12-31 ENCOUNTER — Observation Stay (HOSPITAL_BASED_OUTPATIENT_CLINIC_OR_DEPARTMENT_OTHER)
Admit: 2023-12-31 | Discharge: 2023-12-31 | Disposition: A | Discharge status: Home / Self Care | Attending: Internal Medicine | Admitting: Internal Medicine

## 2023-12-31 DIAGNOSIS — R42 Dizziness and giddiness: Secondary | ICD-10-CM | POA: Diagnosis not present

## 2023-12-31 DIAGNOSIS — Z952 Presence of prosthetic heart valve: Secondary | ICD-10-CM

## 2023-12-31 DIAGNOSIS — R008 Other abnormalities of heart beat: Secondary | ICD-10-CM

## 2023-12-31 LAB — ECHOCARDIOGRAM COMPLETE
AR max vel: 1.63 cm2
AV Area VTI: 1.84 cm2
AV Area mean vel: 1.73 cm2
AV Mean grad: 8 mmHg
AV Peak grad: 15.5 mmHg
Ao pk vel: 1.97 m/s
Area-P 1/2: 3.54 cm2
Height: 71 in
S' Lateral: 3.15 cm
Single Plane A4C EF: 69.1 %
Weight: 3664 [oz_av]

## 2023-12-31 LAB — HIV ANTIBODY (ROUTINE TESTING W REFLEX): HIV Screen 4th Generation wRfx: NONREACTIVE

## 2023-12-31 LAB — CBG MONITORING, ED
Glucose-Capillary: 127 mg/dL — ABNORMAL HIGH (ref 70–99)
Glucose-Capillary: 124 mg/dL — ABNORMAL HIGH (ref 70–99)

## 2023-12-31 MED ORDER — FLUTICASONE PROPIONATE 50 MCG/ACT NA SUSP: 1.0000 | Freq: Every day | NASAL | Status: DC

## 2023-12-31 MED ORDER — MECLIZINE HCL 25 MG PO TABS: 25.0000 mg | ORAL_TABLET | Freq: Three times a day (TID) | ORAL | 0 refills | Status: DC | PRN

## 2023-12-31 MED ORDER — LORATADINE 10 MG PO TABS: 10.0000 mg | ORAL_TABLET | Freq: Every day | ORAL | Status: AC

## 2023-12-31 MED ORDER — PERFLUTREN LIPID MICROSPHERE
1.0000 mL | INTRAVENOUS | Status: AC | PRN
  Administered 2023-12-31: 4 mL via INTRAVENOUS

## 2023-12-31 NOTE — Evaluation (Signed)
 Physical Therapy Evaluation Patient Details Name: Jeffery West MRN: [id number] DOB: 1955-11-28 Today's Date: 12/31/2023  History of Present Illness  Pt admitted to [facility] on 12/30/23 under observation for c/o nausea and vomiting secondary to dizziness. Significant PMH includes: HTN, HLD, T2DM,  aortic stenosis s/p valve replacement, CAD s/o CABGx2 (June 2021), asthma, BPH, gout, chronic back pain. Imaging negative for acute abnormality, but is significant for chronic microhemorrhages in L cerebellum and L frontal lobe as well as high grade stenosis of R ICA.   Clinical Impression  Pt is a 68 year old M admitted to hospital on 12/30/23 for episode of vertigo. At baseline, pt is completely IND with ADL's, IADL's, ambulation without AD, driving, working part time, and medication management.   Pt presents without acute deficits today, demonstrating IND ambulation to/from bathroom (outside of hospital room) prior to PT entry. Vestibular exam including: L Dix-Hallpike, bil modified Dix-Hallpike, and bil roll test negative. Per subjective reports, it does sound like pt was suffering from acute vertigo episode (room spinning dizziness, nausea, vomiting), which has likely resolved on its own given negative testing.   Pt currently at his baseline level of function, therefore, PT to sign off.         If plan is discharge home, recommend the following: Assist for transportation (if dizziness persists)   Can travel by private vehicle    Yes    Equipment Recommendations None recommended by PT  Recommendations for Other Services       Functional Status Assessment Patient has not had a recent decline in their functional status     Precautions / Restrictions        Mobility  Bed Mobility Overal bed mobility: Independent                  Transfers                   General transfer comment: pt seen ambulating to/from bathroom prior to session without difficulty; likely IND with  mobility    Ambulation/Gait               General Gait Details: pt seen ambulating to/from bathroom prior to session without difficulty; likely IND with mobility    Balance Overall balance assessment: No apparent balance deficits (not formally assessed)                                           Pertinent Vitals/Pain Pain Assessment Pain Assessment: No/denies pain    Home Living Family/patient expects to be discharged to:: Private residence Living Arrangements: Spouse/significant other Available Help at Discharge: Family;Available 24 hours/day Type of Home: House Home Access: Stairs to enter Entrance Stairs-Rails: Can reach both Entrance Stairs-Number of Steps: back (main): 5   Home Layout: One level Home Equipment: Agricultural consultant (2 wheels)      Prior Function Prior Level of Function : Working/employed;Driving             Mobility Comments: IND with ambulation without AD. 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 ADLs Comments: IND with ADL's, IADL's, and medication management     Extremity/Trunk Assessment   Upper Extremity Assessment Upper Extremity Assessment: Overall WFL for tasks assessed (N/T in bil hands, contributes to carpal tunnel syndrome with intermittent dropping of objects. Hx R RTC repair with arthritis)  Lower Extremity Assessment Lower Extremity Assessment: Overall WFL for tasks assessed       Communication   Communication Communication: No apparent difficulties    Cognition Arousal: Alert Behavior During Therapy: WFL for tasks assessed/performed   PT - Cognitive impairments: No apparent impairments                         Following commands: Intact       Cueing Cueing Techniques: Verbal cues     General Comments General comments (skin integrity, edema, etc.): Vestibular eval performed: negative L Dix-hallpike; opted for L/R modified Dix-Hallpike  secondary to c/o cLBP. Negative bilaterally. Negative roll test bilaterally. States he no longer feels dizzy. Continues to have decreased L hearing and ear fullness.    Exercises Other Exercises Other Exercises: Pt educated re: PT Role/POC, DC recommendations, (OPOT for bil carpal tunnel and OPPT for cLBP flare ups as needed), vestibular A&P, reocurrence rate, eval/treatment, cont amb while admitted, OPPT for vestibular rehab with onset of concordant dizziness. He verbalized understanding.   Assessment/Plan    PT Assessment Patient does not need any further PT services  PT Problem List  No impairments           PT Goals (Current goals can be found in the Care Plan section)  Acute Rehab PT Goals Patient Stated Goal: go home Additional Goals Additional Goal #1: Pt will verbalize understanding of vestibular education and self management of symptoms at DC.    Frequency  Eval only        AM-PAC PT 6 Clicks Mobility  Outcome Measure Help needed turning from your back to your side while in a flat bed without using bedrails?: None Help needed moving from lying on your back to sitting on the side of a flat bed without using bedrails?: None Help needed moving to and from a bed to a chair (including a wheelchair)?: None Help needed standing up from a chair using your arms (e.g., wheelchair or bedside chair)?: None Help needed to walk in hospital room?: None Help needed climbing 3-5 steps with a railing? : None 6 Click Score: 24    End of Session   Activity Tolerance: Patient tolerated treatment well Patient left: in bed;with call bell/phone within reach (no bed alarm warranted) Nurse Communication:  (neg vestibular eval)      Time: 9096-9077 PT Time Calculation (min) (ACUTE ONLY): 19 min   Charges:   PT Evaluation $PT Eval Low Complexity: 1 Low PT Treatments $Therapeutic Activity: 8-22 mins PT General Charges $$ ACUTE PT VISIT: 1 Visit         Camie CHARLENA Kluver, PT,  DPT 9:54 AM,12/31/23 Physical Therapist - [facility]

## 2023-12-31 NOTE — Progress Notes (Signed)
  Echocardiogram 2D Echocardiogram has been performed. Definity  IV ultrasound imaging agent used on this study.  Jeffery West 12/31/2023, 9:13 AM

## 2023-12-31 NOTE — Discharge Summary (Addendum)
 Physician Discharge Summary   Patient: Jeffery West MRN: [id number] DOB: 02/09/1956  Admit date:     12/30/2023  Discharge date: 12/31/2023  Discharge Physician: Burnard DELENA Cunning   PCP: Sharma Coyer, MD   Recommendations at discharge:   Follow up with Primary Care in 1-2 weeks Consider referral to ENT if left ear symptoms or vertigo persists Follow up regarding any recurrence of vertigo, if meclizine  helpful Repeat CBC, CMP at follow up Recommend vascular surgery referral for Right carotid stenosis  Discharge Diagnoses: Active Problems:   Diabetes mellitus (HCC)   Gout   Hyperlipidemia associated with type 2 diabetes mellitus (HCC)   HTN (hypertension)   Aortic valve stenosis   S/P AVR   Benign prostatic hyperplasia with urinary frequency  Principal Problem (Resolved):   Dizziness  Hospital Course: Jeffery West is a 68 y.o. male with medical history significant of type 2 diabetes, HTN, HLD, aortic stenosis s/p valve replacement, CAD s/o CABGx2 in June 2021, asthma, BPH, gout, chronic back pain who presented to the ED today for evaluation of dizziness associated with nausea vomiting and inability to ambulate.  Pt reports onset this morning as he was arriving to work.  He initially felt mildly unsteady walking in from his vehicle, but as he was beginning work, became progressively dizzy, followed by nausea and vomiting.  He reports going to the restroom, being in their about an hour and a half, and unable to get up to even sit in a chair.  He was too unsteady to stand.  He describes room-spinning if he opened his eyes, so he had to keep them closed.  He otherwise has had no recent illnesses, but does report some sensation of fullness in the left ear and diminished hearing if he covers his right ear.  He denies focal / unilateral weakness numbness or tingling, no difficulty speaking swallowing or understanding others.  No fever/chills, chest pain, shortness of breath, cough,  sore throat, abdominal pain, nausea, vomiting, diarrhea or other recent symptoms.  At the time of my encounter, patient's symptoms had almost fully resolved.  He is able to sit up edge of the stretcher, and can turn head back and forth without recurrent symptoms.  He describes mild intermittent dizziness but never anything this severe.     In the ED, initial vitals temp 98.1 F, HR 71, RR 18, BP 126/66, spO2 95% on room air. Labs were obtained including CMP, CBC and troponin which were notable only for non-fasting glucose 209, BUN 24, lipase mildly elevate 75, normal LFT's and renal function.  WBC mildly elevated at 10.6.  Normal hs-troponin of 5.  EKG without acute ischemic changes.   Stroke evaluation was initiated in the ED.   CT head was non-acute. MRI brain was negative for acute findings, but did show mild chronic microvascular ischemic changes and mild parenchymal volume loss; chronic microhemorrhages in the left cerebellum and left frontal lobe. CTA head/neck showed high-grade stenosis of the posterior genu and posterior horizontal cavernous segment of the right internal carotid artery; mild calcific plaque within the carotid bulbs bilaterally, with no flow limiting stenosis.   I discussed the abpve findings with Neurology, and they do not feel the ICA stenosis  would explain patient's symptoms.  Agreed with admission for observation and further evaluation and formal Neurology consult if indicated.   Patient and wife are agreeable to observation on telemetry and further evaluation of dizziness felt due to either vertigo vs potentially TIA.  Further hospital course and management as outlined below.   7/12 -- pt feels better today, no recurrence of vertigo / dizziness. He is clinically improved, medically stable and agreeable for discharge home today.   Assessment and Plan:  Dizziness -- suspect vertigo/BPPV vs possible TIA but less likely. Stroke ruled out, MRI brain  negative. --Telemetry monitoring showed no events --Echocardiogram EF 6--65%, no WMA's, normal diastolic parameters, no shunt seen --PT for vestibular evaluation -- negative Left Dix-hallpike, bilateral modified Dix-hallpike and bilateral roll test --Recommend considering ENT referral as outpatient --Trial of meclizine  PRN for recurrence --Claritin  & Flonase  for allergies likely contributing to left ear fullness --Recent A1c is 7.4% --Recent lipid panel LDL 64, TG's 146, HDL low 29    Type 2 Diabetes -- A1c 7.4% recently, improved from 8.4% --Resume home regimen at d/c --Covered with sliding scale Novolog  during admission   Asthma - stable, no wheezing --PRN albuterol  nebs   HTN --On losartan /hydrochlorothiazide  and metoprolol    HLD --On Zetia , fenofibrate , Crestor    Gout - no flare symptoms reported -- On allopurinol    Chronic back pain --On tramadol  and flexeril    Right ICA stenosis -- noted on CTA head/neck as part of stroke evaluation. --Recommend vascular surgery referral as outpatient       Consultants: None Procedures performed: None  Disposition: Home Diet recommendation:  Cardiac and Carb modified diet DISCHARGE MEDICATION: Allergies as of 12/31/2023       Reactions   Penicillins Hives, Swelling, Other (See Comments)   Has patient had a PCN reaction causing immediate rash, facial/tongue/throat swelling, SOB or lightheadedness with hypotension: Unknown Has patient had a PCN reaction causing severe rash involving mucus membranes or skin necrosis: No Has patient had a PCN reaction that required hospitalization: no Has patient had a PCN reaction occurring within the last 10 years: No If all of the above answers are NO, then may proceed with Cephalosporin use.        Medication List     STOP taking these medications    sildenafil  20 MG tablet Commonly known as: REVATIO        TAKE these medications    acetaminophen  500 MG tablet Commonly known  as: TYLENOL  Take 1,000 mg by mouth every 6 (six) hours as needed for moderate pain or headache.   albuterol  108 (90 Base) MCG/ACT inhaler Commonly known as: VENTOLIN  HFA INHALE 1 PUFF INTO THE LUNGS EVERY 6 HOURS AS NEEDED FOR WHEEZING OR SHORTNESS OF BREATH.   allopurinol  300 MG tablet Commonly known as: ZYLOPRIM  TAKE 1 TABLET BY MOUTH EVERY DAY   aspirin  EC 81 MG tablet Take 81 mg by mouth daily. Swallow whole.   BD Pen Needle Nano 2nd Gen 32G X 4 MM Misc Generic drug: Insulin  Pen Needle USE AS DIRECTED   cyclobenzaprine  5 MG tablet Commonly known as: FLEXERIL  Take 1 tablet (5 mg total) by mouth at bedtime.   empagliflozin  25 MG Tabs tablet Commonly known as: Jardiance  Take 1 tablet (25 mg total) by mouth daily.   ezetimibe  10 MG tablet Commonly known as: ZETIA  Take 1 tablet (10 mg total) by mouth daily.   fenofibrate  48 MG tablet Commonly known as: TRICOR  TAKE 1 TABLET BY MOUTH EVERY DAY   fluticasone  50 MCG/ACT nasal spray Commonly known as: FLONASE  Place 1 spray into both nostrils daily.   icosapent  Ethyl 1 g capsule Commonly known as: VASCEPA  TAKE 2 CAPSULES BY MOUTH TWICE A DAY   loratadine  10 MG tablet Commonly known as:  CLARITIN  Take 1 tablet (10 mg total) by mouth daily.   losartan -hydrochlorothiazide  50-12.5 MG tablet Commonly known as: HYZAAR TAKE 1 TABLET BY MOUTH ONCE DAILY   magnesium  oxide 400 (240 Mg) MG tablet Commonly known as: MAG-OX TAKE 1 TABLET BY MOUTH TWICE A DAY   meclizine  25 MG tablet Commonly known as: ANTIVERT  Take 1 tablet (25 mg total) by mouth 3 (three) times daily as needed for dizziness.   metFORMIN  1000 MG tablet Commonly known as: GLUCOPHAGE  TAKE 1 TABLET (1,000 MG TOTAL) BY MOUTH TWICE A DAY WITH FOOD   metoprolol  tartrate 25 MG tablet Commonly known as: LOPRESSOR  TAKE 1 TABLET BY MOUTH TWICE A DAY   nystatin  cream Commonly known as: MYCOSTATIN  APPLY TO AFFECTED AREA TWICE A DAY   OneTouch Delica Lancets 30G  Misc Use 3 times a day   OneTouch Verio test strip Generic drug: glucose blood Use as instructed; BID   oxybutynin  5 MG tablet Commonly known as: DITROPAN  TAKE 1 TABLET BY MOUTH 3 TIMES DAILY   rosuvastatin  5 MG tablet Commonly known as: CRESTOR  TAKE 1 TABLET BY MOUTH EVERY DAY   Semaglutide  (2 MG/DOSE) 8 MG/3ML Sopn Inject 2 mg as directed once a week.   traMADol  50 MG tablet Commonly known as: ULTRAM  Take 50 mg by mouth every 6 (six) hours as needed.   Tresiba  FlexTouch 100 UNIT/ML FlexTouch Pen Generic drug: insulin  degludec INJECT 30 UNITS SUBCUTANEOUSLY ONCE DAILY AT 10:00PM   Vitamin D  50 MCG (2000 UT) Caps Take 2,000 Units by mouth every evening.        Discharge Exam: Filed Weights   12/30/23 0910  Weight: 103.9 kg   General exam: awake, alert, no acute distress HEENT: atraumatic, clear conjunctiva, anicteric sclera, moist mucus membranes, hearing grossly normal  Respiratory system: CTAB, no wheezes, rales or rhonchi, normal respiratory effort. Cardiovascular system: normal S1/S2, RRR, no JVD, murmurs, rubs, gallops,  no pedal edema.   Gastrointestinal system: soft, NT, ND, no HSM felt, +bowel sounds. Central nervous system: A&O x4. no Viveros focal neurologic deficits, normal speech Extremities: moves all , no edema, normal tone Skin: dry, intact, normal temperature, normal color, No rashes, lesions or ulcers Psychiatry: normal mood, congruent affect, judgement and insight appear normal   Condition at discharge: stable  The results of significant diagnostics from this hospitalization (including imaging, microbiology, ancillary and laboratory) are listed below for reference.   Imaging Studies: ECHOCARDIOGRAM COMPLETE Result Date: 12/31/2023    ECHOCARDIOGRAM REPORT   Patient Name:   KASCH BORQUEZ Date of Exam: 12/31/2023 Medical Rec #:  [id number]     Height:       71.0 in Accession #:    [id number]    Weight:       229.0 lb Date of Birth:  [birth year]      BSA:          2.233 m Patient Age:    67 years      BP:           114/66 mmHg Patient Gender: M             HR:           77 bpm. Exam Location:  [facility] Procedure: 2D Echo, Cardiac Doppler, Color Doppler and Intracardiac            Opacification Agent (Both Spectral and Color Flow Doppler were            utilized during procedure). Indications:  Other abnormalities of the heart R00.8  History:         Patient has prior history of Echocardiogram examinations, most                  recent 12/31/2020.                  Aortic Valve: bioprosthetic valve is present in the aortic                  position.  Sonographer:     Thedora Louder RDCS, FASE Referring Phys:  [id number] [facility] Duncan Alejandro Diagnosing Phys: Redell Cave MD  Sonographer Comments: Technically difficult study due to poor echo windows, suboptimal apical window and no subcostal window. Image acquisition challenging due to patient body habitus. IMPRESSIONS  1. Left ventricular ejection fraction, by estimation, is 60 to 65%. The left ventricle has normal function. The left ventricle has no regional wall motion abnormalities. There is mild left ventricular hypertrophy. Left ventricular diastolic parameters were normal.  2. Right ventricular systolic function is normal. The right ventricular size is normal.  3. The mitral valve is normal in structure. No evidence of mitral valve regurgitation.  4. The aortic valve has been repaired/replaced. Aortic valve regurgitation is not visualized. There is a bioprosthetic valve present in the aortic position. Aortic valve mean gradient measures 8.0 mmHg. FINDINGS  Left Ventricle: Left ventricular ejection fraction, by estimation, is 60 to 65%. The left ventricle has normal function. The left ventricle has no regional wall motion abnormalities. Definity  contrast agent was given IV to delineate the left ventricular  endocardial borders. The left ventricular internal cavity size was normal in size. There is mild left  ventricular hypertrophy. Left ventricular diastolic parameters were normal. Right Ventricle: The right ventricular size is normal. No increase in right ventricular wall thickness. Right ventricular systolic function is normal. Left Atrium: Left atrial size was normal in size. Right Atrium: Right atrial size was normal in size. Pericardium: There is no evidence of pericardial effusion. Mitral Valve: The mitral valve is normal in structure. No evidence of mitral valve regurgitation. Tricuspid Valve: The tricuspid valve is normal in structure. Tricuspid valve regurgitation is not demonstrated. Aortic Valve: The aortic valve has been repaired/replaced. Aortic valve regurgitation is not visualized. Aortic valve mean gradient measures 8.0 mmHg. Aortic valve peak gradient measures 15.5 mmHg. Aortic valve area, by VTI measures 1.84 cm. There is a bioprosthetic valve present in the aortic position. Pulmonic Valve: The pulmonic valve was not well visualized. Pulmonic valve regurgitation is not visualized. Aorta: The aortic root and ascending aorta are structurally normal, with no evidence of dilitation. IAS/Shunts: No atrial level shunt detected by color flow Doppler.  LEFT VENTRICLE PLAX 2D LVIDd:         4.80 cm     Diastology LVIDs:         3.15 cm     LV e' medial:    7.29 cm/s LV PW:         1.10 cm     LV E/e' medial:  9.7 LV IVS:        1.05 cm     LV e' lateral:   12.20 cm/s LVOT diam:     1.80 cm     LV E/e' lateral: 5.8 LV SV:         65 LV SV Index:   29 LVOT Area:     2.54 cm  LV Volumes (MOD) LV vol d, MOD  A4C: 76.0 ml LV vol s, MOD A4C: 23.5 ml LV SV MOD A4C:     76.0 ml RIGHT VENTRICLE RV Basal diam:  3.50 cm TAPSE (M-mode): 1.7 cm LEFT ATRIUM           Index LA diam:      4.30 cm 1.93 cm/m LA Vol (A4C): 18.2 ml 8.15 ml/m  AORTIC VALVE                     PULMONIC VALVE AV Area (Vmax):    1.63 cm      PV Vmax:        1.19 m/s AV Area (Vmean):   1.73 cm      PV Peak grad:   5.7 mmHg AV Area (VTI):     1.84  cm      RVOT Peak grad: 5 mmHg AV Vmax:           197.00 cm/s AV Vmean:          127.000 cm/s AV VTI:            0.353 m AV Peak Grad:      15.5 mmHg AV Mean Grad:      8.0 mmHg LVOT Vmax:         126.00 cm/s LVOT Vmean:        86.100 cm/s LVOT VTI:          0.255 m LVOT/AV VTI ratio: 0.72  AORTA Ao Root diam: 3.10 cm Ao Asc diam:  3.10 cm MITRAL VALVE MV Area (PHT): 3.54 cm    SHUNTS MV Decel Time: 214 msec    Systemic VTI:  0.26 m MV E velocity: 70.70 cm/s  Systemic Diam: 1.80 cm MV A velocity: 98.50 cm/s MV E/A ratio:  0.72 Redell Cave MD Electronically signed by Redell Cave MD Signature Date/Time: 12/31/2023/11:58:07 AM    Final    CT Angio Head Neck W WO CM Result Date: 12/30/2023 CLINICAL DATA:  Transient ischemic attack (TIA) Vertigo, central EXAM: CT ANGIOGRAPHY HEAD AND NECK WITH AND WITHOUT CONTRAST TECHNIQUE: Multidetector CT imaging of the head and neck was performed using the standard protocol during bolus administration of intravenous contrast. Multiplanar CT image reconstructions and MIPs were obtained to evaluate the vascular anatomy. Carotid stenosis measurements (when applicable) are obtained utilizing NASCET criteria, using the distal internal carotid diameter as the denominator. RADIATION DOSE REDUCTION: This exam was performed according to the departmental dose-optimization program which includes automated exposure control, adjustment of the mA and/or kV according to patient size and/or use of iterative reconstruction technique. CONTRAST:  75mL OMNIPAQUE  IOHEXOL  350 MG/ML SOLN COMPARISON:  CT the head dated December 30, 2023. FINDINGS: CTA NECK FINDINGS Aortic arch: Mild calcific plaque. Standard 3 vessel takeoff of the great arteries. The brachiocephalic artery is widely patent. Right carotid system: The common carotid artery is normal in caliber. There is mild calcific plaque within the carotid bulb and origin of the internal carotid artery, but there is no appreciable stenosis.  The remainder of the cervical segment of the internal carotid artery is unremarkable. Left carotid system: The common carotid artery is normal in caliber and demonstrates mild atheromatous disease. There is calcified plaque within the carotid bulb and origin of the internal carotid artery, but no appreciable stenosis. Vertebral arteries: The vertebral arteries are codominant and normal in caliber throughout the neck. Skeleton: Mild to moderate degenerative changes within the cervical spine. No osseous lesions. Other neck: Negative. Upper chest:  The lung apices are clear. Review of the MIP images confirms the above findings CTA HEAD FINDINGS Anterior circulation: There is calcific plaque with severe stenosis of the posterior genu and horizontal cavernous segment of the right internal carotid artery. There is mild-to-moderate stenosis of the ophthalmic segment. There is mild stenosis of the of Thal mid segment of the left internal carotid artery. The anterior and middle cerebral arteries and their proximal branches demonstrate no evidence of flow-limiting stenosis or aneurysm. Posterior circulation: There is mild calcific plaque within the V4 segments of the vertebral arteries, without flow-limiting stenosis. The basilar artery is unremarkable. The posterior cerebral arteries and the cerebellar arteries appear normal in caliber. No evidence of aneurysm, vessel occlusion or flow-limiting stenosis. Venous sinuses: Patent. Anatomic variants: None. Review of the MIP images confirms the above findings IMPRESSION: 1. High-grade stenosis of the posterior genu and posterior horizontal cavernous segment of the right internal carotid artery. 2. Mild calcific plaque within the carotid bulbs bilaterally, with no flow limiting stenosis. Electronically Signed   By: Evalene Coho M.D.   On: 12/30/2023 16:21   MR BRAIN WO CONTRAST Result Date: 12/30/2023 CLINICAL DATA:  Transient ischemic attack, vertigo, nausea and vomiting.  EXAM: MRI HEAD WITHOUT CONTRAST TECHNIQUE: Multiplanar, multiecho pulse sequences of the brain and surrounding structures were obtained without intravenous contrast. COMPARISON:  Earlier same day head CT. FINDINGS: Brain: No acute infarct. Mild parenchymal volume loss. T2/FLAIR hyperintensity in the periventricular and subcortical white matter. Chronic microhemorrhages in the left cerebellum and in the left frontal lobe. No edema, mass effect, or midline shift. Normal appearance of midline structures. The basilar cisterns are patent. No extra-axial fluid collections. Ventricles: Normal size and configuration of the ventricles. Vascular: Skull base flow voids are visualized. Skull and upper cervical spine: No focal abnormality. Sinuses/Orbits: Orbits are symmetric. Paranasal sinuses are clear. Other: Trace fluid in the left mastoid tip. IMPRESSION: No acute intracranial abnormality. Mild chronic microvascular ischemic changes and mild parenchymal volume loss. Chronic microhemorrhages in the left cerebellum and left frontal lobe. Electronically Signed   By: Donnice Mania M.D.   On: 12/30/2023 15:09   CT Head Wo Contrast Result Date: 12/30/2023 CLINICAL DATA:  Provided history: Neuro deficit, acute, stroke suspected. Nausea/vomiting. EXAM: CT HEAD WITHOUT CONTRAST TECHNIQUE: Contiguous axial images were obtained from the base of the skull through the vertex without intravenous contrast. RADIATION DOSE REDUCTION: This exam was performed according to the departmental dose-optimization program which includes automated exposure control, adjustment of the mA and/or kV according to patient size and/or use of iterative reconstruction technique. COMPARISON:  None. FINDINGS: Brain: Mild generalized cerebral atrophy. Cavum septum pellucidum and cavum vergae (anatomic variant). There is no acute intracranial hemorrhage. No demarcated cortical infarct. No extra-axial fluid collection. No evidence of an intracranial mass. No  midline shift. Vascular: No hyperdense vessel.  Atherosclerotic calcifications. Skull: No calvarial fracture or aggressive osseous lesion. Sinuses/Orbits: No mass or acute finding within the imaged orbits. No significant paranasal sinus disease at the imaged levels. IMPRESSION: 1. No evidence of an acute intracranial abnormality. 2. Mild generalized cerebral atrophy. Electronically Signed   By: Rockey Childs D.O.   On: 12/30/2023 13:04    Microbiology: Results for orders placed or performed during the hospital encounter of 11/22/19  Surgical pcr screen     Status: Abnormal   Collection Time: 11/22/19 11:07 AM   Specimen: Nasal Mucosa; Nasal Swab  Result Value Ref Range Status   MRSA, PCR NEGATIVE NEGATIVE Final  Staphylococcus aureus POSITIVE (A) NEGATIVE Final    Comment: (NOTE) The Xpert SA Assay (FDA approved for NASAL specimens in patients 5 years of age and older), is one component of a comprehensive surveillance program. It is not intended to diagnose infection nor to guide or monitor treatment. Performed at [facility], [address], [place], [place] [address]     Labs: CBC: No results for input(s): WBC, NEUTROABS, HGB, HCT, MCV, PLT in the last 168 hours.  Basic Metabolic Panel: No results for input(s): NA, K, CL, CO2, GLUCOSE, BUN, CREATININE, CALCIUM , MG, PHOS in the last 168 hours.  Liver Function Tests: No results for input(s): AST, ALT, ALKPHOS, BILITOT, PROT, ALBUMIN  in the last 168 hours.  CBG: No results for input(s): GLUCAP in the last 168 hours.   Discharge time spent: less than 30 minutes.  Signed: Burnard DELENA Cunning, DO [facility] 01/16/2024

## 2024-01-02 ENCOUNTER — Telehealth: Payer: Self-pay

## 2024-01-02 NOTE — Transitions of Care (Post Inpatient/ED Visit) (Signed)
 01/02/2024  Name: Jeffery West MRN: [id number] DOB: Mar 21, 1956  Today's TOC FU Call Status: Today's TOC FU Call Status:: Successful TOC FU Call Completed TOC FU Call Complete Date: 01/02/24 Patient's Name and Date of Birth confirmed.  Transition Care Management Follow-up Telephone Call Date of Discharge: 12/31/23 Discharge Facility: [facility]) Type of Discharge: Inpatient Admission Primary Inpatient Discharge Diagnosis:: n/v dizzines How have you been since you were released from the hospital?: Better Any questions or concerns?: No  Items Reviewed: Did you receive and understand the discharge instructions provided?: Yes Medications obtained,verified, and reconciled?: Yes (Medications Reviewed) Any new allergies since your discharge?: No Dietary orders reviewed?: Yes Do you have support at home?: Yes People in Home [RPT]: spouse  Medications Reviewed Today: Medications Reviewed Today     Reviewed by Jeffery Pan, LPN (Licensed Practical Nurse) on 01/02/24 at 1437  Med List Status: <None>   Medication Order Taking? Sig Documenting Provider Last Dose Status Informant  acetaminophen  (TYLENOL ) 500 MG tablet [id number] Yes Take 1,000 mg by mouth every 6 (six) hours as needed for moderate pain or headache. [provider]  Active Self  albuterol  (VENTOLIN  HFA) 108 (90 Base) MCG/ACT inhaler [id number] Yes INHALE 1 PUFF INTO THE LUNGS EVERY 6 HOURS AS NEEDED FOR WHEEZING OR SHORTNESS OF BREATH. Bertrum Charlie LITTIE, MD  Active Self  allopurinol  (ZYLOPRIM ) 300 MG tablet [id number] Yes TAKE 1 TABLET BY MOUTH EVERY DAY Simmons-Robinson, Makiera, MD  Active Self  aspirin  EC 81 MG tablet [id number] Yes Take 81 mg by mouth daily. Swallow whole. [provider]  Active Self  Cholecalciferol  (VITAMIN D ) 2000 units CAPS [id number] Yes Take 2,000 Units by mouth every evening.  [provider]  Active Self  cyclobenzaprine  (FLEXERIL ) 5 MG tablet  [id number] Yes Take 1 tablet (5 mg total) by mouth at bedtime. Simmons-Robinson, Makiera, MD  Active Self  empagliflozin  (JARDIANCE ) 25 MG TABS tablet [id number] Yes Take 1 tablet (25 mg total) by mouth daily. Simmons-Robinson, Makiera, MD  Active Self  ezetimibe  (ZETIA ) 10 MG tablet [id number] Yes Take 1 tablet (10 mg total) by mouth daily. Simmons-Robinson, Makiera, MD  Active Self  fenofibrate  (TRICOR ) 48 MG tablet [id number] Yes TAKE 1 TABLET BY MOUTH EVERY DAY Simmons-Robinson, Makiera, MD  Active Self  fluticasone  (FLONASE ) 50 MCG/ACT nasal spray [id number] Yes Place 1 spray into both nostrils daily. Fausto Sor A, DO  Active   glucose blood (ONETOUCH VERIO) test strip [id number] Yes Use as instructed; BID Bertrum Charlie LITTIE, MD  Active Self  icosapent  Ethyl (VASCEPA ) 1 g capsule [id number] Yes TAKE 2 CAPSULES BY MOUTH TWICE A DAY Simmons-Robinson, Makiera, MD  Active Self  Insulin  Pen Needle (BD PEN NEEDLE NANO 2ND GEN) 32G X 4 MM MISC [id number] Yes USE AS DIRECTED Simmons-Robinson, Makiera, MD  Active Self  loratadine  (CLARITIN  REDITABS) 10 MG dissolvable tablet [id number] Yes Take 10 mg by mouth daily. [provider]  Active   loratadine  (CLARITIN ) 10 MG tablet [id number] Yes Take 1 tablet (10 mg total) by mouth daily. Fausto Sor LABOR, DO  Active   losartan -hydrochlorothiazide  (HYZAAR) 50-12.5 MG tablet [id number] Yes TAKE 1 TABLET BY MOUTH ONCE DAILY Simmons-Robinson, Makiera, MD  Active Self  magnesium  oxide (MAG-OX) 400 (240 Mg) MG tablet [id number] Yes TAKE 1 TABLET BY MOUTH TWICE A DAY Bertrum Charlie LITTIE, MD  Active Self  meclizine  (ANTIVERT ) 25 MG tablet [id number] Yes Take 1 tablet (25 mg total) by mouth 3 (three) times daily as needed  for dizziness. Fausto Sor A, DO  Active   metFORMIN  (GLUCOPHAGE ) 1000 MG tablet [id number] Yes TAKE 1 TABLET (1,000 MG TOTAL) BY MOUTH TWICE A DAY WITH FOOD Simmons-Robinson, Makiera, MD  Active Self  metoprolol  tartrate (LOPRESSOR ) 25 MG tablet  [id number] Yes TAKE 1 TABLET BY MOUTH TWICE A DAY Gollan, Timothy J, MD  Active Self  nystatin  cream (MYCOSTATIN ) [id number] Yes APPLY TO AFFECTED AREA TWICE A DAY Simmons-Robinson, Rockie, MD  Active Self  OneTouch Delica Lancets 30G MISC [id number] Yes Use 3 times a day Bertrum Charlie CROME, MD  Active Self  oxybutynin  (DITROPAN ) 5 MG tablet [id number] Yes TAKE 1 TABLET BY MOUTH 3 TIMES DAILY Simmons-Robinson, Makiera, MD  Active Self  rosuvastatin  (CRESTOR ) 5 MG tablet [id number] Yes TAKE 1 TABLET BY MOUTH EVERY DAY Gollan, Timothy J, MD  Active Self  Semaglutide , 2 MG/DOSE, 8 MG/3ML SOPN [id number] Yes Inject 2 mg as directed once a week. Simmons-Robinson, Makiera, MD  Active Self  traMADol  (ULTRAM ) 50 MG tablet [id number] Yes Take 50 mg by mouth every 6 (six) hours as needed. [provider]  Active Self  TRESIBA  FLEXTOUCH 100 UNIT/ML FlexTouch Pen [id number] Yes INJECT 30 UNITS SUBCUTANEOUSLY ONCE DAILY AT 10:00PM Simmons-Robinson, Makiera, MD  Active Self            Home Care and Equipment/Supplies: Were Home Health Services Ordered?: NA Any new equipment or medical supplies ordered?: NA  Functional Questionnaire: Do you need assistance with bathing/showering or dressing?: No Do you need assistance with meal preparation?: No Do you need assistance with eating?: No Do you have difficulty maintaining continence: No Do you need assistance with getting out of bed/getting out of a chair/moving?: No Do you have difficulty managing or taking your medications?: No  Follow up appointments reviewed: PCP Follow-up appointment confirmed?: NA Specialist Hospital Follow-up appointment confirmed?: NA Do you need transportation to your follow-up appointment?: No Do you understand care options if your condition(s) worsen?: Yes-patient verbalized understanding    SIGNATURE Julian Lemmings, LPN [facility] Nurse Health Advisor Direct Dial [phone]

## 2024-01-16 ENCOUNTER — Encounter: Payer: Self-pay | Admitting: Internal Medicine

## 2024-01-23 ENCOUNTER — Ambulatory Visit: Payer: Self-pay

## 2024-01-23 DIAGNOSIS — H9193 Unspecified hearing loss, bilateral: Secondary | ICD-10-CM

## 2024-01-23 DIAGNOSIS — R42 Dizziness and giddiness: Secondary | ICD-10-CM

## 2024-01-23 NOTE — Telephone Encounter (Signed)
 Referral submitted for ENT as requested by patient via phone call   Recommend office visit or virtual visit if symptoms worsen or if medication needed

## 2024-01-23 NOTE — Telephone Encounter (Signed)
 FYI Only or Action Required?: Action required by provider: referral request.  Patient was last seen in primary care on 12/19/2023 by Sharma Coyer, MD.  Called Nurse Triage reporting Dizziness.  Symptoms began several weeks ago.  Interventions attempted: Rest, hydration, or home remedies.  Symptoms are: unchanged. States PCP made referral to ENT last year, but he never went. Asking for referral again and to see ENT today. Please advise pt. Declines OV.  Triage Disposition: See PCP When Office is Open (Within 3 Days)  Patient/caregiver understands and will follow disposition?: Yes   Copied from CRM #[id number]. Topic: Clinical - Red Word Triage >> Jan 23, 2024  8:30 AM Suzen RAMAN wrote: Red Word that prompted transfer to Nurse Triage: Dizziness and nausea(possible due to vertigo) would like to see ENT [place].. referral placed 08/2022 Reason for Disposition  [1] MODERATE dizziness (e.g., interferes with normal activities) AND [2] has been evaluated by doctor (or NP/PA) for this  Answer Assessment - Initial Assessment Questions Seen in ED 12/30/23, Did not find anything wrong.  1. DESCRIPTION: Describe your dizziness.     Dizzy 2. LIGHTHEADED: Do you feel lightheaded? (e.g., somewhat faint, woozy, weak upon standing)     Yes 3. VERTIGO: Do you feel like either you or the room is spinning or tilting? (i.e., vertigo)     yes 4. SEVERITY: How bad is it?  Do you feel like you are going to faint? Can you stand and walk?     no 5. ONSET:  When did the dizziness begin?     July 6. AGGRAVATING FACTORS: Does anything make it worse? (e.g., standing, change in head position)     no 7. HEART RATE: Can you tell me your heart rate? How many beats in 15 seconds?  (Note: Not all patients can do this.)       no 8. CAUSE: What do you think is causing the dizziness? (e.g., decreased fluids or food, diarrhea, emotional distress, heat exposure, new medicine, sudden  standing, vomiting; unknown)     no 9. RECURRENT SYMPTOM: Have you had dizziness before? If Yes, ask: When was the last time? What happened that time?     yes 10. OTHER SYMPTOMS: Do you have any other symptoms? (e.g., fever, chest pain, vomiting, diarrhea, bleeding)       Ear muted 11. PREGNANCY: Is there any chance you are pregnant? When was your last menstrual period?       N/a  Protocols used: Dizziness - Lightheadedness-A-AH

## 2024-01-27 NOTE — Progress Notes (Signed)
 Cardiology Office Note  Date:  01/30/2024   ID:  Jeffery West, DOB Jun 30, 1955, MRN [id number]  PCP:  Sharma Coyer, MD   Chief Complaint  Patient presents with   Follow-up    Coronary artery disease of native artery of native heart with stable angina pectoris, patient denies any dizziness or light headedness.    HPI:  Jeffery West is a 68 year old gentleman with past medical history of Diabetes   elevated hemoglobin A1c 9 No significant smoking hx HTN Hyperlipidemia Morbid obesity  aortic valve stenosis  on echocardiogram June 2017 Who presents for follow-up of his coronary disease, aortic valve replacement with CABG x2 in 11/26/2019.  Last seen in clinic by myself 7/24 Weight 261 in 5/22 Weight 7/24: 243 Weight today : 223  In the ER 12/30/23 Vertigo, nausea,  Given meclizine  CT neck: High-grade stenosis of the posterior genu and posterior horizontal cavernous segment of the right internal carotid artery.  On Ozempic , additional 20 pound weight loss since last year  Continues to work in [employer], Denies chest pain concerning for angina No regular exercise program  Lab work reviewed A1C 7.4 with weight loss, trending down Total cholesterol 119, LDL 64  Echocardiogram July 2022, normal ejection fraction, stable prosthetic aortic valve  EKG personally reviewed by myself on todays visit EKG Interpretation Date/Time:  Monday January 30 2024 10:24:56 EDT Ventricular Rate:  83 PR Interval:  204 QRS Duration:  102 QT Interval:  356 QTC Calculation: 418 R Axis:   27  Text Interpretation: Normal sinus rhythm Low voltage QRS Incomplete right bundle branch block When compared with ECG of 30-Dec-2023 15:22, Sinus rhythm has replaced Ectopic atrial rhythm Confirmed by Perla Lye [id number]) on 01/30/2024 10:44:14 AM   Cardiac work-up   Echo 09/28/2019 showed increase in aortic valve mean gradient of 53.5 mmHg, LVEF 60 to 65%, grade 1 diastolic dysfunction.     Cardiac catheterization 10/19/2019 showed 80% stenosis to mid LAD, 70% first diagonal stenosis.   Underwent coronary bypass graft surgery and bioprosthetic aortic valve replacement AVR (23mm Edwards Inspiris Resilia pericardial valve) and CABGx2 (LIMA-LAD, SVG-first diagonal) 11/26/2019.   PMH:   has a past medical history of Coronary artery disease, Diabetes mellitus without complication (HCC), Dizziness (12/30/2023), Erectile dysfunction, Gout, Heart murmur, Hyperlipidemia, Hypertension, Obesity, Osteoarthritis, and Vitamin D  deficiency.  PSH:    Past Surgical History:  Procedure Laterality Date   AORTIC VALVE REPLACEMENT N/A 11/26/2019   Procedure: AORTIC VALVE REPLACEMENT (AVR) USING INSPIRIS AORTIC VALVE SIZE ;  Surgeon: Lucas Dorise POUR, MD;  [facility];  Service: Open Heart Surgery;  Laterality: N/A;   CARDIAC CATHETERIZATION     COLONOSCOPY WITH PROPOFOL  N/A 08/24/2019   Procedure: COLONOSCOPY WITH PROPOFOL ;  Surgeon: Jinny Carmine, MD;  Location: [facility] ENDOSCOPY;  Service: Endoscopy;  Laterality: N/A;   CORONARY ARTERY BYPASS GRAFT N/A 11/26/2019   Procedure: CORONARY ARTERY BYPASS GRAFTING (CABG) X Two , using left internal mammary artery and right leg greater saphenous vein harvested endoscopically.;  Surgeon: Lucas Dorise POUR, MD;  Location: [facility] OR;  Service: Open Heart Surgery;  Laterality: N/A;   LEFT HEART CATH AND CORONARY ANGIOGRAPHY Left 10/19/2019   Procedure: LEFT HEART CATH AND CORONARY ANGIOGRAPHY;  Surgeon: Perla Lye PARAS, MD;  Location: [facility] INVASIVE CV LAB;  Service: Cardiovascular;  Laterality: Left;   NO PAST SURGERIES     RIGHT HEART CATH N/A 10/19/2019   Procedure: RIGHT HEART CATH;  Surgeon: Perla Lye PARAS, MD;  Location: [facility] INVASIVE CV  LAB;  Service: Cardiovascular;  Laterality: N/A;   SHOULDER ARTHROSCOPY WITH OPEN ROTATOR CUFF REPAIR Right 07/26/2017   Procedure: SHOULDER ARTHROSCOPY WITH OPEN ROTATOR CUFF REPAIR,SUBACROMINAL DECOMPRESSION,DISTAL CLAVICLE  EXCISION;  Surgeon: Marchia Drivers, MD;  Location: [facility] ORS;  Service: Orthopedics;  Laterality: Right;   TEE WITHOUT CARDIOVERSION N/A 11/26/2019   Procedure: TRANSESOPHAGEAL ECHOCARDIOGRAM (TEE);  Surgeon: Lucas Dorise POUR, MD;  Location: [facility] OR;  Service: Open Heart Surgery;  Laterality: N/A;    Current Outpatient Medications  Medication Sig Dispense Refill   acetaminophen  (TYLENOL ) 500 MG tablet Take 1,000 mg by mouth every 6 (six) hours as needed for moderate pain or headache.     albuterol  (VENTOLIN  HFA) 108 (90 Base) MCG/ACT inhaler INHALE 1 PUFF INTO THE LUNGS EVERY 6 HOURS AS NEEDED FOR WHEEZING OR SHORTNESS OF BREATH. (Patient taking differently: as needed.) 8.5 each 3   allopurinol  (ZYLOPRIM ) 300 MG tablet TAKE 1 TABLET BY MOUTH EVERY DAY 90 tablet 2   aspirin  EC 81 MG tablet Take 81 mg by mouth daily. Swallow whole.     Cholecalciferol  (VITAMIN D ) 2000 units CAPS Take 2,000 Units by mouth every evening.      cyclobenzaprine  (FLEXERIL ) 5 MG tablet Take 1 tablet (5 mg total) by mouth at bedtime. 30 tablet 3   empagliflozin  (JARDIANCE ) 25 MG TABS tablet Take 1 tablet (25 mg total) by mouth daily. 90 tablet 3   ezetimibe  (ZETIA ) 10 MG tablet Take 1 tablet (10 mg total) by mouth daily. 90 tablet 3   fenofibrate  (TRICOR ) 48 MG tablet TAKE 1 TABLET BY MOUTH EVERY DAY 90 tablet 1   glucose blood (ONETOUCH VERIO) test strip Use as instructed; BID 100 each 12   icosapent  Ethyl (VASCEPA ) 1 g capsule TAKE 2 CAPSULES BY MOUTH TWICE A DAY 360 capsule 3   Insulin  Pen Needle (BD PEN NEEDLE NANO 2ND GEN) 32G X 4 MM MISC USE AS DIRECTED 100 each 2   loratadine  (CLARITIN ) 10 MG tablet Take 1 tablet (10 mg total) by mouth daily.     losartan -hydrochlorothiazide  (HYZAAR) 50-12.5 MG tablet TAKE 1 TABLET BY MOUTH ONCE DAILY 90 tablet 3   magnesium  oxide (MAG-OX) 400 (240 Mg) MG tablet TAKE 1 TABLET BY MOUTH TWICE A DAY 180 tablet 3   meclizine  (ANTIVERT ) 25 MG tablet Take 1 tablet (25 mg total) by mouth 3  (three) times daily as needed for dizziness. 30 tablet 0   metFORMIN  (GLUCOPHAGE ) 1000 MG tablet TAKE 1 TABLET (1,000 MG TOTAL) BY MOUTH TWICE A DAY WITH FOOD 180 tablet 3   metoprolol  tartrate (LOPRESSOR ) 25 MG tablet TAKE 1 TABLET BY MOUTH TWICE A DAY 70 tablet 0   nystatin  cream (MYCOSTATIN ) APPLY TO AFFECTED AREA TWICE A DAY 30 g 2   OneTouch Delica Lancets 30G MISC Use 3 times a day 100 each 12   oxybutynin  (DITROPAN ) 5 MG tablet TAKE 1 TABLET BY MOUTH 3 TIMES DAILY 270 tablet 0   rosuvastatin  (CRESTOR ) 5 MG tablet TAKE 1 TABLET BY MOUTH EVERY DAY 30 tablet 6   Semaglutide , 2 MG/DOSE, 8 MG/3ML SOPN Inject 2 mg as directed once a week. 3 mL 1   traMADol  (ULTRAM ) 50 MG tablet Take 50 mg by mouth every 6 (six) hours as needed.     TRESIBA  FLEXTOUCH 100 UNIT/ML FlexTouch Pen INJECT 30 UNITS SUBCUTANEOUSLY ONCE DAILY AT 10:00PM 9 mL 3   fluticasone  (FLONASE ) 50 MCG/ACT nasal spray Place 1 spray into both nostrils daily. (Patient not taking: Reported  on 01/30/2024)     loratadine  (CLARITIN  REDITABS) 10 MG dissolvable tablet Take 10 mg by mouth daily. (Patient not taking: Reported on 01/30/2024)     No current facility-administered medications for this visit.    Allergies:   Penicillins   Social History:  The patient  reports that he has never smoked. He has never used smokeless tobacco. He reports current alcohol use. He reports that he does not use drugs.   Family History:   family history includes Asthma in his mother; Cancer in his father; Hypertension in his brother.    Review of Systems: Review of Systems  Constitutional: Negative.   HENT: Negative.    Respiratory: Negative.    Cardiovascular: Negative.   Gastrointestinal: Negative.   Musculoskeletal: Negative.   Neurological: Negative.   Psychiatric/Behavioral: Negative.    All other systems reviewed and are negative.   PHYSICAL EXAM: VS:  BP (!) 90/52 (BP Location: Right Arm, Patient Position: Sitting, Cuff Size: Normal)    Ht 5' 11 (1.803 m)   Wt 223 lb 9.6 oz (101.4 kg)   SpO2 96%   BMI 31.19 kg/m  , BMI Body mass index is 31.19 kg/m. Constitutional:  oriented to person, place, and time. No distress.  HENT:  Head: Grossly normal Eyes:  no discharge. No scleral icterus.  Neck: No JVD, no carotid bruits  Cardiovascular: Regular rate and rhythm, no murmurs appreciated Pulmonary/Chest: Clear to auscultation bilaterally, no wheezes or rails Abdominal: Soft.  no distension.  no tenderness.  Musculoskeletal: Normal range of motion Neurological:  normal muscle tone. Coordination normal. No atrophy Skin: Skin warm and dry Psychiatric: normal affect, pleasant  Recent Labs: 12/30/2023: ALT 17; BUN 24; Creatinine, Ser 1.14; Hemoglobin 15.0; Platelets 123; Potassium 3.9; Sodium 137   Lipid Panel Lab Results  Component Value Date   CHOL 119 12/19/2023   HDL 29 (L) 12/19/2023   LDLCALC 64 12/19/2023   TRIG 146 12/19/2023     Wt Readings from Last 3 Encounters:  01/30/24 223 lb 9.6 oz (101.4 kg)  12/30/23 229 lb (103.9 kg)  12/19/23 229 lb (103.9 kg)     ASSESSMENT AND PLAN:  Problem List Items Addressed This Visit       Cardiology Problems   Aortic valve stenosis     Other   S/P AVR   Morbid obesity (HCC)   Other Visit Diagnoses       Coronary artery disease of native artery of native heart with stable angina pectoris (HCC)    -  Primary   Relevant Orders   EKG 12-Lead (Completed)     Poorly controlled type 2 diabetes mellitus (HCC)         Essential hypertension         Hyperlipidemia LDL goal <70          CAD with chronic stable angina CABG x2 2021, Currently with no symptoms of angina. No further workup at this time. Continue current medication regimen.  Diabetes type 2 with complications A1c running lower in the 7 range with weight loss Weight decreasing on Ozempic   Essential hypertension Blood pressure running low with weight loss Recommend he decrease his losartan  HCTZ in  half daily and monitor blood pressure at home Suggest he call us  with numbers  Aortic valve stenosis, status post AVR  bioprosthetic valve placement 2021 echocardiogram completed July 2022,  Echo July 2025 well-functioning aortic valve prosthesis  Hyperlipidemia Cholesterol at goal on Zetia  Crestor  Tricor   Vertigo Scheduled to see  ENT Meclizine  refilled  Carotid stenosis/PAD Referral made to vascular Stenosis of right carotid on CT scan neck   Signed, Velinda Lunger, M.D., Ph.D. [facility] [place], [place] [phone]

## 2024-01-30 ENCOUNTER — Ambulatory Visit: Attending: Cardiovascular Disease | Admitting: Cardiovascular Disease

## 2024-01-30 ENCOUNTER — Encounter: Payer: Self-pay | Admitting: Cardiovascular Disease

## 2024-01-30 VITALS — BP 90/52 | Ht 71.0 in | Wt 223.6 lb

## 2024-01-30 DIAGNOSIS — I350 Nonrheumatic aortic (valve) stenosis: Secondary | ICD-10-CM | POA: Diagnosis not present

## 2024-01-30 DIAGNOSIS — E785 Hyperlipidemia, unspecified: Secondary | ICD-10-CM

## 2024-01-30 DIAGNOSIS — E1165 Type 2 diabetes mellitus with hyperglycemia: Secondary | ICD-10-CM | POA: Diagnosis not present

## 2024-01-30 DIAGNOSIS — Z952 Presence of prosthetic heart valve: Secondary | ICD-10-CM

## 2024-01-30 DIAGNOSIS — I6529 Occlusion and stenosis of unspecified carotid artery: Secondary | ICD-10-CM

## 2024-01-30 DIAGNOSIS — I10 Essential (primary) hypertension: Secondary | ICD-10-CM

## 2024-01-30 DIAGNOSIS — I25118 Atherosclerotic heart disease of native coronary artery with other forms of angina pectoris: Secondary | ICD-10-CM

## 2024-01-30 MED ORDER — METOPROLOL TARTRATE 25 MG PO TABS: 25.0000 mg | ORAL_TABLET | Freq: Two times a day (BID) | ORAL | 3 refills | Status: AC

## 2024-01-30 MED ORDER — LOSARTAN POTASSIUM-HCTZ 50-12.5 MG PO TABS
0.5000 | ORAL_TABLET | Freq: Every day | ORAL | 3 refills | Status: AC
Start: 2024-01-30 — End: ?

## 2024-01-30 MED ORDER — MECLIZINE HCL 25 MG PO TABS: 25.0000 mg | ORAL_TABLET | Freq: Three times a day (TID) | ORAL | 1 refills | Status: DC | PRN

## 2024-01-30 NOTE — Patient Instructions (Addendum)
 Referral to vascular surgery in [place] for carotid stenosis  Medication Instructions:   Please cut the losartan  hydrochlorothiazide  in 1/2 daily (BP is low today)  If you need a refill on your cardiac medications before your next appointment, please call your pharmacy.   Lab work: No new labs needed  Testing/Procedures: No new testing needed  Follow-Up: At [facility], you and your health needs are our priority.  As part of our continuing mission to provide you with exceptional heart care, we have created designated Provider Care Teams.  These Care Teams include your primary Cardiologist (physician) and Advanced Practice Providers (APPs -  Physician Assistants and Nurse Practitioners) who all work together to provide you with the care you need, when you need it.  You will need a follow up appointment in 6 months, APP ok  Providers on your designated Care Team:   Lonni Meager, NP Bernardino Bring, PA-C Cadence [place], [place]  COVID-19 Vaccine Information can be found at: [email or web address] For questions related to vaccine distribution or appointments, please email [email or web address] or call [phone].

## 2024-02-03 ENCOUNTER — Other Ambulatory Visit: Payer: Self-pay | Admitting: Family Medicine

## 2024-02-03 DIAGNOSIS — E119 Type 2 diabetes mellitus without complications: Secondary | ICD-10-CM

## 2024-02-13 ENCOUNTER — Ambulatory Visit (INDEPENDENT_AMBULATORY_CARE_PROVIDER_SITE_OTHER): Payer: Self-pay | Admitting: Vascular Surgery

## 2024-02-13 ENCOUNTER — Encounter (INDEPENDENT_AMBULATORY_CARE_PROVIDER_SITE_OTHER): Payer: Self-pay | Admitting: Vascular Surgery

## 2024-02-13 VITALS — BP 93/57 | HR 91 | Ht 71.0 in | Wt 219.0 lb

## 2024-02-13 DIAGNOSIS — I672 Cerebral atherosclerosis: Secondary | ICD-10-CM

## 2024-02-13 DIAGNOSIS — I6523 Occlusion and stenosis of bilateral carotid arteries: Secondary | ICD-10-CM | POA: Diagnosis not present

## 2024-02-13 DIAGNOSIS — M1991 Primary osteoarthritis, unspecified site: Secondary | ICD-10-CM

## 2024-02-13 DIAGNOSIS — E119 Type 2 diabetes mellitus without complications: Secondary | ICD-10-CM | POA: Diagnosis not present

## 2024-02-13 DIAGNOSIS — I10 Essential (primary) hypertension: Secondary | ICD-10-CM | POA: Diagnosis not present

## 2024-02-13 NOTE — Progress Notes (Unsigned)
 MRN : [id number]  Jeffery West is a 68 y.o. ([birth year]) male who presents with chief complaint of check carotid arteries.  History of Present Illness:   The patient is seen for evaluation of carotid stenosis. The carotid stenosis was identified after CT angiogram was performed December 30, 2023.  The CT was obtained secondary to dizziness and vertigo-like symptoms, the symptoms were associated with some nausea.  The patient denies amaurosis fugax. There is no recent history of TIA symptoms or focal motor deficits (this is based on my history from the patient himself and cooperated by his wife). There is no prior documented CVA.  There is no history of migraine headaches. There is no history of seizures.  The patient is taking enteric-coated aspirin  81 mg daily.  No recent shortening of the patient's walking distance or new symptoms consistent with claudication.  No history of rest pain symptoms. No new ulcers or wounds of the lower extremities have occurred.  There is no history of DVT, PE or superficial thrombophlebitis. No recent episodes of angina or shortness of breath documented.  CT scan dated 12/30/2023 is reviewed by me personally with the patient and his wife.  It does show some mild atherosclerotic changes at the carotid bifurcation.  There is a moderate to severe stenosis in the distal right internal carotid artery within the cavernous segment.  There is mild atherosclerosis on the left at this location.  There is diffuse intracranial atherosclerotic changes noted as well.  Current Meds  Medication Sig   acetaminophen  (TYLENOL ) 500 MG tablet Take 1,000 mg by mouth every 6 (six) hours as needed for moderate pain or headache.   allopurinol  (ZYLOPRIM ) 300 MG tablet TAKE 1 TABLET BY MOUTH EVERY DAY   aspirin  EC 81 MG tablet Take 81 mg by mouth daily. Swallow whole.   Cholecalciferol  (VITAMIN D ) 2000 units CAPS Take 2,000 Units by mouth every evening.     cyclobenzaprine  (FLEXERIL ) 5 MG tablet Take 1 tablet (5 mg total) by mouth at bedtime.   empagliflozin  (JARDIANCE ) 25 MG TABS tablet Take 1 tablet (25 mg total) by mouth daily.   ezetimibe  (ZETIA ) 10 MG tablet Take 1 tablet (10 mg total) by mouth daily.   fenofibrate  (TRICOR ) 48 MG tablet TAKE 1 TABLET BY MOUTH EVERY DAY   fluticasone  (FLONASE ) 50 MCG/ACT nasal spray Place 1 spray into both nostrils daily.   glucose blood (ONETOUCH VERIO) test strip Use as instructed; BID   icosapent  Ethyl (VASCEPA ) 1 g capsule TAKE 2 CAPSULES BY MOUTH TWICE A DAY   Insulin  Pen Needle (BD PEN NEEDLE NANO 2ND GEN) 32G X 4 MM MISC USE AS DIRECTED   loratadine  (CLARITIN ) 10 MG tablet Take 1 tablet (10 mg total) by mouth daily.   losartan -hydrochlorothiazide  (HYZAAR) 50-12.5 MG tablet Take 0.5 tablets by mouth daily.   magnesium  oxide (MAG-OX) 400 (240 Mg) MG tablet TAKE 1 TABLET BY MOUTH TWICE A DAY   meclizine  (ANTIVERT ) 25 MG tablet Take 1 tablet (25 mg total) by mouth 3 (three) times daily as needed for dizziness.   metFORMIN  (GLUCOPHAGE ) 1000 MG tablet TAKE 1 TABLET (1,000 MG TOTAL) BY MOUTH TWICE A DAY WITH FOOD   metoprolol  tartrate (LOPRESSOR ) 25 MG tablet Take 1 tablet (25 mg total) by mouth 2 (two) times daily.   nystatin  cream (MYCOSTATIN ) APPLY TO AFFECTED AREA TWICE A  DAY   OneTouch Delica Lancets 30G MISC Use 3 times a day   oxybutynin  (DITROPAN ) 5 MG tablet TAKE 1 TABLET BY MOUTH 3 TIMES DAILY   predniSONE  (STERAPRED UNI-PAK 21 TAB) 10 MG (21) TBPK tablet Take by mouth.   rosuvastatin  (CRESTOR ) 5 MG tablet TAKE 1 TABLET BY MOUTH EVERY DAY   Semaglutide , 2 MG/DOSE, (OZEMPIC , 2 MG/DOSE,) 8 MG/3ML SOPN INJECT 2 MG AS DIRECTED ONCE A WEEK.   traMADol  (ULTRAM ) 50 MG tablet Take 50 mg by mouth every 6 (six) hours as needed.   TRESIBA  FLEXTOUCH 100 UNIT/ML FlexTouch Pen INJECT 30 UNITS SUBCUTANEOUSLY ONCE DAILY AT 10:00PM    Past Medical History:  Diagnosis Date   Coronary artery disease    Diabetes  mellitus without complication (HCC)    Dizziness 12/30/2023   Erectile dysfunction    Gout    Heart murmur    Hyperlipidemia    Hypertension    Obesity    Osteoarthritis    Vitamin D  deficiency     Past Surgical History:  Procedure Laterality Date   AORTIC VALVE REPLACEMENT N/A 11/26/2019   Procedure: AORTIC VALVE REPLACEMENT (AVR) USING INSPIRIS AORTIC VALVE SIZE ;  Surgeon: Lucas Dorise POUR, MD;  Location: [facility] OR;  Service: Open Heart Surgery;  Laterality: N/A;   CARDIAC CATHETERIZATION     COLONOSCOPY WITH PROPOFOL  N/A 08/24/2019   Procedure: COLONOSCOPY WITH PROPOFOL ;  Surgeon: Jinny Carmine, MD;  Location: [facility] ENDOSCOPY;  Service: Endoscopy;  Laterality: N/A;   CORONARY ARTERY BYPASS GRAFT N/A 11/26/2019   Procedure: CORONARY ARTERY BYPASS GRAFTING (CABG) X Two , using left internal mammary artery and right leg greater saphenous vein harvested endoscopically.;  Surgeon: Lucas Dorise POUR, MD;  Location: [facility] OR;  Service: Open Heart Surgery;  Laterality: N/A;   LEFT HEART CATH AND CORONARY ANGIOGRAPHY Left 10/19/2019   Procedure: LEFT HEART CATH AND CORONARY ANGIOGRAPHY;  Surgeon: Perla Evalene PARAS, MD;  Location: [facility] INVASIVE CV LAB;  Service: Cardiovascular;  Laterality: Left;   NO PAST SURGERIES     RIGHT HEART CATH N/A 10/19/2019   Procedure: RIGHT HEART CATH;  Surgeon: Perla Evalene PARAS, MD;  Location: [facility] INVASIVE CV LAB;  Service: Cardiovascular;  Laterality: N/A;   SHOULDER ARTHROSCOPY WITH OPEN ROTATOR CUFF REPAIR Right 07/26/2017   Procedure: SHOULDER ARTHROSCOPY WITH OPEN ROTATOR CUFF REPAIR,SUBACROMINAL DECOMPRESSION,DISTAL CLAVICLE EXCISION;  Surgeon: Marchia Drivers, MD;  Location: [facility] ORS;  Service: Orthopedics;  Laterality: Right;   TEE WITHOUT CARDIOVERSION N/A 11/26/2019   Procedure: TRANSESOPHAGEAL ECHOCARDIOGRAM (TEE);  Surgeon: Lucas Dorise POUR, MD;  Location: [facility] OR;  Service: Open Heart Surgery;  Laterality: N/A;    Social History Social History   Tobacco Use    Smoking status: Never   Smokeless tobacco: Never  Vaping Use   Vaping status: Never Used  Substance Use Topics   Alcohol use: Yes    Alcohol/week: 0.0 standard drinks of alcohol    Comment: social   Drug use: No    Family History Family History  Problem Relation Age of Onset   Asthma Mother    Cancer Father        possibly in liver and lung   Hypertension Brother     Allergies  Allergen Reactions   Penicillins Hives, Swelling and Other (See Comments)    Has patient had a PCN reaction causing immediate rash, facial/tongue/throat swelling, SOB or lightheadedness with hypotension: Unknown Has patient had a PCN reaction causing severe rash involving mucus membranes or skin necrosis:  No Has patient had a PCN reaction that required hospitalization: no Has patient had a PCN reaction occurring within the last 10 years: No If all of the above answers are NO, then may proceed with Cephalosporin use.      REVIEW OF SYSTEMS (Negative unless checked)  Constitutional: [] Weight loss  [] Fever  [] Chills Cardiac: [] Chest pain   [] Chest pressure   [] Palpitations   [] Shortness of breath when laying flat   [] Shortness of breath with exertion. Vascular:  [x] Pain in legs with walking   [] Pain in legs at rest  [] History of DVT   [] Phlebitis   [] Swelling in legs   [] Varicose veins   [] Non-healing ulcers Pulmonary:   [] Uses home oxygen   [] Productive cough   [] Hemoptysis   [] Wheeze  [] COPD   [] Asthma Neurologic:  [] Dizziness   [] Seizures   [] History of stroke   [] History of TIA  [] Aphasia   [] Vissual changes   [] Weakness or numbness in arm   [] Weakness or numbness in leg Musculoskeletal:   [] Joint swelling   [x] Joint pain   [] Low back pain Hematologic:  [] Easy bruising  [] Easy bleeding   [] Hypercoagulable state   [] Anemic Gastrointestinal:  [] Diarrhea   [] Vomiting  [] Gastroesophageal reflux/heartburn   [] Difficulty swallowing. Genitourinary:  [] Chronic kidney disease   [] Difficult urination   [] Frequent urination   [] Blood in urine Skin:  [] Rashes   [] Ulcers  Psychological:  [] History of anxiety   []  History of major depression.  Physical Examination  Vitals:   02/13/24 1424  BP: (!) 93/57  Pulse: 91  Weight: 219 lb (99.3 kg)  Height: 5' 11 (1.803 m)   Body mass index is 30.54 kg/m. Gen: WD/WN, NAD Head: [place]/AT, No temporalis wasting.  Ear/Nose/Throat: Hearing grossly intact, nares w/o erythema or drainage Eyes: PER, EOMI, sclera nonicteric.  Neck: Supple, no masses.  No bruit or JVD.  Pulmonary:  Good air movement, no audible wheezing, no use of accessory muscles.  Cardiac: RRR, normal S1, S2, no Murmurs. Vascular:  carotid bruit noted Vessel Right Left  Radial Palpable Palpable  Carotid  Palpable  Palpable  Gastrointestinal: soft, non-distended. No guarding/no peritoneal signs.  Musculoskeletal: M/S 5/5 throughout.  No visible deformity.  Neurologic: CN 2-12 intact. Pain and light touch intact in extremities.  Symmetrical.  Speech is fluent. Motor exam as listed above. Psychiatric: Judgment intact, Mood & affect appropriate for pt's clinical situation. Dermatologic: No rashes or ulcers noted.  No changes consistent with cellulitis.   CBC Lab Results  Component Value Date   WBC 10.6 (H) 12/30/2023   HGB 15.0 12/30/2023   HCT 44.2 12/30/2023   MCV 88.2 12/30/2023   PLT 123 (L) 12/30/2023    BMET    Component Value Date/Time   NA 137 12/30/2023 0923   NA 140 12/19/2023 0930   K 3.9 12/30/2023 0923   CL 103 12/30/2023 0923   CO2 23 12/30/2023 0923   GLUCOSE 209 (H) 12/30/2023 0923   BUN 24 (H) 12/30/2023 0923   BUN 23 12/19/2023 0930   CREATININE 1.14 12/30/2023 0923   CREATININE 0.77 05/23/2017 1222   CALCIUM  9.6 12/30/2023 0923   GFRNONAA >60 12/30/2023 0923   GFRNONAA 99 05/23/2017 1222   GFRAA 96 12/18/2019 0830   GFRAA 114 05/23/2017 1222   CrCl cannot be calculated (Patient's most recent lab result is older than the maximum 21 days  allowed.).  COAG Lab Results  Component Value Date   INR 1.2 11/26/2019   INR 1.0 11/22/2019  INR 0.91 07/19/2017    Radiology No results found.   Assessment/Plan 1. Bilateral carotid artery stenosis (Primary) Recommend:  Given the patient's asymptomatic subcritical stenosis no further invasive testing or surgery at this time.  Duplex ultrasound will be obtained in the near future to correlate with the recent CT scan.  Theoretically progression of his intracranial distal carotid stenosis could be identified on a duplex ultrasound by a change from a low resistance signal to a high resistance waveform.  Given this a study to correlate what we know here in the present is warranted so that in 6 months or a year we can have a legitimate comparison  Continue antiplatelet therapy as prescribed I will discuss with Dr. Rosslyn of neurosurgery whether the addition of Plavix is indicated at this time.  I will also discuss with Dr. Gollan that maintaining a slightly permissive blood pressure will be helpful and perfusing his brain.  Continue management of CAD, HTN and Hyperlipidemia Healthy heart diet,  encouraged exercise at least 4 times per week  Follow up in 2 months with duplex ultrasound and physical exam  - VAS US  CAROTID; Future  2. Intracranial atherosclerosis See above.  I will place an ambulatory referral to Dr. Janjua - Ambulatory referral to Neurosurgery  3. Primary hypertension Continue antihypertensive medications as already ordered, these medications have been reviewed and there are no changes at this time.  4. Type 2 diabetes mellitus without complication, without long-term current use of insulin  (HCC) Continue hypoglycemic medications as already ordered, these medications have been reviewed and there are no changes at this time.  Hgb A1C to be monitored as already arranged by primary service  5. Primary osteoarthritis, unspecified site Continue medications to treat  the patient's degenerative disease as already ordered, these medications have been reviewed and there are no changes at this time.  Continued activity and therapy was stressed. d   Cordella Shawl, MD  02/13/2024 2:39 PM

## 2024-02-14 DIAGNOSIS — I6529 Occlusion and stenosis of unspecified carotid artery: Secondary | ICD-10-CM | POA: Insufficient documentation

## 2024-02-14 DIAGNOSIS — I672 Cerebral atherosclerosis: Secondary | ICD-10-CM | POA: Insufficient documentation

## 2024-02-23 ENCOUNTER — Ambulatory Visit: Admitting: Neuroradiology

## 2024-02-23 ENCOUNTER — Encounter: Payer: Self-pay | Admitting: Neuroradiology

## 2024-02-23 VITALS — BP 100/66 | HR 89 | Ht 71.0 in | Wt 224.0 lb

## 2024-02-23 DIAGNOSIS — I6521 Occlusion and stenosis of right carotid artery: Secondary | ICD-10-CM

## 2024-02-23 DIAGNOSIS — I7090 Unspecified atherosclerosis: Secondary | ICD-10-CM

## 2024-02-23 DIAGNOSIS — Z952 Presence of prosthetic heart valve: Secondary | ICD-10-CM | POA: Diagnosis not present

## 2024-02-23 DIAGNOSIS — I672 Cerebral atherosclerosis: Secondary | ICD-10-CM

## 2024-02-23 NOTE — Progress Notes (Signed)
 I had the pleasure of meeting [name] and his wife in the office today.  Briefly, he had an episode of dizziness/vertigo which prompted neuroimaging.  He had a brain MRI, CTA and CT arteriogram of the head and neck on 12/30/2023.  I reviewed all of those studies.  The brain MRI shows no acute infarct.  The CT arteriogram shows calcified plaque in the petrous and cavernous segment of the right internal carotid artery.  I have asked him detailed questions about stroke, TIA and amaurosis fugax and he is asymptomatic.  He does have a history of atherosclerotic vascular disease.  He had a aortic valve replacement and bypass surgery in 2011.  He has type 2 diabetes, dyslipidemia and is on blood pressure medication.  He takes aspirin  for antiplatelet therapy.  Assessment:  Asymptomatic heavily calcified stenosis of the right internal carotid artery petrous/cavernous segment.  Recommendation:  I explained to the patient that this type of abnormality does not require surgical treatment unless it is symptomatic.  As he is having no stroke symptoms, the best course of action is to continue aspirin  and the medical treatment of his vascular risk factors.    We did discuss the common symptoms of TIA and stroke, especially as related to the right carotid artery, and he knows to seek immediate attention if he develops new symptoms.  Routine imaging follow-up is not necessary in the absence of stroke symptoms.

## 2024-03-11 ENCOUNTER — Other Ambulatory Visit: Payer: Self-pay | Admitting: Family Medicine

## 2024-03-12 ENCOUNTER — Telehealth: Payer: Self-pay | Admitting: Family Medicine

## 2024-03-12 NOTE — Telephone Encounter (Unsigned)
 Copied from CRM #[id number]. Topic: Clinical - Medication Question >> Mar 12, 2024 10:55 AM Tiffini S wrote: Reason for CRM: Patient was seen at ENT on 03/05/24- said the blood pressure readings to low- suggested that he lower the blood pressure medication and increase the water pill   Patient did submit a request for a medication refill to the pharmacy: Allopurinol  300 mg  Please call the patient back to medication adjustments at [phone].

## 2024-03-12 NOTE — Telephone Encounter (Signed)
 Requested Prescriptions  Pending Prescriptions Disp Refills   allopurinol  (ZYLOPRIM ) 300 MG tablet [Pharmacy Med Name: ALLOPURINOL  300 MG TABLET] 90 tablet 2    Sig: TAKE 1 TABLET BY MOUTH EVERY DAY     Endocrinology:  Gout Agents - allopurinol  Failed - 03/12/2024  4:08 PM      Failed - CBC within normal limits and completed in the last 12 months    WBC  Date Value Ref Range Status  12/30/2023 10.6 (H) 4.0 - 10.5 K/uL Final   RBC  Date Value Ref Range Status  12/30/2023 5.01 4.22 - 5.81 MIL/uL Final   Hemoglobin  Date Value Ref Range Status  12/30/2023 15.0 13.0 - 17.0 g/dL Final  93/69/7974 84.5 13.0 - 17.7 g/dL Final   HCT  Date Value Ref Range Status  12/30/2023 44.2 39.0 - 52.0 % Final   Hematocrit  Date Value Ref Range Status  12/19/2023 46.9 37.5 - 51.0 % Final   MCHC  Date Value Ref Range Status  12/30/2023 33.9 30.0 - 36.0 g/dL Final   [facility]  Date Value Ref Range Status  12/30/2023 29.9 26.0 - 34.0 pg Final   MCV  Date Value Ref Range Status  12/30/2023 88.2 80.0 - 100.0 fL Final  12/19/2023 92 79 - 97 fL Final   No results found for: PLTCOUNTKUC, LABPLAT, POCPLA RDW  Date Value Ref Range Status  12/30/2023 12.9 11.5 - 15.5 % Final  12/19/2023 13.1 11.6 - 15.4 % Final         Passed - Uric Acid in normal range and within 360 days    Uric Acid  Date Value Ref Range Status  12/19/2023 4.2 3.8 - 8.4 mg/dL Final    Comment:               Therapeutic target for gout patients: <6.0         Passed - Cr in normal range and within 360 days    Creat  Date Value Ref Range Status  05/23/2017 0.77 0.70 - 1.25 mg/dL Final    Comment:    For patients >14 years of age, the reference limit for Creatinine is approximately 13% higher for people identified as African-American. .    Creatinine, Ser  Date Value Ref Range Status  12/30/2023 1.14 0.61 - 1.24 mg/dL Final         Passed - Valid encounter within last 12 months    Recent Outpatient Visits            2 months ago Annual physical exam   [facility] Sharma Coyer, MD

## 2024-03-13 MED ORDER — ALLOPURINOL 300 MG PO TABS: 300.0000 mg | ORAL_TABLET | Freq: Every day | ORAL | 2 refills | Status: AC

## 2024-03-13 NOTE — Telephone Encounter (Signed)
 I would recommend evaluation for patient's blood pressure to safely recommend medication adjustments. Please schedule pt for next available OV pending pt schedule.   Allopurinol  has been refilled.

## 2024-03-13 NOTE — Telephone Encounter (Signed)
 LVM requesting patient call back to schedule. 3 openings tomorrow 03/14/2024 at 10 am, 2pm and 4pm. Okay for E2C2 to give message/ schedule patient for OV tomorrow if agreeable

## 2024-03-13 NOTE — Telephone Encounter (Signed)
 Copied from CRM [id number]. Topic: Appointments - Scheduling Inquiry for Clinic >> Mar 13, 2024  4:25 PM DeAngela L wrote: Reason for CRM: patient calling cause he did receive a message to schedule an appointment to change his blood pressure medication but the patient will keep his appointment with the ENT office this Thursday and then schedule with the office after her further discuss   Pt num [phone] (H)

## 2024-03-14 NOTE — Telephone Encounter (Signed)
 Reviewed. Ok with ENT follow up prior to BP follow up with me

## 2024-03-15 ENCOUNTER — Telehealth: Payer: Self-pay | Admitting: Cardiovascular Disease

## 2024-03-15 NOTE — Telephone Encounter (Signed)
   Pre-operative Risk Assessment    Patient Name: Jeffery West  DOB: [birth year] MRN: [id number]   Date of last office visit: 01/30/24 Date of next office visit: n/a   Request for Surgical Clearance    Procedure:  hearing loss, hydrochlorothiazide  25mg  qd  Date of Surgery:  Clearance TBD                                Surgeon:  Dr. Milissa Surgeon's Group or Practice Name:  [facility] Phone number:  [phone] Fax number:  [phone]   Type of Clearance Requested:   - Medical    Type of Anesthesia:  Not Indicated   Additional requests/questions:    Signed, Arsenio Celine GAILS   03/15/2024, 4:32 PM

## 2024-03-16 NOTE — Telephone Encounter (Signed)
 Dr. Gollan,  You saw this patient on 01/30/2024. Per protocol we request that you comment on his cardiac risk to proceed with 3 rounds of Transtympanic injections  since it has been less than 2 months since evaluated in the office. Please send your comment to P CV Pre-Op Pool.  Thank you, Lamarr Satterfield DNP, ANP, AACC.

## 2024-03-16 NOTE — Telephone Encounter (Signed)
 Please clarify the type of surgery.  What is listed is a diagnosis and medication. Thank you.

## 2024-03-16 NOTE — Telephone Encounter (Signed)
   Patient Name: Jeffery West  DOB: 05-17-1956 MRN: [id number]  Primary Cardiologist: Evalene Lunger, MD  Chart reviewed as part of pre-operative protocol coverage. Given past medical history and time since last visit, based on ACC/AHA guidelines, Jeffery West is at acceptable risk for the planned procedure without further cardiovascular testing.    Per: Dr. Gollan 03/16/2024 Acceptable risk for tympanic procedure with ENT Does not appear that there are medications to hold No further cardiac testing needed Thx TGollan  The patient was advised that if he develops new symptoms prior to surgery to contact our office to arrange for a follow-up visit, and he verbalized understanding.  I will route this recommendation to the requesting party via Epic fax function and remove from pre-op pool.  Please call with questions.  Lamarr Satterfield, NP 03/16/2024, 1:42 PM

## 2024-03-16 NOTE — Telephone Encounter (Signed)
 3 rounds of Transtympanic injections. Also called wife, she asked that we call back after 4:30 to schedule patient.

## 2024-03-19 ENCOUNTER — Encounter: Payer: Self-pay | Admitting: Family Medicine

## 2024-03-19 DIAGNOSIS — H905 Unspecified sensorineural hearing loss: Secondary | ICD-10-CM | POA: Insufficient documentation

## 2024-03-22 ENCOUNTER — Other Ambulatory Visit: Payer: Self-pay | Admitting: Family Medicine

## 2024-03-22 DIAGNOSIS — E1169 Type 2 diabetes mellitus with other specified complication: Secondary | ICD-10-CM

## 2024-03-28 NOTE — Telephone Encounter (Signed)
  Jeffery West is calling to follow up. She will send a new fax for clearance to add fluid pill for the patient

## 2024-03-28 NOTE — Telephone Encounter (Signed)
 [name] from ENT wants to get permission to give the patient HTZ 25 mg to the pt to help with fluid in ears. Surgeons office will check kidney function in 2 wks if okay for the patient to be on the medication.   [name] stated that it is okay to leave the okay or not on her voicemail. Can also fax the office with the okay or not for them to start the medication.  Will route to the Preop pool for the Preop APP to review.

## 2024-03-28 NOTE — Telephone Encounter (Signed)
 Per Lamarr Satterfield, NP :  Ok to give HCTZ if BP is > 110/60   Will update the surgeons office

## 2024-03-28 NOTE — Telephone Encounter (Signed)
 [facility] with ENT for clarification about sending new request for fluid pill. Also need to clarify if  ASA is needing to be held for this procedure.

## 2024-04-05 ENCOUNTER — Ambulatory Visit (INDEPENDENT_AMBULATORY_CARE_PROVIDER_SITE_OTHER): Admitting: Vascular Surgery

## 2024-04-05 ENCOUNTER — Ambulatory Visit (INDEPENDENT_AMBULATORY_CARE_PROVIDER_SITE_OTHER)

## 2024-04-05 DIAGNOSIS — I6523 Occlusion and stenosis of bilateral carotid arteries: Secondary | ICD-10-CM

## 2024-04-09 ENCOUNTER — Other Ambulatory Visit: Payer: Self-pay | Admitting: Family Medicine

## 2024-04-09 DIAGNOSIS — E1169 Type 2 diabetes mellitus with other specified complication: Secondary | ICD-10-CM

## 2024-04-19 ENCOUNTER — Ambulatory Visit: Admitting: Family Medicine

## 2024-04-19 ENCOUNTER — Encounter: Payer: Self-pay | Admitting: Family Medicine

## 2024-04-19 VITALS — BP 111/64 | HR 82 | Temp 98.1°F | Ht 71.0 in | Wt 225.6 lb

## 2024-04-19 DIAGNOSIS — J3089 Other allergic rhinitis: Secondary | ICD-10-CM

## 2024-04-19 DIAGNOSIS — E1159 Type 2 diabetes mellitus with other circulatory complications: Secondary | ICD-10-CM | POA: Diagnosis not present

## 2024-04-19 DIAGNOSIS — I6523 Occlusion and stenosis of bilateral carotid arteries: Secondary | ICD-10-CM

## 2024-04-19 DIAGNOSIS — Z23 Encounter for immunization: Secondary | ICD-10-CM | POA: Diagnosis not present

## 2024-04-19 DIAGNOSIS — H9042 Sensorineural hearing loss, unilateral, left ear, with unrestricted hearing on the contralateral side: Secondary | ICD-10-CM

## 2024-04-19 DIAGNOSIS — J4520 Mild intermittent asthma, uncomplicated: Secondary | ICD-10-CM

## 2024-04-19 DIAGNOSIS — E1169 Type 2 diabetes mellitus with other specified complication: Secondary | ICD-10-CM

## 2024-04-19 DIAGNOSIS — Z952 Presence of prosthetic heart valve: Secondary | ICD-10-CM

## 2024-04-19 DIAGNOSIS — E119 Type 2 diabetes mellitus without complications: Secondary | ICD-10-CM

## 2024-04-19 DIAGNOSIS — E66811 Obesity, class 1: Secondary | ICD-10-CM

## 2024-04-19 DIAGNOSIS — I152 Hypertension secondary to endocrine disorders: Secondary | ICD-10-CM

## 2024-04-19 DIAGNOSIS — N529 Male erectile dysfunction, unspecified: Secondary | ICD-10-CM

## 2024-04-19 DIAGNOSIS — M1A9XX0 Chronic gout, unspecified, without tophus (tophi): Secondary | ICD-10-CM

## 2024-04-19 DIAGNOSIS — E785 Hyperlipidemia, unspecified: Secondary | ICD-10-CM

## 2024-04-19 MED ORDER — HYDROCHLOROTHIAZIDE 25 MG PO TABS: 25.0000 mg | ORAL_TABLET | Freq: Every day | ORAL | 6 refills | Status: AC

## 2024-04-19 NOTE — Progress Notes (Signed)
 Established patient visit   Patient: Jeffery West   DOB: Feb 04, 1956   68 y.o. Male  MRN: [id number] Visit Date: 04/19/2024  Today's healthcare provider: Rockie Agent, MD   Chief Complaint  Patient presents with   Medical Management of Chronic Issues    Patient is present for follow up of chronic issues, patient reports doing well. Had some issues with dizziness and nausea and went to the ED for this. Seen at [facility] ENT and patient is believed to have meniere's disease.    Medication Problem    Patient would like to discuss losartan  hydrochlorothiazide , ENT recommended possibly switching to losartan  only due to meniere's being linked to sodium levels    Subjective     HPI     Medical Management of Chronic Issues    Additional comments: Patient is present for follow up of chronic issues, patient reports doing well. Had some issues with dizziness and nausea and went to the ED for this. Seen at [facility] and patient is believed to have meniere's disease.         Medication Problem    Additional comments: Patient would like to discuss losartan  hydrochlorothiazide , ENT recommended possibly switching to losartan  only due to meniere's being linked to sodium levels       Last edited by Cherry Chiquita HERO, CMA on 04/19/2024  3:30 PM.       Discussed the use of AI scribe software for clinical note transcription with the patient, who gave verbal consent to proceed.  History of Present Illness Jeffery West is a 68 year old male with aortic valve stenosis and diabetes who presents for chronic follow-up and medication management.  He has been experiencing hearing loss and dizziness, primarily affecting his left ear. Initially, he sought care for ear discomfort and dizziness, which led to a fall at work on July 11th, resulting in an emergency room visit for observation. A hearing test revealed diminished hearing in the left ear, and he received three injections into the  eardrum, the last of which was two weeks ago. These injections provided temporary relief, but he continues to experience diminished hearing when the right ear is plugged. He is scheduled for a follow-up with his ENT provider on November 10th.  He has a history of aortic valve stenosis and underwent aortic valve replacement. He also has a heart murmur and reports that his blood pressure readings have been low, with recent readings of 90/50 and 111/64.  He notes that his A1c might be elevated due to recent dietary indulgences during family celebrations and holidays. He has gained 5-6 pounds since his last visit but remains 50 pounds lighter than several years ago.  He experiences chronic gout and takes allopurinol  300 mg daily.  He reports occasional back discomfort due to two slipped discs diagnosed in 2016.  He has a history of carotid stenosis and recently underwent an ultrasound of his neck. He is awaiting consultation with his specialist to discuss the results.     Past Medical History:  Diagnosis Date   Coronary artery disease    Diabetes mellitus without complication (HCC)    Dizziness 12/30/2023   Erectile dysfunction    Gout    Heart murmur    Hyperlipidemia    Hypertension    Obesity    Osteoarthritis    Vitamin D  deficiency     Medications: Outpatient Medications Prior to Visit  Medication Sig   acetaminophen  (TYLENOL ) 500 MG  tablet Take 1,000 mg by mouth every 6 (six) hours as needed for moderate pain or headache.   albuterol  (VENTOLIN  HFA) 108 (90 Base) MCG/ACT inhaler INHALE 1 PUFF INTO THE LUNGS EVERY 6 HOURS AS NEEDED FOR WHEEZING OR SHORTNESS OF BREATH.   allopurinol  (ZYLOPRIM ) 300 MG tablet Take 1 tablet (300 mg total) by mouth daily.   aspirin  EC 81 MG tablet Take 81 mg by mouth daily. Swallow whole.   Cholecalciferol  (VITAMIN D ) 2000 units CAPS Take 2,000 Units by mouth every evening.    cyclobenzaprine  (FLEXERIL ) 5 MG tablet Take 1 tablet (5 mg total) by mouth  at bedtime.   ezetimibe  (ZETIA ) 10 MG tablet Take 1 tablet (10 mg total) by mouth daily.   fenofibrate  (TRICOR ) 48 MG tablet TAKE 1 TABLET BY MOUTH EVERY DAY   glucose blood (ONETOUCH VERIO) test strip Use as instructed; BID   icosapent  Ethyl (VASCEPA ) 1 g capsule TAKE 2 CAPSULES BY MOUTH TWICE A DAY   Insulin  Pen Needle (BD PEN NEEDLE NANO 2ND GEN) 32G X 4 MM MISC USE AS DIRECTED   JARDIANCE  25 MG TABS tablet TAKE 1 TABLET BY MOUTH ONCE DAILY   loratadine  (CLARITIN ) 10 MG tablet Take 1 tablet (10 mg total) by mouth daily.   losartan -hydrochlorothiazide  (HYZAAR) 50-12.5 MG tablet Take 0.5 tablets by mouth daily.   magnesium  oxide (MAG-OX) 400 (240 Mg) MG tablet TAKE 1 TABLET BY MOUTH TWICE A DAY   meclizine  (ANTIVERT ) 25 MG tablet Take 1 tablet (25 mg total) by mouth 3 (three) times daily as needed for dizziness.   metFORMIN  (GLUCOPHAGE ) 1000 MG tablet TAKE 1 TABLET (1,000 MG TOTAL) BY MOUTH TWICE A DAY WITH FOOD   metoprolol  tartrate (LOPRESSOR ) 25 MG tablet Take 1 tablet (25 mg total) by mouth 2 (two) times daily.   nystatin  cream (MYCOSTATIN ) APPLY TO AFFECTED AREA TWICE A DAY   OneTouch Delica Lancets 30G MISC Use 3 times a day   rosuvastatin  (CRESTOR ) 5 MG tablet TAKE 1 TABLET BY MOUTH EVERY DAY   Semaglutide , 2 MG/DOSE, (OZEMPIC , 2 MG/DOSE,) 8 MG/3ML SOPN INJECT 2 MG AS DIRECTED ONCE A WEEK.   traMADol  (ULTRAM ) 50 MG tablet Take 50 mg by mouth every 6 (six) hours as needed.   TRESIBA  FLEXTOUCH 100 UNIT/ML FlexTouch Pen INJECT 30 UNITS SUBCUTANEOUSLY ONCE DAILY AT 10:00PM   [DISCONTINUED] fluticasone  (FLONASE ) 50 MCG/ACT nasal spray Place 1 spray into both nostrils daily.   [DISCONTINUED] oxybutynin  (DITROPAN ) 5 MG tablet TAKE 1 TABLET BY MOUTH 3 TIMES DAILY   [DISCONTINUED] loratadine  (CLARITIN  REDITABS) 10 MG dissolvable tablet Take 10 mg by mouth daily. (Patient not taking: Reported on 02/23/2024)   [DISCONTINUED] predniSONE  (STERAPRED UNI-PAK 21 TAB) 10 MG (21) TBPK tablet Take by mouth.  (Patient not taking: Reported on 02/23/2024)   No facility-administered medications prior to visit.    Review of Systems  Last CBC Lab Results  Component Value Date   WBC 10.6 (H) 12/30/2023   HGB 15.0 12/30/2023   HCT 44.2 12/30/2023   MCV 88.2 12/30/2023   MCH 29.9 12/30/2023   RDW 12.9 12/30/2023   PLT 123 (L) 12/30/2023   Last metabolic panel Lab Results  Component Value Date   GLUCOSE 149 (H) 04/19/2024   NA 140 04/19/2024   K 4.2 04/19/2024   CL 101 04/19/2024   CO2 26 04/19/2024   BUN 22 04/19/2024   CREATININE 1.10 04/19/2024   GFRNONAA >60 12/30/2023   CALCIUM  9.9 04/19/2024   PHOS 3.9 02/24/2017  PROT 6.9 12/30/2023   ALBUMIN  4.2 12/30/2023   LABGLOB 2.4 04/11/2023   AGRATIO 1.8 11/25/2021   BILITOT 0.9 12/30/2023   ALKPHOS 43 12/30/2023   AST 23 12/30/2023   ALT 17 12/30/2023   ANIONGAP 11 12/30/2023   Last lipids Lab Results  Component Value Date   CHOL 119 12/19/2023   HDL 29 (L) 12/19/2023   LDLCALC 64 12/19/2023   TRIG 146 12/19/2023   CHOLHDL 4.1 12/19/2023   Last hemoglobin A1c Lab Results  Component Value Date   HGBA1C 7.2 (H) 04/19/2024   Last thyroid functions Lab Results  Component Value Date   TSH 2.140 11/25/2021   Last vitamin D  Lab Results  Component Value Date   VD25OH 56.2 12/19/2023   Last vitamin B12 and Folate No results found for: VITAMINB12, FOLATE      Objective    BP 111/64 (BP Location: Right Arm, Patient Position: Sitting, Cuff Size: Normal)   Pulse 82   Temp 98.1 F (36.7 C) (Oral)   Ht 5' 11 (1.803 m)   Wt 225 lb 9.6 oz (102.3 kg)   SpO2 98%   BMI 31.46 kg/m   BP Readings from Last 3 Encounters:  04/19/24 111/64  02/23/24 100/66  02/13/24 (!) 93/57   Wt Readings from Last 3 Encounters:  04/19/24 225 lb 9.6 oz (102.3 kg)  02/23/24 224 lb (101.6 kg)  02/13/24 219 lb (99.3 kg)        Physical Exam  Physical Exam VITALS: BP- 111/64 CHEST: Clear to auscultation bilaterally. No  wheezes or crackles. CARDIOVASCULAR: Heart with mild murmur. EXTREMITIES: Left toe with red spots, non-itchy. NEUROLOGICAL: Sensation symmetric and intact.    Results for orders placed or performed in visit on 04/19/24  Urine Albumin /Creatinine with ratio (send out) [LAB689]  Result Value Ref Range   Creatinine, Urine 66.3 Not Estab. mg/dL   Microalbumin, Urine <6.9 Not Estab. ug/mL   Microalb/Creat Ratio <5 0 - 29 mg/g creat  Hemoglobin A1c  Result Value Ref Range   Hgb A1c MFr Bld 7.2 (H) 4.8 - 5.6 %   Est. average glucose Bld gHb Est-mCnc 160 mg/dL  AFE1+ZHQM  Result Value Ref Range   Glucose 149 (H) 70 - 99 mg/dL   BUN 22 8 - 27 mg/dL   Creatinine, Ser 8.89 0.76 - 1.27 mg/dL   eGFR 74 >40 fO/fpw/8.26   BUN/Creatinine Ratio 20 10 - 24   Sodium 140 134 - 144 mmol/L   Potassium 4.2 3.5 - 5.2 mmol/L   Chloride 101 96 - 106 mmol/L   CO2 26 20 - 29 mmol/L   Calcium  9.9 8.6 - 10.2 mg/dL    Assessment & Plan     Problem List Items Addressed This Visit     AB (asthmatic bronchitis)    Chronic asthma Chronic asthma managed with albuterol  as needed. - Continue albuterol  as needed      Allergic rhinitis - Primary    Chronic allergic rhinitis Chronic allergic rhinitis managed with Claritin . - Continue Claritin  10 mg daily      Carotid stenosis    Carotid artery stenosis Carotid artery stenosis identified during hospitalization. Follow-up with vascular specialist and neurosurgeon indicated no immediate intervention required. - Follow up with vascular specialist as scheduled      Relevant Medications   hydrochlorothiazide  (HYDRODIURIL ) 25 MG tablet   Diabetes mellitus (HCC)   Type 2 diabetes mellitus, Chronic condition  Type 2 diabetes managed with multiple medications including metformin , Jardiance , and  semaglutide . Recent dietary indiscretions noted, likely affecting blood glucose control. A1c needs updating. - Order A1c test - Continue metformin  1000 mg twice  daily - Continue Jardiance  25 mg daily - Continue semaglutide  2 mg weekly - Continue Tresiba  30 units daily      Relevant Orders   Urine Albumin /Creatinine with ratio (send out) [LAB689] (Completed)   HM Diabetes Foot Exam (Completed)   Hemoglobin A1c (Completed)   ED (erectile dysfunction) of organic origin   Chronic erectile dysfunction Monitor symptoms, no current medications prescribed for this       Gout   Chronic gout Chronic gout managed with allopurinol . - Continue allopurinol  300 mg daily      Hyperlipidemia associated with type 2 diabetes mellitus (HCC)    Hyperlipidemia and chronic hypertriglyceridemia, chronic  Hyperlipidemia and hypertriglyceridemia managed with multiple medications including Zetia , Tricor , Vascepa , and Crestor . - Continue Zetia  10 mg daily - Continue Tricor  48 mg daily - Continue Vascepa  2 grams twice daily - Continue Crestor  5 mg daily      Relevant Medications   hydrochlorothiazide  (HYDRODIURIL ) 25 MG tablet   Hypertension associated with diabetes (HCC)   Chronic condition    Ongoing management of multiple chronic conditions including diabetes, hypertension, hyperlipidemia, and others. Blood pressure has been low, with recent readings as low as 90/50. Current medications include losartan , hydrochlorothiazide , metoprolol , and others. Discussion about adjusting medications to manage blood pressure and Meniere's symptoms. Decision to discontinue losartan  temporarily due to low blood pressure and to monitor the effects of hydrochlorothiazide  alone. -hold losartan  50 mg   - Continue hydrochlorothiazide  and increase from 12.5 mg  to 25mg  daily for benefit of meniere's as recommended by patient's ENT - Monitor blood pressure closely - Schedule follow-up before New Year's to reassess blood pressure and medication changes      Relevant Medications   hydrochlorothiazide  (HYDRODIURIL ) 25 MG tablet   Other Relevant Orders   BMP8+EGFR (Completed)    Obesity, Class I, BMI 30.0-34.9 (see actual BMI)    Obesity, Chronic  BMI 31.46 Obesity with recent weight gain of 5-6 pounds. Overall weight is 50 pounds less than several years ago. -recommend dietary management and physical activity as tolerated aiming for 150 mins per week       S/P AVR   Aortic valve stenosis, status post valve replacement Status post aortic valve replacement with residual heart murmur. Murmur is mild and expected post-surgery. - Continue aspirin  81 mg daily      Sensorineural hearing loss   Chronic sensorineural hearing loss, left ear Persistent hearing loss in the left ear, possibly related to Meniere's disease. Recent treatment with steroid injections into the eardrum, which provided temporary relief. Follow-up with ENT scheduled for November 10th. - Follow up with ENT on November 10th      Other Visit Diagnoses       Influenza vaccine needed       Relevant Orders   Flu vaccine HIGH DOSE PF(Fluzone Trivalent) (Completed)       Assessment and Plan Assessment & Plan     Degenerative arthritis Degenerative arthritis with occasional discomfort.    Chronic vitamin D  deficiency Chronic vitamin D  deficiency managed with supplementation. - Continue vitamin D  2000 IU daily   Tinea pedis, left foot Tinea pedis on the left foot with redness and itching. He has been using antifungal cream intermittently. - Recommend over-the-counter athlete's foot cream (Lamisil) twice daily     Return in about 6 weeks (around 05/31/2024).  Rockie Agent, MD  [facility], [place] [phone] (phone) [phone] (fax)  [facility]

## 2024-04-19 NOTE — Patient Instructions (Signed)
 To keep you healthy, please keep in mind the following health maintenance items that you are due for:   Health Maintenance Due  Topic Date Due   Influenza Vaccine  01/20/2024   Diabetic kidney evaluation - Urine ACR  04/10/2024   OPHTHALMOLOGY EXAM  04/03/2024   FOOT EXAM  04/05/2024     Best Wishes,   Dr. Lang

## 2024-04-20 LAB — MICROALBUMIN / CREATININE URINE RATIO
Creatinine, Urine: 66.3 mg/dL
Microalb/Creat Ratio: <5 mg/g{creat} (ref 0–29)
Microalbumin, Urine: <3 ug/mL

## 2024-04-20 LAB — BMP8+EGFR
BUN/Creatinine Ratio: 20 (ref 10–24)
BUN: 22 mg/dL (ref 8–27)
CO2: 26 mmol/L (ref 20–29)
Calcium: 9.9 mg/dL (ref 8.6–10.2)
Chloride: 101 mmol/L (ref 96–106)
Creatinine, Ser: 1.1 mg/dL (ref 0.76–1.27)
Glucose: 149 mg/dL — ABNORMAL HIGH (ref 70–99)
Potassium: 4.2 mmol/L (ref 3.5–5.2)
Sodium: 140 mmol/L (ref 134–144)
eGFR: 74 mL/min/1.73 (ref >59)

## 2024-04-20 LAB — HEMOGLOBIN A1C
Est. average glucose Bld gHb Est-mCnc: 160 mg/dL
Hgb A1c MFr Bld: 7.2 % — ABNORMAL HIGH (ref 4.8–5.6)

## 2024-04-22 ENCOUNTER — Ambulatory Visit: Payer: Self-pay | Admitting: Family Medicine

## 2024-04-23 NOTE — Assessment & Plan Note (Signed)
 Chronic condition    Ongoing management of multiple chronic conditions including diabetes, hypertension, hyperlipidemia, and others. Blood pressure has been low, with recent readings as low as 90/50. Current medications include losartan , hydrochlorothiazide , metoprolol , and others. Discussion about adjusting medications to manage blood pressure and Meniere's symptoms. Decision to discontinue losartan  temporarily due to low blood pressure and to monitor the effects of hydrochlorothiazide  alone. -hold losartan  50 mg   - Continue hydrochlorothiazide  and increase from 12.5 mg  to 25mg  daily for benefit of meniere's as recommended by patient's ENT - Monitor blood pressure closely - Schedule follow-up before New Year's to reassess blood pressure and medication changes

## 2024-04-23 NOTE — Assessment & Plan Note (Signed)
  Chronic allergic rhinitis Chronic allergic rhinitis managed with Claritin . - Continue Claritin  10 mg daily

## 2024-04-23 NOTE — Assessment & Plan Note (Signed)
  Carotid artery stenosis Carotid artery stenosis identified during hospitalization. Follow-up with vascular specialist and neurosurgeon indicated no immediate intervention required. - Follow up with vascular specialist as scheduled

## 2024-04-23 NOTE — Assessment & Plan Note (Signed)
  Obesity, Chronic  BMI 31.46 Obesity with recent weight gain of 5-6 pounds. Overall weight is 50 pounds less than several years ago. -recommend dietary management and physical activity as tolerated aiming for 150 mins per week

## 2024-04-23 NOTE — Assessment & Plan Note (Signed)
 Type 2 diabetes mellitus, Chronic condition  Type 2 diabetes managed with multiple medications including metformin , Jardiance , and semaglutide . Recent dietary indiscretions noted, likely affecting blood glucose control. A1c needs updating. - Order A1c test - Continue metformin  1000 mg twice daily - Continue Jardiance  25 mg daily - Continue semaglutide  2 mg weekly - Continue Tresiba  30 units daily

## 2024-04-23 NOTE — Assessment & Plan Note (Signed)
 Aortic valve stenosis, status post valve replacement Status post aortic valve replacement with residual heart murmur. Murmur is mild and expected post-surgery. - Continue aspirin  81 mg daily

## 2024-04-23 NOTE — Assessment & Plan Note (Signed)
  Chronic asthma Chronic asthma managed with albuterol  as needed. - Continue albuterol  as needed

## 2024-04-23 NOTE — Assessment & Plan Note (Signed)
 Chronic erectile dysfunction Monitor symptoms, no current medications prescribed for this

## 2024-04-23 NOTE — Assessment & Plan Note (Signed)
 Chronic gout Chronic gout managed with allopurinol . - Continue allopurinol  300 mg daily

## 2024-04-23 NOTE — Assessment & Plan Note (Signed)
 Chronic sensorineural hearing loss, left ear Persistent hearing loss in the left ear, possibly related to Meniere's disease. Recent treatment with steroid injections into the eardrum, which provided temporary relief. Follow-up with ENT scheduled for November 10th. - Follow up with ENT on November 10th

## 2024-04-23 NOTE — Assessment & Plan Note (Signed)
  Hyperlipidemia and chronic hypertriglyceridemia, chronic  Hyperlipidemia and hypertriglyceridemia managed with multiple medications including Zetia , Tricor , Vascepa , and Crestor . - Continue Zetia  10 mg daily - Continue Tricor  48 mg daily - Continue Vascepa  2 grams twice daily - Continue Crestor  5 mg daily

## 2024-04-25 NOTE — Telephone Encounter (Signed)
 3 attempts made to contact patient, no success. Results letter mailed to patient.

## 2024-04-26 ENCOUNTER — Ambulatory Visit (INDEPENDENT_AMBULATORY_CARE_PROVIDER_SITE_OTHER): Admitting: Vascular Surgery

## 2024-04-27 LAB — OPHTHALMOLOGY REPORT-SCANNED

## 2024-04-30 ENCOUNTER — Ambulatory Visit (INDEPENDENT_AMBULATORY_CARE_PROVIDER_SITE_OTHER): Admitting: Vascular Surgery

## 2024-04-30 ENCOUNTER — Encounter (INDEPENDENT_AMBULATORY_CARE_PROVIDER_SITE_OTHER): Payer: Self-pay | Admitting: Vascular Surgery

## 2024-04-30 VITALS — BP 108/70 | HR 87 | Resp 18 | Ht 71.0 in | Wt 223.0 lb

## 2024-04-30 DIAGNOSIS — I6523 Occlusion and stenosis of bilateral carotid arteries: Secondary | ICD-10-CM

## 2024-04-30 DIAGNOSIS — I672 Cerebral atherosclerosis: Secondary | ICD-10-CM | POA: Diagnosis not present

## 2024-04-30 DIAGNOSIS — E1169 Type 2 diabetes mellitus with other specified complication: Secondary | ICD-10-CM

## 2024-04-30 DIAGNOSIS — E119 Type 2 diabetes mellitus without complications: Secondary | ICD-10-CM | POA: Diagnosis not present

## 2024-04-30 DIAGNOSIS — E785 Hyperlipidemia, unspecified: Secondary | ICD-10-CM

## 2024-04-30 DIAGNOSIS — E1159 Type 2 diabetes mellitus with other circulatory complications: Secondary | ICD-10-CM

## 2024-04-30 DIAGNOSIS — I152 Hypertension secondary to endocrine disorders: Secondary | ICD-10-CM

## 2024-04-30 NOTE — Progress Notes (Signed)
 MRN : [id number]  Jeffery West is a 68 y.o. ([birth year]) male who presents with chief complaint of check carotid arteries.  History of Present Illness:   The patient is seen for follow up evaluation of carotid stenosis. The carotid stenosis followed by ultrasound.   The patient denies amaurosis fugax. There is no recent history of TIA symptoms or focal motor deficits. There is no prior documented CVA.  The patient is taking enteric-coated aspirin  81 mg daily.  There is no history of migraine headaches. There is no history of seizures.  No recent shortening of the patient's walking distance or new symptoms consistent with claudication.  No history of rest pain symptoms. No new ulcers or wounds of the lower extremities have occurred.  There is no history of DVT, PE or superficial thrombophlebitis. No documented recent episodes of angina or shortness of breath documented.   Carotid Duplex done today shows 1 to 39% stenosis of the bilateral internal carotid arteries at their origins.  Of note the end-diastolic velocity on the right is slightly lower compared to the left consistent with the known more distal right internal carotid artery stenosis Current Meds  Medication Sig   acetaminophen  (TYLENOL ) 500 MG tablet Take 1,000 mg by mouth every 6 (six) hours as needed for moderate pain or headache.   albuterol  (VENTOLIN  HFA) 108 (90 Base) MCG/ACT inhaler INHALE 1 PUFF INTO THE LUNGS EVERY 6 HOURS AS NEEDED FOR WHEEZING OR SHORTNESS OF BREATH.   allopurinol  (ZYLOPRIM ) 300 MG tablet Take 1 tablet (300 mg total) by mouth daily.   aspirin  EC 81 MG tablet Take 81 mg by mouth daily. Swallow whole.   Cholecalciferol  (VITAMIN D ) 2000 units CAPS Take 2,000 Units by mouth every evening.    cyclobenzaprine  (FLEXERIL ) 5 MG tablet Take 1 tablet (5 mg total) by mouth at bedtime.   ezetimibe  (ZETIA ) 10 MG tablet Take 1 tablet (10 mg total) by mouth daily.   fenofibrate  (TRICOR ) 48  MG tablet TAKE 1 TABLET BY MOUTH EVERY DAY   glucose blood (ONETOUCH VERIO) test strip Use as instructed; BID   hydrochlorothiazide  (HYDRODIURIL ) 25 MG tablet Take 1 tablet (25 mg total) by mouth daily.   icosapent  Ethyl (VASCEPA ) 1 g capsule TAKE 2 CAPSULES BY MOUTH TWICE A DAY   Insulin  Pen Needle (BD PEN NEEDLE NANO 2ND GEN) 32G X 4 MM MISC USE AS DIRECTED   JARDIANCE  25 MG TABS tablet TAKE 1 TABLET BY MOUTH ONCE DAILY   loratadine  (CLARITIN ) 10 MG tablet Take 1 tablet (10 mg total) by mouth daily.   magnesium  oxide (MAG-OX) 400 (240 Mg) MG tablet TAKE 1 TABLET BY MOUTH TWICE A DAY   meclizine  (ANTIVERT ) 25 MG tablet Take 1 tablet (25 mg total) by mouth 3 (three) times daily as needed for dizziness.   metFORMIN  (GLUCOPHAGE ) 1000 MG tablet TAKE 1 TABLET (1,000 MG TOTAL) BY MOUTH TWICE A DAY WITH FOOD   metoprolol  tartrate (LOPRESSOR ) 25 MG tablet Take 1 tablet (25 mg total) by mouth 2 (two) times daily.   nystatin  cream (MYCOSTATIN ) APPLY TO AFFECTED AREA TWICE A DAY   OneTouch Delica Lancets 30G MISC Use 3 times a day   rosuvastatin  (CRESTOR ) 5 MG tablet TAKE 1 TABLET BY MOUTH EVERY DAY   Semaglutide , 2 MG/DOSE, (OZEMPIC , 2 MG/DOSE,) 8 MG/3ML SOPN INJECT 2 MG AS DIRECTED ONCE  A WEEK.   traMADol  (ULTRAM ) 50 MG tablet Take 50 mg by mouth every 6 (six) hours as needed.   TRESIBA  FLEXTOUCH 100 UNIT/ML FlexTouch Pen INJECT 30 UNITS SUBCUTANEOUSLY ONCE DAILY AT 10:00PM    Past Medical History:  Diagnosis Date   Coronary artery disease    Diabetes mellitus without complication (HCC)    Dizziness 12/30/2023   Erectile dysfunction    Gout    Heart murmur    Hyperlipidemia    Hypertension    Obesity    Osteoarthritis    Vitamin D  deficiency     Past Surgical History:  Procedure Laterality Date   AORTIC VALVE REPLACEMENT N/A 11/26/2019   Procedure: AORTIC VALVE REPLACEMENT (AVR) USING INSPIRIS AORTIC VALVE SIZE ;  Surgeon: Lucas Dorise POUR, MD;  Location: [facility] OR;  Service: Open Heart  Surgery;  Laterality: N/A;   CARDIAC CATHETERIZATION     COLONOSCOPY WITH PROPOFOL  N/A 08/24/2019   Procedure: COLONOSCOPY WITH PROPOFOL ;  Surgeon: Jinny Carmine, MD;  Location: [facility] ENDOSCOPY;  Service: Endoscopy;  Laterality: N/A;   CORONARY ARTERY BYPASS GRAFT N/A 11/26/2019   Procedure: CORONARY ARTERY BYPASS GRAFTING (CABG) X Two , using left internal mammary artery and right leg greater saphenous vein harvested endoscopically.;  Surgeon: Lucas Dorise POUR, MD;  Location: [facility] OR;  Service: Open Heart Surgery;  Laterality: N/A;   LEFT HEART CATH AND CORONARY ANGIOGRAPHY Left 10/19/2019   Procedure: LEFT HEART CATH AND CORONARY ANGIOGRAPHY;  Surgeon: Perla Evalene PARAS, MD;  Location: [facility] INVASIVE CV LAB;  Service: Cardiovascular;  Laterality: Left;   NO PAST SURGERIES     RIGHT HEART CATH N/A 10/19/2019   Procedure: RIGHT HEART CATH;  Surgeon: Perla Evalene PARAS, MD;  Location: [facility] INVASIVE CV LAB;  Service: Cardiovascular;  Laterality: N/A;   SHOULDER ARTHROSCOPY WITH OPEN ROTATOR CUFF REPAIR Right 07/26/2017   Procedure: SHOULDER ARTHROSCOPY WITH OPEN ROTATOR CUFF REPAIR,SUBACROMINAL DECOMPRESSION,DISTAL CLAVICLE EXCISION;  Surgeon: Marchia Drivers, MD;  Location: [facility] ORS;  Service: Orthopedics;  Laterality: Right;   TEE WITHOUT CARDIOVERSION N/A 11/26/2019   Procedure: TRANSESOPHAGEAL ECHOCARDIOGRAM (TEE);  Surgeon: Lucas Dorise POUR, MD;  Location: [facility] OR;  Service: Open Heart Surgery;  Laterality: N/A;    Social History Social History   Tobacco Use   Smoking status: Never   Smokeless tobacco: Never  Vaping Use   Vaping status: Never Used  Substance Use Topics   Alcohol use: Yes    Alcohol/week: 0.0 standard drinks of alcohol    Comment: social   Drug use: No    Family History Family History  Problem Relation Age of Onset   Asthma Mother    Cancer Father        possibly in liver and lung   Hypertension Brother     Allergies  Allergen Reactions   Penicillins Hives, Swelling and  Other (See Comments)    Has patient had a PCN reaction causing immediate rash, facial/tongue/throat swelling, SOB or lightheadedness with hypotension: Unknown Has patient had a PCN reaction causing severe rash involving mucus membranes or skin necrosis: No Has patient had a PCN reaction that required hospitalization: no Has patient had a PCN reaction occurring within the last 10 years: No If all of the above answers are NO, then may proceed with Cephalosporin use.      REVIEW OF SYSTEMS (Negative unless checked)  Constitutional: [] Weight loss  [] Fever  [] Chills Cardiac: [] Chest pain   [] Chest pressure   [] Palpitations   [] Shortness of breath when laying  flat   [] Shortness of breath with exertion. Vascular:  [x] Pain in legs with walking   [] Pain in legs at rest  [] History of DVT   [] Phlebitis   [] Swelling in legs   [] Varicose veins   [] Non-healing ulcers Pulmonary:   [] Uses home oxygen   [] Productive cough   [] Hemoptysis   [] Wheeze  [] COPD   [] Asthma Neurologic:  [] Dizziness   [] Seizures   [] History of stroke   [] History of TIA  [] Aphasia   [] Vissual changes   [] Weakness or numbness in arm   [] Weakness or numbness in leg Musculoskeletal:   [] Joint swelling   [] Joint pain   [] Low back pain Hematologic:  [] Easy bruising  [] Easy bleeding   [] Hypercoagulable state   [] Anemic Gastrointestinal:  [] Diarrhea   [] Vomiting  [] Gastroesophageal reflux/heartburn   [] Difficulty swallowing. Genitourinary:  [] Chronic kidney disease   [] Difficult urination  [] Frequent urination   [] Blood in urine Skin:  [] Rashes   [] Ulcers  Psychological:  [] History of anxiety   []  History of major depression.  Physical Examination  There were no vitals filed for this visit. There is no height or weight on file to calculate BMI. Gen: WD/WN, NAD Head: [place]/AT, No temporalis wasting.  Ear/Nose/Throat: Hearing grossly intact, nares w/o erythema or drainage Eyes: PER, EOMI, sclera nonicteric.  Neck: Supple, no masses.  No  bruit or JVD.  Pulmonary:  Good air movement, no audible wheezing, no use of accessory muscles.  Cardiac: RRR, normal S1, S2, no Murmurs. Vascular:  carotid bruit noted Vessel Right Left  Radial Palpable Palpable  Carotid  Palpable  Palpable  Gastrointestinal: soft, non-distended. No guarding/no peritoneal signs.  Musculoskeletal: M/S 5/5 throughout.  No visible deformity.  Neurologic: CN 2-12 intact. Pain and light touch intact in extremities.  Symmetrical.  Speech is fluent. Motor exam as listed above. Psychiatric: Judgment intact, Mood & affect appropriate for pt's clinical situation. Dermatologic: No rashes or ulcers noted.  No changes consistent with cellulitis.   CBC Lab Results  Component Value Date   WBC 10.6 (H) 12/30/2023   HGB 15.0 12/30/2023   HCT 44.2 12/30/2023   MCV 88.2 12/30/2023   PLT 123 (L) 12/30/2023    BMET    Component Value Date/Time   NA 140 04/19/2024 1621   K 4.2 04/19/2024 1621   CL 101 04/19/2024 1621   CO2 26 04/19/2024 1621   GLUCOSE 149 (H) 04/19/2024 1621   GLUCOSE 209 (H) 12/30/2023 0923   BUN 22 04/19/2024 1621   CREATININE 1.10 04/19/2024 1621   CREATININE 0.77 05/23/2017 1222   CALCIUM  9.9 04/19/2024 1621   GFRNONAA >60 12/30/2023 0923   GFRNONAA 99 05/23/2017 1222   GFRAA 96 12/18/2019 0830   GFRAA 114 05/23/2017 1222   Estimated Creatinine Clearance: 79.4 mL/min (by C-G formula based on SCr of 1.1 mg/dL).  COAG Lab Results  Component Value Date   INR 1.2 11/26/2019   INR 1.0 11/22/2019   INR 0.91 07/19/2017    Radiology VAS US  CAROTID Result Date: 04/09/2024 Carotid Arterial Duplex Study Patient Name:  Jeffery West  Date of Exam:   04/05/2024 Medical Rec #: [id number]      Accession #:    [id number] Date of Birth: [birth year]      Patient Gender: M Patient Age:   52 years Exam Location:  [facility] Procedure:      VAS US  CAROTID Referring Phys: CORDELLA SHAWL  --------------------------------------------------------------------------------  Indications: Dizziness. Performing Technologist: Leafy Gibes RVS  Examination Guidelines:  A complete evaluation includes B-mode imaging, spectral Doppler, color Doppler, and power Doppler as needed of all accessible portions of each vessel. Bilateral testing is considered an integral part of a complete examination. Limited examinations for reoccurring indications may be performed as noted.  Right Carotid Findings: +----------+--------+--------+--------+------------------+--------+           PSV cm/sEDV cm/sStenosisPlaque DescriptionComments +----------+--------+--------+--------+------------------+--------+ CCA Prox  90      12                                         +----------+--------+--------+--------+------------------+--------+ CCA Mid   83      11                                         +----------+--------+--------+--------+------------------+--------+ CCA Distal73      13                                         +----------+--------+--------+--------+------------------+--------+ ICA Prox  46      13                                         +----------+--------+--------+--------+------------------+--------+ ICA Mid   63      16                                         +----------+--------+--------+--------+------------------+--------+ ICA Distal63      21                                         +----------+--------+--------+--------+------------------+--------+ ECA       155     11                                         +----------+--------+--------+--------+------------------+--------+ +----------+--------+-------+--------+-------------------+           PSV cm/sEDV cmsDescribeArm Pressure (mmHG) +----------+--------+-------+--------+-------------------+ Subclavian90      0                                   +----------+--------+-------+--------+-------------------+ +---------+--------+--+--------+ VertebralPSV cm/s38EDV cm/s +---------+--------+--+--------+  Left Carotid Findings: +----------+--------+--------+--------+------------------+--------+           PSV cm/sEDV cm/sStenosisPlaque DescriptionComments +----------+--------+--------+--------+------------------+--------+ CCA Prox  116     20                                         +----------+--------+--------+--------+------------------+--------+ CCA Mid   124     23                                         +----------+--------+--------+--------+------------------+--------+  CCA Distal99      25                                         +----------+--------+--------+--------+------------------+--------+ ICA Prox  76      24                                         +----------+--------+--------+--------+------------------+--------+ ICA Mid   65      21                                         +----------+--------+--------+--------+------------------+--------+ ICA Distal88      23                                         +----------+--------+--------+--------+------------------+--------+ ECA       105     11                                         +----------+--------+--------+--------+------------------+--------+ +----------+--------+--------+--------+-------------------+           PSV cm/sEDV cm/sDescribeArm Pressure (mmHG) +----------+--------+--------+--------+-------------------+ Subclavian187     0                                   +----------+--------+--------+--------+-------------------+ +---------+--------+--+--------+--+ VertebralPSV cm/s44EDV cm/s13 +---------+--------+--+--------+--+   Summary: Right Carotid: There is no evidence of stenosis in the right ICA. Left Carotid: There is no evidence of stenosis in the left ICA. Vertebrals:  Bilateral vertebral arteries demonstrate  antegrade flow. Subclavians: Normal flow hemodynamics were seen in bilateral subclavian              arteries. *See table(s) above for measurements and observations.  Electronically signed by Cordella Shawl MD on 04/09/2024 at 11:15:48 AM.    Final      Assessment/Plan 1. Bilateral carotid artery stenosis (Primary) Recommend:   Given the patient's asymptomatic subcritical stenosis no further invasive testing or surgery at this time.   Duplex ultrasound will be obtained in the near future to correlate with the recent CT scan.  Theoretically progression of his intracranial distal carotid stenosis could be identified on a duplex ultrasound by a change from a low resistance signal to a high resistance waveform.  Given this a study to correlate what we know here in the present is warranted so that in 6 months or a year we can have a legitimate comparison   Continue antiplatelet therapy as prescribed I will discuss with Dr. Rosslyn of neurosurgery whether the addition of Plavix is indicated at this time.  I will also discuss with Dr. Gollan that maintaining a slightly permissive blood pressure will be helpful and perfusing his brain.   Continue management of CAD, HTN and Hyperlipidemia Healthy heart diet,  encouraged exercise at least 4 times per week   Follow up in 6 months with duplex ultrasound and physical exam  - VAS US  CAROTID; Future  2. Intracranial atherosclerosis See above.  3. Hypertension associated with diabetes (HCC) Continue antihypertensive medications as already ordered, these medications have been reviewed and there are no changes at this time.  4. Type 2 diabetes mellitus without complication, without long-term current use of insulin  (HCC) Continue hypoglycemic medications as already ordered, these medications have been reviewed and there are no changes at this time.  Hgb A1C to be monitored as already arranged by primary service  5. Hyperlipidemia associated with type 2  diabetes mellitus (HCC) Continue statin as ordered and reviewed, no changes at this time    Cordella Shawl, MD  04/30/2024 1:34 PM

## 2024-05-03 ENCOUNTER — Telehealth: Payer: Self-pay | Admitting: Family Medicine

## 2024-05-03 ENCOUNTER — Other Ambulatory Visit: Payer: Self-pay | Admitting: Family Medicine

## 2024-05-03 DIAGNOSIS — E119 Type 2 diabetes mellitus without complications: Secondary | ICD-10-CM

## 2024-05-03 NOTE — Telephone Encounter (Unsigned)
 Copied from CRM #[id number]. Topic: Clinical - Medication Refill >> May 03, 2024  3:48 PM Tiffini S wrote: Medication: OneTouch Delica Lancets 30G MISC  Has the patient contacted their pharmacy? No (Agent: If no, request that the patient contact the pharmacy for the refill. If patient does not wish to contact the pharmacy document the reason why and proceed with request.) (Agent: If yes, when and what did the pharmacy advise?)  This is the patient's preferred pharmacy:  [id number] - [place], [place] - [address] [place] [address] Phone: [phone] Fax: [phone]   Is this the correct pharmacy for this prescription? Yes If no, delete pharmacy and type the correct one.   Has the prescription been filled recently? Yes  Is the patient out of the medication? No  Has the patient been seen for an appointment in the last year OR does the patient have an upcoming appointment? Yes  Can we respond through MyChart? No, please call the patient at [phone]  Agent: Please be advised that Rx refills may take up to 3 business days. We ask that you follow-up with your pharmacy.

## 2024-05-04 ENCOUNTER — Other Ambulatory Visit: Payer: Self-pay | Admitting: Family Medicine

## 2024-05-04 DIAGNOSIS — E119 Type 2 diabetes mellitus without complications: Secondary | ICD-10-CM

## 2024-05-04 MED ORDER — ONETOUCH DELICA PLUS LANCET30G MISC: 1.0000 | Freq: Three times a day (TID) | 11 refills | Status: AC

## 2024-05-04 NOTE — Telephone Encounter (Signed)
RX for lancets sent to pharmacy

## 2024-05-04 NOTE — Telephone Encounter (Signed)
 Rx for lancets sent to [facility]

## 2024-05-06 ENCOUNTER — Encounter (INDEPENDENT_AMBULATORY_CARE_PROVIDER_SITE_OTHER): Payer: Self-pay | Admitting: Vascular Surgery

## 2024-05-24 ENCOUNTER — Other Ambulatory Visit: Payer: Self-pay | Admitting: Family Medicine

## 2024-05-24 DIAGNOSIS — E119 Type 2 diabetes mellitus without complications: Secondary | ICD-10-CM

## 2024-06-04 ENCOUNTER — Ambulatory Visit: Admitting: Family Medicine

## 2024-06-04 ENCOUNTER — Encounter: Payer: Self-pay | Admitting: Family Medicine

## 2024-06-04 VITALS — BP 118/70 | HR 86 | Ht 71.0 in | Wt 221.8 lb

## 2024-06-04 DIAGNOSIS — E785 Hyperlipidemia, unspecified: Secondary | ICD-10-CM

## 2024-06-04 DIAGNOSIS — F17200 Nicotine dependence, unspecified, uncomplicated: Secondary | ICD-10-CM | POA: Diagnosis not present

## 2024-06-04 DIAGNOSIS — H9042 Sensorineural hearing loss, unilateral, left ear, with unrestricted hearing on the contralateral side: Secondary | ICD-10-CM

## 2024-06-04 DIAGNOSIS — Z7984 Long term (current) use of oral hypoglycemic drugs: Secondary | ICD-10-CM | POA: Diagnosis not present

## 2024-06-04 DIAGNOSIS — I152 Hypertension secondary to endocrine disorders: Secondary | ICD-10-CM

## 2024-06-04 DIAGNOSIS — E66811 Obesity, class 1: Secondary | ICD-10-CM

## 2024-06-04 DIAGNOSIS — E1169 Type 2 diabetes mellitus with other specified complication: Secondary | ICD-10-CM | POA: Diagnosis not present

## 2024-06-04 DIAGNOSIS — E1159 Type 2 diabetes mellitus with other circulatory complications: Secondary | ICD-10-CM

## 2024-06-04 DIAGNOSIS — E119 Type 2 diabetes mellitus without complications: Secondary | ICD-10-CM

## 2024-06-04 MED ORDER — TRESIBA FLEXTOUCH 100 UNIT/ML ~~LOC~~ SOPN: PEN_INJECTOR | SUBCUTANEOUS | 3 refills | Status: DC

## 2024-06-04 MED ORDER — ICOSAPENT ETHYL 1 G PO CAPS: 2.0000 g | ORAL_CAPSULE | Freq: Two times a day (BID) | ORAL | 3 refills | Status: AC

## 2024-06-04 MED ORDER — FENOFIBRATE 48 MG PO TABS: 48.0000 mg | ORAL_TABLET | Freq: Every day | ORAL | 1 refills | Status: AC

## 2024-06-04 MED ORDER — METFORMIN HCL 1000 MG PO TABS: ORAL_TABLET | ORAL | 3 refills | Status: AC

## 2024-06-04 NOTE — Assessment & Plan Note (Signed)
 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 Recent steroid injections through the eardrum have not improved symptoms. Reports feeling of fluid and pressure in the ear. Considering hearing aids and exploring options for affordable devices. Plans to follow up with ENT specialist, but appointment was postponed due to insurance issues. - Continue follow-up with ENT specialist. - Explore options for affordable hearing aids.

## 2024-06-04 NOTE — Assessment & Plan Note (Signed)
 Type 2 diabetes mellitus with hypertension and hyperlipidemia Chronic type 2 diabetes mellitus with associated hypertension and hyperlipidemia. Blood pressure is well-controlled at 118/70 mmHg. Recent blood sugar was 150 mg/dL, likely due to dietary indiscretions. A1c was 7.2% at last check. Current medications include Jardiance , semaglutide , metformin , and Tresiba . Tresiba  dose adjustment discussed to improve glycemic control. Hydrochlorothiazide  continued for blood pressure management, losartan  discontinued as per ENT recommendation for Meniere's disease. - Continue Jardiance  25 mg daily. - Continue semaglutide  2 mg weekly. - Continue metformin  1000 mg twice daily. - Adjusted Tresiba  to 32 units daily. - Continue hydrochlorothiazide  25 mg daily. - Discontinued losartan  50 mg. - Continue Vascepa  1 gram twice daily. - Continue fenofibrate  48 mg daily. - Continue Zetia  10 mg daily. - Continue aspirin  81 mg daily.

## 2024-06-04 NOTE — Progress Notes (Signed)
 Established patient visit   Patient: Jeffery West   DOB: 07-16-1955   68 y.o. Male  MRN: [id number] Visit Date: 06/04/2024  Today's healthcare provider: Rockie Agent, MD   Chief Complaint  Patient presents with   Medical Management of Chronic Issues    Patient is present for follow up with PCP. Patient reports doing well, BP readings at home have been normal    Subjective     HPI     Medical Management of Chronic Issues    Additional comments: Patient is present for follow up with PCP. Patient reports doing well, BP readings at home have been normal       Last edited by Cherry Chiquita HERO, CMA on 06/04/2024  1:01 PM.       Discussed the use of AI scribe software for clinical note transcription with the patient, who gave verbal consent to proceed.  History of Present Illness Jeffery West is a 68 year old male who presents for follow-up.  He is currently on hydrochlorothiazide  25 mg daily for hypertension, having discontinued losartan  50 mg. For hyperlipidemia, he continues to take Vascepa  2 grams twice daily, fenofibrate  48 mg daily, Zetia  10 mg daily, and Crestor  5 mg daily. He also takes aspirin  81 mg daily for hyperlipidemia and aortic valve stenosis.  For type two diabetes, he is on metformin  1000 mg twice daily, Jardiance  25 mg daily, and semaglutide  2 mg weekly. His blood sugar was 150 mg/dL today, which he attributes to recent dietary choices during family gatherings. He needs refills for his diabetes medications.  He has a history of Meniere's disease and ongoing issues with hearing loss. He has had three steroid injections through the eardrum, which have not yet resulted in improvement. He describes a sensation of pressure and fluid in his ear and is considering hearing aids. He plans to follow up with his ENT specialist soon.  He is a current smoker and has a BMI of 30, classifying him as having class one obesity. He mentions recent stress due to his  mother's passing, which has impacted his dietary habits.  No issues with sleep, although he notes difficulty hearing the alarm clock if lying on his good ear.     Past Medical History:  Diagnosis Date   Coronary artery disease    Diabetes mellitus without complication (HCC)    Dizziness 12/30/2023   Erectile dysfunction    Gout    Heart murmur    Hyperlipidemia    Hypertension    Obesity    Osteoarthritis    Vitamin D  deficiency     Medications: Show/hide medication list[1]  Review of Systems  Last CBC Lab Results  Component Value Date   WBC 10.6 (H) 12/30/2023   HGB 15.0 12/30/2023   HCT 44.2 12/30/2023   MCV 88.2 12/30/2023   MCH 29.9 12/30/2023   RDW 12.9 12/30/2023   PLT 123 (L) 12/30/2023   Last metabolic panel Lab Results  Component Value Date   GLUCOSE 149 (H) 04/19/2024   NA 140 04/19/2024   K 4.2 04/19/2024   CL 101 04/19/2024   CO2 26 04/19/2024   BUN 22 04/19/2024   CREATININE 1.10 04/19/2024   EGFR 74 04/19/2024   CALCIUM  9.9 04/19/2024   PHOS 3.9 02/24/2017   PROT 6.9 12/30/2023   ALBUMIN  4.2 12/30/2023   LABGLOB 2.4 04/11/2023   AGRATIO 1.8 11/25/2021   BILITOT 0.9 12/30/2023   ALKPHOS 43 12/30/2023  AST 23 12/30/2023   ALT 17 12/30/2023   ANIONGAP 11 12/30/2023   Last lipids Lab Results  Component Value Date   CHOL 119 12/19/2023   HDL 29 (L) 12/19/2023   LDLCALC 64 12/19/2023   TRIG 146 12/19/2023   CHOLHDL 4.1 12/19/2023   Last hemoglobin A1c Lab Results  Component Value Date   HGBA1C 7.2 (H) 04/19/2024   Last thyroid functions Lab Results  Component Value Date   TSH 2.140 11/25/2021   Last vitamin D  Lab Results  Component Value Date   VD25OH 56.2 12/19/2023   Last vitamin B12 and Folate No results found for: VITAMINB12, FOLATE      Objective    BP 118/70 (BP Location: Right Arm, Patient Position: Sitting, Cuff Size: Normal)   Pulse 86   Ht 5' 11 (1.803 m)   Wt 221 lb 12.8 oz (100.6 kg)   SpO2 99%    BMI 30.93 kg/m   BP Readings from Last 3 Encounters:  06/04/24 118/70  04/30/24 108/70  04/19/24 111/64   Wt Readings from Last 3 Encounters:  06/04/24 221 lb 12.8 oz (100.6 kg)  04/30/24 223 lb (101.2 kg)  04/19/24 225 lb 9.6 oz (102.3 kg)       Physical Exam Vitals reviewed.  Constitutional:      General: He is not in acute distress.    Appearance: Normal appearance. He is not ill-appearing.  HENT:     Left Ear: Decreased hearing noted.  Cardiovascular:     Rate and Rhythm: Normal rate and regular rhythm.  Pulmonary:     Effort: Pulmonary effort is normal. No respiratory distress.     Breath sounds: No wheezing, rhonchi or rales.  Neurological:     Mental Status: He is alert and oriented to person, place, and time.  Psychiatric:        Mood and Affect: Mood normal.        Behavior: Behavior normal.       No results found for any visits on 06/04/24.  Assessment & Plan     Problem List Items Addressed This Visit     Diabetes mellitus (HCC)   Type 2 diabetes mellitus with hypertension and hyperlipidemia Chronic type 2 diabetes mellitus with associated hypertension and hyperlipidemia. Blood pressure is well-controlled at 118/70 mmHg. Recent blood sugar was 150 mg/dL, likely due to dietary indiscretions. A1c was 7.2% at last check. Current medications include Jardiance , semaglutide , metformin , and Tresiba . Tresiba  dose adjustment discussed to improve glycemic control. Hydrochlorothiazide  continued for blood pressure management, losartan  discontinued as per ENT recommendation for Meniere's disease. - Continue Jardiance  25 mg daily. - Continue semaglutide  2 mg weekly. - Continue metformin  1000 mg twice daily. - Adjusted Tresiba  to 32 units daily. - Continue hydrochlorothiazide  25 mg daily. - Discontinued losartan  50 mg. - Continue Vascepa  1 gram twice daily. - Continue fenofibrate  48 mg daily. - Continue Zetia  10 mg daily. - Continue aspirin  81 mg daily.       Relevant Medications   metFORMIN  (GLUCOPHAGE ) 1000 MG tablet   insulin  degludec (TRESIBA  FLEXTOUCH) 100 UNIT/ML FlexTouch Pen   Hyperlipidemia associated with type 2 diabetes mellitus (HCC)    Hyperlipidemia and chronic hypertriglyceridemia, chronic  Hyperlipidemia and hypertriglyceridemia managed with multiple medications including Zetia , Tricor , Vascepa , and Crestor . - Continue Zetia  10 mg daily - Continue Tricor  48 mg daily - Continue Vascepa  2 grams twice daily - Continue Crestor  5 mg daily      Relevant Medications   fenofibrate  (TRICOR )  48 MG tablet   icosapent  Ethyl (VASCEPA ) 1 g capsule   metFORMIN  (GLUCOPHAGE ) 1000 MG tablet   insulin  degludec (TRESIBA  FLEXTOUCH) 100 UNIT/ML FlexTouch Pen   Hypertension associated with diabetes (HCC) - Primary   Chronic condition   - Continue hydrochlorothiazide  to 25mg  daily        Relevant Medications   fenofibrate  (TRICOR ) 48 MG tablet   icosapent  Ethyl (VASCEPA ) 1 g capsule   metFORMIN  (GLUCOPHAGE ) 1000 MG tablet   insulin  degludec (TRESIBA  FLEXTOUCH) 100 UNIT/ML FlexTouch Pen   Obesity, Class I, BMI 30.0-34.9 (see actual BMI)   Obesity, class 1 Chronic  Class 1 obesity with BMI of 30. Current management includes medication for associated conditions. - Continue current management and lifestyle modifications.      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 Recent steroid injections through the eardrum have not improved symptoms. Reports feeling of fluid and pressure in the ear. Considering hearing aids and exploring options for affordable devices. Plans to follow up with ENT specialist, but appointment was postponed due to insurance issues. - Continue follow-up with ENT specialist. - Explore options for affordable hearing aids.      Smoker   Encouraged smoking cessation        Assessment and Plan Assessment & Plan     General health  maintenance Discussion of upcoming colonoscopy screening. Previous notification was premature, and rescheduling is planned for next year. - Will schedule colonoscopy for next year.     Return in about 6 months (around 12/03/2024) for CPE.         Rockie Agent, MD  [facility] [phone] (phone) [phone] (fax)  [facility]     [1]  Outpatient Medications Prior to Visit  Medication Sig   acetaminophen  (TYLENOL ) 500 MG tablet Take 1,000 mg by mouth every 6 (six) hours as needed for moderate pain or headache.   albuterol  (VENTOLIN  HFA) 108 (90 Base) MCG/ACT inhaler INHALE 1 PUFF INTO THE LUNGS EVERY 6 HOURS AS NEEDED FOR WHEEZING OR SHORTNESS OF BREATH.   allopurinol  (ZYLOPRIM ) 300 MG tablet Take 1 tablet (300 mg total) by mouth daily.   aspirin  EC 81 MG tablet Take 81 mg by mouth daily. Swallow whole.   Cholecalciferol  (VITAMIN D ) 2000 units CAPS Take 2,000 Units by mouth every evening.    cyclobenzaprine  (FLEXERIL ) 5 MG tablet Take 1 tablet (5 mg total) by mouth at bedtime.   EMBECTA PEN NEEDLE NANO 2 GEN 32G X 4 MM MISC USE AS DIRECTED   ezetimibe  (ZETIA ) 10 MG tablet Take 1 tablet (10 mg total) by mouth daily.   glucose blood (ONETOUCH VERIO) test strip Use as instructed; BID   hydrochlorothiazide  (HYDRODIURIL ) 25 MG tablet Take 1 tablet (25 mg total) by mouth daily.   JARDIANCE  25 MG TABS tablet TAKE 1 TABLET BY MOUTH ONCE DAILY   Lancets (ONETOUCH DELICA PLUS LANCET30G) MISC 1 Lancet by Does not apply route 3 (three) times daily.   loratadine  (CLARITIN ) 10 MG tablet Take 1 tablet (10 mg total) by mouth daily.   magnesium  oxide (MAG-OX) 400 (240 Mg) MG tablet TAKE 1 TABLET BY MOUTH TWICE A DAY   meclizine  (ANTIVERT ) 25 MG tablet Take 1 tablet (25 mg total) by mouth 3 (three) times daily as needed for dizziness.   metoprolol  tartrate (LOPRESSOR ) 25 MG tablet Take 1 tablet (25 mg total) by mouth 2 (two) times daily.  nystatin  cream (MYCOSTATIN ) APPLY TO AFFECTED AREA TWICE A DAY   rosuvastatin  (CRESTOR ) 5 MG tablet TAKE 1 TABLET BY MOUTH EVERY DAY   Semaglutide , 2 MG/DOSE, (OZEMPIC , 2 MG/DOSE,) 8 MG/3ML SOPN INJECT 2 MG AS DIRECTED ONCE A WEEK.   traMADol  (ULTRAM ) 50 MG tablet Take 50 mg by mouth every 6 (six) hours as needed.   [DISCONTINUED] fenofibrate  (TRICOR ) 48 MG tablet TAKE 1 TABLET BY MOUTH EVERY DAY   [DISCONTINUED] icosapent  Ethyl (VASCEPA ) 1 g capsule TAKE 2 CAPSULES BY MOUTH TWICE A DAY   [DISCONTINUED] metFORMIN  (GLUCOPHAGE ) 1000 MG tablet TAKE 1 TABLET (1,000 MG TOTAL) BY MOUTH TWICE A DAY WITH FOOD   [DISCONTINUED] TRESIBA  FLEXTOUCH 100 UNIT/ML FlexTouch Pen INJECT 30 UNITS SUBCUTANEOUSLY ONCE DAILY AT 10:00PM   [DISCONTINUED] losartan -hydrochlorothiazide  (HYZAAR) 50-12.5 MG tablet Take 0.5 tablets by mouth daily.   No facility-administered medications prior to visit.

## 2024-06-04 NOTE — Assessment & Plan Note (Signed)
 Encouraged smoking cessation

## 2024-06-04 NOTE — Assessment & Plan Note (Signed)
 Obesity, class 1 Chronic  Class 1 obesity with BMI of 30. Current management includes medication for associated conditions. - Continue current management and lifestyle modifications.

## 2024-06-04 NOTE — Assessment & Plan Note (Signed)
 Chronic condition   - Continue hydrochlorothiazide  to 25mg  daily

## 2024-06-04 NOTE — Assessment & Plan Note (Signed)
  Hyperlipidemia and chronic hypertriglyceridemia, chronic  Hyperlipidemia and hypertriglyceridemia managed with multiple medications including Zetia , Tricor , Vascepa , and Crestor . - Continue Zetia  10 mg daily - Continue Tricor  48 mg daily - Continue Vascepa  2 grams twice daily - Continue Crestor  5 mg daily

## 2024-06-04 NOTE — Patient Instructions (Addendum)
 To keep you healthy, please keep in mind the following health maintenance items that you are due for:   Health Maintenance Due  Topic Date Due   Colonoscopy  08/23/2024     Best Wishes,   Dr. Lang

## 2024-06-07 ENCOUNTER — Other Ambulatory Visit (HOSPITAL_COMMUNITY): Payer: Self-pay

## 2024-06-11 ENCOUNTER — Other Ambulatory Visit: Payer: Self-pay | Admitting: Family Medicine

## 2024-06-11 DIAGNOSIS — E1169 Type 2 diabetes mellitus with other specified complication: Secondary | ICD-10-CM

## 2024-06-11 NOTE — Telephone Encounter (Signed)
 Copied from CRM #[id number]. Topic: Clinical - Prescription Issue >> Jun 11, 2024  3:07 PM Jeffery West wrote: Reason for CRM: Patients prescription for insulin  degludec (TRESIBA  FLEXTOUCH) 100 UNIT/ML FlexTouch Pen was supposed to be sent to: [facility] [place], [place] - [address] [place] [address] Phone: [phone] Fax: [phone]  Also states it was supposed to be written for 90 days, wants to confirm that was done.  Can be reached at [phone]

## 2024-06-13 ENCOUNTER — Other Ambulatory Visit: Payer: Self-pay | Admitting: Cardiovascular Disease

## 2024-06-13 MED ORDER — TRESIBA FLEXTOUCH 100 UNIT/ML ~~LOC~~ SOPN: PEN_INJECTOR | SUBCUTANEOUS | 3 refills | Status: AC

## 2024-06-13 NOTE — Telephone Encounter (Signed)
 Requested Prescriptions  Pending Prescriptions Disp Refills   insulin  degludec (TRESIBA  FLEXTOUCH) 100 UNIT/ML FlexTouch Pen 18 mL 3    Sig: INJECT 32 UNITS SUBCUTANEOUSLY ONCE DAILY AT 10:00PM     Endocrinology:  Diabetes - Insulins Passed - 06/13/2024 12:59 PM      Passed - HBA1C is between 0 and 7.9 and within 180 days    Hgb A1c MFr Bld  Date Value Ref Range Status  04/19/2024 7.2 (H) 4.8 - 5.6 % Final    Comment:             Prediabetes: 5.7 - 6.4          Diabetes: >6.4          Glycemic control for adults with diabetes: <7.0          Passed - Valid encounter within last 6 months    Recent Outpatient Visits           1 week ago Hypertension associated with diabetes (HCC)   [facility] Simmons-Robinson, [place], MD   1 month ago Non-seasonal allergic rhinitis due to other allergic trigger   [place] [facility] Simmons-Robinson, [place], MD   5 months ago Annual physical exam    [facility] [place], [place], MD

## 2024-06-18 ENCOUNTER — Other Ambulatory Visit: Payer: Self-pay | Admitting: Cardiovascular Disease

## 2024-06-20 NOTE — Telephone Encounter (Signed)
 Pt of Dr. Gollan. Does Dr. Gollan want to refill this RX? Please advise.

## 2024-07-04 ENCOUNTER — Other Ambulatory Visit: Payer: Self-pay | Admitting: Family Medicine

## 2024-07-04 DIAGNOSIS — E1169 Type 2 diabetes mellitus with other specified complication: Secondary | ICD-10-CM

## 2024-07-15 ENCOUNTER — Other Ambulatory Visit: Payer: Self-pay | Admitting: Family Medicine

## 2024-07-15 DIAGNOSIS — M545 Low back pain, unspecified: Secondary | ICD-10-CM

## 2024-07-16 NOTE — Telephone Encounter (Signed)
 Requested medication (s) are due for refill today: yes  Requested medication (s) are on the active medication list: yes  Last refill:  12/19/23  Future visit scheduled: yes  Notes to clinic:  Unable to refill per protocol, cannot delegate.      Requested Prescriptions  Pending Prescriptions Disp Refills   cyclobenzaprine  (FLEXERIL ) 5 MG tablet [Pharmacy Med Name: CYCLOBENZAPRINE  5 MG TABLET] 30 tablet 3    Sig: TAKE 1 TABLET BY MOUTH EVERYDAY AT BEDTIME     Not Delegated - Analgesics:  Muscle Relaxants Failed - 07/16/2024  3:28 PM      Failed - This refill cannot be delegated      Passed - Valid encounter within last 6 months    Recent Outpatient Visits           1 month ago Hypertension associated with diabetes (HCC)   [place] [facility] Simmons-Robinson, [place], MD   2 months ago Non-seasonal allergic rhinitis due to other allergic trigger   [place] [facility] Simmons-Robinson, [place], MD   7 months ago Annual physical exam   [facility] [place], Rockie, MD

## 2024-07-19 ENCOUNTER — Other Ambulatory Visit: Payer: Self-pay | Admitting: Family Medicine

## 2024-07-19 DIAGNOSIS — E785 Hyperlipidemia, unspecified: Secondary | ICD-10-CM

## 2024-10-29 ENCOUNTER — Ambulatory Visit (INDEPENDENT_AMBULATORY_CARE_PROVIDER_SITE_OTHER): Admitting: Vascular Surgery

## 2024-10-29 ENCOUNTER — Encounter (INDEPENDENT_AMBULATORY_CARE_PROVIDER_SITE_OTHER)

## 2024-12-24 ENCOUNTER — Encounter: Admitting: Family Medicine
